# Patient Record
Sex: Female | Born: 1945
Health system: Southern US, Community
[De-identification: ages and names within clinical notes are randomized; demographics above are authoritative.]

## PROBLEM LIST (undated history)

## (undated) DIAGNOSIS — E119 Type 2 diabetes mellitus without complications: Secondary | ICD-10-CM

## (undated) DIAGNOSIS — I951 Orthostatic hypotension: Secondary | ICD-10-CM

## (undated) DIAGNOSIS — E785 Hyperlipidemia, unspecified: Secondary | ICD-10-CM

## (undated) DIAGNOSIS — E079 Disorder of thyroid, unspecified: Secondary | ICD-10-CM

## (undated) HISTORY — PX: CARPAL TUNNEL RELEASE: SHX101

## (undated) HISTORY — DX: Orthostatic hypotension: I95.1

## (undated) HISTORY — PX: OTHER SURGICAL HISTORY: SHX169

## (undated) HISTORY — DX: Disorder of thyroid, unspecified: E07.9

## (undated) HISTORY — PX: GALLBLADDER SURGERY: SHX652

## (undated) HISTORY — PX: CERVICAL FUSION: SHX112

## (undated) HISTORY — DX: Hyperlipidemia, unspecified: E78.5

## (undated) HISTORY — PX: ABDOMINAL HYSTERECTOMY: SHX81

## (undated) HISTORY — PX: THYROIDECTOMY, PARTIAL: SHX18

## (undated) HISTORY — PX: CATARACT EXTRACTION, BILATERAL: SHX1313

---

## 2011-05-11 DIAGNOSIS — Z1212 Encounter for screening for malignant neoplasm of rectum: Secondary | ICD-10-CM | POA: Diagnosis not present

## 2011-05-11 DIAGNOSIS — A499 Bacterial infection, unspecified: Secondary | ICD-10-CM | POA: Diagnosis not present

## 2011-05-11 DIAGNOSIS — J209 Acute bronchitis, unspecified: Secondary | ICD-10-CM | POA: Diagnosis not present

## 2011-05-11 DIAGNOSIS — B9689 Other specified bacterial agents as the cause of diseases classified elsewhere: Secondary | ICD-10-CM | POA: Diagnosis not present

## 2011-05-11 DIAGNOSIS — J449 Chronic obstructive pulmonary disease, unspecified: Secondary | ICD-10-CM | POA: Diagnosis not present

## 2011-06-19 DIAGNOSIS — F339 Major depressive disorder, recurrent, unspecified: Secondary | ICD-10-CM | POA: Diagnosis not present

## 2011-07-17 DIAGNOSIS — F339 Major depressive disorder, recurrent, unspecified: Secondary | ICD-10-CM | POA: Diagnosis not present

## 2011-08-21 DIAGNOSIS — F339 Major depressive disorder, recurrent, unspecified: Secondary | ICD-10-CM | POA: Diagnosis not present

## 2011-08-21 DIAGNOSIS — B351 Tinea unguium: Secondary | ICD-10-CM | POA: Diagnosis not present

## 2011-08-21 DIAGNOSIS — M79609 Pain in unspecified limb: Secondary | ICD-10-CM | POA: Diagnosis not present

## 2011-10-02 DIAGNOSIS — F339 Major depressive disorder, recurrent, unspecified: Secondary | ICD-10-CM | POA: Diagnosis not present

## 2011-10-30 DIAGNOSIS — F339 Major depressive disorder, recurrent, unspecified: Secondary | ICD-10-CM | POA: Diagnosis not present

## 2011-11-27 DIAGNOSIS — F339 Major depressive disorder, recurrent, unspecified: Secondary | ICD-10-CM | POA: Diagnosis not present

## 2012-01-02 DIAGNOSIS — M25569 Pain in unspecified knee: Secondary | ICD-10-CM | POA: Diagnosis not present

## 2012-01-02 DIAGNOSIS — IMO0002 Reserved for concepts with insufficient information to code with codable children: Secondary | ICD-10-CM | POA: Diagnosis not present

## 2012-01-02 DIAGNOSIS — M1710 Unilateral primary osteoarthritis, unspecified knee: Secondary | ICD-10-CM | POA: Diagnosis not present

## 2012-01-02 DIAGNOSIS — M7120 Synovial cyst of popliteal space [Baker], unspecified knee: Secondary | ICD-10-CM | POA: Diagnosis not present

## 2012-01-05 DIAGNOSIS — M7120 Synovial cyst of popliteal space [Baker], unspecified knee: Secondary | ICD-10-CM | POA: Diagnosis not present

## 2012-01-11 DIAGNOSIS — M7120 Synovial cyst of popliteal space [Baker], unspecified knee: Secondary | ICD-10-CM | POA: Diagnosis not present

## 2012-02-20 DIAGNOSIS — T84498A Other mechanical complication of other internal orthopedic devices, implants and grafts, initial encounter: Secondary | ICD-10-CM | POA: Diagnosis not present

## 2012-02-20 DIAGNOSIS — Y939 Activity, unspecified: Secondary | ICD-10-CM | POA: Diagnosis not present

## 2012-02-20 DIAGNOSIS — M79609 Pain in unspecified limb: Secondary | ICD-10-CM | POA: Diagnosis not present

## 2012-02-20 DIAGNOSIS — M1710 Unilateral primary osteoarthritis, unspecified knee: Secondary | ICD-10-CM | POA: Diagnosis not present

## 2012-02-23 DIAGNOSIS — M1710 Unilateral primary osteoarthritis, unspecified knee: Secondary | ICD-10-CM | POA: Diagnosis not present

## 2012-02-26 DIAGNOSIS — Q742 Other congenital malformations of lower limb(s), including pelvic girdle: Secondary | ICD-10-CM | POA: Diagnosis not present

## 2012-02-26 DIAGNOSIS — M25579 Pain in unspecified ankle and joints of unspecified foot: Secondary | ICD-10-CM | POA: Diagnosis not present

## 2012-03-05 DIAGNOSIS — M25579 Pain in unspecified ankle and joints of unspecified foot: Secondary | ICD-10-CM | POA: Diagnosis not present

## 2012-03-07 DIAGNOSIS — E785 Hyperlipidemia, unspecified: Secondary | ICD-10-CM | POA: Diagnosis not present

## 2012-03-07 DIAGNOSIS — M79609 Pain in unspecified limb: Secondary | ICD-10-CM | POA: Diagnosis not present

## 2012-03-07 DIAGNOSIS — Z01810 Encounter for preprocedural cardiovascular examination: Secondary | ICD-10-CM | POA: Diagnosis not present

## 2012-03-07 DIAGNOSIS — I10 Essential (primary) hypertension: Secondary | ICD-10-CM | POA: Diagnosis not present

## 2012-03-07 DIAGNOSIS — R9431 Abnormal electrocardiogram [ECG] [EKG]: Secondary | ICD-10-CM | POA: Diagnosis not present

## 2012-03-07 DIAGNOSIS — Z01818 Encounter for other preprocedural examination: Secondary | ICD-10-CM | POA: Diagnosis not present

## 2012-03-11 DIAGNOSIS — M25579 Pain in unspecified ankle and joints of unspecified foot: Secondary | ICD-10-CM | POA: Diagnosis not present

## 2012-03-11 DIAGNOSIS — Z01818 Encounter for other preprocedural examination: Secondary | ICD-10-CM | POA: Diagnosis not present

## 2012-03-11 DIAGNOSIS — Z6841 Body Mass Index (BMI) 40.0 and over, adult: Secondary | ICD-10-CM | POA: Diagnosis not present

## 2012-03-14 DIAGNOSIS — Z472 Encounter for removal of internal fixation device: Secondary | ICD-10-CM | POA: Diagnosis not present

## 2012-03-14 DIAGNOSIS — M25579 Pain in unspecified ankle and joints of unspecified foot: Secondary | ICD-10-CM | POA: Diagnosis not present

## 2012-03-14 DIAGNOSIS — Z6839 Body mass index (BMI) 39.0-39.9, adult: Secondary | ICD-10-CM | POA: Diagnosis not present

## 2012-03-14 DIAGNOSIS — F17200 Nicotine dependence, unspecified, uncomplicated: Secondary | ICD-10-CM | POA: Diagnosis not present

## 2012-03-14 DIAGNOSIS — F3289 Other specified depressive episodes: Secondary | ICD-10-CM | POA: Diagnosis not present

## 2012-03-14 DIAGNOSIS — J45909 Unspecified asthma, uncomplicated: Secondary | ICD-10-CM | POA: Diagnosis not present

## 2012-03-14 DIAGNOSIS — F329 Major depressive disorder, single episode, unspecified: Secondary | ICD-10-CM | POA: Diagnosis not present

## 2012-03-14 DIAGNOSIS — I10 Essential (primary) hypertension: Secondary | ICD-10-CM | POA: Diagnosis not present

## 2012-03-14 DIAGNOSIS — E669 Obesity, unspecified: Secondary | ICD-10-CM | POA: Diagnosis not present

## 2012-03-14 DIAGNOSIS — T84498A Other mechanical complication of other internal orthopedic devices, implants and grafts, initial encounter: Secondary | ICD-10-CM | POA: Diagnosis not present

## 2012-03-14 DIAGNOSIS — J449 Chronic obstructive pulmonary disease, unspecified: Secondary | ICD-10-CM | POA: Diagnosis not present

## 2012-03-14 DIAGNOSIS — F411 Generalized anxiety disorder: Secondary | ICD-10-CM | POA: Diagnosis not present

## 2012-03-14 DIAGNOSIS — M79609 Pain in unspecified limb: Secondary | ICD-10-CM | POA: Diagnosis not present

## 2012-03-27 DIAGNOSIS — T84498A Other mechanical complication of other internal orthopedic devices, implants and grafts, initial encounter: Secondary | ICD-10-CM | POA: Diagnosis not present

## 2012-05-01 DIAGNOSIS — R42 Dizziness and giddiness: Secondary | ICD-10-CM | POA: Diagnosis not present

## 2012-05-01 DIAGNOSIS — K5732 Diverticulitis of large intestine without perforation or abscess without bleeding: Secondary | ICD-10-CM | POA: Diagnosis not present

## 2012-05-01 DIAGNOSIS — R262 Difficulty in walking, not elsewhere classified: Secondary | ICD-10-CM | POA: Diagnosis not present

## 2012-05-02 DIAGNOSIS — R1032 Left lower quadrant pain: Secondary | ICD-10-CM | POA: Diagnosis not present

## 2012-05-02 DIAGNOSIS — E785 Hyperlipidemia, unspecified: Secondary | ICD-10-CM | POA: Diagnosis not present

## 2012-05-02 DIAGNOSIS — R262 Difficulty in walking, not elsewhere classified: Secondary | ICD-10-CM | POA: Diagnosis not present

## 2012-05-02 DIAGNOSIS — J449 Chronic obstructive pulmonary disease, unspecified: Secondary | ICD-10-CM | POA: Diagnosis not present

## 2012-05-02 DIAGNOSIS — I10 Essential (primary) hypertension: Secondary | ICD-10-CM | POA: Diagnosis not present

## 2012-05-02 DIAGNOSIS — R42 Dizziness and giddiness: Secondary | ICD-10-CM | POA: Diagnosis not present

## 2012-05-02 DIAGNOSIS — G4733 Obstructive sleep apnea (adult) (pediatric): Secondary | ICD-10-CM | POA: Diagnosis not present

## 2012-05-02 DIAGNOSIS — E669 Obesity, unspecified: Secondary | ICD-10-CM | POA: Diagnosis not present

## 2012-05-02 DIAGNOSIS — Z6837 Body mass index (BMI) 37.0-37.9, adult: Secondary | ICD-10-CM | POA: Diagnosis not present

## 2012-05-02 DIAGNOSIS — K5732 Diverticulitis of large intestine without perforation or abscess without bleeding: Secondary | ICD-10-CM | POA: Diagnosis not present

## 2012-05-10 DIAGNOSIS — K5732 Diverticulitis of large intestine without perforation or abscess without bleeding: Secondary | ICD-10-CM | POA: Diagnosis not present

## 2012-05-10 DIAGNOSIS — Z8371 Family history of colonic polyps: Secondary | ICD-10-CM | POA: Diagnosis not present

## 2012-05-10 DIAGNOSIS — K5730 Diverticulosis of large intestine without perforation or abscess without bleeding: Secondary | ICD-10-CM | POA: Diagnosis not present

## 2012-05-20 DIAGNOSIS — F339 Major depressive disorder, recurrent, unspecified: Secondary | ICD-10-CM | POA: Diagnosis not present

## 2012-06-17 DIAGNOSIS — F339 Major depressive disorder, recurrent, unspecified: Secondary | ICD-10-CM | POA: Diagnosis not present

## 2012-07-12 DIAGNOSIS — Z860100 Personal history of colon polyps, unspecified: Secondary | ICD-10-CM | POA: Diagnosis not present

## 2012-07-12 DIAGNOSIS — E669 Obesity, unspecified: Secondary | ICD-10-CM | POA: Diagnosis not present

## 2012-07-12 DIAGNOSIS — Z800 Family history of malignant neoplasm of digestive organs: Secondary | ICD-10-CM | POA: Diagnosis not present

## 2012-07-12 DIAGNOSIS — K648 Other hemorrhoids: Secondary | ICD-10-CM | POA: Diagnosis not present

## 2012-07-12 DIAGNOSIS — K5730 Diverticulosis of large intestine without perforation or abscess without bleeding: Secondary | ICD-10-CM | POA: Diagnosis not present

## 2012-07-12 DIAGNOSIS — I10 Essential (primary) hypertension: Secondary | ICD-10-CM | POA: Diagnosis not present

## 2012-07-12 DIAGNOSIS — F17200 Nicotine dependence, unspecified, uncomplicated: Secondary | ICD-10-CM | POA: Diagnosis not present

## 2012-07-12 DIAGNOSIS — D126 Benign neoplasm of colon, unspecified: Secondary | ICD-10-CM | POA: Diagnosis not present

## 2012-07-12 DIAGNOSIS — G4730 Sleep apnea, unspecified: Secondary | ICD-10-CM | POA: Diagnosis not present

## 2012-07-12 DIAGNOSIS — K644 Residual hemorrhoidal skin tags: Secondary | ICD-10-CM | POA: Diagnosis not present

## 2012-07-12 DIAGNOSIS — J449 Chronic obstructive pulmonary disease, unspecified: Secondary | ICD-10-CM | POA: Diagnosis not present

## 2012-07-12 DIAGNOSIS — Z8371 Family history of colonic polyps: Secondary | ICD-10-CM | POA: Diagnosis not present

## 2012-07-12 DIAGNOSIS — Z6838 Body mass index (BMI) 38.0-38.9, adult: Secondary | ICD-10-CM | POA: Diagnosis not present

## 2012-07-12 DIAGNOSIS — M1990 Unspecified osteoarthritis, unspecified site: Secondary | ICD-10-CM | POA: Diagnosis not present

## 2012-07-29 DIAGNOSIS — F339 Major depressive disorder, recurrent, unspecified: Secondary | ICD-10-CM | POA: Diagnosis not present

## 2012-08-16 DIAGNOSIS — K5730 Diverticulosis of large intestine without perforation or abscess without bleeding: Secondary | ICD-10-CM | POA: Diagnosis not present

## 2012-08-16 DIAGNOSIS — K649 Unspecified hemorrhoids: Secondary | ICD-10-CM | POA: Diagnosis not present

## 2012-08-16 DIAGNOSIS — D126 Benign neoplasm of colon, unspecified: Secondary | ICD-10-CM | POA: Diagnosis not present

## 2012-08-26 DIAGNOSIS — F339 Major depressive disorder, recurrent, unspecified: Secondary | ICD-10-CM | POA: Diagnosis not present

## 2012-09-30 DIAGNOSIS — F339 Major depressive disorder, recurrent, unspecified: Secondary | ICD-10-CM | POA: Diagnosis not present

## 2012-10-28 DIAGNOSIS — K5732 Diverticulitis of large intestine without perforation or abscess without bleeding: Secondary | ICD-10-CM | POA: Diagnosis not present

## 2012-10-28 DIAGNOSIS — R1032 Left lower quadrant pain: Secondary | ICD-10-CM | POA: Diagnosis not present

## 2012-10-28 DIAGNOSIS — R3129 Other microscopic hematuria: Secondary | ICD-10-CM | POA: Diagnosis not present

## 2012-10-28 DIAGNOSIS — I10 Essential (primary) hypertension: Secondary | ICD-10-CM | POA: Diagnosis not present

## 2012-11-25 DIAGNOSIS — F339 Major depressive disorder, recurrent, unspecified: Secondary | ICD-10-CM | POA: Diagnosis not present

## 2012-12-30 DIAGNOSIS — F339 Major depressive disorder, recurrent, unspecified: Secondary | ICD-10-CM | POA: Diagnosis not present

## 2013-01-19 DIAGNOSIS — M25529 Pain in unspecified elbow: Secondary | ICD-10-CM | POA: Diagnosis not present

## 2013-01-19 DIAGNOSIS — IMO0001 Reserved for inherently not codable concepts without codable children: Secondary | ICD-10-CM | POA: Diagnosis not present

## 2013-01-27 DIAGNOSIS — F339 Major depressive disorder, recurrent, unspecified: Secondary | ICD-10-CM | POA: Diagnosis not present

## 2013-02-12 DIAGNOSIS — Z23 Encounter for immunization: Secondary | ICD-10-CM | POA: Diagnosis not present

## 2013-02-24 DIAGNOSIS — F339 Major depressive disorder, recurrent, unspecified: Secondary | ICD-10-CM | POA: Diagnosis not present

## 2013-03-31 DIAGNOSIS — F339 Major depressive disorder, recurrent, unspecified: Secondary | ICD-10-CM | POA: Diagnosis not present

## 2013-05-13 DIAGNOSIS — Y999 Unspecified external cause status: Secondary | ICD-10-CM | POA: Diagnosis not present

## 2013-05-13 DIAGNOSIS — W010XXA Fall on same level from slipping, tripping and stumbling without subsequent striking against object, initial encounter: Secondary | ICD-10-CM | POA: Diagnosis not present

## 2013-05-13 DIAGNOSIS — F17200 Nicotine dependence, unspecified, uncomplicated: Secondary | ICD-10-CM | POA: Diagnosis not present

## 2013-05-13 DIAGNOSIS — S6390XA Sprain of unspecified part of unspecified wrist and hand, initial encounter: Secondary | ICD-10-CM | POA: Diagnosis not present

## 2013-05-14 DIAGNOSIS — S6390XA Sprain of unspecified part of unspecified wrist and hand, initial encounter: Secondary | ICD-10-CM | POA: Diagnosis not present

## 2013-07-21 DIAGNOSIS — F339 Major depressive disorder, recurrent, unspecified: Secondary | ICD-10-CM | POA: Diagnosis not present

## 2013-08-25 DIAGNOSIS — F339 Major depressive disorder, recurrent, unspecified: Secondary | ICD-10-CM | POA: Diagnosis not present

## 2013-09-02 DIAGNOSIS — H34219 Partial retinal artery occlusion, unspecified eye: Secondary | ICD-10-CM | POA: Diagnosis not present

## 2013-09-02 DIAGNOSIS — H2510 Age-related nuclear cataract, unspecified eye: Secondary | ICD-10-CM | POA: Diagnosis not present

## 2013-09-02 DIAGNOSIS — H353190 Nonexudative age-related macular degeneration, unspecified eye, stage unspecified: Secondary | ICD-10-CM | POA: Diagnosis not present

## 2013-09-04 DIAGNOSIS — F3289 Other specified depressive episodes: Secondary | ICD-10-CM | POA: Diagnosis not present

## 2013-09-04 DIAGNOSIS — I2510 Atherosclerotic heart disease of native coronary artery without angina pectoris: Secondary | ICD-10-CM | POA: Diagnosis not present

## 2013-09-04 DIAGNOSIS — IMO0002 Reserved for concepts with insufficient information to code with codable children: Secondary | ICD-10-CM | POA: Diagnosis not present

## 2013-09-04 DIAGNOSIS — F17200 Nicotine dependence, unspecified, uncomplicated: Secondary | ICD-10-CM | POA: Diagnosis not present

## 2013-09-04 DIAGNOSIS — M25579 Pain in unspecified ankle and joints of unspecified foot: Secondary | ICD-10-CM | POA: Diagnosis not present

## 2013-09-04 DIAGNOSIS — E041 Nontoxic single thyroid nodule: Secondary | ICD-10-CM | POA: Diagnosis not present

## 2013-09-04 DIAGNOSIS — F329 Major depressive disorder, single episode, unspecified: Secondary | ICD-10-CM | POA: Diagnosis not present

## 2013-09-04 DIAGNOSIS — F411 Generalized anxiety disorder: Secondary | ICD-10-CM | POA: Diagnosis not present

## 2013-09-04 DIAGNOSIS — J449 Chronic obstructive pulmonary disease, unspecified: Secondary | ICD-10-CM | POA: Diagnosis not present

## 2013-09-06 DIAGNOSIS — I2510 Atherosclerotic heart disease of native coronary artery without angina pectoris: Secondary | ICD-10-CM | POA: Diagnosis not present

## 2013-09-06 DIAGNOSIS — Z833 Family history of diabetes mellitus: Secondary | ICD-10-CM | POA: Diagnosis not present

## 2013-09-06 DIAGNOSIS — Z0000 Encounter for general adult medical examination without abnormal findings: Secondary | ICD-10-CM | POA: Diagnosis not present

## 2013-09-06 DIAGNOSIS — E041 Nontoxic single thyroid nodule: Secondary | ICD-10-CM | POA: Diagnosis not present

## 2013-09-06 DIAGNOSIS — R7989 Other specified abnormal findings of blood chemistry: Secondary | ICD-10-CM | POA: Diagnosis not present

## 2013-09-08 DIAGNOSIS — H34219 Partial retinal artery occlusion, unspecified eye: Secondary | ICD-10-CM | POA: Diagnosis not present

## 2013-09-09 DIAGNOSIS — E783 Hyperchylomicronemia: Secondary | ICD-10-CM | POA: Diagnosis not present

## 2013-09-09 DIAGNOSIS — J449 Chronic obstructive pulmonary disease, unspecified: Secondary | ICD-10-CM | POA: Diagnosis not present

## 2013-09-09 DIAGNOSIS — R0602 Shortness of breath: Secondary | ICD-10-CM | POA: Diagnosis not present

## 2013-09-09 DIAGNOSIS — R002 Palpitations: Secondary | ICD-10-CM | POA: Diagnosis not present

## 2013-09-17 DIAGNOSIS — I2510 Atherosclerotic heart disease of native coronary artery without angina pectoris: Secondary | ICD-10-CM | POA: Diagnosis not present

## 2013-09-19 DIAGNOSIS — R0602 Shortness of breath: Secondary | ICD-10-CM | POA: Diagnosis not present

## 2013-09-22 DIAGNOSIS — F339 Major depressive disorder, recurrent, unspecified: Secondary | ICD-10-CM | POA: Diagnosis not present

## 2013-09-25 DIAGNOSIS — R0602 Shortness of breath: Secondary | ICD-10-CM | POA: Diagnosis not present

## 2013-09-25 DIAGNOSIS — E669 Obesity, unspecified: Secondary | ICD-10-CM | POA: Diagnosis not present

## 2013-09-25 DIAGNOSIS — J449 Chronic obstructive pulmonary disease, unspecified: Secondary | ICD-10-CM | POA: Diagnosis not present

## 2013-09-30 DIAGNOSIS — F411 Generalized anxiety disorder: Secondary | ICD-10-CM | POA: Diagnosis not present

## 2013-09-30 DIAGNOSIS — F3289 Other specified depressive episodes: Secondary | ICD-10-CM | POA: Diagnosis not present

## 2013-09-30 DIAGNOSIS — E041 Nontoxic single thyroid nodule: Secondary | ICD-10-CM | POA: Diagnosis not present

## 2013-09-30 DIAGNOSIS — I2510 Atherosclerotic heart disease of native coronary artery without angina pectoris: Secondary | ICD-10-CM | POA: Diagnosis not present

## 2013-09-30 DIAGNOSIS — F329 Major depressive disorder, single episode, unspecified: Secondary | ICD-10-CM | POA: Diagnosis not present

## 2013-09-30 DIAGNOSIS — Z0000 Encounter for general adult medical examination without abnormal findings: Secondary | ICD-10-CM | POA: Diagnosis not present

## 2013-09-30 DIAGNOSIS — H34219 Partial retinal artery occlusion, unspecified eye: Secondary | ICD-10-CM | POA: Diagnosis not present

## 2013-09-30 DIAGNOSIS — F17200 Nicotine dependence, unspecified, uncomplicated: Secondary | ICD-10-CM | POA: Diagnosis not present

## 2013-09-30 DIAGNOSIS — J449 Chronic obstructive pulmonary disease, unspecified: Secondary | ICD-10-CM | POA: Diagnosis not present

## 2013-10-02 DIAGNOSIS — Z1231 Encounter for screening mammogram for malignant neoplasm of breast: Secondary | ICD-10-CM | POA: Diagnosis not present

## 2013-10-06 DIAGNOSIS — E1149 Type 2 diabetes mellitus with other diabetic neurological complication: Secondary | ICD-10-CM | POA: Diagnosis not present

## 2013-10-06 DIAGNOSIS — B351 Tinea unguium: Secondary | ICD-10-CM | POA: Diagnosis not present

## 2013-10-20 DIAGNOSIS — F339 Major depressive disorder, recurrent, unspecified: Secondary | ICD-10-CM | POA: Diagnosis not present

## 2013-11-17 DIAGNOSIS — F339 Major depressive disorder, recurrent, unspecified: Secondary | ICD-10-CM | POA: Diagnosis not present

## 2013-12-03 DIAGNOSIS — H2510 Age-related nuclear cataract, unspecified eye: Secondary | ICD-10-CM | POA: Diagnosis not present

## 2013-12-08 DIAGNOSIS — B351 Tinea unguium: Secondary | ICD-10-CM | POA: Diagnosis not present

## 2013-12-08 DIAGNOSIS — E1149 Type 2 diabetes mellitus with other diabetic neurological complication: Secondary | ICD-10-CM | POA: Diagnosis not present

## 2013-12-15 DIAGNOSIS — F339 Major depressive disorder, recurrent, unspecified: Secondary | ICD-10-CM | POA: Diagnosis not present

## 2013-12-20 DIAGNOSIS — S8010XA Contusion of unspecified lower leg, initial encounter: Secondary | ICD-10-CM | POA: Diagnosis not present

## 2013-12-20 DIAGNOSIS — F17200 Nicotine dependence, unspecified, uncomplicated: Secondary | ICD-10-CM | POA: Diagnosis not present

## 2013-12-20 DIAGNOSIS — IMO0002 Reserved for concepts with insufficient information to code with codable children: Secondary | ICD-10-CM | POA: Diagnosis not present

## 2013-12-31 DIAGNOSIS — Z01818 Encounter for other preprocedural examination: Secondary | ICD-10-CM | POA: Diagnosis not present

## 2013-12-31 DIAGNOSIS — Z6841 Body Mass Index (BMI) 40.0 and over, adult: Secondary | ICD-10-CM | POA: Diagnosis not present

## 2013-12-31 DIAGNOSIS — H2510 Age-related nuclear cataract, unspecified eye: Secondary | ICD-10-CM | POA: Diagnosis not present

## 2014-01-05 DIAGNOSIS — H2510 Age-related nuclear cataract, unspecified eye: Secondary | ICD-10-CM | POA: Diagnosis not present

## 2014-01-07 DIAGNOSIS — F329 Major depressive disorder, single episode, unspecified: Secondary | ICD-10-CM | POA: Diagnosis not present

## 2014-01-07 DIAGNOSIS — F411 Generalized anxiety disorder: Secondary | ICD-10-CM | POA: Diagnosis not present

## 2014-01-07 DIAGNOSIS — F17200 Nicotine dependence, unspecified, uncomplicated: Secondary | ICD-10-CM | POA: Diagnosis not present

## 2014-01-07 DIAGNOSIS — J441 Chronic obstructive pulmonary disease with (acute) exacerbation: Secondary | ICD-10-CM | POA: Diagnosis not present

## 2014-01-07 DIAGNOSIS — J984 Other disorders of lung: Secondary | ICD-10-CM | POA: Diagnosis not present

## 2014-01-07 DIAGNOSIS — R0602 Shortness of breath: Secondary | ICD-10-CM | POA: Diagnosis not present

## 2014-01-07 DIAGNOSIS — E7800 Pure hypercholesterolemia, unspecified: Secondary | ICD-10-CM | POA: Diagnosis not present

## 2014-01-07 DIAGNOSIS — F3289 Other specified depressive episodes: Secondary | ICD-10-CM | POA: Diagnosis not present

## 2014-01-07 DIAGNOSIS — I10 Essential (primary) hypertension: Secondary | ICD-10-CM | POA: Diagnosis not present

## 2014-01-07 DIAGNOSIS — J209 Acute bronchitis, unspecified: Secondary | ICD-10-CM | POA: Diagnosis not present

## 2014-01-07 DIAGNOSIS — R6883 Chills (without fever): Secondary | ICD-10-CM | POA: Diagnosis not present

## 2014-01-07 DIAGNOSIS — J440 Chronic obstructive pulmonary disease with acute lower respiratory infection: Secondary | ICD-10-CM | POA: Diagnosis not present

## 2014-01-12 DIAGNOSIS — Z6841 Body Mass Index (BMI) 40.0 and over, adult: Secondary | ICD-10-CM | POA: Diagnosis not present

## 2014-01-12 DIAGNOSIS — J449 Chronic obstructive pulmonary disease, unspecified: Secondary | ICD-10-CM | POA: Diagnosis not present

## 2014-01-12 DIAGNOSIS — F17200 Nicotine dependence, unspecified, uncomplicated: Secondary | ICD-10-CM | POA: Diagnosis not present

## 2014-01-19 DIAGNOSIS — F339 Major depressive disorder, recurrent, unspecified: Secondary | ICD-10-CM | POA: Diagnosis not present

## 2014-01-26 DIAGNOSIS — F339 Major depressive disorder, recurrent, unspecified: Secondary | ICD-10-CM | POA: Diagnosis not present

## 2014-01-28 DIAGNOSIS — R7301 Impaired fasting glucose: Secondary | ICD-10-CM | POA: Diagnosis not present

## 2014-01-28 DIAGNOSIS — E780 Pure hypercholesterolemia: Secondary | ICD-10-CM | POA: Diagnosis not present

## 2014-01-28 LAB — LIPID PANEL
CHOLESTEROL: 162 mg/dL (ref 0–200)
HDL: 61 mg/dL (ref 35–70)
LDL Cholesterol: 83 mg/dL
Triglycerides: 90 mg/dL (ref 40–160)

## 2014-01-28 LAB — HEMOGLOBIN A1C: Hgb A1c MFr Bld: 6.8 % — AB (ref 4.0–6.0)

## 2014-01-28 LAB — BASIC METABOLIC PANEL
Creatinine: 0.8 mg/dL (ref 0.5–1.1)
Glucose: 141 mg/dL
Potassium: 4.6 mmol/L (ref 3.4–5.3)

## 2014-01-28 LAB — HEPATIC FUNCTION PANEL
ALT: 29 U/L (ref 7–35)
AST: 13 U/L (ref 13–35)

## 2014-01-28 LAB — TSH: TSH: 0.82 u[IU]/mL (ref 0.41–5.90)

## 2014-01-30 DIAGNOSIS — F411 Generalized anxiety disorder: Secondary | ICD-10-CM | POA: Diagnosis not present

## 2014-01-30 DIAGNOSIS — I2510 Atherosclerotic heart disease of native coronary artery without angina pectoris: Secondary | ICD-10-CM | POA: Diagnosis not present

## 2014-01-30 DIAGNOSIS — F17210 Nicotine dependence, cigarettes, uncomplicated: Secondary | ICD-10-CM | POA: Diagnosis not present

## 2014-01-30 DIAGNOSIS — E278 Other specified disorders of adrenal gland: Secondary | ICD-10-CM | POA: Diagnosis not present

## 2014-01-30 DIAGNOSIS — E780 Pure hypercholesterolemia: Secondary | ICD-10-CM | POA: Diagnosis not present

## 2014-01-30 DIAGNOSIS — J449 Chronic obstructive pulmonary disease, unspecified: Secondary | ICD-10-CM | POA: Diagnosis not present

## 2014-01-30 DIAGNOSIS — F329 Major depressive disorder, single episode, unspecified: Secondary | ICD-10-CM | POA: Diagnosis not present

## 2014-01-30 DIAGNOSIS — H2513 Age-related nuclear cataract, bilateral: Secondary | ICD-10-CM | POA: Diagnosis not present

## 2014-02-09 DIAGNOSIS — B351 Tinea unguium: Secondary | ICD-10-CM | POA: Diagnosis not present

## 2014-02-09 DIAGNOSIS — E1049 Type 1 diabetes mellitus with other diabetic neurological complication: Secondary | ICD-10-CM | POA: Diagnosis not present

## 2014-02-19 DIAGNOSIS — F339 Major depressive disorder, recurrent, unspecified: Secondary | ICD-10-CM | POA: Diagnosis not present

## 2014-02-20 DIAGNOSIS — Z23 Encounter for immunization: Secondary | ICD-10-CM | POA: Diagnosis not present

## 2014-03-16 ENCOUNTER — Ambulatory Visit (INDEPENDENT_AMBULATORY_CARE_PROVIDER_SITE_OTHER): Payer: Medicare Other | Admitting: Family Medicine

## 2014-03-16 ENCOUNTER — Encounter: Payer: Self-pay | Admitting: Family Medicine

## 2014-03-16 ENCOUNTER — Ambulatory Visit (INDEPENDENT_AMBULATORY_CARE_PROVIDER_SITE_OTHER): Payer: Medicare Other

## 2014-03-16 VITALS — BP 110/64 | HR 70 | Ht 66.0 in | Wt 251.0 lb

## 2014-03-16 DIAGNOSIS — E785 Hyperlipidemia, unspecified: Secondary | ICD-10-CM | POA: Insufficient documentation

## 2014-03-16 DIAGNOSIS — M79642 Pain in left hand: Secondary | ICD-10-CM | POA: Diagnosis not present

## 2014-03-16 DIAGNOSIS — R609 Edema, unspecified: Secondary | ICD-10-CM

## 2014-03-16 DIAGNOSIS — K219 Gastro-esophageal reflux disease without esophagitis: Secondary | ICD-10-CM | POA: Insufficient documentation

## 2014-03-16 DIAGNOSIS — G4700 Insomnia, unspecified: Secondary | ICD-10-CM | POA: Insufficient documentation

## 2014-03-16 DIAGNOSIS — F329 Major depressive disorder, single episode, unspecified: Secondary | ICD-10-CM

## 2014-03-16 DIAGNOSIS — F32A Depression, unspecified: Secondary | ICD-10-CM | POA: Insufficient documentation

## 2014-03-16 DIAGNOSIS — F418 Other specified anxiety disorders: Secondary | ICD-10-CM

## 2014-03-16 DIAGNOSIS — M5136 Other intervertebral disc degeneration, lumbar region: Secondary | ICD-10-CM | POA: Diagnosis not present

## 2014-03-16 DIAGNOSIS — M4316 Spondylolisthesis, lumbar region: Secondary | ICD-10-CM

## 2014-03-16 DIAGNOSIS — J449 Chronic obstructive pulmonary disease, unspecified: Secondary | ICD-10-CM | POA: Insufficient documentation

## 2014-03-16 DIAGNOSIS — F419 Anxiety disorder, unspecified: Principal | ICD-10-CM

## 2014-03-16 DIAGNOSIS — M79641 Pain in right hand: Secondary | ICD-10-CM | POA: Insufficient documentation

## 2014-03-16 MED ORDER — TIOTROPIUM BROMIDE MONOHYDRATE 18 MCG IN CAPS: 18.0000 ug | ORAL_CAPSULE | Freq: Every day | RESPIRATORY_TRACT | Status: DC

## 2014-03-16 NOTE — Progress Notes (Signed)
CC: Sarah Wright is a 68 y.o. female is here for Establish Care   Subjective: HPI:  Pleasant 68 year old here to establish care, daughter of Sarah Wright  Reports a history of anxiety and depression currently she is taking Wellbutrin and citalopram and Zyprexa. It's uncertain how long she's been on these medications but she states that she gets moderate relief from them and also has been helpful but she recently moved in with her daughter. In the past she has taken Xanax for anxiety, currently taking clonazepam. No thoughts of going to harm herself or others  She reports a history of hyperlipidemia currently taking atorvastatin without any right upper quadrant pain. She is uncertain when her last lipid panel was done but she believes it was done within the last year. She has no history of heart disease or vascular disease  COPD: She quit smoking last month. She was hospitalized last month for COPD exacerbation requiring oxygen. She is currently taking Symbicort on a daily basis and Spiriva. She believes that her breathing is better now that she stop smoking and denies any wheezing or cough.  Her major complaint today is edema in both lower extremities and has been present for the last 6 months. It's present on a daily basis and has been slowly worsening. It is improved first thing in the morning but within a few hours it starts coming more noticeable and is at its worst in the evening. It has always been symmetric and never involves any other appendage. She denies ever being told she has renal insufficiency. She had an echocardiogram in the last year that was reportedly normal which was done because of an abnormal EKG.  She denies any shortness of breath or orthopnea  Complains of chronic bilateral hand pain described as a stiffness localized in the index and thumb bilaterally. Mild to moderate in severity becoming only moderate in severity 1-2 times a week. Nothing particularly makes it better  or worse. She denies any swelling redness or warmth of either hand  She complains of chronic lower back pain localized in the midline with occasional radiation down the left and right leg. Nothing particularly makes it better, it is worse with walking more than the asile of a supermarket. No interventions currently. Other than x-rays that day for a diagnosis of degenerative disc disease she denies any other intervention. It is moderately to severely limiting her quality of life and IADLs. Denies saddle paresthesias or bowel or bladder incontinence   Review of Systems - General ROS: negative for - chills, fever, night sweats, weight gain or weight loss Ophthalmic ROS: negative for - decreased vision Psychological ROS: negative for - uncontrolled anxiety or depression ENT ROS: negative for - hearing change, nasal congestion, tinnitus or allergies Hematological and Lymphatic ROS: negative for - bleeding problems, bruising or swollen lymph nodes Breast ROS: negative Respiratory ROS: no cough, shortness of breath, or wheezing Cardiovascular ROS: no chest pain or dyspnea on exertion Gastrointestinal ROS: no abdominal pain, change in bowel habits, or black or bloody stools Genito-Urinary ROS: negative for - genital discharge, genital ulcers, incontinence or abnormal bleeding from genitals Musculoskeletal ROS: negative for - joint pain or muscle pain other than that described above Neurological ROS: negative for - headaches or memory loss Dermatological ROS: negative for lumps, mole changes, rash and skin lesion changes  Past Medical History  Diagnosis Date  . Hyperlipidemia   . Thyroid disease     Past Surgical History  Procedure Laterality Date  .  Thyroidectomy, partial      right side removed  . Abdominal hysterectomy    . Cervical fusion    . Carpal tunnel release      both wrists  . Gallbladder surgery    . Right foot surgery    . Cataract extraction, bilateral     Family History   Problem Relation Age of Onset  . Uterine cancer    . Pancreatic cancer    . Heart attack Father   . Depression Mother   . Diabetes Father   . Hypertension      parents    History   Social History  . Marital Status: Divorced    Spouse Name: N/A    Number of Children: N/A  . Years of Education: N/A   Occupational History  . Not on file.   Social History Main Topics  . Smoking status: Former Smoker    Quit date: 02/12/2014  . Smokeless tobacco: Not on file  . Alcohol Use: No  . Drug Use: No  . Sexual Activity: Not Currently   Other Topics Concern  . Not on file   Social History Narrative  . No narrative on file     Objective: BP 110/64 mmHg  Pulse 70  Ht 5\' 6"  (1.676 m)  Wt 251 lb (113.853 kg)  BMI 40.53 kg/m2  General: Alert and Oriented, No Acute Distress HEENT: Pupils equal, round, reactive to light. Conjunctivae clear.  Moist mucous membranes unremarkable Lungs: Clear to auscultation bilaterally, no wheezing/ronchi/rales.  Comfortable work of breathing. Good air movement. Cardiac: Regular rate and rhythm. Normal S1/S2.  No murmurs, rubs, nor gallops.   Abdomen: Obese and soft Extremities: 1+ nonpitting edema from just above the ankles distally, symmetric.  Strong peripheral pulses.  No swelling redness or warmth of any of the hand joints including the wrists distally. Mental Status: No depression, anxiety, nor agitation. Skin: Warm and dry.  Assessment & Plan: [name] was seen today for establish care.  Diagnoses and associated orders for this visit:  Anxiety and depression  Hyperlipidemia  Chronic obstructive pulmonary disease, unspecified COPD, unspecified chronic bronchitis type - tiotropium (SPIRIVA) 18 MCG inhalation capsule; Place 1 capsule (18 mcg total) into inhaler and inhale daily.  Edema - COMPLETE METABOLIC PANEL WITH GFR  Bilateral hand pain  DDD (degenerative disc disease), lumbar - DG Lumbar Spine Complete; Future    Anxiety  and depression: Controlled continue Zyprexa, Wellbutrin, citalopram and as needed clonazepam Hyperlipidemia: Obtaining outside records indicating more than a year we will obtain a lipid panel in the near future COPD: Controlled continue Spiriva and Symbicort congratulated her ability to stop smoking Edema: Discussed most likely venous insufficiency however I would like to rule out renal insufficiency or hypoalbuminemia, probability of heart failure is low however I am also requesting outside records of her recent echocardiogram Degenerative disc disease: It's been almost 40 years and she had an x-ray of her back, obtaining today to rule out vertebral fracture and to see if she is a candidate for injections in the future. Bilateral hand pain: Suspect osteoarthritis, fortunately whenever what her anti-inflammatory ultimately used for her back should help with the pain in the hands as well   Return if symptoms worsen or fail to improve.

## 2014-03-17 ENCOUNTER — Telehealth: Payer: Self-pay | Admitting: Family Medicine

## 2014-03-17 DIAGNOSIS — M47816 Spondylosis without myelopathy or radiculopathy, lumbar region: Secondary | ICD-10-CM | POA: Insufficient documentation

## 2014-03-17 DIAGNOSIS — M4726 Other spondylosis with radiculopathy, lumbar region: Secondary | ICD-10-CM

## 2014-03-17 DIAGNOSIS — I700 Atherosclerosis of aorta: Secondary | ICD-10-CM | POA: Insufficient documentation

## 2014-03-17 LAB — COMPLETE METABOLIC PANEL WITH GFR
ALT: 14 U/L (ref 0–35)
AST: 18 U/L (ref 0–37)
Albumin: 4 g/dL (ref 3.5–5.2)
Alkaline Phosphatase: 113 U/L (ref 39–117)
BILIRUBIN TOTAL: 0.4 mg/dL (ref 0.2–1.2)
BUN: 15 mg/dL (ref 6–23)
CO2: 20 mEq/L (ref 19–32)
Calcium: 9.8 mg/dL (ref 8.4–10.5)
Chloride: 101 mEq/L (ref 96–112)
Creat: 0.9 mg/dL (ref 0.50–1.10)
GFR, EST NON AFRICAN AMERICAN: 65 mL/min
GFR, Est African American: 75 mL/min
GLUCOSE: 117 mg/dL — AB (ref 70–99)
Potassium: 4.5 mEq/L (ref 3.5–5.3)
Sodium: 138 mEq/L (ref 135–145)
Total Protein: 6.8 g/dL (ref 6.0–8.3)

## 2014-03-17 MED ORDER — CELECOXIB 200 MG PO CAPS: 200.0000 mg | ORAL_CAPSULE | Freq: Two times a day (BID) | ORAL | Status: DC | PRN

## 2014-03-17 MED ORDER — TIOTROPIUM BROMIDE MONOHYDRATE 18 MCG IN CAPS: 18.0000 ug | ORAL_CAPSULE | Freq: Every day | RESPIRATORY_TRACT | Status: DC

## 2014-03-17 NOTE — Telephone Encounter (Signed)
Seth Bake, Will you please let patient know that her xrays confirmed that some if not all of her pain in her back is coming from arthritis of her spine where each vertebrae rotates and flexes on its neighbors.  I'd recommend she try celebrex which I've sent to [facility].  If this provides no relief for her low back or hand pain my next recommendation would be to see Dr. Darene Lamer in our sports medicine clinic.

## 2014-03-17 NOTE — Addendum Note (Signed)
Addended by: Narda Rutherford on: 03/17/2014 01:19 PM   Modules accepted: Orders

## 2014-03-17 NOTE — Telephone Encounter (Signed)
Pt's daughter Pamala Hurry was notified

## 2014-03-18 ENCOUNTER — Telehealth: Payer: Self-pay | Admitting: Family Medicine

## 2014-03-18 MED ORDER — FUROSEMIDE 20 MG PO TABS: 20.0000 mg | ORAL_TABLET | Freq: Every day | ORAL | Status: DC | PRN

## 2014-03-18 NOTE — Telephone Encounter (Signed)
Pt's daughter notified.

## 2014-03-18 NOTE — Telephone Encounter (Signed)
Seth Bake, Will you please let patient know that her blood work showed a mild kidney insufficiency but not to a degree that would be the cause of her ankle swelling.  I believe her swelling is from venous insufficiency that we discussed during her visit.  Management of this includes taking a daily as needed diuretic called furosemide that I sent to her [facility] pharmacy.  If this doesn't help I can also provide her with an order for compression stockings.  F/U with me in one month.

## 2014-03-25 DIAGNOSIS — F339 Major depressive disorder, recurrent, unspecified: Secondary | ICD-10-CM | POA: Diagnosis not present

## 2014-03-27 ENCOUNTER — Telehealth: Payer: Self-pay

## 2014-03-27 DIAGNOSIS — I872 Venous insufficiency (chronic) (peripheral): Secondary | ICD-10-CM | POA: Insufficient documentation

## 2014-03-27 MED ORDER — AMBULATORY NON FORMULARY MEDICATION: Status: DC

## 2014-03-27 NOTE — Telephone Encounter (Signed)
Pamala Hurry, Mrs Ninh's daughter, called and reports no improvement on the bilateral leg swelling. I did advise her the next step, per Dr Hommel's note, would be compression stockings and a 1 month follow up. [facility] has compression stocking OTC for around 20 dollars. [facility] sales stockings with a compression range of 20-30 mgHg. Please advise on compression range.

## 2014-03-27 NOTE — Telephone Encounter (Signed)
Ideal range is 20-42mmHg, I'll print off an Rx that they might be able to use at [facility] or [facility] in case [facility] does not have a comfortable pair.

## 2014-03-30 NOTE — Telephone Encounter (Signed)
Patient's daughter advised.  

## 2014-03-31 ENCOUNTER — Encounter: Payer: Self-pay | Admitting: Family Medicine

## 2014-03-31 ENCOUNTER — Ambulatory Visit (INDEPENDENT_AMBULATORY_CARE_PROVIDER_SITE_OTHER): Payer: Medicare Other | Admitting: Family Medicine

## 2014-03-31 VITALS — BP 107/58 | HR 79 | Wt 262.0 lb

## 2014-03-31 DIAGNOSIS — H1089 Other conjunctivitis: Secondary | ICD-10-CM | POA: Diagnosis not present

## 2014-03-31 DIAGNOSIS — I872 Venous insufficiency (chronic) (peripheral): Secondary | ICD-10-CM | POA: Diagnosis not present

## 2014-03-31 DIAGNOSIS — H109 Unspecified conjunctivitis: Secondary | ICD-10-CM

## 2014-03-31 DIAGNOSIS — H269 Unspecified cataract: Secondary | ICD-10-CM | POA: Diagnosis not present

## 2014-03-31 DIAGNOSIS — A499 Bacterial infection, unspecified: Secondary | ICD-10-CM | POA: Diagnosis not present

## 2014-03-31 MED ORDER — POLYMYXIN B-TRIMETHOPRIM 10000-0.1 UNIT/ML-% OP SOLN: 2.0000 [drp] | OPHTHALMIC | Status: DC

## 2014-03-31 NOTE — Progress Notes (Signed)
CC: Sarah Wright is a 68 y.o. female is here for right eye irritation   Subjective: HPI:   right eye irritation and redness that began 3 days ago immediately after she injured herself with the tip of a dry powdered makeup applicator. Irritation has been worsening now mild to moderate severity redness began for the most part this morning. Every morning it seems to have more mucus and is becoming harder to open it up without using her fingers. Interventions have included applying over-the-counter eyedrops which do not seem to make symptoms better or worse. She reports pain is on the service of her eye,non- radiating. It does not hurt to blink and she denies photophobia. She denies any other facial pain or discomfort. She has chronic vision loss due to a cataract in the right eye and she does not believe that her vision has gotten any better or worse since the injury.  Follow-up venous insufficiency: Taking 20 mg of Lasix does not seem to be helping her with edema in the lower extremities. She is not started with compression stockings yet. She denies swelling elsewhere. She denies asymmetry in the swelling. She denies anyside effects from Lasix other that is making her urinate more than average.      Review Of Systems Outlined In HPI  Past Medical History  Diagnosis Date  . Hyperlipidemia   . Thyroid disease     Past Surgical History  Procedure Laterality Date  . Thyroidectomy, partial      right side removed  . Abdominal hysterectomy    . Cervical fusion    . Carpal tunnel release      both wrists  . Gallbladder surgery    . Right foot surgery    . Cataract extraction, bilateral     Family History  Problem Relation Age of Onset  . Uterine cancer    . Pancreatic cancer    . Heart attack Father   . Depression Mother   . Diabetes Father   . Hypertension      parents    History   Social History  . Marital Status: Divorced    Spouse Name: N/A    Number of Children: N/A  .  Years of Education: N/A   Occupational History  . Not on file.   Social History Main Topics  . Smoking status: Former Smoker    Quit date: 02/12/2014  . Smokeless tobacco: Not on file  . Alcohol Use: No  . Drug Use: No  . Sexual Activity: Not Currently   Other Topics Concern  . Not on file   Social History Narrative     Objective: BP 107/58 mmHg  Pulse 79  Wt 262 lb (118.842 kg)  General: Alert and Oriented, No Acute Distress HEENT: Pupils equal, round, reactive to light. LeftConjunctivae clear.  Right conjunctival has moderate erythema in the periphery which improves closer to the limbus. Anterior chamber of both eyes appears open without debris. Flurocen staining of the right eye does not show any surface abnormalities under ultraviolet light.moist mucous membranes Lungs: clear andcomfortable work of breathing Cardiac: Regular rate and rhythm.  Extremities: 1+ nonpitting edema in both lower extremities confined to the ankles.  Strong peripheral pulses.  Mental Status: No depression, anxiety, nor agitation. Skin: Warm and dry.  Assessment & Plan: Dilyn was seen today for right eye irritation.  Diagnoses and associated orders for this visit:  Bacterial conjunctivitis - trimethoprim-polymyxin b (POLYTRIM) ophthalmic solution; Place 2 drops into both eyes every  4 (four) hours. For ten days.  Chronic venous insufficiency  Cataract - Ambulatory referral to Ophthalmology    Bacterial conjunctivitis start Polytrim.Signs and symptoms requring emergent/urgent reevaluation were discussed with the patient. Chronic venous insufficiency: Uncontrolled increasing Lasix now 40 mg every morning. Cataract: referral to ophthalmology  Return if symptoms worsen or fail to improve.

## 2014-04-02 ENCOUNTER — Encounter: Payer: Self-pay | Admitting: Family Medicine

## 2014-04-02 DIAGNOSIS — H25811 Combined forms of age-related cataract, right eye: Secondary | ICD-10-CM | POA: Diagnosis not present

## 2014-04-02 DIAGNOSIS — E119 Type 2 diabetes mellitus without complications: Secondary | ICD-10-CM | POA: Insufficient documentation

## 2014-04-02 DIAGNOSIS — H26492 Other secondary cataract, left eye: Secondary | ICD-10-CM | POA: Diagnosis not present

## 2014-04-02 DIAGNOSIS — H269 Unspecified cataract: Secondary | ICD-10-CM | POA: Insufficient documentation

## 2014-04-02 DIAGNOSIS — E079 Disorder of thyroid, unspecified: Secondary | ICD-10-CM | POA: Insufficient documentation

## 2014-04-02 DIAGNOSIS — Z961 Presence of intraocular lens: Secondary | ICD-10-CM | POA: Diagnosis not present

## 2014-04-02 DIAGNOSIS — Z9889 Other specified postprocedural states: Secondary | ICD-10-CM | POA: Insufficient documentation

## 2014-04-02 DIAGNOSIS — H5213 Myopia, bilateral: Secondary | ICD-10-CM | POA: Diagnosis not present

## 2014-04-02 DIAGNOSIS — H1045 Other chronic allergic conjunctivitis: Secondary | ICD-10-CM | POA: Diagnosis not present

## 2014-04-22 ENCOUNTER — Ambulatory Visit (INDEPENDENT_AMBULATORY_CARE_PROVIDER_SITE_OTHER): Payer: Medicare Other | Admitting: Family Medicine

## 2014-04-22 ENCOUNTER — Encounter: Payer: Self-pay | Admitting: Family Medicine

## 2014-04-22 VITALS — BP 115/58 | HR 86 | Wt 265.0 lb

## 2014-04-22 DIAGNOSIS — M545 Low back pain, unspecified: Secondary | ICD-10-CM

## 2014-04-22 DIAGNOSIS — F418 Other specified anxiety disorders: Secondary | ICD-10-CM | POA: Diagnosis not present

## 2014-04-22 DIAGNOSIS — F419 Anxiety disorder, unspecified: Secondary | ICD-10-CM

## 2014-04-22 DIAGNOSIS — F329 Major depressive disorder, single episode, unspecified: Secondary | ICD-10-CM

## 2014-04-22 DIAGNOSIS — E119 Type 2 diabetes mellitus without complications: Secondary | ICD-10-CM

## 2014-04-22 DIAGNOSIS — I872 Venous insufficiency (chronic) (peripheral): Secondary | ICD-10-CM

## 2014-04-22 DIAGNOSIS — H1013 Acute atopic conjunctivitis, bilateral: Secondary | ICD-10-CM | POA: Diagnosis not present

## 2014-04-22 DIAGNOSIS — F32A Depression, unspecified: Secondary | ICD-10-CM

## 2014-04-22 MED ORDER — PREDNISONE 20 MG PO TABS: ORAL_TABLET | ORAL | Status: AC

## 2014-04-22 MED ORDER — OLANZAPINE 5 MG PO TABS: 5.0000 mg | ORAL_TABLET | Freq: Every day | ORAL | Status: DC

## 2014-04-22 MED ORDER — ALPRAZOLAM 1 MG PO TABS
1.0000 mg | ORAL_TABLET | Freq: Two times a day (BID) | ORAL | Status: DC | PRN
Start: 2014-04-22 — End: 2014-06-29

## 2014-04-22 MED ORDER — OLOPATADINE HCL 0.1 % OP SOLN: 1.0000 [drp] | Freq: Two times a day (BID) | OPHTHALMIC | Status: DC

## 2014-04-22 MED ORDER — AMBULATORY NON FORMULARY MEDICATION: Status: DC

## 2014-04-22 MED ORDER — CITALOPRAM HYDROBROMIDE 40 MG PO TABS: 40.0000 mg | ORAL_TABLET | Freq: Every day | ORAL | Status: DC

## 2014-04-22 MED ORDER — TEMAZEPAM 15 MG PO CAPS: 15.0000 mg | ORAL_CAPSULE | Freq: Every evening | ORAL | Status: DC | PRN

## 2014-04-22 NOTE — Patient Instructions (Signed)
Return "Pain Diary" in two weeks.

## 2014-04-22 NOTE — Progress Notes (Signed)
CC: Sarah Wright is a 68 y.o. female is here for f/u leg edema and back pain   Subjective: HPI:  Low back pain that has been present for matter of years. When I saw her last we had an x-ray showing degenerative disc disease, spondylosis and spondylolisthesis. She localizes it in the midline and is nonradiating. It is worse with standing and walking. It is improved with sitting down. While standing she also expresses weakness in the legs that improves with sitting down. Pain is moderate to severe in severity and did not respond to Celebrex that I prescribed last. She denies any other motor or sensory disturbances in the lower extremities other than that described above and denies bowel or bladder incontinence.   Complains of continued reddening of both eyes right greater than left. Since I saw her last use Polytrim and did not have much of a response. She also saw an ophthalmologist who told her that it appeared she was having an allergic reaction with her eyes however did not provide her with any prescription. She reports occasional mucous discharge but mainly constant tearing. She denies any itching or pain. It is present all hours of the day. Nothing particularly makes it better or worse. She denies vision loss  Follow up chronic venous insufficiency: Continues to take [place] on a daily basis, 40 mg total. She notes diuresis but does not get much improvement with lower extremity swelling. The swelling seems to be worse at the end of the day better first thing in the morning. Since I saw her last its become much more painful described as tightness and red.  Since I saw her last I received records that in October she had an A1c drawn with the result of 6.8. Results were never communicated to her when she was back in [place]. This is the highest A1c that she believes she is ever had. She denies  polyphagia polydipsia but occasional polyuria that she attributes to Lasix use. Denies any poorly healing  wounds.  She's wanting to know if I can take over her anxiety and depression medication. She believes this is subjectively well controlled and denies any thoughts of wanting to harm herself or others   Review Of Systems Outlined In HPI  Past Medical History  Diagnosis Date  . Hyperlipidemia   . Thyroid disease     Past Surgical History  Procedure Laterality Date  . Thyroidectomy, partial      right side removed  . Abdominal hysterectomy    . Cervical fusion    . Carpal tunnel release      both wrists  . Gallbladder surgery    . Right foot surgery    . Cataract extraction, bilateral     Family History  Problem Relation Age of Onset  . Uterine cancer    . Pancreatic cancer    . Heart attack Father   . Depression Mother   . Diabetes Father   . Hypertension      parents    History   Social History  . Marital Status: Divorced    Spouse Name: N/A    Number of Children: N/A  . Years of Education: N/A   Occupational History  . Not on file.   Social History Main Topics  . Smoking status: Former Smoker    Quit date: 02/12/2014  . Smokeless tobacco: Not on file  . Alcohol Use: No  . Drug Use: No  . Sexual Activity: Not Currently   Other  Topics Concern  . Not on file   Social History Narrative     Objective: BP 115/58 mmHg  Pulse 86  Wt 265 lb (120.203 kg)  General: Alert and Oriented, No Acute Distress HEENT: Pupils equal, round, reactive to light. Right conjunctival erythema mild to moderate in severity, left conjunctival erythema mild in severity. Both presentation seem to be worse in the periphery. Anterior chamber open without debris bilaterally..  External ears unremarkable, canals clear with intact TMs with appropriate landmarks.  Middle ear appears open without effusion. Pink inferior turbinates.  Moist mucous membranes, pharynx without inflammation nor lesions.  Neck supple without palpable lymphadenopathy nor abnormal masses. Lungs: Clear comfortable  work of breathing Cardiac: Regular rate and rhythm.  Abdomen: Obese and soft Extremities: 1+ nonpitting edema in the ankles bilaterally with mild patchy erythema overlying these regions..  Strong peripheral pulses.  Mental Status: No depression, anxiety, nor agitation. Skin: Warm and dry.  Assessment & Plan: Sarah Wright was seen today for f/u leg edema and back pain.  Diagnoses and associated orders for this visit:  Midline low back pain without sciatica - predniSONE (DELTASONE) 20 MG tablet; Three tabs daily days 1-3, two tabs daily days 4-6, one tab daily days 7-9, half tab daily days 10-13.  Allergic conjunctivitis, bilateral - olopatadine (PATANOL) 0.1 % ophthalmic solution; Place 1 drop into both eyes 2 (two) times daily.  Type 2 diabetes mellitus without complication  Chronic venous insufficiency  Anxiety and depression  Other Orders - AMBULATORY NON FORMULARY MEDICATION; Thigh High Compression Stockings: Pressure 20-60mmHg.  Wear during all waking hours.  Please size to fit. Dx: Venous Insufficiency I87.2 - ALPRAZolam (XANAX) 1 MG tablet; Take 1 tablet (1 mg total) by mouth 2 (two) times daily as needed for anxiety. - citalopram (CELEXA) 40 MG tablet; Take 1 tablet (40 mg total) by mouth daily. - OLANZapine (ZYPREXA) 5 MG tablet; Take 1 tablet (5 mg total) by mouth at bedtime. - temazepam (RESTORIL) 15 MG capsule; Take 1 capsule (15 mg total) by mouth at bedtime as needed for sleep.    Midline low back pain: Differential is now including spinal stenosis and or spondylosis as the pain generator. Beginning to think that we should get an MRI, I would like to see how she responds to a prednisone taper before we commit to MRI. I've asked her to keep a diary of her pain while taking prednisone and the following days once she stops. Allergic conjunctivitis: Trial of Patanol, if ineffective will refer to another ophthalmologist for second opinion Technique 2 diabetes: Discussed the  diagnosis with patient and that since her A1c is below 7.0 she does not need to start on any medications but should focus on reducing carbohydrate intake Chronic venous insufficiency: Uncontrolled Another prescription was provided today for compression stockings to be worn during all waking hours Anxiety and depression: Controlled, refilled prescriptions above no changes to her current regimen that she's been taking for months in [place].  40 minutes spent face-to-face during visit today of which at least 50% was counseling or coordinating care regarding: 1. Midline low back pain without sciatica   2. Allergic conjunctivitis, bilateral   3. Type 2 diabetes mellitus without complication   4. Chronic venous insufficiency   5. Anxiety and depression      Return if symptoms worsen or fail to improve.

## 2014-04-30 DIAGNOSIS — B351 Tinea unguium: Secondary | ICD-10-CM | POA: Diagnosis not present

## 2014-04-30 DIAGNOSIS — M79675 Pain in left toe(s): Secondary | ICD-10-CM | POA: Diagnosis not present

## 2014-04-30 DIAGNOSIS — M79674 Pain in right toe(s): Secondary | ICD-10-CM | POA: Diagnosis not present

## 2014-05-05 ENCOUNTER — Encounter: Payer: Self-pay | Admitting: Family Medicine

## 2014-05-06 MED ORDER — ATORVASTATIN CALCIUM 20 MG PO TABS: 20.0000 mg | ORAL_TABLET | Freq: Every day | ORAL | Status: DC

## 2014-05-17 ENCOUNTER — Other Ambulatory Visit: Payer: Self-pay | Admitting: Family Medicine

## 2014-06-04 DIAGNOSIS — B351 Tinea unguium: Secondary | ICD-10-CM | POA: Diagnosis not present

## 2014-06-04 DIAGNOSIS — M79609 Pain in unspecified limb: Secondary | ICD-10-CM | POA: Diagnosis not present

## 2014-06-08 ENCOUNTER — Ambulatory Visit: Payer: Medicare Other | Admitting: Family Medicine

## 2014-06-15 ENCOUNTER — Ambulatory Visit (INDEPENDENT_AMBULATORY_CARE_PROVIDER_SITE_OTHER): Payer: Medicare Other | Admitting: Family Medicine

## 2014-06-15 ENCOUNTER — Encounter: Payer: Self-pay | Admitting: Family Medicine

## 2014-06-15 VITALS — BP 120/69 | HR 76 | Wt 255.0 lb

## 2014-06-15 DIAGNOSIS — H1089 Other conjunctivitis: Secondary | ICD-10-CM

## 2014-06-15 DIAGNOSIS — H109 Unspecified conjunctivitis: Secondary | ICD-10-CM

## 2014-06-15 DIAGNOSIS — M4726 Other spondylosis with radiculopathy, lumbar region: Secondary | ICD-10-CM | POA: Diagnosis not present

## 2014-06-15 DIAGNOSIS — M545 Low back pain, unspecified: Secondary | ICD-10-CM

## 2014-06-15 DIAGNOSIS — A499 Bacterial infection, unspecified: Secondary | ICD-10-CM

## 2014-06-15 MED ORDER — AMBULATORY NON FORMULARY MEDICATION: Status: DC

## 2014-06-15 MED ORDER — AZITHROMYCIN 250 MG PO TABS: ORAL_TABLET | ORAL | Status: AC

## 2014-06-15 MED ORDER — ERYTHROMYCIN 5 MG/GM OP OINT: TOPICAL_OINTMENT | OPHTHALMIC | Status: DC

## 2014-06-15 MED ORDER — TRAMADOL HCL 50 MG PO TABS: 50.0000 mg | ORAL_TABLET | Freq: Three times a day (TID) | ORAL | Status: DC | PRN

## 2014-06-15 NOTE — Progress Notes (Signed)
CC: Sarah Wright is a 69 y.o. female is here for Back Pain and right eye infection   Subjective: HPI:  Right eye irritation has been present for a little over 3 months now. Symptoms are present on a daily basis fluctuated between mild and moderate in severity. No benefit from Polytrim nor Patanol. Nothing seems to make the symptoms better or worse. It's had a green discharge let's present on a daily basis. She denies photophobia but describes a discomfort in the surface of the eye. Denies any new vision loss since symptoms above occurred. Currently symptoms are moderate in severity.Denies fevers, chills, nasal congestion.  Continued back pain that has been present for matter of years. She has had a x-ray showing degenerative disc disease, spondylosis and spondylolisthesis. She localizes it in the midline and is radiating into both legs now. It is worse with standing and walking. It is improved with sitting down. Weakness in both legs while standing and improves with sitting down. Pain is moderate to severe in severity and did not respond to Celebrex nor prednisone . She denies any other motor or sensory disturbances in the lower extremities other than that described above and denies bowel or bladder incontinence.   Review Of Systems Outlined In HPI  Past Medical History  Diagnosis Date  . Hyperlipidemia   . Thyroid disease     Past Surgical History  Procedure Laterality Date  . Thyroidectomy, partial      right side removed  . Abdominal hysterectomy    . Cervical fusion    . Carpal tunnel release      both wrists  . Gallbladder surgery    . Right foot surgery    . Cataract extraction, bilateral     Family History  Problem Relation Age of Onset  . Uterine cancer    . Pancreatic cancer    . Heart attack Father   . Depression Mother   . Diabetes Father   . Hypertension      parents    History   Social History  . Marital Status: Divorced    Spouse Name: N/A  . Number of  Children: N/A  . Years of Education: N/A   Occupational History  . Not on file.   Social History Main Topics  . Smoking status: Former Smoker    Quit date: 02/12/2014  . Smokeless tobacco: Not on file  . Alcohol Use: No  . Drug Use: No  . Sexual Activity: Not Currently   Other Topics Concern  . Not on file   Social History Narrative     Objective: BP 120/69 mmHg  Pulse 76  Wt 255 lb (115.667 kg)  General: Alert and Oriented, No Acute Distress HEENT: Pupils equal, round, reactive to light. Left Conjunctivae clear.  Right conjunctiva with mild peripheral erythema, moderate erythema at the medial cleavage of the eyelids with green discharge. External ears unremarkable, canals clear with intact TMs with appropriate landmarks.  Middle ear appears open without effusion. Pink inferior turbinates.  Moist mucous membranes, pharynx without inflammation nor lesions.  Neck supple without palpable lymphadenopathy nor abnormal masses. Lungs: Clear comfortable work of breathing Cardiac: Regular rate and rhythm.  Back: No midline spinous process tenderness to palpation in the lumbar region. Full range of motion and strength of both lower extremities Extremities: No peripheral edema.  Strong peripheral pulses.  Mental Status: No depression, anxiety, nor agitation. Skin: Warm and dry.  Assessment & Plan: Kieryn was seen today for back pain and  right eye infection.  Diagnoses and all orders for this visit:  Bacterial conjunctivitis Orders: -     erythromycin ophthalmic ointment; Apply 1cm ribbon in lower lid five times a day for ten days. -     azithromycin (ZITHROMAX) 250 MG tablet; Take two tabs at once on day 1, then one tab daily on days 2-5.  Osteoarthritis of spine with radiculopathy, lumbar region  Other orders -     AMBULATORY NON FORMULARY MEDICATION; Knee High Compression Stockings: Pressure 20-12mmHg.  Wear during all waking hours.  Please size to fit. Dx: Venous Insufficiency  I87.2 -     traMADol (ULTRAM) 50 MG tablet; Take 1 tablet (50 mg total) by mouth every 8 (eight) hours as needed.   Bacterial conjunctivitis: Start erythromycin ointment and Z-Pak. If no better after week will refer to ophthalmology Osteoarthritis of the lumbar region: Continued back pain, given lack of response to prednisone and Celebrex suspect there is more than just osteoarthritis is causing her pain. MRI has been ordered. I've prepared her that she will likely need to see Dr. Darene Lamer in our sports medicine clinic after getting this MRI to see if she would benefit from injections.  Tramadol for pain.   Return if symptoms worsen or fail to improve.

## 2014-06-22 ENCOUNTER — Ambulatory Visit (INDEPENDENT_AMBULATORY_CARE_PROVIDER_SITE_OTHER): Payer: Medicare Other

## 2014-06-22 DIAGNOSIS — M47896 Other spondylosis, lumbar region: Secondary | ICD-10-CM

## 2014-06-22 DIAGNOSIS — M5126 Other intervertebral disc displacement, lumbar region: Secondary | ICD-10-CM | POA: Diagnosis not present

## 2014-06-22 DIAGNOSIS — M5136 Other intervertebral disc degeneration, lumbar region: Secondary | ICD-10-CM | POA: Diagnosis not present

## 2014-06-22 DIAGNOSIS — M47816 Spondylosis without myelopathy or radiculopathy, lumbar region: Secondary | ICD-10-CM | POA: Diagnosis not present

## 2014-06-22 DIAGNOSIS — M545 Low back pain, unspecified: Secondary | ICD-10-CM

## 2014-06-22 DIAGNOSIS — M4726 Other spondylosis with radiculopathy, lumbar region: Secondary | ICD-10-CM

## 2014-06-24 DIAGNOSIS — H0262 Xanthelasma of right lower eyelid: Secondary | ICD-10-CM | POA: Diagnosis not present

## 2014-06-24 DIAGNOSIS — Z961 Presence of intraocular lens: Secondary | ICD-10-CM | POA: Diagnosis not present

## 2014-06-24 DIAGNOSIS — H0264 Xanthelasma of left upper eyelid: Secondary | ICD-10-CM | POA: Diagnosis not present

## 2014-06-24 DIAGNOSIS — H26492 Other secondary cataract, left eye: Secondary | ICD-10-CM | POA: Diagnosis not present

## 2014-06-24 DIAGNOSIS — H25811 Combined forms of age-related cataract, right eye: Secondary | ICD-10-CM | POA: Diagnosis not present

## 2014-06-24 DIAGNOSIS — H0265 Xanthelasma of left lower eyelid: Secondary | ICD-10-CM | POA: Diagnosis not present

## 2014-06-24 DIAGNOSIS — H3531 Nonexudative age-related macular degeneration: Secondary | ICD-10-CM | POA: Diagnosis not present

## 2014-06-24 DIAGNOSIS — H1013 Acute atopic conjunctivitis, bilateral: Secondary | ICD-10-CM | POA: Diagnosis not present

## 2014-06-24 DIAGNOSIS — H0261 Xanthelasma of right upper eyelid: Secondary | ICD-10-CM | POA: Diagnosis not present

## 2014-06-24 DIAGNOSIS — H527 Unspecified disorder of refraction: Secondary | ICD-10-CM | POA: Diagnosis not present

## 2014-06-27 ENCOUNTER — Other Ambulatory Visit: Payer: Self-pay | Admitting: Family Medicine

## 2014-06-29 ENCOUNTER — Encounter: Payer: Self-pay | Admitting: Sports Medicine

## 2014-06-29 ENCOUNTER — Ambulatory Visit (INDEPENDENT_AMBULATORY_CARE_PROVIDER_SITE_OTHER): Payer: Medicare Other | Admitting: Sports Medicine

## 2014-06-29 ENCOUNTER — Other Ambulatory Visit: Payer: Self-pay | Admitting: Family Medicine

## 2014-06-29 VITALS — BP 134/67 | HR 97 | Wt 268.0 lb

## 2014-06-29 DIAGNOSIS — M5136 Other intervertebral disc degeneration, lumbar region: Secondary | ICD-10-CM | POA: Diagnosis not present

## 2014-06-29 DIAGNOSIS — M51369 Other intervertebral disc degeneration, lumbar region without mention of lumbar back pain or lower extremity pain: Secondary | ICD-10-CM

## 2014-06-29 NOTE — Assessment & Plan Note (Addendum)
There is significant widespread lumbar spondylosis including degenerative disc disease and facet arthritis. She does have some left-sided S1 distribution radiculopathy, however her axial pain is significantly worse, and worse at the left sacroiliac joint. Sacroiliac joint injection today under ultrasound guidance for diagnostic and therapeutic purposes. If insufficient response we will proceed with a lumbar epidural.  Return in a month.  90% immediate pain relief with sacroiliac joint injection confirming this is the likely principal pain generator. Home rehabilitation exercises given.

## 2014-06-29 NOTE — Progress Notes (Signed)
   Subjective:    I'm seeing this patient as a consultation for:  Dr. Marcial Pacas  CC:  Back pain  HPI: Pt presents with complaint of back pain and h/o DDD diagnosed in 1970's. Recently her symptoms have worsened and today she rates her pain as 10/10. Her pain is worsened with activity and with forward flexion. She has pain radiating down her leg and numbness in the lateral aspect of her foot. She has tried physical therapy, Celebrex, and Tramadol which have failed to alleviate her pain. She has not had any steroid injections that she is aware of, but would consider them if warranted.  She denied saddle anesthesia, incontinence of urine or stool, LE weakness.  Past medical history, Surgical history, Family history not pertinant except as noted below, Social history, Allergies, and medications have been entered into the medical record, reviewed, and no changes needed.   Review of Systems: No headache, visual changes, nausea, vomiting, diarrhea, constipation, dizziness, abdominal pain, skin rash, fevers, chills, night sweats, weight loss, swollen lymph nodes, body aches, joint swelling, muscle aches, chest pain, shortness of breath, mood changes, visual or auditory hallucinations.   Objective:   General: Well Developed, well nourished, and in no acute distress.  Neuro/Psych: Alert and oriented x3, extra-ocular muscles intact, able to move all 4 extremities, sensation grossly intact. Skin: Warm and dry, no rashes noted.  Respiratory: Not using accessory muscles, speaking in full sentences, trachea midline.  Cardiovascular: Pulses palpable, no extremity edema. Abdomen: Does not appear distended. Back Exam:  Inspection: Unremarkable  SLR laying: Negative  XSLR laying: Negative  Palpable tenderness: Over SI joints bilaterally.  FABER: positive bilaterally. Sensory change: Decreased sensation to fine touch decreased over dorsal aspect of left foot. Reflexes: 2+ at both patellar tendons, 1+ at  achilles tendons, Babinski's downgoing.  Strength at foot  Plantar-flexion: 5/5 Dorsi-flexion: 5/5 Eversion: 5/5 Inversion: 5/5  Leg strength  Quad: 5/5 Hamstring: 5/5 Hip flexor: 5/5 Hip abductors: 5/5  Gait unremarkable.  Procedure: Real-time Ultrasound Guided Injection of left sacroiliac joint Device: GE Logiq E  Verbal informed consent obtained.  Time-out conducted.  Noted no overlying erythema, induration, or other signs of local infection.  Skin prepped in a sterile fashion.  Local anesthesia: Topical Ethyl chloride.  With sterile technique and under real time ultrasound guidance:  Using a spinal needle advanced over the sacrum premonitory taking care to avoid the S1 foramen, I entered the sacroiliac joint in 1 mL kenalog 40, 4 mL lidocaine injected easily. Completed without difficulty  Pain immediately resolved suggesting accurate placement of the medication.  Advised to call if fevers/chills, erythema, induration, drainage, or persistent bleeding.  Images permanently stored and available for review in the ultrasound unit.  Impression: Technically successful ultrasound guided injection.  Impression and Recommendations:   This case required medical decision making of moderate complexity.  # Low Back Pain - patient with known degenerative disk disease as demonstrated on MRI 06/22/14 - Patient's pain symptoms appear to localize to S1 nerve root with likely discogenic radiculopathy, however patient's most bothersome pain was reproduced at the left SI joint - Left sacroilliac joint injected with kenalog/lidocaine under ultrasound guidance today (See procedure note below) - Patient may continue Tramadol 50 mg Q8H prn pain - Will consider epidural injection if patient's symptoms fail to improve  Follow up in 4 weeks or sooner as needed

## 2014-06-29 NOTE — Telephone Encounter (Signed)
Andrea, Rx placed in in-box ready for pickup/faxing.  

## 2014-06-30 DIAGNOSIS — H25811 Combined forms of age-related cataract, right eye: Secondary | ICD-10-CM | POA: Diagnosis not present

## 2014-06-30 DIAGNOSIS — H527 Unspecified disorder of refraction: Secondary | ICD-10-CM | POA: Diagnosis not present

## 2014-06-30 DIAGNOSIS — H0262 Xanthelasma of right lower eyelid: Secondary | ICD-10-CM | POA: Diagnosis not present

## 2014-06-30 DIAGNOSIS — H26492 Other secondary cataract, left eye: Secondary | ICD-10-CM | POA: Diagnosis not present

## 2014-06-30 DIAGNOSIS — H0265 Xanthelasma of left lower eyelid: Secondary | ICD-10-CM | POA: Diagnosis not present

## 2014-06-30 DIAGNOSIS — H0261 Xanthelasma of right upper eyelid: Secondary | ICD-10-CM | POA: Diagnosis not present

## 2014-06-30 DIAGNOSIS — Z961 Presence of intraocular lens: Secondary | ICD-10-CM | POA: Diagnosis not present

## 2014-06-30 DIAGNOSIS — H0264 Xanthelasma of left upper eyelid: Secondary | ICD-10-CM | POA: Diagnosis not present

## 2014-06-30 DIAGNOSIS — H3531 Nonexudative age-related macular degeneration: Secondary | ICD-10-CM | POA: Diagnosis not present

## 2014-06-30 DIAGNOSIS — H1013 Acute atopic conjunctivitis, bilateral: Secondary | ICD-10-CM | POA: Diagnosis not present

## 2014-07-02 DIAGNOSIS — M79675 Pain in left toe(s): Secondary | ICD-10-CM | POA: Diagnosis not present

## 2014-07-02 DIAGNOSIS — M79674 Pain in right toe(s): Secondary | ICD-10-CM | POA: Diagnosis not present

## 2014-07-02 DIAGNOSIS — B351 Tinea unguium: Secondary | ICD-10-CM | POA: Diagnosis not present

## 2014-07-06 ENCOUNTER — Encounter: Payer: Self-pay | Admitting: Family Medicine

## 2014-07-07 ENCOUNTER — Telehealth: Payer: Self-pay | Admitting: *Deleted

## 2014-07-07 NOTE — Telephone Encounter (Signed)
She had such a fantastic initial response I simply think we need to give it some more time, the injection was no where near any of the nerves that control her walking. I am completely happy to see her again, as soon as possible if she continues to have symptoms, I'm sure we can work her in tomorrow.

## 2014-07-07 NOTE — Telephone Encounter (Signed)
Pt's daughter left vm stating that after you injected mom's SI joint, she's increasingly having trouble walking and leg pain.  Is this normal? Please advise.

## 2014-07-08 ENCOUNTER — Other Ambulatory Visit: Payer: Self-pay | Admitting: Family Medicine

## 2014-07-08 NOTE — Telephone Encounter (Signed)
Pt's daughter notified of recommendations.

## 2014-07-13 ENCOUNTER — Encounter: Payer: Self-pay | Admitting: Family Medicine

## 2014-07-13 ENCOUNTER — Ambulatory Visit (INDEPENDENT_AMBULATORY_CARE_PROVIDER_SITE_OTHER): Payer: Medicare Other | Admitting: Family Medicine

## 2014-07-13 VITALS — BP 124/67 | HR 78 | Wt 268.0 lb

## 2014-07-13 DIAGNOSIS — M5136 Other intervertebral disc degeneration, lumbar region: Secondary | ICD-10-CM | POA: Diagnosis not present

## 2014-07-13 DIAGNOSIS — Z9109 Other allergy status, other than to drugs and biological substances: Secondary | ICD-10-CM

## 2014-07-13 DIAGNOSIS — Z91048 Other nonmedicinal substance allergy status: Secondary | ICD-10-CM

## 2014-07-13 MED ORDER — TRAMADOL HCL 50 MG PO TABS: 50.0000 mg | ORAL_TABLET | Freq: Three times a day (TID) | ORAL | Status: DC | PRN

## 2014-07-13 MED ORDER — MONTELUKAST SODIUM 10 MG PO TABS: 10.0000 mg | ORAL_TABLET | Freq: Every day | ORAL | Status: DC

## 2014-07-13 NOTE — Progress Notes (Signed)
CC: Sarah Wright is a 69 y.o. female is here for ears itching   Subjective: HPI:  Complains of bilateral ear itching there was severe in severity over the weekend. It slowly improved on Sunday without any particular intervention. It is still present today but only to a mild degree. Originally started on Friday of last week. It has been accompanied by nasal congestion and sinus pressure in the cheeks. Post nasal drip has also been an issue for her. Interventions have included taking Zyrtec but there's been no benefit. She denies any fevers, chills, shortness of breath, wheezing, new cough. Denies any hearing loss or discharge from the ears  She is requesting refills on tramadol. She's been using this for her degenerative disc disease and posterior pelvic pain. Pain has returned in the SI joint region that receive injection at her last visit with our sports medicine doctor.   Review Of Systems Outlined In HPI  Past Medical History  Diagnosis Date  . Hyperlipidemia   . Thyroid disease     Past Surgical History  Procedure Laterality Date  . Thyroidectomy, partial      right side removed  . Abdominal hysterectomy    . Cervical fusion    . Carpal tunnel release      both wrists  . Gallbladder surgery    . Right foot surgery    . Cataract extraction, bilateral     Family History  Problem Relation Age of Onset  . Uterine cancer    . Pancreatic cancer    . Heart attack Father   . Depression Mother   . Diabetes Father   . Hypertension      parents    History   Social History  . Marital Status: Divorced    Spouse Name: N/A  . Number of Children: N/A  . Years of Education: N/A   Occupational History  . Not on file.   Social History Main Topics  . Smoking status: Former Smoker    Quit date: 02/12/2014  . Smokeless tobacco: Not on file  . Alcohol Use: No  . Drug Use: No  . Sexual Activity: Not Currently   Other Topics Concern  . Not on file   Social History Narrative      Objective: BP 124/67 mmHg  Pulse 78  Wt 268 lb (121.564 kg)  General: Alert and Oriented, No Acute Distress HEENT: Pupils equal, round, reactive to light. Conjunctivae clear.  External ears unremarkable, canals clear with intact TMs with appropriate landmarks.  Middle ear appears open without effusion. Pink inferior turbinates.  Moist mucous membranes, pharynx without inflammation nor lesions.  Neck supple without palpable lymphadenopathy nor abnormal masses. Lungs: Clear to auscultation bilaterally, no wheezing/ronchi/rales.  Comfortable work of breathing. Good air movement. Extremities: No peripheral edema.  Strong peripheral pulses.  Mental Status: No depression, anxiety, nor agitation. Skin: Warm and dry.  Assessment & Plan: Sarah Wright was seen today for ears itching.  Diagnoses and all orders for this visit:  Environmental allergies Orders: -     montelukast (SINGULAIR) 10 MG tablet; Take 1 tablet (10 mg total) by mouth at bedtime.  DDD (degenerative disc disease), lumbar  Other orders -     traMADol (ULTRAM) 50 MG tablet; Take 1-2 tablets (50-100 mg total) by mouth every 8 (eight) hours as needed.   Suspect environmental allergens, pollen, is causing her ear complaints nasal complaints and upper respiratory complaints. Start Singulair but continue on Zyrtec. If ears continue to itch had asked  her to call me and I'll send in medicated eardrops. Refills provided of tramadol, encouraged her to move up her visit with Dr. Darene Lamer to a closer date.  Return if symptoms worsen or fail to improve.

## 2014-07-21 ENCOUNTER — Encounter: Payer: Self-pay | Admitting: Sports Medicine

## 2014-07-21 ENCOUNTER — Ambulatory Visit (INDEPENDENT_AMBULATORY_CARE_PROVIDER_SITE_OTHER): Payer: Medicare Other | Admitting: Sports Medicine

## 2014-07-21 VITALS — BP 132/73 | HR 95 | Ht 66.0 in | Wt 267.0 lb

## 2014-07-21 DIAGNOSIS — M47896 Other spondylosis, lumbar region: Secondary | ICD-10-CM

## 2014-07-21 DIAGNOSIS — M170 Bilateral primary osteoarthritis of knee: Secondary | ICD-10-CM | POA: Insufficient documentation

## 2014-07-21 MED ORDER — DIAZEPAM 5 MG PO TABS: ORAL_TABLET | ORAL | Status: DC

## 2014-07-21 MED ORDER — HYDROCODONE-ACETAMINOPHEN 5-325 MG PO TABS: 1.0000 | ORAL_TABLET | Freq: Three times a day (TID) | ORAL | Status: DC | PRN

## 2014-07-21 NOTE — Progress Notes (Signed)
  Subjective:    CC: follow-up  HPI: This is a pleasant 69 year old female, she has multilevel lumbar spondylosis, we did a left-sided sacroiliac joint injection at the last visit, she had only one day of relief but unfortunately complete recurrence of her pain. She tells me the pain is severe, persistent, localized bilaterally, it is worse with standing and better with sitting, flexion, and driving. Denies any lower extremity radicular pain.  Bilateral knee pain: Moderate, persistent, with swelling and without mechanical symptoms, there is gelling, pain is predominantly at the joint lines. No mechanical symptoms, no trauma.  Past medical history, Surgical history, Family history not pertinant except as noted below, Social history, Allergies, and medications have been entered into the medical record, reviewed, and no changes needed.   Review of Systems: No fevers, chills, night sweats, weight loss, chest pain, or shortness of breath.   Objective:    General: Well Developed, well nourished, and in no acute distress.  Neuro: Alert and oriented x3, extra-ocular muscles intact, sensation grossly intact.  HEENT: Normocephalic, atraumatic, pupils equal round reactive to light, neck supple, no masses, no lymphadenopathy, thyroid nonpalpable.  Skin: Warm and dry, no rashes. Cardiac: Regular rate and rhythm, no murmurs rubs or gallops, no lower extremity edema.  Respiratory: Clear to auscultation bilaterally. Not using accessory muscles, speaking in full sentences. Bilateral Knee: Visible and palpable effusion with a fluid wave and tenderness at the medial joint line. ROM normal in flexion and extension and lower leg rotation. Ligaments with solid consistent endpoints including ACL, PCL, LCL, MCL. Negative Mcmurray's and provocative meniscal tests. Non painful patellar compression. Patellar and quadriceps tendons unremarkable. Hamstring and quadriceps strength is normal.  Procedure: Real-time  Ultrasound Guided aspiration/Injection of left knee Device: GE Logiq E  Verbal informed consent obtained.  Time-out conducted.  Noted no overlying erythema, induration, or other signs of local infection.  Skin prepped in a sterile fashion.  Local anesthesia: Topical Ethyl chloride.  With sterile technique and under real time ultrasound guidance:  10 mL of straw-colored fluid aspirated, syringe switched and 1 mL kenalog 40, 4 mL lidocaine injected easily. Completed without difficulty  Pain immediately resolved suggesting accurate placement of the medication.  Advised to call if fevers/chills, erythema, induration, drainage, or persistent bleeding.  Images permanently stored and available for review in the ultrasound unit.  Impression: Technically successful ultrasound guided injection.  Procedure: Real-time Ultrasound Guided Injection of right knee Device: GE Logiq E  Verbal informed consent obtained.  Time-out conducted.  Noted no overlying erythema, induration, or other signs of local infection.  Skin prepped in a sterile fashion.  Local anesthesia: Topical Ethyl chloride.  With sterile technique and under real time ultrasound guidance:  1 mL kenalog 40, 4 mL lidocaine injected easily into the suprapatellar recess. Completed without difficulty  Pain immediately resolved suggesting accurate placement of the medication.  Advised to call if fevers/chills, erythema, induration, drainage, or persistent bleeding.  Images permanently stored and available for review in the ultrasound unit.  Impression: Technically successful ultrasound guided injection.  Impression and Recommendations:    I spent 40 minutes with this patient, greater than 50% was face-to-face time counseling regarding the above multiple diagnoses

## 2014-07-21 NOTE — Assessment & Plan Note (Signed)
Aspiration and injection as above.  return 3 weeks.

## 2014-07-21 NOTE — Assessment & Plan Note (Addendum)
Pain is predominantly facetogenic.  she did not respond to a sacroiliac joint injection, with the exception of for that day.  she does have multilevel facet arthritis so we will proceed with a bilateral L3-L4, L4-L5, and L5-S1 facet injections, with close attention paid to concordant pain.  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  the L4-L5 facets look the worst so I do expect the most concordant pain from this level, I also think that this level will be the symptomatic level.  Return to see me 3 weeks after facet injections.  she does desire some Valium for preprocedural anxiolysis

## 2014-07-23 ENCOUNTER — Other Ambulatory Visit: Payer: Self-pay | Admitting: Sports Medicine

## 2014-07-23 ENCOUNTER — Ambulatory Visit
Admission: RE | Admit: 2014-07-23 | Discharge: 2014-07-23 | Disposition: A | Discharge status: Home / Self Care | Payer: Medicare Other | Source: Ambulatory Visit | Attending: Sports Medicine | Admitting: Sports Medicine

## 2014-07-23 DIAGNOSIS — M47816 Spondylosis without myelopathy or radiculopathy, lumbar region: Secondary | ICD-10-CM | POA: Diagnosis not present

## 2014-07-23 DIAGNOSIS — M545 Low back pain: Secondary | ICD-10-CM | POA: Diagnosis not present

## 2014-07-23 DIAGNOSIS — M47896 Other spondylosis, lumbar region: Secondary | ICD-10-CM

## 2014-07-23 MED ORDER — IOHEXOL 180 MG/ML  SOLN
2.0000 mL | Freq: Once | INTRAMUSCULAR | Status: AC | PRN
  Administered 2014-07-23: 2 mL via INTRA_ARTICULAR

## 2014-07-23 MED ORDER — METHYLPREDNISOLONE ACETATE 40 MG/ML INJ SUSP (RADIOLOG
120.0000 mg | Freq: Once | INTRAMUSCULAR | Status: AC
  Administered 2014-07-23: 120 mg via INTRA_ARTICULAR

## 2014-07-23 NOTE — Discharge Instructions (Signed)

## 2014-07-27 ENCOUNTER — Ambulatory Visit: Payer: Medicare Other | Admitting: Sports Medicine

## 2014-07-29 DIAGNOSIS — H527 Unspecified disorder of refraction: Secondary | ICD-10-CM | POA: Diagnosis not present

## 2014-07-29 DIAGNOSIS — H0262 Xanthelasma of right lower eyelid: Secondary | ICD-10-CM | POA: Diagnosis not present

## 2014-07-29 DIAGNOSIS — H26492 Other secondary cataract, left eye: Secondary | ICD-10-CM | POA: Diagnosis not present

## 2014-07-29 DIAGNOSIS — Z961 Presence of intraocular lens: Secondary | ICD-10-CM | POA: Diagnosis not present

## 2014-07-29 DIAGNOSIS — H1013 Acute atopic conjunctivitis, bilateral: Secondary | ICD-10-CM | POA: Diagnosis not present

## 2014-07-29 DIAGNOSIS — H25811 Combined forms of age-related cataract, right eye: Secondary | ICD-10-CM | POA: Diagnosis not present

## 2014-07-29 DIAGNOSIS — H0264 Xanthelasma of left upper eyelid: Secondary | ICD-10-CM | POA: Diagnosis not present

## 2014-07-29 DIAGNOSIS — H0261 Xanthelasma of right upper eyelid: Secondary | ICD-10-CM | POA: Diagnosis not present

## 2014-07-29 DIAGNOSIS — H3531 Nonexudative age-related macular degeneration: Secondary | ICD-10-CM | POA: Diagnosis not present

## 2014-07-29 DIAGNOSIS — H0265 Xanthelasma of left lower eyelid: Secondary | ICD-10-CM | POA: Diagnosis not present

## 2014-08-03 DIAGNOSIS — L603 Nail dystrophy: Secondary | ICD-10-CM | POA: Diagnosis not present

## 2014-08-03 DIAGNOSIS — M79674 Pain in right toe(s): Secondary | ICD-10-CM | POA: Diagnosis not present

## 2014-08-03 DIAGNOSIS — I739 Peripheral vascular disease, unspecified: Secondary | ICD-10-CM | POA: Diagnosis not present

## 2014-08-03 DIAGNOSIS — B351 Tinea unguium: Secondary | ICD-10-CM | POA: Diagnosis not present

## 2014-08-03 DIAGNOSIS — M79675 Pain in left toe(s): Secondary | ICD-10-CM | POA: Diagnosis not present

## 2014-08-06 DIAGNOSIS — H0261 Xanthelasma of right upper eyelid: Secondary | ICD-10-CM | POA: Diagnosis not present

## 2014-08-06 DIAGNOSIS — J449 Chronic obstructive pulmonary disease, unspecified: Secondary | ICD-10-CM | POA: Diagnosis not present

## 2014-08-06 DIAGNOSIS — H3531 Nonexudative age-related macular degeneration: Secondary | ICD-10-CM | POA: Diagnosis not present

## 2014-08-06 DIAGNOSIS — Z961 Presence of intraocular lens: Secondary | ICD-10-CM | POA: Diagnosis not present

## 2014-08-06 DIAGNOSIS — K219 Gastro-esophageal reflux disease without esophagitis: Secondary | ICD-10-CM | POA: Diagnosis not present

## 2014-08-06 DIAGNOSIS — E785 Hyperlipidemia, unspecified: Secondary | ICD-10-CM | POA: Diagnosis not present

## 2014-08-06 DIAGNOSIS — F329 Major depressive disorder, single episode, unspecified: Secondary | ICD-10-CM | POA: Diagnosis not present

## 2014-08-06 DIAGNOSIS — F419 Anxiety disorder, unspecified: Secondary | ICD-10-CM | POA: Diagnosis not present

## 2014-08-06 DIAGNOSIS — M1990 Unspecified osteoarthritis, unspecified site: Secondary | ICD-10-CM | POA: Diagnosis not present

## 2014-08-06 DIAGNOSIS — G4730 Sleep apnea, unspecified: Secondary | ICD-10-CM | POA: Diagnosis not present

## 2014-08-06 DIAGNOSIS — Z9049 Acquired absence of other specified parts of digestive tract: Secondary | ICD-10-CM | POA: Diagnosis not present

## 2014-08-06 DIAGNOSIS — Z9842 Cataract extraction status, left eye: Secondary | ICD-10-CM | POA: Diagnosis not present

## 2014-08-06 DIAGNOSIS — Z87891 Personal history of nicotine dependence: Secondary | ICD-10-CM | POA: Diagnosis not present

## 2014-08-06 DIAGNOSIS — Z7982 Long term (current) use of aspirin: Secondary | ICD-10-CM | POA: Diagnosis not present

## 2014-08-06 DIAGNOSIS — H1013 Acute atopic conjunctivitis, bilateral: Secondary | ICD-10-CM | POA: Diagnosis not present

## 2014-08-06 DIAGNOSIS — H25811 Combined forms of age-related cataract, right eye: Secondary | ICD-10-CM | POA: Diagnosis not present

## 2014-08-06 DIAGNOSIS — Z882 Allergy status to sulfonamides status: Secondary | ICD-10-CM | POA: Diagnosis not present

## 2014-08-06 DIAGNOSIS — Z79899 Other long term (current) drug therapy: Secondary | ICD-10-CM | POA: Diagnosis not present

## 2014-08-06 DIAGNOSIS — I10 Essential (primary) hypertension: Secondary | ICD-10-CM | POA: Diagnosis not present

## 2014-08-07 ENCOUNTER — Other Ambulatory Visit: Payer: Self-pay | Admitting: Family Medicine

## 2014-08-21 ENCOUNTER — Encounter: Payer: Self-pay | Admitting: Sports Medicine

## 2014-08-21 ENCOUNTER — Ambulatory Visit (INDEPENDENT_AMBULATORY_CARE_PROVIDER_SITE_OTHER): Payer: Medicare Other | Admitting: Sports Medicine

## 2014-08-21 VITALS — BP 130/71 | HR 82 | Ht 66.0 in | Wt 269.0 lb

## 2014-08-21 DIAGNOSIS — M170 Bilateral primary osteoarthritis of knee: Secondary | ICD-10-CM | POA: Diagnosis not present

## 2014-08-21 DIAGNOSIS — M47896 Other spondylosis, lumbar region: Secondary | ICD-10-CM | POA: Diagnosis not present

## 2014-08-21 MED ORDER — HYDROCODONE-ACETAMINOPHEN 5-325 MG PO TABS: 1.0000 | ORAL_TABLET | Freq: Three times a day (TID) | ORAL | Status: DC | PRN

## 2014-08-21 NOTE — Progress Notes (Signed)
  Subjective:    CC: Follow-up  HPI: Osteoarthritis of both knees: Doing well, had a good the temporary response after bilateral knee steroid injection, pain is mild, persistent, localized to both joint lines.  Back pain: Suspected facet arthritis is the main pain generator, she recently had a bilateral L3-L4, and L4-L5 facet injections that provided 100% temporary pain relief for about a week. She is amenable to proceeding to the next step.  Past medical history, Surgical history, Family history not pertinant except as noted below, Social history, Allergies, and medications have been entered into the medical record, reviewed, and no changes needed.   Review of Systems: No fevers, chills, night sweats, weight loss, chest pain, or shortness of breath.   Objective:    General: Well Developed, well nourished, and in no acute distress.  Neuro: Alert and oriented x3, extra-ocular muscles intact, sensation grossly intact.  HEENT: Normocephalic, atraumatic, pupils equal round reactive to light, neck supple, no masses, no lymphadenopathy, thyroid nonpalpable.  Skin: Warm and dry, no rashes. Cardiac: Regular rate and rhythm, no murmurs rubs or gallops, no lower extremity edema.  Respiratory: Clear to auscultation bilaterally. Not using accessory muscles, speaking in full sentences.  Procedure: Real-time Ultrasound Guided Injection of left knee Device: GE Logiq E  Verbal informed consent obtained.  Time-out conducted.  Noted no overlying erythema, induration, or other signs of local infection.  Skin prepped in a sterile fashion.  Local anesthesia: Topical Ethyl chloride.  With sterile technique and under real time ultrasound guidance:   30 mg/2 mL of OrthoVisc (sodium hyaluronate) in a prefilled syringe was injected easily into the knee through a 22-gauge needle. Completed without difficulty  Pain immediately resolved suggesting accurate placement of the medication.  Advised to call if  fevers/chills, erythema, induration, drainage, or persistent bleeding.  Images permanently stored and available for review in the ultrasound unit.  Impression: Technically successful ultrasound guided injection.  Procedure: Real-time Ultrasound Guided Injection of right knee Device: GE Logiq E  Verbal informed consent obtained.  Time-out conducted.  Noted no overlying erythema, induration, or other signs of local infection.  Skin prepped in a sterile fashion.  Local anesthesia: Topical Ethyl chloride.  With sterile technique and under real time ultrasound guidance:   30 mg/2 mL of OrthoVisc (sodium hyaluronate) in a prefilled syringe was injected easily into the knee through a 22-gauge needle. Completed without difficulty  Pain immediately resolved suggesting accurate placement of the medication.  Advised to call if fevers/chills, erythema, induration, drainage, or persistent bleeding.  Images permanently stored and available for review in the ultrasound unit.  Impression: Technically successful ultrasound guided injection.  Impression and Recommendations:

## 2014-08-21 NOTE — Assessment & Plan Note (Signed)
Moderate response to bilateral steroid injection. Started Orthovisc injections today. Return in one week for Orthovisc injection #2 of 4 into both knees.

## 2014-08-21 NOTE — Assessment & Plan Note (Signed)
Excellent but temporary response to L3-L5 bilateral facet injections. I'm going to set her up for a facet radio frequency ablation, L3-L5 bilateral. Next line return to see me after medial branch blocks and RFA to evaluate response.

## 2014-08-25 ENCOUNTER — Other Ambulatory Visit: Payer: Self-pay | Admitting: Sports Medicine

## 2014-08-25 DIAGNOSIS — M47896 Other spondylosis, lumbar region: Secondary | ICD-10-CM

## 2014-08-26 ENCOUNTER — Ambulatory Visit (INDEPENDENT_AMBULATORY_CARE_PROVIDER_SITE_OTHER): Payer: Medicare Other | Admitting: Sports Medicine

## 2014-08-26 ENCOUNTER — Encounter: Payer: Self-pay | Admitting: Sports Medicine

## 2014-08-26 DIAGNOSIS — M170 Bilateral primary osteoarthritis of knee: Secondary | ICD-10-CM | POA: Diagnosis not present

## 2014-08-26 MED ORDER — AMBULATORY NON FORMULARY MEDICATION: Status: DC

## 2014-08-26 NOTE — Assessment & Plan Note (Signed)
Orthovisc injection #2 into both knees. Return in one week for injection #3.

## 2014-08-26 NOTE — Progress Notes (Signed)

## 2014-09-02 ENCOUNTER — Ambulatory Visit
Admission: RE | Admit: 2014-09-02 | Discharge: 2014-09-02 | Disposition: A | Discharge status: Home / Self Care | Payer: Medicare Other | Source: Ambulatory Visit | Attending: Sports Medicine | Admitting: Sports Medicine

## 2014-09-02 ENCOUNTER — Ambulatory Visit (INDEPENDENT_AMBULATORY_CARE_PROVIDER_SITE_OTHER): Payer: Medicare Other | Admitting: Sports Medicine

## 2014-09-02 ENCOUNTER — Encounter: Payer: Self-pay | Admitting: Sports Medicine

## 2014-09-02 ENCOUNTER — Other Ambulatory Visit: Payer: Self-pay | Admitting: Sports Medicine

## 2014-09-02 VITALS — BP 123/71 | HR 84 | Wt 274.0 lb

## 2014-09-02 DIAGNOSIS — M47896 Other spondylosis, lumbar region: Secondary | ICD-10-CM

## 2014-09-02 DIAGNOSIS — M170 Bilateral primary osteoarthritis of knee: Secondary | ICD-10-CM | POA: Diagnosis not present

## 2014-09-02 DIAGNOSIS — M545 Low back pain: Secondary | ICD-10-CM | POA: Diagnosis not present

## 2014-09-02 DIAGNOSIS — M5406 Panniculitis affecting regions of neck and back, lumbar region: Secondary | ICD-10-CM | POA: Diagnosis not present

## 2014-09-02 MED ORDER — DIAZEPAM 5 MG PO TABS: ORAL_TABLET | ORAL | Status: DC

## 2014-09-02 NOTE — Progress Notes (Signed)

## 2014-09-02 NOTE — Assessment & Plan Note (Signed)
OrthoVisc injection #3 into both knees. Return in 1 week for number 4.

## 2014-09-02 NOTE — Discharge Instructions (Signed)

## 2014-09-07 ENCOUNTER — Other Ambulatory Visit: Payer: Self-pay | Admitting: Family Medicine

## 2014-09-08 LAB — HM DIABETES EYE EXAM

## 2014-09-08 NOTE — Telephone Encounter (Signed)
Andrea, Rx placed in in-box ready for pickup/faxing.  

## 2014-09-09 ENCOUNTER — Ambulatory Visit (INDEPENDENT_AMBULATORY_CARE_PROVIDER_SITE_OTHER): Payer: Medicare Other | Admitting: Sports Medicine

## 2014-09-09 VITALS — BP 124/68 | HR 85 | Wt 274.0 lb

## 2014-09-09 DIAGNOSIS — M47816 Spondylosis without myelopathy or radiculopathy, lumbar region: Secondary | ICD-10-CM

## 2014-09-09 DIAGNOSIS — M170 Bilateral primary osteoarthritis of knee: Secondary | ICD-10-CM

## 2014-09-09 NOTE — Progress Notes (Signed)

## 2014-09-09 NOTE — Assessment & Plan Note (Signed)
Excellent response to L3 L5 bilateral facet injections, with a recent excellent response to medial branch blocks with 100% temporary resolution of pain making her a good candidate for RFA, she has been scheduled.

## 2014-09-09 NOTE — Assessment & Plan Note (Signed)
Orthovisc 4 of 4 into both knees, doing extremely well.  Return in one month.

## 2014-09-17 ENCOUNTER — Ambulatory Visit
Admission: RE | Admit: 2014-09-17 | Discharge: 2014-09-17 | Disposition: A | Discharge status: Home / Self Care | Payer: Medicare Other | Source: Ambulatory Visit | Attending: Sports Medicine | Admitting: Sports Medicine

## 2014-09-17 ENCOUNTER — Other Ambulatory Visit: Payer: Self-pay | Admitting: Sports Medicine

## 2014-09-17 VITALS — BP 116/63 | HR 76 | Temp 97.7°F | Resp 12

## 2014-09-17 DIAGNOSIS — M47896 Other spondylosis, lumbar region: Secondary | ICD-10-CM

## 2014-09-17 DIAGNOSIS — M5406 Panniculitis affecting regions of neck and back, lumbar region: Secondary | ICD-10-CM | POA: Diagnosis not present

## 2014-09-17 DIAGNOSIS — M5136 Other intervertebral disc degeneration, lumbar region: Secondary | ICD-10-CM

## 2014-09-17 DIAGNOSIS — M545 Low back pain: Secondary | ICD-10-CM | POA: Diagnosis not present

## 2014-09-17 DIAGNOSIS — M51369 Other intervertebral disc degeneration, lumbar region without mention of lumbar back pain or lower extremity pain: Secondary | ICD-10-CM

## 2014-09-17 DIAGNOSIS — M47816 Spondylosis without myelopathy or radiculopathy, lumbar region: Secondary | ICD-10-CM

## 2014-09-17 MED ORDER — FENTANYL CITRATE (PF) 100 MCG/2ML IJ SOLN
25.0000 ug | INTRAMUSCULAR | Status: DC | PRN
  Administered 2014-09-17: 25 ug via INTRAVENOUS

## 2014-09-17 MED ORDER — SODIUM CHLORIDE 0.9 % IV SOLN
Freq: Once | INTRAVENOUS | Status: AC
  Administered 2014-09-17: 10:00:00 via INTRAVENOUS

## 2014-09-17 MED ORDER — MIDAZOLAM HCL 2 MG/2ML IJ SOLN
1.0000 mg | INTRAMUSCULAR | Status: DC | PRN
  Administered 2014-09-17: 0.5 mg via INTRAVENOUS

## 2014-09-17 MED ORDER — KETOROLAC TROMETHAMINE 30 MG/ML IJ SOLN
30.0000 mg | Freq: Once | INTRAMUSCULAR | Status: AC
  Administered 2014-09-17: 30 mg via INTRAVENOUS

## 2014-09-17 MED ORDER — METHYLPREDNISOLONE ACETATE 40 MG/ML INJ SUSP (RADIOLOG
120.0000 mg | Freq: Once | INTRAMUSCULAR | Status: AC
  Administered 2014-09-17: 120 mg via INTRA_ARTICULAR

## 2014-09-17 NOTE — Progress Notes (Signed)
Sedation time 54 minutes for RFA.  jkl

## 2014-09-17 NOTE — Discharge Instructions (Signed)
Radio Frequency Ablation Post Procedure Discharge Instructions ° °1. May resume a regular diet and any medications that you routinely take (including pain medications). °2. No driving day of procedure. °3. Upon discharge go home and rest for at least 4 hours.  May use an ice pack as needed to injection sites on back. °4. Remove bandades later, today. ° ° ° °Please contact our office at [phone] for the following symptoms: ° °· Fever greater than 100 degrees °· Increased swelling, pain, or redness at injection site. ° ° °Thank you for visiting [facility]. °

## 2014-09-17 NOTE — Progress Notes (Signed)
O2 added at 1L/min via [place] for O2 sats 86% on room air with HOB elevated 30 degrees after RFA.  Patient resting quietly; denies pain.  Easily aroused.  jkl

## 2014-09-23 ENCOUNTER — Encounter: Payer: Self-pay | Admitting: Family Medicine

## 2014-09-23 ENCOUNTER — Ambulatory Visit (INDEPENDENT_AMBULATORY_CARE_PROVIDER_SITE_OTHER): Payer: Medicare Other | Admitting: Family Medicine

## 2014-09-23 VITALS — BP 122/73 | HR 82 | Ht 66.0 in | Wt 271.0 lb

## 2014-09-23 DIAGNOSIS — Z111 Encounter for screening for respiratory tuberculosis: Secondary | ICD-10-CM

## 2014-09-23 DIAGNOSIS — Z9889 Other specified postprocedural states: Secondary | ICD-10-CM

## 2014-09-23 DIAGNOSIS — Z1231 Encounter for screening mammogram for malignant neoplasm of breast: Secondary | ICD-10-CM | POA: Diagnosis not present

## 2014-09-23 DIAGNOSIS — Z0000 Encounter for general adult medical examination without abnormal findings: Secondary | ICD-10-CM

## 2014-09-23 DIAGNOSIS — H9193 Unspecified hearing loss, bilateral: Secondary | ICD-10-CM

## 2014-09-23 LAB — CBC
HCT: 40.8 % (ref 36.0–46.0)
Hemoglobin: 13.2 g/dL (ref 12.0–15.0)
MCH: 26.7 pg (ref 26.0–34.0)
MCHC: 32.4 g/dL (ref 30.0–36.0)
MCV: 82.6 fL (ref 78.0–100.0)
MPV: 10.2 fL (ref 8.6–12.4)
PLATELETS: 220 10*3/uL (ref 150–400)
RBC: 4.94 MIL/uL (ref 3.87–5.11)
RDW: 15.5 % (ref 11.5–15.5)
WBC: 10.8 10*3/uL — ABNORMAL HIGH (ref 4.0–10.5)

## 2014-09-23 LAB — HEPATITIS B SURFACE ANTIGEN: Hepatitis B Surface Ag: NEGATIVE

## 2014-09-23 NOTE — Progress Notes (Signed)
Subjective:    Sarah Wright is a 69 y.o. female who presents for Medicare Annual/Subsequent preventive examination.  Preventive Screening-Counseling & Management  Tobacco History  Smoking status  . Former Smoker  . Quit date: 02/12/2014  Smokeless tobacco  . Not on file   Colonoscopy:Normal 2014 Papsmear:No indication Mammogram: Ordering today  DEXA: overdue, ordering today  Influenza Vaccine: UTD Pneumovax: Needs final P-23, left before admin Td/Tdap: Needs Tdap, left before admin Zoster: UTD    Problems Prior to Visit 1. Chronic joint pain   Current Problems (verified) Patient Active Problem List   Diagnosis Date Noted  . Primary osteoarthritis of both knees 07/21/2014  . H/O colonoscopy 04/02/2014  . Thyroid disease 04/02/2014  . Cataract 04/02/2014  . Type 2 diabetes mellitus 04/02/2014  . Chronic venous insufficiency 03/27/2014  . Lumbar spondylosis 03/17/2014  . Aortic atherosclerosis 03/17/2014  . Anxiety and depression 03/16/2014  . Hyperlipidemia 03/16/2014  . COLD (chronic obstructive lung disease) 03/16/2014  . Insomnia 03/16/2014  . Esophageal reflux 03/16/2014  . Bilateral hand pain 03/16/2014  . DDD (degenerative disc disease), lumbar 03/16/2014    Medications Prior to Visit Current Outpatient Prescriptions on File Prior to Visit  Medication Sig Dispense Refill  . ALPRAZolam (XANAX) 1 MG tablet TAKE ONE TABLET BY MOUTH TWICE A DAY AS NEEDED FOR ANXIETY 60 tablet 0  . AMBULATORY NON FORMULARY MEDICATION Knee-high, medium compression, graduated compression stockings. Apply to lower extremities. [email or web address], Zippered Compression Stockings, medium circ, long length 1 each 0  . aspirin 81 MG tablet Take 81 mg by mouth daily.    [name] atorvastatin (LIPITOR) 20 MG tablet Take 1 tablet (20 mg total) by mouth daily. 90 tablet 3  . budesonide-formoterol (SYMBICORT) 160-4.5 MCG/ACT inhaler Inhale 2 puffs into the lungs 2 (two) times daily.    [name]  buPROPion (WELLBUTRIN SR) 150 MG 12 hr tablet Take 150 mg by mouth 2 (two) times daily.    . citalopram (CELEXA) 40 MG tablet Take 1 tablet (40 mg total) by mouth daily. 90 tablet 1  . furosemide (LASIX) 20 MG tablet TAKE ONE TABLET BY MOUTH DAILY AS NEEDED FOR ANKLE SWELLING 30 tablet 2  . HYDROcodone-acetaminophen (NORCO/VICODIN) 5-325 MG per tablet Take 1 tablet by mouth every 8 (eight) hours as needed for moderate pain. 90 tablet 0  . montelukast (SINGULAIR) 10 MG tablet Take 1 tablet (10 mg total) by mouth at bedtime. 30 tablet 3  . OLANZapine (ZYPREXA) 5 MG tablet Take 1 tablet (5 mg total) by mouth at bedtime. 90 tablet 1  . temazepam (RESTORIL) 15 MG capsule TAKE ONE CAPSULE BY MOUTH NIGHTLY AT BEDTIME AS NEEDED FOR SLEEP  30 capsule 3   No current facility-administered medications on file prior to visit.    Current Medications (verified) Current Outpatient Prescriptions  Medication Sig Dispense Refill  . ALPRAZolam (XANAX) 1 MG tablet TAKE ONE TABLET BY MOUTH TWICE A DAY AS NEEDED FOR ANXIETY 60 tablet 0  . AMBULATORY NON FORMULARY MEDICATION Knee-high, medium compression, graduated compression stockings. Apply to lower extremities. [email or web address], Zippered Compression Stockings, medium circ, long length 1 each 0  . aspirin 81 MG tablet Take 81 mg by mouth daily.    [name] atorvastatin (LIPITOR) 20 MG tablet Take 1 tablet (20 mg total) by mouth daily. 90 tablet 3  . budesonide-formoterol (SYMBICORT) 160-4.5 MCG/ACT inhaler Inhale 2 puffs into the lungs 2 (two) times daily.    [name] buPROPion (WELLBUTRIN SR) 150 MG 12 hr tablet Take 150 mg  by mouth 2 (two) times daily.    . citalopram (CELEXA) 40 MG tablet Take 1 tablet (40 mg total) by mouth daily. 90 tablet 1  . furosemide (LASIX) 20 MG tablet TAKE ONE TABLET BY MOUTH DAILY AS NEEDED FOR ANKLE SWELLING 30 tablet 2  . HYDROcodone-acetaminophen (NORCO/VICODIN) 5-325 MG per tablet Take 1 tablet by mouth every 8 (eight) hours as needed for  moderate pain. 90 tablet 0  . montelukast (SINGULAIR) 10 MG tablet Take 1 tablet (10 mg total) by mouth at bedtime. 30 tablet 3  . OLANZapine (ZYPREXA) 5 MG tablet Take 1 tablet (5 mg total) by mouth at bedtime. 90 tablet 1  . temazepam (RESTORIL) 15 MG capsule TAKE ONE CAPSULE BY MOUTH NIGHTLY AT BEDTIME AS NEEDED FOR SLEEP  30 capsule 3   No current facility-administered medications for this visit.     Allergies (verified) Sulfa antibiotics   PAST HISTORY  Family History Family History  Problem Relation Age of Onset  . Uterine cancer    . Pancreatic cancer    . Heart attack Father   . Depression Mother   . Diabetes Father   . Hypertension      parents    Social History History  Substance Use Topics  . Smoking status: Former Smoker    Quit date: 02/12/2014  . Smokeless tobacco: Not on file  . Alcohol Use: No     Are there smokers in your home (other than you)? No  Risk Factors Current exercise habits: The patient does not participate in regular exercise at present.  Dietary issues discussed: DASH   Cardiac risk factors: advanced age (older than 25 for men, 55 for women), dyslipidemia, obesity (BMI >= 30 kg/m2) and sedentary lifestyle.  Depression Screen (Note: if answer to either of the following is "Yes", a more complete depression screening is indicated)   Over the past two weeks, have you felt down, depressed or hopeless? No  Over the past two weeks, have you felt little interest or pleasure in doing things? No  Have you lost interest or pleasure in daily life? No  Do you often feel hopeless? No  Do you cry easily over simple problems? Yes  Activities of Daily Living In your present state of health, do you have any difficulty performing the following activities?:  Driving? No Managing money?  No Feeding yourself? No Getting from bed to chair? No Climbing a flight of stairs? No Preparing food and eating?: No Bathing or showering? No Getting dressed:  No Getting to the toilet? No Using the toilet:No Moving around from place to place: No In the past year have you fallen or had a near fall?:No   Are you sexually active?  No  Do you have more than one partner?  No  Hearing Difficulties: Yes Do you often ask people to speak up or repeat themselves? Yes Do you experience ringing or noises in your ears? No Do you have difficulty understanding soft or whispered voices? Yes   Do you feel that you have a problem with memory? No  Do you often misplace items? No  Do you feel safe at home?  Yes  Cognitive Testing  Alert? Yes  Normal Appearance?Yes  Oriented to person? Yes  Place? Yes   Time? Yes  Recall of three objects?  Yes  Can perform simple calculations? Yes  Displays appropriate judgment?Yes  Can read the correct time from a watch face?Yes   Advanced Directives have been discussed with the  patient? Yes  List the Names of Other Physician/Practitioners you currently use: 1.  Dr. Loyal Jacobson any recent Medical Services you may have received from other than [facility] providers in the past year (date may be approximate).  Immunization History  Administered Date(s) Administered  . Pneumococcal Conjugate-13 01/12/2014  . Pneumococcal Polysaccharide-23 04/29/2003  . Zoster 05/22/2012    Screening Tests Health Maintenance  Topic Date Due  . FOOT EXAM  02/02/1956  . OPHTHALMOLOGY EXAM  [birth year]02[birth year]1954  . URINE MICROALBUMIN  02[birth year]02/1954  . TETANUS/TDAP  02/03/1963  . COLONOSCOPY  02/02/1994  . DEXA SCAN  02/02/2009  . HEMOGLOBIN A1C  07/30/2014  . INFLUENZA VACCINE  11/23/2014  . PNA vac Low Risk Adult (2 of 2 - PPSV23) 01/13/2015  . MAMMOGRAM  10/03/2015  . ZOSTAVAX  Completed    All answers were reviewed with the patient and necessary referrals were made:  Marcial Pacas, DO   09/23/2014   History reviewed: allergies, current medications, past family history, past medical history, past social history, past surgical history and  problem list  Review of Systems Review of Systems - General ROS: negative for - chills, fever, night sweats, weight gain or weight loss Ophthalmic ROS: negative for - decreased vision Psychological ROS: negative for - anxiety or depression ENT ROS: negative for - nasal congestion, tinnitus or allergies Hematological and Lymphatic ROS: negative for - bleeding problems, bruising or swollen lymph nodes Breast ROS: negative Respiratory ROS: no cough, shortness of breath, or wheezing Cardiovascular ROS: no chest pain or dyspnea on exertion Gastrointestinal ROS: no abdominal pain, change in bowel habits, or black or bloody stools Genito-Urinary ROS: negative for - genital discharge, genital ulcers, incontinence or abnormal bleeding from genitals Musculoskeletal ROS: negative for - new muscular pain Neurological ROS: negative for - headaches or memory loss Dermatological ROS: negative for lumps, mole changes, rash and skin lesion changes   Objective:     Vision by Snellen chart: right eye:20/25, left eye:20/40  Body mass index is 43.76 kg/(m^2). BP 122/73 mmHg  Pulse 82  Ht 5\' 6"  (1.676 m)  Wt 271 lb (122.925 kg)  BMI 43.76 kg/m2  General: No Acute Distress HEENT: Atraumatic, normocephalic, conjunctivae normal without scleral icterus.  No nasal discharge, hearing grossly intact, TMs with good landmarks bilaterally with no middle ear abnormalities, posterior pharynx clear without oral lesions. Neck: Supple, trachea midline, no cervical nor supraclavicular adenopathy. Pulmonary: Clear to auscultation bilaterally without wheezing, rhonchi, nor rales. Cardiac: Regular rate and rhythm.  No murmurs, rubs, nor gallops. No peripheral edema.  2+ peripheral pulses bilaterally. Abdomen: Bowel sounds normal.  No masses.  Non-tender without rebound.  Negative Murphy's sign. MSK: Grossly intact, no signs of weakness.  Full strength throughout upper and lower extremities.  Full ROM in upper and lower  extremities.  No midline spinal tenderness. Neuro: Gait unremarkable, CN II-XII grossly intact.  C5-C6 Reflex 2/4 Bilaterally, L4 Reflex 2/4 Bilaterally.  Cerebellar function intact. Skin: No rashes. Psych: Alert and oriented to person/place/time.  Thought process normal. No anxiety/depression.      Assessment:     Hearing loss with desire for hearing aids     Plan:     During the course of the visit the patient was educated and counseled about appropriate screening and preventive services including:    Pneumococcal vaccine   Td vaccine  Screening mammography  Bone densitometry screening  Diet review for nutrition referral? Not indicated  Her daughter, whom the patient resides with, is  in the process of applying to adopt a new child. The adoption agency has asked for urinalysis, HIV testing, hepatitis B testing, and PPD placement. I've asked her to return on Friday to check her PPD.  Patient Instructions (the written plan) was given to the patient.  Medicare Attestation I have personally reviewed: The patient's medical and social history Their use of alcohol, tobacco or illicit drugs Their current medications and supplements The patient's functional ability including ADLs,fall risks, home safety risks, cognitive, and hearing and visual impairment Diet and physical activities Evidence for depression or mood disorders  The patient's weight, height, BMI, and visual acuity have been recorded in the chart.  I have made referrals, counseling, and provided education to the patient based on review of the above and I have provided the patient with a written personalized care plan for preventive services.     Marcial Pacas, DO   09/23/2014

## 2014-09-24 LAB — URINALYSIS, MICROSCOPIC ONLY
CRYSTALS: NONE SEEN
Casts: NONE SEEN

## 2014-09-24 LAB — URINALYSIS, ROUTINE W REFLEX MICROSCOPIC
BILIRUBIN URINE: NEGATIVE
GLUCOSE, UA: NEGATIVE mg/dL
Hgb urine dipstick: NEGATIVE
Ketones, ur: NEGATIVE mg/dL
Nitrite: NEGATIVE
PH: 5 (ref 5.0–8.0)
PROTEIN: NEGATIVE mg/dL
SPECIFIC GRAVITY, URINE: 1.007 (ref 1.005–1.030)
UROBILINOGEN UA: 0.2 mg/dL (ref 0.0–1.0)

## 2014-09-24 LAB — COMPREHENSIVE METABOLIC PANEL
ALT: 17 U/L (ref 0–35)
AST: 18 U/L (ref 0–37)
Albumin: 3.8 g/dL (ref 3.5–5.2)
Alkaline Phosphatase: 144 U/L — ABNORMAL HIGH (ref 39–117)
BUN: 15 mg/dL (ref 6–23)
CHLORIDE: 94 meq/L — AB (ref 96–112)
CO2: 26 meq/L (ref 19–32)
Calcium: 9 mg/dL (ref 8.4–10.5)
Creat: 0.73 mg/dL (ref 0.50–1.10)
GLUCOSE: 329 mg/dL — AB (ref 70–99)
POTASSIUM: 4.1 meq/L (ref 3.5–5.3)
Sodium: 133 mEq/L — ABNORMAL LOW (ref 135–145)
Total Bilirubin: 0.5 mg/dL (ref 0.2–1.2)
Total Protein: 6.4 g/dL (ref 6.0–8.3)

## 2014-09-24 LAB — HIV ANTIBODY (ROUTINE TESTING W REFLEX): HIV 1&2 Ab, 4th Generation: NONREACTIVE

## 2014-09-24 LAB — LIPID PANEL
CHOLESTEROL: 113 mg/dL (ref 0–200)
HDL: 44 mg/dL — AB (ref >=46)
LDL CALC: 41 mg/dL (ref 0–99)
TRIGLYCERIDES: 141 mg/dL (ref <150)
Total CHOL/HDL Ratio: 2.6 Ratio
VLDL: 28 mg/dL (ref 0–40)

## 2014-09-25 LAB — TB SKIN TEST
Induration: 0 mm
TB Skin Test: NEGATIVE

## 2014-09-28 ENCOUNTER — Telehealth: Payer: Self-pay | Admitting: Family Medicine

## 2014-09-28 DIAGNOSIS — D72829 Elevated white blood cell count, unspecified: Secondary | ICD-10-CM

## 2014-09-28 NOTE — Telephone Encounter (Signed)
Seth Bake, Will you please let patient know that her cholesterol, kidney function, liver function were all normal.  Her blood sugar was mildly elevated but not surprising given her known type 2 diabetes. The only significant abnormality was a mildly elevated white blood cell count.  I'd recommend she have this rechecked in one week, lab slip in your inbox.  Family Services forms in your inbox ready for pickup.

## 2014-09-28 NOTE — Telephone Encounter (Signed)
Left message on vm

## 2014-10-05 ENCOUNTER — Other Ambulatory Visit: Payer: Self-pay | Admitting: Family Medicine

## 2014-10-06 ENCOUNTER — Other Ambulatory Visit: Payer: Self-pay | Admitting: Family Medicine

## 2014-10-07 ENCOUNTER — Encounter: Payer: Self-pay | Admitting: Sports Medicine

## 2014-10-07 ENCOUNTER — Ambulatory Visit (INDEPENDENT_AMBULATORY_CARE_PROVIDER_SITE_OTHER): Payer: Medicare Other | Admitting: Sports Medicine

## 2014-10-07 DIAGNOSIS — Z6841 Body Mass Index (BMI) 40.0 and over, adult: Secondary | ICD-10-CM | POA: Insufficient documentation

## 2014-10-07 DIAGNOSIS — M4716 Other spondylosis with myelopathy, lumbar region: Secondary | ICD-10-CM | POA: Diagnosis not present

## 2014-10-07 DIAGNOSIS — M170 Bilateral primary osteoarthritis of knee: Secondary | ICD-10-CM

## 2014-10-07 MED ORDER — TRAMADOL HCL 50 MG PO TABS: ORAL_TABLET | ORAL | Status: DC

## 2014-10-07 MED ORDER — PHENTERMINE HCL 37.5 MG PO TABS: ORAL_TABLET | ORAL | Status: DC

## 2014-10-07 MED ORDER — GABAPENTIN 300 MG PO CAPS: ORAL_CAPSULE | ORAL | Status: DC

## 2014-10-07 NOTE — Assessment & Plan Note (Signed)
At this point she is failed steroidal injections, as well as a full course of Visco supplementation. She now becomes a candidate for operative intervention.  Referral to [place]

## 2014-10-07 NOTE — Assessment & Plan Note (Signed)
At this point we have been through epidurals, physical therapy, oral medications. She did well initially with branch blocks for her facet joints, unfortunately she continues to have pain after a rate of 50 ablation. At this point we will proceed now to medical pain management. She is not a surgical candidate considering predominantly axial pain.  we will start with max dose tramadol, as well as gabapentin in and up taper.  she will also work aggressively on weight loss.

## 2014-10-07 NOTE — Progress Notes (Signed)
  Subjective:    CC: Follow-up  HPI: Posey has been through a lot, we have finished injections of steroid-induced as well as Orthovisc into both knees, she did well initially but unfortunately is now having recurrence of arthritic pain. She understands she is now a candidate for knee arthroplasty.  Lumbar spondylosis: Has been through physical therapy, multiple epidurals, more recently she responded well to facet injections, medial branch blocks, but for some reason did not respond to radio frequency ablation, not even temporarily.  Obesity: Understands that part of her back and knee pain is due to her weight, she is amenable to start some weight loss treatments.  Past medical history, Surgical history, Family history not pertinant except as noted below, Social history, Allergies, and medications have been entered into the medical record, reviewed, and no changes needed.   Review of Systems: No fevers, chills, night sweats, weight loss, chest pain, or shortness of breath.   Objective:    General: Well Developed, well nourished, and in no acute distress.  Neuro: Alert and oriented x3, extra-ocular muscles intact, sensation grossly intact.  HEENT: Normocephalic, atraumatic, pupils equal round reactive to light, neck supple, no masses, no lymphadenopathy, thyroid nonpalpable.  Skin: Warm and dry, no rashes. Cardiac: Regular rate and rhythm, no murmurs rubs or gallops, no lower extremity edema.  Respiratory: Clear to auscultation bilaterally. Not using accessory muscles, speaking in full sentences.  Impression and Recommendations:

## 2014-10-07 NOTE — Assessment & Plan Note (Signed)
Starting phentermine.  she does have a clinical diagnosis of diabetes mellitus type 2, Saxenda will be difficult to improve however she would be a candidate for Victoza which has the same mechanism, and can help her lose significant weight.  she will follow-up with her PCP regarding weight loss.

## 2014-10-11 ENCOUNTER — Other Ambulatory Visit: Payer: Self-pay | Admitting: Family Medicine

## 2014-10-12 ENCOUNTER — Ambulatory Visit (HOSPITAL_BASED_OUTPATIENT_CLINIC_OR_DEPARTMENT_OTHER)
Admission: RE | Admit: 2014-10-12 | Discharge: 2014-10-12 | Disposition: A | Discharge status: Home / Self Care | Payer: Medicare Other | Source: Ambulatory Visit | Attending: Family Medicine | Admitting: Family Medicine

## 2014-10-12 ENCOUNTER — Ambulatory Visit (HOSPITAL_BASED_OUTPATIENT_CLINIC_OR_DEPARTMENT_OTHER): Payer: Medicare Other

## 2014-10-12 DIAGNOSIS — Z780 Asymptomatic menopausal state: Secondary | ICD-10-CM | POA: Insufficient documentation

## 2014-10-12 DIAGNOSIS — E1151 Type 2 diabetes mellitus with diabetic peripheral angiopathy without gangrene: Secondary | ICD-10-CM | POA: Diagnosis not present

## 2014-10-12 DIAGNOSIS — Z0000 Encounter for general adult medical examination without abnormal findings: Secondary | ICD-10-CM | POA: Diagnosis not present

## 2014-10-12 DIAGNOSIS — Z13820 Encounter for screening for osteoporosis: Secondary | ICD-10-CM | POA: Diagnosis not present

## 2014-10-12 DIAGNOSIS — I739 Peripheral vascular disease, unspecified: Secondary | ICD-10-CM | POA: Diagnosis not present

## 2014-10-12 DIAGNOSIS — M79674 Pain in right toe(s): Secondary | ICD-10-CM | POA: Diagnosis not present

## 2014-10-12 DIAGNOSIS — Z1231 Encounter for screening mammogram for malignant neoplasm of breast: Secondary | ICD-10-CM | POA: Diagnosis not present

## 2014-10-12 DIAGNOSIS — B351 Tinea unguium: Secondary | ICD-10-CM | POA: Diagnosis not present

## 2014-10-12 DIAGNOSIS — L84 Corns and callosities: Secondary | ICD-10-CM | POA: Diagnosis not present

## 2014-10-16 ENCOUNTER — Other Ambulatory Visit: Payer: Self-pay | Admitting: Family Medicine

## 2014-10-20 DIAGNOSIS — D72829 Elevated white blood cell count, unspecified: Secondary | ICD-10-CM | POA: Diagnosis not present

## 2014-10-21 LAB — CBC WITH DIFFERENTIAL/PLATELET
BASOS ABS: 0.1 10*3/uL (ref 0.0–0.1)
BASOS PCT: 1 % (ref 0–1)
Eosinophils Absolute: 0.3 10*3/uL (ref 0.0–0.7)
Eosinophils Relative: 4 % (ref 0–5)
HCT: 41.9 % (ref 36.0–46.0)
HEMOGLOBIN: 13.5 g/dL (ref 12.0–15.0)
Lymphocytes Relative: 15 % (ref 12–46)
Lymphs Abs: 1.3 10*3/uL (ref 0.7–4.0)
MCH: 26.8 pg (ref 26.0–34.0)
MCHC: 32.2 g/dL (ref 30.0–36.0)
MCV: 83.3 fL (ref 78.0–100.0)
MPV: 11 fL (ref 8.6–12.4)
Monocytes Absolute: 0.7 10*3/uL (ref 0.1–1.0)
Monocytes Relative: 8 % (ref 3–12)
NEUTROS ABS: 6 10*3/uL (ref 1.7–7.7)
Neutrophils Relative %: 72 % (ref 43–77)
PLATELETS: 249 10*3/uL (ref 150–400)
RBC: 5.03 MIL/uL (ref 3.87–5.11)
RDW: 14.9 % (ref 11.5–15.5)
WBC: 8.4 10*3/uL (ref 4.0–10.5)

## 2014-10-23 ENCOUNTER — Telehealth: Payer: Self-pay | Admitting: *Deleted

## 2014-10-23 MED ORDER — BUDESONIDE-FORMOTEROL FUMARATE 160-4.5 MCG/ACT IN AERO: 2.0000 | INHALATION_SPRAY | Freq: Two times a day (BID) | RESPIRATORY_TRACT | Status: DC

## 2014-10-23 NOTE — Telephone Encounter (Signed)
Pt needs refill on symbicort. Have to rout since we have not ever rx'ed it

## 2014-10-23 NOTE — Telephone Encounter (Signed)
Rx sent to [facility] in [place]

## 2014-10-23 NOTE — Telephone Encounter (Signed)
Notified daughter via vm

## 2014-11-06 ENCOUNTER — Ambulatory Visit: Payer: Medicare Other | Admitting: Sports Medicine

## 2014-11-08 ENCOUNTER — Other Ambulatory Visit: Payer: Self-pay | Admitting: Family Medicine

## 2014-11-10 ENCOUNTER — Other Ambulatory Visit: Payer: Self-pay | Admitting: Family Medicine

## 2014-11-12 ENCOUNTER — Other Ambulatory Visit: Payer: Self-pay | Admitting: Sports Medicine

## 2014-11-17 ENCOUNTER — Encounter: Payer: Self-pay | Admitting: Sports Medicine

## 2014-11-17 ENCOUNTER — Other Ambulatory Visit: Payer: Self-pay | Admitting: Family Medicine

## 2014-11-17 ENCOUNTER — Ambulatory Visit (INDEPENDENT_AMBULATORY_CARE_PROVIDER_SITE_OTHER): Payer: Medicare Other | Admitting: Sports Medicine

## 2014-11-17 DIAGNOSIS — M47896 Other spondylosis, lumbar region: Secondary | ICD-10-CM | POA: Diagnosis not present

## 2014-11-17 MED ORDER — PHENTERMINE HCL 37.5 MG PO TABS: ORAL_TABLET | ORAL | Status: DC

## 2014-11-17 MED ORDER — LIRAGLUTIDE 18 MG/3ML ~~LOC~~ SOPN: PEN_INJECTOR | SUBCUTANEOUS | Status: DC

## 2014-11-17 NOTE — Assessment & Plan Note (Signed)
At this point we have been through epidurals, physical therapy, oral medications. She did well initially with medial branch blocks for her facet joints but continued to have pain after her RFA. We have now been doing medical pain management with gabapentin and tramadol and she seems to be doing very well. No changes. She has improved significantly with weight loss as below, so we will continue this aggressively.

## 2014-11-17 NOTE — Progress Notes (Signed)
  Subjective:    CC: Follow-up  HPI: Obesity: Good weight loss, has not yet started Victoza  Lumbar spondylosis: Has been through epidurals, PT, oral medications, facet RFA, still has pain, overall this is improved significantly with gabapentin, tramadol, and aggressive weight loss.  Past medical history, Surgical history, Family history not pertinant except as noted below, Social history, Allergies, and medications have been entered into the medical record, reviewed, and no changes needed.   Review of Systems: No fevers, chills, night sweats, weight loss, chest pain, or shortness of breath.   Objective:    General: Well Developed, well nourished, and in no acute distress.  Neuro: Alert and oriented x3, extra-ocular muscles intact, sensation grossly intact.  HEENT: Normocephalic, atraumatic, pupils equal round reactive to light, neck supple, no masses, no lymphadenopathy, thyroid nonpalpable.  Skin: Warm and dry, no rashes. Cardiac: Regular rate and rhythm, no murmurs rubs or gallops, no lower extremity edema.  Respiratory: Clear to auscultation bilaterally. Not using accessory muscles, speaking in full sentences.  Impression and Recommendations:    I spent 25 minutes with this patient, greater than 50% was face-to-face time counseling regarding the above diagnoses

## 2014-11-17 NOTE — Assessment & Plan Note (Signed)
Almost a 20 pound weight loss on phentermine. She has not yet gotten her Victoza. She'll return in one month for a weight check, she can also come back for nurse visit to learn how to do the Victoza injections, she doesn't extend that we may have to try a different GLP-1 type medication if this is not covered.

## 2014-11-20 ENCOUNTER — Other Ambulatory Visit: Payer: Self-pay | Admitting: Sports Medicine

## 2014-11-23 ENCOUNTER — Other Ambulatory Visit: Payer: Self-pay | Admitting: Family Medicine

## 2014-11-23 NOTE — Telephone Encounter (Signed)
Per insurance, Pt needs a 90day Rx. Will route to PCP for review.

## 2014-11-24 MED ORDER — TEMAZEPAM 15 MG PO CAPS: ORAL_CAPSULE | ORAL | Status: DC

## 2014-11-24 NOTE — Telephone Encounter (Signed)
Andrea, Rx placed in in-box ready for pickup/faxing.  

## 2014-11-25 ENCOUNTER — Telehealth: Payer: Self-pay | Admitting: Family Medicine

## 2014-11-25 NOTE — Telephone Encounter (Signed)
Received fax from [place] and they approved coverage on Temazepam from 08/27/2014 - 04/24/2015. - CF

## 2014-11-25 NOTE — Telephone Encounter (Signed)
Received fax for prior authorization on Temazepam sent through cover my meds waiting on authorization. - CF

## 2014-12-05 ENCOUNTER — Other Ambulatory Visit: Payer: Self-pay | Admitting: Family Medicine

## 2014-12-10 DIAGNOSIS — Z23 Encounter for immunization: Secondary | ICD-10-CM | POA: Diagnosis not present

## 2014-12-14 ENCOUNTER — Other Ambulatory Visit: Payer: Self-pay | Admitting: *Deleted

## 2014-12-14 MED ORDER — ALPRAZOLAM 1 MG PO TABS: 1.0000 mg | ORAL_TABLET | Freq: Two times a day (BID) | ORAL | Status: DC | PRN

## 2014-12-14 MED ORDER — OLANZAPINE 5 MG PO TABS: 5.0000 mg | ORAL_TABLET | Freq: Every day | ORAL | Status: DC

## 2014-12-18 ENCOUNTER — Ambulatory Visit (INDEPENDENT_AMBULATORY_CARE_PROVIDER_SITE_OTHER): Payer: Medicare Other | Admitting: Sports Medicine

## 2014-12-18 ENCOUNTER — Encounter: Payer: Self-pay | Admitting: Sports Medicine

## 2014-12-18 ENCOUNTER — Other Ambulatory Visit: Payer: Self-pay | Admitting: Sports Medicine

## 2014-12-18 DIAGNOSIS — M47896 Other spondylosis, lumbar region: Secondary | ICD-10-CM | POA: Diagnosis not present

## 2014-12-18 MED ORDER — PHENTERMINE HCL 37.5 MG PO TABS: 37.5000 mg | ORAL_TABLET | Freq: Every day | ORAL | Status: DC

## 2014-12-18 MED ORDER — HYDROCODONE-ACETAMINOPHEN 5-325 MG PO TABS: 1.0000 | ORAL_TABLET | Freq: Three times a day (TID) | ORAL | Status: DC | PRN

## 2014-12-18 NOTE — Assessment & Plan Note (Signed)
Initially did very well with L3-4 and L4-5 radiofrequency ablation. Unfortunately having a recurrence of pain, this occurred as a strain. She was doing well with gabapentin and tramadol, I am going to increase the hydrocodone for a few days, and give her rehabilitation exercises.  She understands that after she runs out we will be dropping back down to tramadol. Ultimately we will not be proceeding with any more interventions, and simply with non-narcotic medical pain management as well as aggressive weight loss.

## 2014-12-18 NOTE — Assessment & Plan Note (Signed)
Approximately 30 pound weight loss so far as we into the third month of phentermine. This has been improving her back pain significantly. Unfortunate she was not aware that she needs to increase from 0.6 mg of Victoza, refilling phentermine, she will go on a taper of Victoza. Return in one month for weight check.

## 2014-12-18 NOTE — Progress Notes (Signed)
  Subjective:    CC: follow-up  HPI:  this is a pleasant 69 year old female with spine osteoarthritis,she did extremely well initially with an L3-L4 right and L4-L5 facet ablation, unfortunately had a recurrence of pain and so we decided to proceed simply with aggressive weight loss and non-narcotic pharmacologic pain management.  she was initially doing well on gabapentin and tramadol, recently strained her back and need something a bit stronger for a few days.  Obesity: Good continued weight loss.she is doing the Victoza injection but unfortunately has only remained at 0.6 mg.  Past medical history, Surgical history, Family history not pertinant except as noted below, Social history, Allergies, and medications have been entered into the medical record, reviewed, and no changes needed.   Review of Systems: No fevers, chills, night sweats, weight loss, chest pain, or shortness of breath.   Objective:    General: Well Developed, well nourished, and in no acute distress.  Neuro: Alert and oriented x3, extra-ocular muscles intact, sensation grossly intact.  HEENT: Normocephalic, atraumatic, pupils equal round reactive to light, neck supple, no masses, no lymphadenopathy, thyroid nonpalpable.  Skin: Warm and dry, no rashes. Cardiac: Regular rate and rhythm, no murmurs rubs or gallops, no lower extremity edema.  Respiratory: Clear to auscultation bilaterally. Not using accessory muscles, speaking in full sentences. Back Exam:  Inspection: Unremarkable  Motion: Flexion 45 deg, Extension 45 deg, Side Bending to 45 deg bilaterally,  Rotation to 45 deg bilaterally  SLR laying: Negative  XSLR laying: Negative  Palpable tenderness: None. FABER: negative. Sensory change: Gross sensation intact to all lumbar and sacral dermatomes.  Reflexes: 2+ at both patellar tendons, 2+ at achilles tendons, Babinski's downgoing.  Strength at foot  Plantar-flexion: 5/5 Dorsi-flexion: 5/5 Eversion: 5/5 Inversion:  5/5  Leg strength  Quad: 5/5 Hamstring: 5/5 Hip flexor: 5/5 Hip abductors: 5/5  Gait unremarkable.  Impression and Recommendations:    I spent 25 minutes with this patient, greater than 50% was face-to-face time counseling regarding the above diagnoses

## 2014-12-22 ENCOUNTER — Other Ambulatory Visit: Payer: Self-pay | Admitting: Sports Medicine

## 2014-12-31 ENCOUNTER — Ambulatory Visit: Payer: Medicare Other | Attending: Audiology | Admitting: Audiology

## 2014-12-31 DIAGNOSIS — H9193 Unspecified hearing loss, bilateral: Secondary | ICD-10-CM | POA: Diagnosis not present

## 2014-12-31 DIAGNOSIS — R42 Dizziness and giddiness: Secondary | ICD-10-CM | POA: Diagnosis not present

## 2014-12-31 DIAGNOSIS — H903 Sensorineural hearing loss, bilateral: Secondary | ICD-10-CM

## 2014-12-31 NOTE — Procedures (Signed)
[facility] [address] [place], [place]  [address] [place] EVALUATION  Patient Name: Sarah Wright  Medical Record Number:  [id number] Date of Birth:  1945-09-25     Date of Test:  12/31/2014  HISTORY:  Sarah Wright, a 69 y.o. old female was seen for audiological evaluation upon referral of Hommel, Sean, DO.   The patient reported a gradual decrease of hearing over the past year accompanied by occasional bilateral tinnitus.  Further, she reports frequent headaches, (daily for the past three weeks following a flu shot) dizziness that requires her to walk with assistance (not accompanied with nausea/vomiting tho she does experience sweating sometimes).  She was unsure of how long the dizzy episodes have been going on, but felt she has been bothered for at least one year. She does not report facial numbness, familial history of hearing loss or excessive noise exposure.  Lastly, she has a history of migraines, "weak knees and a bad lower back".  However,  She does not feel her balance problems are related to her knees or her back and says there seem to be no known provocation of the dizzy spells that last from 2-10 min.  She can not relate them to any specific postural position.  Sarah Wright required assistance from the lobby to the audiology office and experienced two bouts of dizziness during the evaluation.  Neither were accompanied with nausea, vomiting, sweating or nystagmus.  She stated that she felt as if she were moving/swaying in her chair and not the room.  She also wondered if she was in the initial stages of a migraine.  REPORT OF PAIN:  None  EVALUATION:   Air and bone conduction audiometry from 500Hz  - 8000Hz  utilizing insert earphones revealed a bilateral mild to severe high frequency sensory neural hearing loss.   Speech reception thresholds were consistent with the pure tone results indicative of good test  reliability.  Speech recognition testing was conducted in each ear independently, at a comfortable listening level (60-6dBHL) and indicated 100% and 100% in the right and left ears respectively.  Speech recognition abilities while in the presence of a soft background noise (s/n+5) were also evaluated.  Under this condition, scores were 100% in the right and 76% in the left ear.   Immittance Audiometry was utilized and Type A Tympanograms were obtained on the the right and left sides indicating normal ear canal volume, tympanic membrane compliance and pressure.  Acoustic reflexes were tested from 500Hz  - 2000Hz  and were present on the right side and present on the left side.  Distortion Product Otoacoustic Emissions were tested from 2000Hz  - 10000Hz  and were weak or absent on the right side above 6000Hz  and weak or absent on the left side above 3000Hz , indicative of poor outer hair cell function of the in the inner ear within those higher frequency ranges .  CONCLUSION:   The patient has normal hearing through 2000Hz , with a mild to severe high frequency sensory neural loss bilaterally.  DPOAEs are consistent with the high frequency sensory loss.  Normal speech recognition is observed in quiet and drops to moderately impaired on the left side only, when a background noise is present.  Normal middle ear functioning is present bilaterally.  RECOMMENDATIONS:    1. Of primary concern is Sarah Wright's dizziness/balance issues.  An ENT evaluation for medical management and PT assessment for rehabiliation is strongly recommended, as soon as possible.  Salomon Mast, PT  at [facility] has been recommended for working with vestibular/balance/PT concerns.  Contact information is: Phone: [phone]  Address: [address], [place], [place] [address] 2. Sarah Wright hearing should be monitored at least on an annual basis and sooner should there be an increase in symptoms.  3. As normal pure tones  are observed through 2000Hz ; a hearing aid evaluation is not recommended at this.  Although not a hearing aid candidate at present, there are ways to improve one's listening environment. Such as:  (A) When in a restaurant, sit with your back against a perimeter wall, thus reducing the extraneous noise.   (B) When in meetings, do not sit near an open doorway and position yourself within 8-10ft of the speaker. Front and center is typically best.  (C) Eliminate background noises at home such as the television, dishwasher, etc when having important conversations.  (D) Ask family members to face you when they speak and not to drop their heads or turn and walkway while still speaking.     Ivonne Andrew Manar Smalling, Au.D. Doctor of Audiology CCC-A 12/31/2014

## 2015-01-01 NOTE — Patient Instructions (Signed)
RECOMMENDATIONS:    1. Of primary concern is Sarah Wright's dizziness/balance issues.  An ENT evaluation for medical management and PT assessment for rehabiliation is strongly recommended, as soon as possible.  Salomon Mast, PT at [facility] has been recommended for working with vestibular/balance/PT concerns.  Contact information is: Phone: [phone]  Address: [address], [place], [place] [address] 2. Sarah Wright hearing should be monitored at least on an annual basis and sooner should there be an increase in symptoms.  3. As normal pure tones are observed through 2000Hz ; a hearing aid evaluation is not recommended at this.  Although not a hearing aid candidate at present, there are ways to improve one's listening environment. Such as:  (A) When in a restaurant, sit with your back against a perimeter wall, thus reducing the extraneous noise.   (B) When in meetings, do not sit near an open doorway and position yourself within 8-10ft of the speaker. Front and center is typically best.  (C) Eliminate background noises at home such as the television, dishwasher, etc when having important conversations.  (D) Ask family members to face you when they speak and not to drop their heads or turn and walkway while still speaking.     Ivonne Andrew Gavrielle Streck, Au.D. Doctor of Audiology CCC-A 12/31/2014

## 2015-01-04 ENCOUNTER — Telehealth: Payer: Self-pay | Admitting: Family Medicine

## 2015-01-04 DIAGNOSIS — M79609 Pain in unspecified limb: Secondary | ICD-10-CM | POA: Diagnosis not present

## 2015-01-04 DIAGNOSIS — B351 Tinea unguium: Secondary | ICD-10-CM | POA: Diagnosis not present

## 2015-01-04 DIAGNOSIS — L84 Corns and callosities: Secondary | ICD-10-CM | POA: Diagnosis not present

## 2015-01-04 DIAGNOSIS — R42 Dizziness and giddiness: Secondary | ICD-10-CM | POA: Insufficient documentation

## 2015-01-04 DIAGNOSIS — I739 Peripheral vascular disease, unspecified: Secondary | ICD-10-CM | POA: Diagnosis not present

## 2015-01-04 NOTE — Telephone Encounter (Signed)
Pt.notified

## 2015-01-04 NOTE — Telephone Encounter (Signed)
Seth Bake, Will you please let patient know that the audiology doctor has recommended a ENT and Physical therapy referral for her dizziness and I have placed orders for this.

## 2015-01-14 ENCOUNTER — Encounter: Payer: Self-pay | Admitting: Family Medicine

## 2015-01-14 ENCOUNTER — Ambulatory Visit (INDEPENDENT_AMBULATORY_CARE_PROVIDER_SITE_OTHER): Payer: Medicare Other | Admitting: Family Medicine

## 2015-01-14 VITALS — BP 130/63 | HR 82 | Wt 257.0 lb

## 2015-01-14 DIAGNOSIS — R42 Dizziness and giddiness: Secondary | ICD-10-CM | POA: Diagnosis not present

## 2015-01-14 DIAGNOSIS — R55 Syncope and collapse: Secondary | ICD-10-CM | POA: Diagnosis not present

## 2015-01-14 MED ORDER — ALPRAZOLAM 1 MG PO TABS: 1.0000 mg | ORAL_TABLET | Freq: Two times a day (BID) | ORAL | Status: DC | PRN

## 2015-01-14 NOTE — Patient Instructions (Signed)
Can you please ask your daughter if your two have been contacted about scheduling a physical therapy appointment with Sarah Wright, PT at [facility]?

## 2015-01-14 NOTE — Progress Notes (Signed)
CC: Sarah Wright is a 69 y.o. female is here for losing balance   Subjective: HPI:  Patient complains of dizziness that has been becoming more frequent and severe over the past month. It happens a few times a week. She tells me every episode is identical other than the severity worsening. She describes it as a sudden onset of imbalance where she begins leaning to the left, always to the left. She had an episode earlier this week where she was unable to steady herself and fell to the ground. Within seconds her dizziness was gone after she hit the ground. She did not hit her head. She did not sustain any permanent injury. She denies any irregular heartbeat or chest pain surrounding her dizziness. Usually lasts only a matter of seconds. She is unable to reproduce the dizziness. She denies any other motor or sensory disturbances recently or remotely. Denies weakness, numbness, speech disturbance, nor confusion. Denies nasal congestion, sinus pressure, ear pain, nor sore throat.   Review Of Systems Outlined In HPI  Past Medical History  Diagnosis Date  . Hyperlipidemia   . Thyroid disease     Past Surgical History  Procedure Laterality Date  . Thyroidectomy, partial      right side removed  . Abdominal hysterectomy    . Cervical fusion    . Carpal tunnel release      both wrists  . Gallbladder surgery    . Right foot surgery    . Cataract extraction, bilateral     Family History  Problem Relation Age of Onset  . Uterine cancer    . Pancreatic cancer    . Heart attack Father   . Depression Mother   . Diabetes Father   . Hypertension      parents    Social History   Social History  . Marital Status: Divorced    Spouse Name: N/A  . Number of Children: N/A  . Years of Education: N/A   Occupational History  . Not on file.   Social History Main Topics  . Smoking status: Former Smoker    Quit date: 02/12/2014  . Smokeless tobacco: Not on file  . Alcohol Use: No  . Drug Use:  No  . Sexual Activity: Not Currently   Other Topics Concern  . Not on file   Social History Narrative     Objective: BP 130/63 mmHg  Pulse 82  Wt 257 lb (116.574 kg)  General: Alert and Oriented, No Acute Distress HEENT: Pupils equal, round, reactive to light. Conjunctivae clear.  External ears unremarkable, canals clear with intact TMs with appropriate landmarks.  Middle ear appears open without effusion. Pharynx unremarkable.  Lungs: Clear to auscultation bilaterally, no wheezing/ronchi/rales.  Comfortable work of breathing. Good air movement. Cardiac: Regular rate and rhythm. Normal S1/S2.  No murmurs, rubs, nor gallops.   Neuro: Cranial nerves II through XII grossly intact, normal gait.  Extremities: No peripheral edema.  Strong peripheral pulses.  Mental Status: No depression, anxiety, nor agitation. Skin: Warm and dry.  Assessment & Plan: Sarah Wright was seen today for losing balance.  Diagnoses and all orders for this visit:  Dizziness -     MR Brain Wo Contrast -     US Carotid Duplex Bilateral  Syncope, unspecified syncope type  Other orders -     ALPRAZolam (XANAX) 1 MG tablet; Take 1 tablet (1 mg total) by mouth 2 (two) times daily as needed. for anxiety   Dizziness: Physical therapy referral  has already been placed. Ear nose and throat visit has been scheduled. Discussed I think it's wise to go through with these referrals however I would like to get a MRI of the brain and carotid ultrasound to look into possible ischemic etiology to her sudden dizziness and falls. Low suspicion for cardiac etiology given that she always experiences episodes falling to the left however we'll keep in mind that she might need a echocardiogram and Holter monitor if the above results are unremarkable. Continue with aspirin daily.   Requested refill of Xanax   Return if symptoms worsen or fail to improve.

## 2015-01-15 ENCOUNTER — Telehealth: Payer: Self-pay | Admitting: *Deleted

## 2015-01-15 ENCOUNTER — Other Ambulatory Visit: Payer: Self-pay | Admitting: Sports Medicine

## 2015-01-15 MED ORDER — DIAZEPAM 10 MG PO TABS: ORAL_TABLET | ORAL | Status: DC

## 2015-01-15 NOTE — Telephone Encounter (Signed)
rx faxed and pt notified.

## 2015-01-15 NOTE — Telephone Encounter (Signed)
Andrea, Rx placed in in-box ready for pickup/faxing.  

## 2015-01-15 NOTE — Telephone Encounter (Signed)
Pt has been scheduled for an MRI on Monday at 3 pm and needs premeds for claustrophobia

## 2015-01-18 ENCOUNTER — Encounter: Payer: Self-pay | Admitting: Sports Medicine

## 2015-01-18 ENCOUNTER — Ambulatory Visit (INDEPENDENT_AMBULATORY_CARE_PROVIDER_SITE_OTHER): Payer: Medicare Other

## 2015-01-18 ENCOUNTER — Ambulatory Visit (INDEPENDENT_AMBULATORY_CARE_PROVIDER_SITE_OTHER): Payer: Medicare Other | Admitting: Sports Medicine

## 2015-01-18 DIAGNOSIS — M47896 Other spondylosis, lumbar region: Secondary | ICD-10-CM | POA: Diagnosis not present

## 2015-01-18 DIAGNOSIS — R42 Dizziness and giddiness: Secondary | ICD-10-CM | POA: Diagnosis not present

## 2015-01-18 DIAGNOSIS — E785 Hyperlipidemia, unspecified: Secondary | ICD-10-CM | POA: Diagnosis not present

## 2015-01-18 DIAGNOSIS — E079 Disorder of thyroid, unspecified: Secondary | ICD-10-CM

## 2015-01-18 MED ORDER — PHENTERMINE HCL 37.5 MG PO TABS: 18.7500 mg | ORAL_TABLET | Freq: Two times a day (BID) | ORAL | Status: DC

## 2015-01-18 MED ORDER — TOPIRAMATE 50 MG PO TABS: ORAL_TABLET | ORAL | Status: DC

## 2015-01-18 MED ORDER — TRAMADOL HCL 50 MG PO TABS: 50.0000 mg | ORAL_TABLET | Freq: Three times a day (TID) | ORAL | Status: DC

## 2015-01-18 NOTE — Assessment & Plan Note (Signed)
Initially did very well with L3-4 and L4-5 radiofrequency ablation. Unfortunately having a recurrence of pain, this occurred as a strain. She was doing well with gabapentin and tramadol, needs a refill on tramadol. Ultimately we will not be proceeding with any more interventions, and simply with non-narcotic medical pain management as well as aggressive weight loss.

## 2015-01-18 NOTE — Assessment & Plan Note (Signed)
There has been a bit of a plateau increase in weight. We are going to refill phentermine and switch to one half tab twice a day, I'm going to add Topamax and she will continue her Victoza, she is currently at 1.8 mg daily

## 2015-01-18 NOTE — Progress Notes (Signed)
  Subjective:    CC: Follow-up  HPI: Obesity: Weight has plateaued, she's actually gained a bit, is having a bit of increasing swelling in her legs without any orthopnea or paroxysmal nocturnal dyspnea. No chest pain. No shortness of breath. She is currently at 1.8 mg of Victoza, and continues to use her phentermine.  Low back pain: Fairly steady, but again agrees to work predominantly on weight loss before considering anything else interventional or invasive for her lumbar spine.  Past medical history, Surgical history, Family history not pertinant except as noted below, Social history, Allergies, and medications have been entered into the medical record, reviewed, and no changes needed.   Review of Systems: No fevers, chills, night sweats, weight loss, chest pain, or shortness of breath.   Objective:    General: Well Developed, well nourished, and in no acute distress.  Neuro: Alert and oriented x3, extra-ocular muscles intact, sensation grossly intact.  HEENT: Normocephalic, atraumatic, pupils equal round reactive to light, neck supple, no masses, no lymphadenopathy, thyroid nonpalpable.  Skin: Warm and dry, no rashes. Cardiac: Regular rate and rhythm, no murmurs rubs or gallops, no lower extremity edema.  Respiratory: Clear to auscultation bilaterally. Not using accessory muscles, speaking in full sentences.  Impression and Recommendations:

## 2015-01-19 ENCOUNTER — Telehealth: Payer: Self-pay | Admitting: Family Medicine

## 2015-01-19 DIAGNOSIS — I6501 Occlusion and stenosis of right vertebral artery: Secondary | ICD-10-CM

## 2015-01-19 NOTE — Telephone Encounter (Signed)
Pt.notified

## 2015-01-19 NOTE — Telephone Encounter (Signed)
Seth Bake, Will you please let patient know that her MRI did not show any signs of recent or past stroke.  The only significant abnormality that could be causing her dizziness was what looks to be a narrowed vertebral artery. I've placed an order for an ultrasound to get a better look at this.  I've placed it at [facility] long. I also sent this order to the referral folder.

## 2015-01-21 ENCOUNTER — Other Ambulatory Visit (HOSPITAL_COMMUNITY): Payer: Medicare Other

## 2015-01-21 DIAGNOSIS — R42 Dizziness and giddiness: Secondary | ICD-10-CM | POA: Diagnosis not present

## 2015-01-22 ENCOUNTER — Other Ambulatory Visit (HOSPITAL_COMMUNITY): Payer: Medicare Other

## 2015-01-22 ENCOUNTER — Telehealth: Payer: Self-pay | Admitting: Family Medicine

## 2015-01-22 ENCOUNTER — Ambulatory Visit (HOSPITAL_COMMUNITY)
Admission: RE | Admit: 2015-01-22 | Discharge: 2015-01-22 | Disposition: A | Discharge status: Home / Self Care | Payer: Medicare Other | Source: Ambulatory Visit | Attending: Family Medicine | Admitting: Family Medicine

## 2015-01-22 DIAGNOSIS — R42 Dizziness and giddiness: Secondary | ICD-10-CM | POA: Diagnosis not present

## 2015-01-22 DIAGNOSIS — I6501 Occlusion and stenosis of right vertebral artery: Secondary | ICD-10-CM | POA: Diagnosis not present

## 2015-01-22 DIAGNOSIS — R269 Unspecified abnormalities of gait and mobility: Secondary | ICD-10-CM | POA: Diagnosis not present

## 2015-01-22 NOTE — Progress Notes (Signed)
VASCULAR LAB PRELIMINARY  PRELIMINARY  PRELIMINARY  PRELIMINARY  Bilateral transcranial Doppler  completed.    Preliminary report:  TCD completed  SLAUGHTER, [place], RVS 01/22/2015, 11:12 AM

## 2015-01-22 NOTE — Telephone Encounter (Signed)
Sarah Wright, Will you please let patient know that her neck ultrasound was very reassuring and saw no signs of any narrowing in any of her arteries.  The findings on the MRI were misleading given how small the vertebral arteries are even in normal individuals.

## 2015-01-22 NOTE — Telephone Encounter (Signed)
Pt's daughter notified.

## 2015-01-25 DIAGNOSIS — R2689 Other abnormalities of gait and mobility: Secondary | ICD-10-CM | POA: Diagnosis not present

## 2015-01-25 DIAGNOSIS — H9313 Tinnitus, bilateral: Secondary | ICD-10-CM | POA: Diagnosis not present

## 2015-01-25 DIAGNOSIS — R42 Dizziness and giddiness: Secondary | ICD-10-CM | POA: Diagnosis not present

## 2015-01-25 DIAGNOSIS — Z87891 Personal history of nicotine dependence: Secondary | ICD-10-CM | POA: Diagnosis not present

## 2015-01-25 DIAGNOSIS — H903 Sensorineural hearing loss, bilateral: Secondary | ICD-10-CM | POA: Diagnosis not present

## 2015-01-26 ENCOUNTER — Encounter: Payer: Self-pay | Admitting: Family Medicine

## 2015-01-26 DIAGNOSIS — H903 Sensorineural hearing loss, bilateral: Secondary | ICD-10-CM | POA: Insufficient documentation

## 2015-02-01 DIAGNOSIS — H903 Sensorineural hearing loss, bilateral: Secondary | ICD-10-CM | POA: Diagnosis not present

## 2015-02-01 DIAGNOSIS — R269 Unspecified abnormalities of gait and mobility: Secondary | ICD-10-CM | POA: Diagnosis not present

## 2015-02-01 DIAGNOSIS — R296 Repeated falls: Secondary | ICD-10-CM | POA: Diagnosis not present

## 2015-02-04 ENCOUNTER — Other Ambulatory Visit: Payer: Self-pay | Admitting: Family Medicine

## 2015-02-09 ENCOUNTER — Other Ambulatory Visit: Payer: Self-pay | Admitting: Family Medicine

## 2015-02-11 DIAGNOSIS — H903 Sensorineural hearing loss, bilateral: Secondary | ICD-10-CM | POA: Diagnosis not present

## 2015-02-11 DIAGNOSIS — R296 Repeated falls: Secondary | ICD-10-CM | POA: Diagnosis not present

## 2015-02-11 DIAGNOSIS — R269 Unspecified abnormalities of gait and mobility: Secondary | ICD-10-CM | POA: Diagnosis not present

## 2015-02-16 DIAGNOSIS — H903 Sensorineural hearing loss, bilateral: Secondary | ICD-10-CM | POA: Diagnosis not present

## 2015-02-16 DIAGNOSIS — R269 Unspecified abnormalities of gait and mobility: Secondary | ICD-10-CM | POA: Diagnosis not present

## 2015-02-16 DIAGNOSIS — R296 Repeated falls: Secondary | ICD-10-CM | POA: Diagnosis not present

## 2015-02-17 ENCOUNTER — Ambulatory Visit (INDEPENDENT_AMBULATORY_CARE_PROVIDER_SITE_OTHER): Payer: Medicare Other | Admitting: Sports Medicine

## 2015-02-17 ENCOUNTER — Encounter: Payer: Self-pay | Admitting: Sports Medicine

## 2015-02-17 VITALS — BP 124/62 | HR 88 | Ht 65.5 in | Wt 254.0 lb

## 2015-02-17 DIAGNOSIS — M7122 Synovial cyst of popliteal space [Baker], left knee: Secondary | ICD-10-CM | POA: Diagnosis not present

## 2015-02-17 DIAGNOSIS — I6501 Occlusion and stenosis of right vertebral artery: Secondary | ICD-10-CM

## 2015-02-17 DIAGNOSIS — M170 Bilateral primary osteoarthritis of knee: Secondary | ICD-10-CM

## 2015-02-17 MED ORDER — PHENTERMINE HCL 37.5 MG PO TABS: 37.5000 mg | ORAL_TABLET | Freq: Every day | ORAL | Status: DC

## 2015-02-17 MED ORDER — TOPIRAMATE 100 MG PO TABS: 100.0000 mg | ORAL_TABLET | Freq: Every day | ORAL | Status: DC

## 2015-02-17 NOTE — Assessment & Plan Note (Signed)
Aspiration of Baker's cyst as above.  Return in one month.

## 2015-02-17 NOTE — Progress Notes (Signed)
  Subjective:    CC: Weight check  HPI: Sarah Wright returns, unfortunately she is still not lost any weight. She is doing phentermine, 50 mg of Topamax, and Victoza.  Left knee pain: History of Baker's cysts, feels as though she has a recurrence, pain is moderate, persistent and localized in the posterior knee joint with swelling. No mechanical symptoms.  Past medical history, Surgical history, Family history not pertinant except as noted below, Social history, Allergies, and medications have been entered into the medical record, reviewed, and no changes needed.   Review of Systems: No fevers, chills, night sweats, weight loss, chest pain, or shortness of breath.   Objective:    General: Well Developed, well nourished, and in no acute distress.  Neuro: Alert and oriented x3, extra-ocular muscles intact, sensation grossly intact.  HEENT: Normocephalic, atraumatic, pupils equal round reactive to light, neck supple, no masses, no lymphadenopathy, thyroid nonpalpable.  Skin: Warm and dry, no rashes. Cardiac: Regular rate and rhythm, no murmurs rubs or gallops, no lower extremity edema.  Respiratory: Clear to auscultation bilaterally. Not using accessory muscles, speaking in full sentences. Left Knee: Normal to inspection with no erythema or effusion or obvious bony abnormalities. There is a palpable mass at the posterior medial joint line at the crux between the semitendinosus and gastrocnemius medial head. ROM normal in flexion and extension and lower leg rotation. Ligaments with solid consistent endpoints including ACL, PCL, LCL, MCL. Negative Mcmurray's and provocative meniscal tests. Non painful patellar compression. Patellar and quadriceps tendons unremarkable. Hamstring and quadriceps strength is normal.  Procedure: Real-time Ultrasound Guided aspiration/injection of left knee Baker's cyst Device: GE Logiq E  Verbal informed consent obtained.  Time-out conducted.  Noted no overlying  erythema, induration, or other signs of local infection.  Skin prepped in a sterile fashion.  Local anesthesia: Topical Ethyl chloride.  With sterile technique and under real time ultrasound guidance:  Noted 4 centimeter Baker's cyst between similar member notices and gastrocnemius, 18-gauge needle advanced into the cyst, 5 mL clear fluid aspirated, syringe switched and 1 mL kenalog 40, 1 mL lidocaine injected easily. Completed without difficulty  Pain immediately resolved suggesting accurate placement of the medication.  Advised to call if fevers/chills, erythema, induration, drainage, or persistent bleeding.  Images permanently stored and available for review in the ultrasound unit.  Impression: Technically successful ultrasound guided injection.  Impression and Recommendations:

## 2015-02-17 NOTE — Assessment & Plan Note (Signed)
Now has gained a pound, continue Topamax, Victoza, phentermine, she will take a full tablet a little bit later in the day and we will set her up with nutrition.

## 2015-02-18 ENCOUNTER — Encounter: Payer: Self-pay | Admitting: Sports Medicine

## 2015-02-19 ENCOUNTER — Encounter: Payer: Self-pay | Admitting: Sports Medicine

## 2015-02-24 ENCOUNTER — Other Ambulatory Visit: Payer: Self-pay | Admitting: Sports Medicine

## 2015-02-24 DIAGNOSIS — H903 Sensorineural hearing loss, bilateral: Secondary | ICD-10-CM | POA: Diagnosis not present

## 2015-02-24 DIAGNOSIS — R296 Repeated falls: Secondary | ICD-10-CM | POA: Diagnosis not present

## 2015-02-24 DIAGNOSIS — R269 Unspecified abnormalities of gait and mobility: Secondary | ICD-10-CM | POA: Diagnosis not present

## 2015-03-02 DIAGNOSIS — R350 Frequency of micturition: Secondary | ICD-10-CM | POA: Diagnosis not present

## 2015-03-02 DIAGNOSIS — N909 Noninflammatory disorder of vulva and perineum, unspecified: Secondary | ICD-10-CM | POA: Diagnosis not present

## 2015-03-02 DIAGNOSIS — Z01411 Encounter for gynecological examination (general) (routine) with abnormal findings: Secondary | ICD-10-CM | POA: Diagnosis not present

## 2015-03-02 DIAGNOSIS — R3915 Urgency of urination: Secondary | ICD-10-CM | POA: Diagnosis not present

## 2015-03-03 DIAGNOSIS — D071 Carcinoma in situ of vulva: Secondary | ICD-10-CM | POA: Diagnosis not present

## 2015-03-03 DIAGNOSIS — N909 Noninflammatory disorder of vulva and perineum, unspecified: Secondary | ICD-10-CM | POA: Diagnosis not present

## 2015-03-06 ENCOUNTER — Other Ambulatory Visit: Payer: Self-pay | Admitting: Sports Medicine

## 2015-03-06 ENCOUNTER — Other Ambulatory Visit: Payer: Self-pay | Admitting: Family Medicine

## 2015-03-15 ENCOUNTER — Other Ambulatory Visit: Payer: Self-pay | Admitting: Sports Medicine

## 2015-03-15 DIAGNOSIS — M5136 Other intervertebral disc degeneration, lumbar region: Secondary | ICD-10-CM | POA: Diagnosis not present

## 2015-03-15 DIAGNOSIS — Z90710 Acquired absence of both cervix and uterus: Secondary | ICD-10-CM | POA: Diagnosis not present

## 2015-03-15 DIAGNOSIS — E118 Type 2 diabetes mellitus with unspecified complications: Secondary | ICD-10-CM | POA: Diagnosis not present

## 2015-03-15 DIAGNOSIS — G4733 Obstructive sleep apnea (adult) (pediatric): Secondary | ICD-10-CM | POA: Diagnosis not present

## 2015-03-15 DIAGNOSIS — F418 Other specified anxiety disorders: Secondary | ICD-10-CM | POA: Diagnosis not present

## 2015-03-15 DIAGNOSIS — Z8042 Family history of malignant neoplasm of prostate: Secondary | ICD-10-CM | POA: Diagnosis not present

## 2015-03-15 DIAGNOSIS — L292 Pruritus vulvae: Secondary | ICD-10-CM | POA: Diagnosis not present

## 2015-03-15 DIAGNOSIS — G4700 Insomnia, unspecified: Secondary | ICD-10-CM | POA: Diagnosis not present

## 2015-03-15 DIAGNOSIS — Z8049 Family history of malignant neoplasm of other genital organs: Secondary | ICD-10-CM | POA: Diagnosis not present

## 2015-03-15 DIAGNOSIS — Z87891 Personal history of nicotine dependence: Secondary | ICD-10-CM | POA: Diagnosis not present

## 2015-03-15 DIAGNOSIS — I10 Essential (primary) hypertension: Secondary | ICD-10-CM | POA: Diagnosis not present

## 2015-03-15 DIAGNOSIS — N903 Dysplasia of vulva, unspecified: Secondary | ICD-10-CM | POA: Diagnosis not present

## 2015-03-15 DIAGNOSIS — J449 Chronic obstructive pulmonary disease, unspecified: Secondary | ICD-10-CM | POA: Diagnosis not present

## 2015-03-15 DIAGNOSIS — Z7982 Long term (current) use of aspirin: Secondary | ICD-10-CM | POA: Diagnosis not present

## 2015-03-15 DIAGNOSIS — E079 Disorder of thyroid, unspecified: Secondary | ICD-10-CM | POA: Diagnosis not present

## 2015-03-15 DIAGNOSIS — M170 Bilateral primary osteoarthritis of knee: Secondary | ICD-10-CM | POA: Diagnosis not present

## 2015-03-15 DIAGNOSIS — D071 Carcinoma in situ of vulva: Secondary | ICD-10-CM | POA: Diagnosis not present

## 2015-03-15 DIAGNOSIS — M47816 Spondylosis without myelopathy or radiculopathy, lumbar region: Secondary | ICD-10-CM | POA: Diagnosis not present

## 2015-03-15 DIAGNOSIS — R632 Polyphagia: Secondary | ICD-10-CM | POA: Diagnosis not present

## 2015-03-16 ENCOUNTER — Other Ambulatory Visit: Payer: Self-pay | Admitting: Sports Medicine

## 2015-03-17 ENCOUNTER — Ambulatory Visit (INDEPENDENT_AMBULATORY_CARE_PROVIDER_SITE_OTHER): Payer: Medicare Other | Admitting: Sports Medicine

## 2015-03-17 ENCOUNTER — Encounter: Payer: Self-pay | Admitting: Sports Medicine

## 2015-03-17 DIAGNOSIS — I6501 Occlusion and stenosis of right vertebral artery: Secondary | ICD-10-CM

## 2015-03-17 DIAGNOSIS — M170 Bilateral primary osteoarthritis of knee: Secondary | ICD-10-CM | POA: Diagnosis not present

## 2015-03-17 NOTE — Assessment & Plan Note (Signed)
Continue Victoza, Topamax, she is now seeing a bariatric specialist, as well as a nutritionist. The are recommending that phentermine be discontinued, further management through bariatrics.

## 2015-03-17 NOTE — Assessment & Plan Note (Signed)
Good response to aspiration and injection of the Baker's cyst the last visi symptoms resolved. Return as needed for this.t,

## 2015-03-17 NOTE — Progress Notes (Signed)
  Subjective:    CC: Follow-up  HPI: Obesity: No weight loss after the last month, continues with her Victoza and Topamax, discontinued phentermine. She is currently seeing a bariatric specialist now.  Fall: Fell/tripped accidentally into a bush, just has a little scrape on the outside of her ankle. No other injuries.  Past medical history, Surgical history, Family history not pertinant except as noted below, Social history, Allergies, and medications have been entered into the medical record, reviewed, and no changes needed.   Review of Systems: No fevers, chills, night sweats, weight loss, chest pain, or shortness of breath.   Objective:    General: Well Developed, well nourished, and in no acute distress.  Neuro: Alert and oriented x3, extra-ocular muscles intact, sensation grossly intact.  HEENT: Normocephalic, atraumatic, pupils equal round reactive to light, neck supple, no masses, no lymphadenopathy, thyroid nonpalpable.  Skin: Warm and dry, no rashes. Cardiac: Regular rate and rhythm, no murmurs rubs or gallops, no lower extremity edema.  Respiratory: Clear to auscultation bilaterally. Not using accessory muscles, speaking in full sentences. Right ankle: Small abrasion that is not infected and not bleeding, small Band-Aid placed. No bony tenderness.  Impression and Recommendations:

## 2015-03-24 ENCOUNTER — Other Ambulatory Visit: Payer: Self-pay | Admitting: Sports Medicine

## 2015-03-24 DIAGNOSIS — D071 Carcinoma in situ of vulva: Secondary | ICD-10-CM | POA: Diagnosis not present

## 2015-03-25 DIAGNOSIS — E669 Obesity, unspecified: Secondary | ICD-10-CM | POA: Diagnosis not present

## 2015-03-25 DIAGNOSIS — E119 Type 2 diabetes mellitus without complications: Secondary | ICD-10-CM | POA: Diagnosis not present

## 2015-03-25 DIAGNOSIS — Z882 Allergy status to sulfonamides status: Secondary | ICD-10-CM | POA: Diagnosis not present

## 2015-03-25 DIAGNOSIS — R0602 Shortness of breath: Secondary | ICD-10-CM | POA: Diagnosis not present

## 2015-03-25 DIAGNOSIS — G43909 Migraine, unspecified, not intractable, without status migrainosus: Secondary | ICD-10-CM | POA: Diagnosis not present

## 2015-03-25 DIAGNOSIS — Z6841 Body Mass Index (BMI) 40.0 and over, adult: Secondary | ICD-10-CM | POA: Diagnosis not present

## 2015-03-25 DIAGNOSIS — G4733 Obstructive sleep apnea (adult) (pediatric): Secondary | ICD-10-CM | POA: Diagnosis not present

## 2015-03-25 DIAGNOSIS — Z7984 Long term (current) use of oral hypoglycemic drugs: Secondary | ICD-10-CM | POA: Diagnosis not present

## 2015-03-25 DIAGNOSIS — Z87891 Personal history of nicotine dependence: Secondary | ICD-10-CM | POA: Diagnosis not present

## 2015-03-25 DIAGNOSIS — Z794 Long term (current) use of insulin: Secondary | ICD-10-CM | POA: Diagnosis not present

## 2015-03-25 DIAGNOSIS — D071 Carcinoma in situ of vulva: Secondary | ICD-10-CM | POA: Diagnosis not present

## 2015-03-25 DIAGNOSIS — J45909 Unspecified asthma, uncomplicated: Secondary | ICD-10-CM | POA: Diagnosis not present

## 2015-03-25 DIAGNOSIS — N909 Noninflammatory disorder of vulva and perineum, unspecified: Secondary | ICD-10-CM | POA: Diagnosis not present

## 2015-03-25 DIAGNOSIS — J449 Chronic obstructive pulmonary disease, unspecified: Secondary | ICD-10-CM | POA: Diagnosis not present

## 2015-03-25 DIAGNOSIS — Z79899 Other long term (current) drug therapy: Secondary | ICD-10-CM | POA: Diagnosis not present

## 2015-03-25 DIAGNOSIS — K219 Gastro-esophageal reflux disease without esophagitis: Secondary | ICD-10-CM | POA: Diagnosis not present

## 2015-03-25 DIAGNOSIS — F329 Major depressive disorder, single episode, unspecified: Secondary | ICD-10-CM | POA: Diagnosis not present

## 2015-03-25 DIAGNOSIS — E785 Hyperlipidemia, unspecified: Secondary | ICD-10-CM | POA: Diagnosis not present

## 2015-03-30 DIAGNOSIS — M2042 Other hammer toe(s) (acquired), left foot: Secondary | ICD-10-CM | POA: Diagnosis not present

## 2015-03-30 DIAGNOSIS — I739 Peripheral vascular disease, unspecified: Secondary | ICD-10-CM | POA: Diagnosis not present

## 2015-03-30 DIAGNOSIS — L603 Nail dystrophy: Secondary | ICD-10-CM | POA: Diagnosis not present

## 2015-03-30 DIAGNOSIS — E1151 Type 2 diabetes mellitus with diabetic peripheral angiopathy without gangrene: Secondary | ICD-10-CM | POA: Diagnosis not present

## 2015-03-30 DIAGNOSIS — M2041 Other hammer toe(s) (acquired), right foot: Secondary | ICD-10-CM | POA: Diagnosis not present

## 2015-04-11 ENCOUNTER — Other Ambulatory Visit: Payer: Self-pay | Admitting: Sports Medicine

## 2015-04-13 ENCOUNTER — Encounter: Payer: Self-pay | Admitting: Sports Medicine

## 2015-04-13 DIAGNOSIS — D071 Carcinoma in situ of vulva: Secondary | ICD-10-CM | POA: Diagnosis not present

## 2015-04-13 DIAGNOSIS — M47816 Spondylosis without myelopathy or radiculopathy, lumbar region: Secondary | ICD-10-CM

## 2015-04-13 DIAGNOSIS — Z9889 Other specified postprocedural states: Secondary | ICD-10-CM | POA: Diagnosis not present

## 2015-05-02 ENCOUNTER — Other Ambulatory Visit: Payer: Self-pay | Admitting: Family Medicine

## 2015-05-03 ENCOUNTER — Ambulatory Visit: Payer: Medicare Other | Admitting: Family Medicine

## 2015-05-06 ENCOUNTER — Ambulatory Visit (INDEPENDENT_AMBULATORY_CARE_PROVIDER_SITE_OTHER): Payer: Medicare Other | Admitting: Family Medicine

## 2015-05-06 ENCOUNTER — Encounter: Payer: Self-pay | Admitting: Family Medicine

## 2015-05-06 VITALS — BP 110/68 | HR 81 | Wt 256.0 lb

## 2015-05-06 DIAGNOSIS — R42 Dizziness and giddiness: Secondary | ICD-10-CM | POA: Diagnosis not present

## 2015-05-06 DIAGNOSIS — M1712 Unilateral primary osteoarthritis, left knee: Secondary | ICD-10-CM

## 2015-05-06 DIAGNOSIS — E119 Type 2 diabetes mellitus without complications: Secondary | ICD-10-CM

## 2015-05-06 LAB — POCT GLYCOSYLATED HEMOGLOBIN (HGB A1C): HEMOGLOBIN A1C: 7.8

## 2015-05-06 MED ORDER — LIRAGLUTIDE 18 MG/3ML ~~LOC~~ SOPN: PEN_INJECTOR | SUBCUTANEOUS | Status: DC

## 2015-05-06 NOTE — Progress Notes (Signed)
CC: Sarah Wright is a 70 y.o. female is here for Hyperglycemia   Subjective: HPI:   follow-up type 2 diabetes:  She ran out of Victoza  Month a half ago. 3 months ago her A1c was 6.7 while taking this medication on a daily basis. No outside blood sugars to report this. She denies polyuria plication or polydipsia. No poorly healing wounds.   she continues to have dizziness most days of the week despite going to physical therapy. The worse her knee hurts the worse her dizziness is. She says despite getting steroid injections, viscosupplementation,  And physical therapy her knee is still bothering her on a daily basis. She was seen by ear nose and throat who believes that her dizziness is due to weakness in the lower extremities rather than a vestibular problem. She denies any chest pain shortness of breath or peripheral edema.denies hearing loss or any other motor or sensory disturbance.   Review Of Systems Outlined In HPI  Past Medical History  Diagnosis Date  . Hyperlipidemia   . Thyroid disease     Past Surgical History  Procedure Laterality Date  . Thyroidectomy, partial      right side removed  . Abdominal hysterectomy    . Cervical fusion    . Carpal tunnel release      both wrists  . Gallbladder surgery    . Right foot surgery    . Cataract extraction, bilateral     Family History  Problem Relation Age of Onset  . Uterine cancer    . Pancreatic cancer    . Heart attack Father   . Depression Mother   . Diabetes Father   . Hypertension      parents    Social History   Social History  . Marital Status: Divorced    Spouse Name: N/A  . Number of Children: N/A  . Years of Education: N/A   Occupational History  . Not on file.   Social History Main Topics  . Smoking status: Former Smoker    Quit date: 02/12/2014  . Smokeless tobacco: Not on file  . Alcohol Use: No  . Drug Use: No  . Sexual Activity: Not Currently   Other Topics Concern  . Not on file    Social History Narrative     Objective: BP 110/68 mmHg  Pulse 81  Wt 256 lb (116.121 kg)  General: Alert and Oriented, No Acute Distress HEENT: Pupils equal, round, reactive to light. Conjunctivae clear.   Moist mucous membranes Lungs: Clear to auscultation bilaterally, no wheezing/ronchi/rales.  Comfortable work of breathing. Good air movement. Cardiac: Regular rate and rhythm. Normal S1/S2.  No murmurs, rubs, nor gallops.   Extremities: No peripheral edema.  Strong peripheral pulses.  Mental Status: No depression, anxiety, nor agitation. Skin: Warm and dry.  Assessment & Plan: Sarah Wright was seen today for hyperglycemia.  Diagnoses and all orders for this visit:  Dizziness  Type 2 diabetes mellitus without complication, without long-term current use of insulin (HCC) -     Liraglutide (VICTOZA) 18 MG/3ML SOPN; INJECT 0.6 MG SUBCUTANEOUSLY ONCE DAILY INCREASING BY 0.6 MG WEEKLY UNTIL 1.8MG  DAILY  Primary osteoarthritis of left knee -     Ambulatory referral to Orthopedic Surgery    type 2 diabetes: A1c of 7.8 today, uncontrolled, restarting daily Victoza.  Dizziness is still felt to be due to  Weakness and pain in the lower extremities. Referral to orthopedics  To see if she is now a candidate  for knee replacement.  25 minutes spent face-to-face during visit today of which at least 50% was counseling or coordinating care regarding: 1. Dizziness   2. Type 2 diabetes mellitus without complication, without long-term current use of insulin ([facility])   3. Primary osteoarthritis of left knee      Return in about 3 months (around 08/04/2015).

## 2015-05-06 NOTE — Addendum Note (Signed)
Addended by: Delrae Alfred on: 05/06/2015 10:55 AM   Modules accepted: Orders

## 2015-05-07 ENCOUNTER — Encounter: Payer: Self-pay | Admitting: Sports Medicine

## 2015-05-12 DIAGNOSIS — D071 Carcinoma in situ of vulva: Secondary | ICD-10-CM | POA: Diagnosis not present

## 2015-05-12 DIAGNOSIS — Z87412 Personal history of vulvar dysplasia: Secondary | ICD-10-CM | POA: Diagnosis not present

## 2015-05-12 DIAGNOSIS — Z9889 Other specified postprocedural states: Secondary | ICD-10-CM | POA: Diagnosis not present

## 2015-05-13 DIAGNOSIS — Z6841 Body Mass Index (BMI) 40.0 and over, adult: Secondary | ICD-10-CM | POA: Diagnosis not present

## 2015-05-14 ENCOUNTER — Other Ambulatory Visit: Payer: Self-pay | Admitting: Family Medicine

## 2015-05-18 DIAGNOSIS — G8929 Other chronic pain: Secondary | ICD-10-CM | POA: Diagnosis not present

## 2015-05-18 DIAGNOSIS — Z79899 Other long term (current) drug therapy: Secondary | ICD-10-CM | POA: Diagnosis not present

## 2015-05-18 DIAGNOSIS — M47816 Spondylosis without myelopathy or radiculopathy, lumbar region: Secondary | ICD-10-CM | POA: Diagnosis not present

## 2015-05-18 DIAGNOSIS — M25569 Pain in unspecified knee: Secondary | ICD-10-CM | POA: Diagnosis not present

## 2015-05-18 DIAGNOSIS — M5136 Other intervertebral disc degeneration, lumbar region: Secondary | ICD-10-CM | POA: Diagnosis not present

## 2015-05-18 DIAGNOSIS — Z79891 Long term (current) use of opiate analgesic: Secondary | ICD-10-CM | POA: Diagnosis not present

## 2015-05-26 ENCOUNTER — Other Ambulatory Visit: Payer: Self-pay | Admitting: Sports Medicine

## 2015-05-28 ENCOUNTER — Other Ambulatory Visit: Payer: Self-pay | Admitting: Sports Medicine

## 2015-05-28 ENCOUNTER — Other Ambulatory Visit: Payer: Self-pay | Admitting: Family Medicine

## 2015-06-07 ENCOUNTER — Encounter: Payer: Self-pay | Admitting: Osteopathic Medicine

## 2015-06-07 ENCOUNTER — Ambulatory Visit (INDEPENDENT_AMBULATORY_CARE_PROVIDER_SITE_OTHER): Payer: Medicare Other | Admitting: Osteopathic Medicine

## 2015-06-07 VITALS — BP 131/63 | HR 80 | Temp 97.8°F | Ht 65.0 in | Wt 252.0 lb

## 2015-06-07 DIAGNOSIS — J069 Acute upper respiratory infection, unspecified: Secondary | ICD-10-CM

## 2015-06-07 DIAGNOSIS — R05 Cough: Secondary | ICD-10-CM

## 2015-06-07 DIAGNOSIS — R059 Cough, unspecified: Secondary | ICD-10-CM

## 2015-06-07 DIAGNOSIS — J0120 Acute ethmoidal sinusitis, unspecified: Secondary | ICD-10-CM | POA: Diagnosis not present

## 2015-06-07 MED ORDER — AMOXICILLIN-POT CLAVULANATE 875-125 MG PO TABS: 1.0000 | ORAL_TABLET | Freq: Two times a day (BID) | ORAL | Status: DC

## 2015-06-07 MED ORDER — IPRATROPIUM BROMIDE 0.03 % NA SOLN
2.0000 | Freq: Two times a day (BID) | NASAL | Status: DC
Start: 2015-06-07 — End: 2015-06-17

## 2015-06-07 MED ORDER — BENZONATATE 200 MG PO CAPS
200.0000 mg | ORAL_CAPSULE | Freq: Three times a day (TID) | ORAL | Status: DC | PRN
Start: 2015-06-07 — End: 2015-06-17

## 2015-06-07 NOTE — Progress Notes (Signed)
HPI: Sarah Wright is a 70 y.o. female who presents to [facility]  today for chief complaint of:  Chief Complaint  Patient presents with  . Sinus Problem   . Location: sinuses  Quality: pressure and congestion  . Duration: 3 days, worse yesterday with headache.  . Modifying factors: has tried the following medications: none  without relief   Past medical, social and family history reviewed: Past Medical History  Diagnosis Date  . Hyperlipidemia   . Thyroid disease    Past Surgical History  Procedure Laterality Date  . Thyroidectomy, partial      right side removed  . Abdominal hysterectomy    . Cervical fusion    . Carpal tunnel release      both wrists  . Gallbladder surgery    . Right foot surgery    . Cataract extraction, bilateral     Social History  Substance Use Topics  . Smoking status: Former Smoker    Quit date: 02/12/2014  . Smokeless tobacco: Not on file  . Alcohol Use: No   Family History  Problem Relation Age of Onset  . Uterine cancer    . Pancreatic cancer    . Heart attack Father   . Depression Mother   . Diabetes Father   . Hypertension      parents    Current Outpatient Prescriptions  Medication Sig Dispense Refill  . ALPRAZolam (XANAX) 1 MG tablet Take 1 tablet (1 mg total) by mouth 2 (two) times daily as needed for anxiety. FOLLOW UP APPOINTMENT NEEDED FOR FURTHER REFILLS 60 tablet 0  . AMBULATORY NON FORMULARY MEDICATION Knee-high, medium compression, graduated compression stockings. Apply to lower extremities. [email or web address], Zippered Compression Stockings, medium circ, long length 1 each 0  . aspirin 81 MG tablet Take 81 mg by mouth daily.    [name] atorvastatin (LIPITOR) 20 MG tablet Take 1 tablet (20 mg total) by mouth daily. FOLLOW UP APPOINTMENT NEEDED FOR FURTHER REFILLS 30 tablet 0  . BD PEN NEEDLE NANO U/F 32G X 4 MM MISC USE WITH VICTOZA ONCE DAILY 100 each 11  . budesonide-formoterol  (SYMBICORT) 160-4.5 MCG/ACT inhaler Inhale 2 puffs into the lungs 2 (two) times daily. 1 Inhaler 11  . buPROPion (WELLBUTRIN XL) 150 MG 24 hr tablet TAKE ONE TABLET BY MOUTH EVERY MORNING 90 tablet 2  . citalopram (CELEXA) 40 MG tablet TAKE ONE TABLET BY MOUTH ONE TIME DAILY 90 tablet 3  . furosemide (LASIX) 20 MG tablet TAKE ONE TABLET BY MOUTH DAILY AS NEEDED FOR ANKLE SWELLING 30 tablet 2  . gabapentin (NEURONTIN) 300 MG capsule TAKE 1 CAP AT BEDTIME X1 WK,TWICE DAILY X1 WK, THEN 3 TIMES DAILY. MAY DOUBLE WEEKLY TO 3600MG /DAY 180 capsule 3  . Liraglutide (VICTOZA) 18 MG/3ML SOPN INJECT 0.6 MG SUBCUTANEOUSLY ONCE DAILY INCREASING BY 0.6 MG WEEKLY UNTIL 1.8MG  DAILY 9 pen 11  . montelukast (SINGULAIR) 10 MG tablet TAKE ONE TABLET BY MOUTH NIGHTLY AT BEDTIME 30 tablet 3  . OLANZapine (ZYPREXA) 5 MG tablet Take 1 tablet (5 mg total) by mouth at bedtime. 90 tablet 1  . SPIRIVA HANDIHALER 18 MCG inhalation capsule INHALE ONE CAPSULE VIA HANDIHALER ONE TIME DAILY 30 capsule 0  . temazepam (RESTORIL) 15 MG capsule TAKE ONE CAPSULE BY MOUTH NIGHTLY AT BEDTIME AS NEEDED FOR SLEEP 90 capsule 0  . topiramate (TOPAMAX) 100 MG tablet TAKE 1 TABLET BY MOUTH EVERY DAY 30 tablet 0  . traMADol (ULTRAM) 50 MG  tablet TAKE ONE TABLET BY MOUTH THREE TIMES DAILY. 180 tablet 0   No current facility-administered medications for this visit.   Allergies  Allergen Reactions  . Sulfa Antibiotics     Patient had reaction as a child; does not know what type of reaction      Review of Systems: CONSTITUTIONAL: no fever/chills HEAD/EYES/EARS/NOSE/THROAT: no headache, no vision change or hearing change, no sore throat CARDIAC: No chest pain/pressure/palpitations, no orthopnea RESPIRATORY: yes cough, no shortness of breath GASTROINTESTINAL: no nausea, no vomiting, no abdominal pain/blood in stool/diarrhea/constipation MUSCULOSKELETAL: no myalgia/arthralgia   Exam:  BP 131/63 mmHg  Pulse 80  Temp(Src) 97.8 F (36.6  C) (Oral)  Ht 5\' 5"  (1.651 m)  Wt 252 lb (114.306 kg)  BMI 41.93 kg/m2 Constitutional: VSS, see above. General Appearance: alert, well-developed, well-nourished, NAD Eyes: Normal lids and conjunctive, non-icteric sclera, PERRLA Ears, Nose, Mouth, Throat: Normal external inspection ears/nares/mouth/lips/gums, normal TM, MMM; posterior pharynx without erythema, without exudate Neck: No masses, trachea midline. No thyroid enlargement/tenderness/mass appreciated, normal lymph nodes Respiratory: Normal respiratory effort. No  wheeze/rhonchi/rales Cardiovascular: S1/S2 normal, no murmur/rub/gallop auscultated. RRR. No carotid bruit or JVD. No lower extremity edema.   No results found for this or any previous visit (from the past 72 hour(s)).    ASSESSMENT/PLAN: See pt instructions, fill abx if no better   Viral URI - Plan: ipratropium (ATROVENT) 0.03 % nasal spray  Cough - Plan: benzonatate (TESSALON) 200 MG capsule  Acute ethmoidal sinusitis, recurrence not specified - Plan: amoxicillin-clavulanate (AUGMENTIN) 875-125 MG tablet, ipratropium (ATROVENT) 0.03 % nasal spray    Return if symptoms worsen or fail to improve.

## 2015-06-07 NOTE — Patient Instructions (Signed)
Start antibiotics if you are feeling absolutely no improvement 5 - 7 days after you first felt sick, sooner if you are getting significantly worse.  Otherwise, nasal spray to help with congestion/headache, cough medicine as needed, continue your inhalers as usual, return to clinic if worse or any other concerns particularly severe productive cough you may need chest xray.

## 2015-06-14 ENCOUNTER — Other Ambulatory Visit: Payer: Self-pay | Admitting: Family Medicine

## 2015-06-15 DIAGNOSIS — E118 Type 2 diabetes mellitus with unspecified complications: Secondary | ICD-10-CM | POA: Diagnosis not present

## 2015-06-15 DIAGNOSIS — F509 Eating disorder, unspecified: Secondary | ICD-10-CM | POA: Diagnosis not present

## 2015-06-17 ENCOUNTER — Ambulatory Visit: Payer: Medicare Other | Admitting: Family Medicine

## 2015-06-17 ENCOUNTER — Ambulatory Visit (INDEPENDENT_AMBULATORY_CARE_PROVIDER_SITE_OTHER): Payer: Medicare Other | Admitting: Family Medicine

## 2015-06-17 ENCOUNTER — Encounter: Payer: Self-pay | Admitting: Family Medicine

## 2015-06-17 VITALS — BP 110/49 | HR 88 | Wt 252.0 lb

## 2015-06-17 DIAGNOSIS — M47816 Spondylosis without myelopathy or radiculopathy, lumbar region: Secondary | ICD-10-CM | POA: Diagnosis not present

## 2015-06-17 DIAGNOSIS — R222 Localized swelling, mass and lump, trunk: Secondary | ICD-10-CM

## 2015-06-17 DIAGNOSIS — G8929 Other chronic pain: Secondary | ICD-10-CM | POA: Diagnosis not present

## 2015-06-17 NOTE — Progress Notes (Signed)
Subjective:    Patient ID: Sarah Wright, female    DOB: 11/27/1945, 70 y.o.   MRN: [id number]  HPI pt reports that she noticed a lump on the L side of her neck a few days ago. she wonders if it has anything to do with the Victoza that she takes. she heard on a commercial that this could be a possible side effect. No significant pain or discomfort but it is a little bit tender if you press directly on it. She's been noticing that her bra strap has been slipping down on that left shoulder for the last month so she thinks that the lot may have been there for a while. She denies any fevers chills or night sweats. She denies any swollen lymph nodes or glands elsewhere. No nausea vomiting or diarrhea. No recent heartburn or GERD. She has been a little constipated but says she has some MiraLAX that time that she plans to start taking. She denies any injury or trauma to the area. She does have a prior history of partial thyroidectomy for benign lesion.    Review of Systems  BP 110/49 mmHg  Pulse 88  Wt 252 lb (114.306 kg)  SpO2 94%    Allergies  Allergen Reactions  . Sulfa Antibiotics     Patient had reaction as a child; does not know what type of reaction    Past Medical History  Diagnosis Date  . Hyperlipidemia   . Thyroid disease     Past Surgical History  Procedure Laterality Date  . Thyroidectomy, partial      right side removed  . Abdominal hysterectomy    . Cervical fusion    . Carpal tunnel release      both wrists  . Gallbladder surgery    . Right foot surgery    . Cataract extraction, bilateral      Social History   Social History  . Marital Status: Divorced    Spouse Name: N/A  . Number of Children: N/A  . Years of Education: N/A   Occupational History  . Not on file.   Social History Main Topics  . Smoking status: Former Smoker    Quit date: 02/12/2014  . Smokeless tobacco: Not on file  . Alcohol Use: No  . Drug Use: No  . Sexual Activity: Not  Currently   Other Topics Concern  . Not on file   Social History Narrative    Family History  Problem Relation Age of Onset  . Uterine cancer    . Pancreatic cancer    . Heart attack Father   . Depression Mother   . Diabetes Father   . Hypertension      parents    Outpatient Encounter Prescriptions as of 06/17/2015  Medication Sig  . ALPRAZolam (XANAX) 1 MG tablet Take 1 tablet (1 mg total) by mouth 2 (two) times daily as needed for anxiety. FOLLOW UP APPOINTMENT NEEDED FOR FURTHER REFILLS  . AMBULATORY NON FORMULARY MEDICATION Knee-high, medium compression, graduated compression stockings. Apply to lower extremities. [email or web address], Zippered Compression Stockings, medium circ, long length  . aspirin 81 MG tablet Take 81 mg by mouth daily.  [name] atorvastatin (LIPITOR) 20 MG tablet TAKE 1 TABLET (20 MG TOTAL) BY MOUTH DAILY. FOLLOW UP APPOINTMENT NEEDED FOR FURTHER REFILLS  . BD PEN NEEDLE NANO U/F 32G X 4 MM MISC USE WITH VICTOZA ONCE DAILY  . budesonide-formoterol (SYMBICORT) 160-4.5 MCG/ACT inhaler Inhale 2 puffs into the lungs 2 (two) times  daily.  . buPROPion (WELLBUTRIN XL) 150 MG 24 hr tablet TAKE ONE TABLET BY MOUTH EVERY MORNING  . citalopram (CELEXA) 40 MG tablet TAKE ONE TABLET BY MOUTH ONE TIME DAILY  . furosemide (LASIX) 20 MG tablet TAKE ONE TABLET BY MOUTH DAILY AS NEEDED FOR ANKLE SWELLING  . gabapentin (NEURONTIN) 300 MG capsule TAKE 1 CAP AT BEDTIME X1 WK,TWICE DAILY X1 WK, THEN 3 TIMES DAILY. MAY DOUBLE WEEKLY TO 3600MG /DAY  . Liraglutide (VICTOZA) 18 MG/3ML SOPN INJECT 0.6 MG SUBCUTANEOUSLY ONCE DAILY INCREASING BY 0.6 MG WEEKLY UNTIL 1.8MG  DAILY  . montelukast (SINGULAIR) 10 MG tablet TAKE ONE TABLET BY MOUTH NIGHTLY AT BEDTIME  . OLANZapine (ZYPREXA) 5 MG tablet Take 1 tablet (5 mg total) by mouth at bedtime.  [name] SPIRIVA HANDIHALER 18 MCG inhalation capsule INHALE ONE CAPSULE VIA HANDIHALER ONE TIME DAILY  . temazepam (RESTORIL) 15 MG capsule TAKE ONE  CAPSULE BY MOUTH NIGHTLY AT BEDTIME AS NEEDED FOR SLEEP  . topiramate (TOPAMAX) 100 MG tablet TAKE 1 TABLET BY MOUTH EVERY DAY  . traMADol (ULTRAM) 50 MG tablet TAKE ONE TABLET BY MOUTH THREE TIMES DAILY.  . [DISCONTINUED] amoxicillin-clavulanate (AUGMENTIN) 875-125 MG tablet Take 1 tablet by mouth 2 (two) times daily.  . [DISCONTINUED] benzonatate (TESSALON) 200 MG capsule Take 1 capsule (200 mg total) by mouth 3 (three) times daily as needed for cough.  . [DISCONTINUED] ipratropium (ATROVENT) 0.03 % nasal spray Place 2 sprays into both nostrils every 12 (twelve) hours.   No facility-administered encounter medications on file as of 06/17/2015.          Objective:   Physical Exam  Constitutional: She is oriented to person, place, and time. She appears well-developed and well-nourished.  HENT:  Head: Normocephalic and atraumatic.  Right Ear: External ear normal.  Left Ear: External ear normal.  Nose: Nose normal.  Mouth/Throat: Oropharynx is clear and moist.  TMs and canals are clear.   Eyes: Conjunctivae and EOM are normal. Pupils are equal, round, and reactive to light.  Neck: Neck supple. No thyromegaly present.  Cardiovascular: Normal rate, regular rhythm and normal heart sounds.   Pulmonary/Chest: Effort normal and breath sounds normal. She has no wheezes.  Abdominal: Soft. Bowel sounds are normal. She exhibits no distension and no mass. There is tenderness. There is no rebound and no guarding.  Generalized tenderness on exam.  Musculoskeletal: She exhibits no edema.  Lymphadenopathy:       Head (right side): No submental, no submandibular, no tonsillar, no preauricular, no posterior auricular and no occipital adenopathy present.       Head (left side): No submental, no submandibular, no tonsillar, no preauricular, no posterior auricular and no occipital adenopathy present.    She has no cervical adenopathy.       Right cervical: No superficial cervical adenopathy present.       Left cervical: No superficial cervical adenopathy present.       Right axillary: No pectoral and no lateral adenopathy present.       Left axillary: No pectoral and no lateral adenopathy present. Neurological: She is alert and oriented to person, place, and time.  Skin: Skin is warm and dry.  Psychiatric: She has a normal mood and affect. Her behavior is normal.       Assessment & Plan:  Left supraclavicular mass - unclear etiology area the tissue is soft very similar to a lipoma which is definitely on the differential. Cousin to supraclavicular also want to work this up  further with imaging. Will need to find out if we can get CT of neck or if we will also need CT of chest to best identify the area. We'll also check a CBC to rule out lymphoma as well as infection and check a thyroid level since it is close to the thyroid gland.

## 2015-06-18 LAB — COMPLETE METABOLIC PANEL WITH GFR
ALBUMIN: 3.7 g/dL (ref 3.6–5.1)
ALK PHOS: 116 U/L (ref 33–130)
ALT: 17 U/L (ref 6–29)
AST: 18 U/L (ref 10–35)
BILIRUBIN TOTAL: 0.3 mg/dL (ref 0.2–1.2)
BUN: 13 mg/dL (ref 7–25)
CALCIUM: 8.9 mg/dL (ref 8.6–10.4)
CO2: 25 mmol/L (ref 20–31)
Chloride: 101 mmol/L (ref 98–110)
Creat: 0.82 mg/dL (ref 0.60–0.93)
GFR, EST NON AFRICAN AMERICAN: 72 mL/min (ref >=60)
GFR, Est African American: 83 mL/min (ref >=60)
GLUCOSE: 170 mg/dL — AB (ref 65–99)
Potassium: 4.2 mmol/L (ref 3.5–5.3)
SODIUM: 138 mmol/L (ref 135–146)
TOTAL PROTEIN: 6.7 g/dL (ref 6.1–8.1)

## 2015-06-18 LAB — CBC WITH DIFFERENTIAL/PLATELET
BASOS PCT: 1 % (ref 0–1)
Basophils Absolute: 0.1 10*3/uL (ref 0.0–0.1)
Eosinophils Absolute: 0.3 10*3/uL (ref 0.0–0.7)
Eosinophils Relative: 4 % (ref 0–5)
HEMATOCRIT: 39.9 % (ref 36.0–46.0)
HEMOGLOBIN: 12.7 g/dL (ref 12.0–15.0)
LYMPHS ABS: 1.5 10*3/uL (ref 0.7–4.0)
LYMPHS PCT: 18 % (ref 12–46)
MCH: 25.8 pg — AB (ref 26.0–34.0)
MCHC: 31.8 g/dL (ref 30.0–36.0)
MCV: 80.9 fL (ref 78.0–100.0)
MONO ABS: 0.7 10*3/uL (ref 0.1–1.0)
MONOS PCT: 8 % (ref 3–12)
MPV: 10.6 fL (ref 8.6–12.4)
NEUTROS ABS: 5.9 10*3/uL (ref 1.7–7.7)
NEUTROS PCT: 69 % (ref 43–77)
Platelets: 270 10*3/uL (ref 150–400)
RBC: 4.93 MIL/uL (ref 3.87–5.11)
RDW: 15 % (ref 11.5–15.5)
WBC: 8.6 10*3/uL (ref 4.0–10.5)

## 2015-06-18 LAB — TSH: TSH: 0.62 m[IU]/L

## 2015-06-18 LAB — C-REACTIVE PROTEIN: CRP: 3.1 mg/dL — ABNORMAL HIGH (ref <0.60)

## 2015-06-18 LAB — SEDIMENTATION RATE: Sed Rate: 38 mm/hr — ABNORMAL HIGH (ref 0–30)

## 2015-06-21 ENCOUNTER — Encounter: Payer: Self-pay | Admitting: Family Medicine

## 2015-06-21 ENCOUNTER — Ambulatory Visit (HOSPITAL_BASED_OUTPATIENT_CLINIC_OR_DEPARTMENT_OTHER)
Admission: RE | Admit: 2015-06-21 | Discharge: 2015-06-21 | Disposition: A | Discharge status: Home / Self Care | Payer: Medicare Other | Source: Ambulatory Visit | Attending: Family Medicine | Admitting: Family Medicine

## 2015-06-21 ENCOUNTER — Ambulatory Visit (INDEPENDENT_AMBULATORY_CARE_PROVIDER_SITE_OTHER): Payer: Medicare Other | Admitting: Family Medicine

## 2015-06-21 VITALS — BP 127/76 | HR 83 | Wt 255.0 lb

## 2015-06-21 DIAGNOSIS — R221 Localized swelling, mass and lump, neck: Secondary | ICD-10-CM | POA: Diagnosis not present

## 2015-06-21 DIAGNOSIS — F418 Other specified anxiety disorders: Secondary | ICD-10-CM

## 2015-06-21 DIAGNOSIS — E785 Hyperlipidemia, unspecified: Secondary | ICD-10-CM | POA: Diagnosis not present

## 2015-06-21 DIAGNOSIS — R42 Dizziness and giddiness: Secondary | ICD-10-CM | POA: Diagnosis not present

## 2015-06-21 DIAGNOSIS — E042 Nontoxic multinodular goiter: Secondary | ICD-10-CM | POA: Diagnosis not present

## 2015-06-21 DIAGNOSIS — R222 Localized swelling, mass and lump, trunk: Secondary | ICD-10-CM

## 2015-06-21 DIAGNOSIS — R350 Frequency of micturition: Secondary | ICD-10-CM | POA: Diagnosis not present

## 2015-06-21 DIAGNOSIS — F419 Anxiety disorder, unspecified: Principal | ICD-10-CM

## 2015-06-21 DIAGNOSIS — F32A Depression, unspecified: Secondary | ICD-10-CM

## 2015-06-21 DIAGNOSIS — F329 Major depressive disorder, single episode, unspecified: Secondary | ICD-10-CM

## 2015-06-21 MED ORDER — NITROFURANTOIN MONOHYD MACRO 100 MG PO CAPS: 100.0000 mg | ORAL_CAPSULE | Freq: Two times a day (BID) | ORAL | Status: AC

## 2015-06-21 MED ORDER — ATORVASTATIN CALCIUM 20 MG PO TABS: ORAL_TABLET | ORAL | Status: DC

## 2015-06-21 MED ORDER — IOHEXOL 300 MG/ML  SOLN
75.0000 mL | Freq: Once | INTRAMUSCULAR | Status: AC | PRN
  Administered 2015-06-21: 75 mL via INTRAVENOUS

## 2015-06-21 MED ORDER — ALPRAZOLAM 1 MG PO TABS: 1.0000 mg | ORAL_TABLET | Freq: Two times a day (BID) | ORAL | Status: DC | PRN

## 2015-06-21 NOTE — Progress Notes (Signed)
CC: Sarah Wright is a 70 y.o. female is here for Medication Refill and Urinary Frequency   Subjective: HPI:   urinary urgency  And bladder spasms for the past 2-3 days. Symptoms are worsening on a daily basis. Currently moderate in severity. Nothing seems to make  thembetter or worse. She is waking 3 times a night to urinate which is out of character for her. Denies fevers, chills, abdominal pain or pelvic pain other than described above.   requesting refills on alprazolam. She is taking is 1-2 times a day for anxiety. She denies any  Known side effects or sedation.  With her current regimen she denies anxiety that interferes with quality of life.   she is requesting a refill on  Atorvastatin. No right upper quadrant pain or myalgias. No chest pain or limb claudication.  Review Of Systems Outlined In HPI  Past Medical History  Diagnosis Date  . Hyperlipidemia   . Thyroid disease     Past Surgical History  Procedure Laterality Date  . Thyroidectomy, partial      right side removed  . Abdominal hysterectomy    . Cervical fusion    . Carpal tunnel release      both wrists  . Gallbladder surgery    . Right foot surgery    . Cataract extraction, bilateral     Family History  Problem Relation Age of Onset  . Uterine cancer    . Pancreatic cancer    . Heart attack Father   . Depression Mother   . Diabetes Father   . Hypertension      parents    Social History   Social History  . Marital Status: Divorced    Spouse Name: N/A  . Number of Children: N/A  . Years of Education: N/A   Occupational History  . Not on file.   Social History Main Topics  . Smoking status: Former Smoker    Quit date: 02/12/2014  . Smokeless tobacco: Not on file  . Alcohol Use: No  . Drug Use: No  . Sexual Activity: Not Currently   Other Topics Concern  . Not on file   Social History Narrative     Objective: BP 127/76 mmHg  Pulse 83  Wt 255 lb (115.667 kg) Vital signs  reviewed. General: Alert and Oriented, No Acute Distress HEENT: Pupils equal, round, reactive to light. Conjunctivae clear.  External ears unremarkable.  Moist mucous membranes. Lungs: Clear and comfortable work of breathing, speaking in full sentences without accessory muscle use. Cardiac: Regular rate and rhythm.  Neuro: CN II-XII grossly intact, gait normal. Extremities: No peripheral edema.  Strong peripheral pulses.  Mental Status: No depression, anxiety, nor agitation. Logical though process. Skin: Warm and dry. Assessment & Plan: Sarah Wright was seen today for medication refill and urinary frequency.  Diagnoses and all orders for this visit:  Anxiety and depression -     ALPRAZolam (XANAX) 1 MG tablet; Take 1 tablet (1 mg total) by mouth 2 (two) times daily as needed for anxiety.  Hyperlipidemia -     atorvastatin (LIPITOR) 20 MG tablet; TAKE 1 TABLET (20 MG TOTAL) BY MOUTH DAILY.  Urinary frequency -     nitrofurantoin, macrocrystal-monohydrate, (MACROBID) 100 MG capsule; Take 1 capsule (100 mg total) by mouth 2 (two) times daily. -     Urine culture   Anxiety: Controlled with alprazolam, refills provided Hyperlipidemia: Controlled with atorvastatin due for repeat lipid panel in June Urinary frequency: She was unable  to provide a urine sample today, start Macrobid if no better after 2 days return for urine culture and urinalysis.  25 minutes spent face-to-face during visit today of which at least 50% was counseling or coordinating care regarding: 1. Anxiety and depression   2. Hyperlipidemia   3. Urinary frequency      Return in about 2 months (around 08/19/2015) for sugar.

## 2015-06-22 ENCOUNTER — Telehealth: Payer: Self-pay | Admitting: Family Medicine

## 2015-06-22 DIAGNOSIS — E041 Nontoxic single thyroid nodule: Secondary | ICD-10-CM | POA: Insufficient documentation

## 2015-06-22 DIAGNOSIS — L603 Nail dystrophy: Secondary | ICD-10-CM | POA: Diagnosis not present

## 2015-06-22 DIAGNOSIS — L84 Corns and callosities: Secondary | ICD-10-CM | POA: Diagnosis not present

## 2015-06-22 DIAGNOSIS — I739 Peripheral vascular disease, unspecified: Secondary | ICD-10-CM | POA: Diagnosis not present

## 2015-06-22 DIAGNOSIS — E1151 Type 2 diabetes mellitus with diabetic peripheral angiopathy without gangrene: Secondary | ICD-10-CM | POA: Diagnosis not present

## 2015-06-22 NOTE — Telephone Encounter (Signed)
Will you please let patient know that her carotid arteries do not have any significant cholesterol or narrowing.  The remaining part of her thyroid showed some nodules and the radiologist has recommend another u/s but this time dedicated only to the thyroid. i'll place an order for this.

## 2015-06-22 NOTE — Telephone Encounter (Signed)
Awaiting call back.

## 2015-06-23 ENCOUNTER — Other Ambulatory Visit: Payer: Self-pay | Admitting: Sports Medicine

## 2015-06-23 NOTE — Telephone Encounter (Signed)
Results left on vm pt advised to call back with any questions

## 2015-06-29 ENCOUNTER — Other Ambulatory Visit: Payer: Self-pay | Admitting: Family Medicine

## 2015-07-05 ENCOUNTER — Other Ambulatory Visit: Payer: Self-pay

## 2015-07-05 DIAGNOSIS — F329 Major depressive disorder, single episode, unspecified: Secondary | ICD-10-CM

## 2015-07-05 DIAGNOSIS — E785 Hyperlipidemia, unspecified: Secondary | ICD-10-CM

## 2015-07-05 DIAGNOSIS — F419 Anxiety disorder, unspecified: Secondary | ICD-10-CM

## 2015-07-05 DIAGNOSIS — F32A Depression, unspecified: Secondary | ICD-10-CM

## 2015-07-05 MED ORDER — BUPROPION HCL ER (XL) 150 MG PO TB24: 150.0000 mg | ORAL_TABLET | Freq: Every morning | ORAL | Status: DC

## 2015-07-05 MED ORDER — MONTELUKAST SODIUM 10 MG PO TABS: 10.0000 mg | ORAL_TABLET | Freq: Every day | ORAL | Status: DC

## 2015-07-15 DIAGNOSIS — Z79891 Long term (current) use of opiate analgesic: Secondary | ICD-10-CM | POA: Diagnosis not present

## 2015-07-15 DIAGNOSIS — Z79899 Other long term (current) drug therapy: Secondary | ICD-10-CM | POA: Diagnosis not present

## 2015-07-15 DIAGNOSIS — G8929 Other chronic pain: Secondary | ICD-10-CM | POA: Diagnosis not present

## 2015-07-15 DIAGNOSIS — M47816 Spondylosis without myelopathy or radiculopathy, lumbar region: Secondary | ICD-10-CM | POA: Diagnosis not present

## 2015-07-16 ENCOUNTER — Telehealth: Payer: Self-pay | Admitting: Family Medicine

## 2015-07-16 ENCOUNTER — Ambulatory Visit (INDEPENDENT_AMBULATORY_CARE_PROVIDER_SITE_OTHER): Payer: Medicare Other

## 2015-07-16 DIAGNOSIS — E042 Nontoxic multinodular goiter: Secondary | ICD-10-CM

## 2015-07-16 DIAGNOSIS — E041 Nontoxic single thyroid nodule: Secondary | ICD-10-CM

## 2015-07-16 NOTE — Telephone Encounter (Signed)
Will you please let patient know that her thyroid ultrasound confirmed multiple nodules on her remaining thyroid tissue.  The largest one was 2cm round and needs to be biopsied to determine if it needs to be surgically removed.  I've placed an order with interventional radiology to have this done as an outpatient, please let me know if not contacted about this next week.

## 2015-07-16 NOTE — Telephone Encounter (Signed)
Pt.notified

## 2015-07-20 ENCOUNTER — Other Ambulatory Visit: Payer: Self-pay | Admitting: Sports Medicine

## 2015-07-21 DIAGNOSIS — M25561 Pain in right knee: Secondary | ICD-10-CM | POA: Diagnosis not present

## 2015-07-21 DIAGNOSIS — M25562 Pain in left knee: Secondary | ICD-10-CM | POA: Diagnosis not present

## 2015-07-26 ENCOUNTER — Other Ambulatory Visit: Payer: Self-pay | Admitting: Osteopathic Medicine

## 2015-07-26 DIAGNOSIS — R2689 Other abnormalities of gait and mobility: Secondary | ICD-10-CM | POA: Diagnosis not present

## 2015-07-26 DIAGNOSIS — H903 Sensorineural hearing loss, bilateral: Secondary | ICD-10-CM | POA: Diagnosis not present

## 2015-07-28 ENCOUNTER — Encounter: Payer: Self-pay | Admitting: Family Medicine

## 2015-07-28 ENCOUNTER — Ambulatory Visit (INDEPENDENT_AMBULATORY_CARE_PROVIDER_SITE_OTHER): Payer: Medicare Other | Admitting: Family Medicine

## 2015-07-28 VITALS — BP 97/63 | HR 75 | Wt 242.0 lb

## 2015-07-28 DIAGNOSIS — R32 Unspecified urinary incontinence: Secondary | ICD-10-CM | POA: Diagnosis not present

## 2015-07-28 DIAGNOSIS — R413 Other amnesia: Secondary | ICD-10-CM

## 2015-07-28 DIAGNOSIS — E119 Type 2 diabetes mellitus without complications: Secondary | ICD-10-CM

## 2015-07-28 LAB — COMPLETE METABOLIC PANEL WITH GFR
ALBUMIN: 4 g/dL (ref 3.6–5.1)
ALT: 27 U/L (ref 6–29)
AST: 17 U/L (ref 10–35)
Alkaline Phosphatase: 117 U/L (ref 33–130)
BILIRUBIN TOTAL: 0.4 mg/dL (ref 0.2–1.2)
BUN: 29 mg/dL — AB (ref 7–25)
CHLORIDE: 105 mmol/L (ref 98–110)
CO2: 23 mmol/L (ref 20–31)
CREATININE: 0.96 mg/dL — AB (ref 0.60–0.93)
Calcium: 9.2 mg/dL (ref 8.6–10.4)
GFR, EST AFRICAN AMERICAN: 69 mL/min (ref >=60)
GFR, EST NON AFRICAN AMERICAN: 60 mL/min (ref >=60)
GLUCOSE: 97 mg/dL (ref 65–99)
POTASSIUM: 4.1 mmol/L (ref 3.5–5.3)
Sodium: 140 mmol/L (ref 135–146)
TOTAL PROTEIN: 6.8 g/dL (ref 6.1–8.1)

## 2015-07-28 LAB — HEMOGLOBIN A1C
Hgb A1c MFr Bld: 7.4 % — ABNORMAL HIGH (ref <5.7)
Mean Plasma Glucose: 166 mg/dL

## 2015-07-28 NOTE — Progress Notes (Signed)
CC: Sarah Wright is a 70 y.o. female is here for Memory Loss; Joint Swelling; and shaking/wobbly   Subjective: HPI:  Difficulty with short-term memory over the past 2-3 months that has been noticed by the patient and family members. She reports that it's only short-term memory. For example she'll forget conversations that they had the day before. She'll forget that she watches TV show. She'll forget about directions on how to get local stores. She denies getting lost or any recent head trauma. Occasional misplaced items at home however the family cannot think of any particular examples. No family history of dementia. No changes to over-the-counter medications. Symptoms are present on a daily basis to a mild degree. Nothing seems to make it better or worse. She denies any falls since I saw her last or close calls.  Twice now since I saw her last she had urinary incontinence where she emptied her bladder completely while sleeping. She denies any issues with incontinence while awake. She denies any dysuria or urinary urgency.  Follow-up type 2 diabetes: She is having trouble operating her glucometer. Her daughter is also having difficulty with getting the unit to work. She has not outside blood sugars reports I saw her last.  She has a mass on her right wrist that's been there for almost a decade. Slowly getting larger but never has hurt.     Review Of Systems Outlined In HPI  Past Medical History  Diagnosis Date  . Hyperlipidemia   . Thyroid disease     Past Surgical History  Procedure Laterality Date  . Thyroidectomy, partial      right side removed  . Abdominal hysterectomy    . Cervical fusion    . Carpal tunnel release      both wrists  . Gallbladder surgery    . Right foot surgery    . Cataract extraction, bilateral     Family History  Problem Relation Age of Onset  . Uterine cancer    . Pancreatic cancer    . Heart attack Father   . Depression Mother   . Diabetes Father    . Hypertension      parents    Social History   Social History  . Marital Status: Divorced    Spouse Name: N/A  . Number of Children: N/A  . Years of Education: N/A   Occupational History  . Not on file.   Social History Main Topics  . Smoking status: Former Smoker    Quit date: 02/12/2014  . Smokeless tobacco: Not on file  . Alcohol Use: No  . Drug Use: No  . Sexual Activity: Not Currently   Other Topics Concern  . Not on file   Social History Narrative     Objective: BP 97/63 mmHg  Pulse 75  Wt 242 lb (109.77 kg)  Vital signs reviewed. General: Alert and Oriented, No Acute Distress HEENT: Pupils equal, round, reactive to light. Conjunctivae clear.  External ears unremarkable.  Moist mucous membranes. Lungs: Clear and comfortable work of breathing, speaking in full sentences without accessory muscle use. Cardiac: Regular rate and rhythm.  Neuro: CN II-XII grossly intact, gait normal. Extremities: No peripheral edema.  Strong peripheral pulses. No upper extremity cogwheeling or rigidity. High amplitude index finger to thumb rapid and fluid bilaterally. Mental Status: No depression, anxiety, nor agitation. Logical though process. Skin: Warm and dry. Painless ganglion cyst on the radial surface of the right wrist  Assessment & Plan: Sarah Wright was seen today  for memory loss, joint swelling and shaking/wobbly.  Diagnoses and all orders for this visit:  Type 2 diabetes mellitus without complication, without long-term current use of insulin (HCC) -     Hemoglobin A1c  Memory loss -     Vitamin B12 -     RPR -     COMPLETE METABOLIC PANEL WITH GFR  Urinary incontinence, unspecified incontinence type -     Urinalysis, Routine w reflex microscopic -     Urine culture   Type 2 diabetes: Time was taken to show her how to use her glucometer. I was able to continue working after running a control sample on one of the unused test strips. She was then shown how to do this  both by myself and with my assistant present. Memory loss: Rule out B12 deficiency metabolic abnormality or neurosyphilis. If unremarkable labs will try Aricept. Urinary incontinence: Rule out UTI, will keep normal pressure hydrocephalus in the differential of her memory loss.  40 minutes spent face-to-face during visit today of which at least 50% was counseling or coordinating care regarding: 1. Type 2 diabetes mellitus without complication, without long-term current use of insulin ([facility])   2. Memory loss   3. Urinary incontinence, unspecified incontinence type      Return in about 4 weeks (around 08/25/2015).

## 2015-07-29 ENCOUNTER — Telehealth: Payer: Self-pay | Admitting: Family Medicine

## 2015-07-29 DIAGNOSIS — R413 Other amnesia: Secondary | ICD-10-CM

## 2015-07-29 LAB — URINALYSIS, MICROSCOPIC ONLY
CASTS: NONE SEEN [LPF]
CRYSTALS: NONE SEEN [HPF]
RBC / HPF: NONE SEEN RBC/HPF (ref <=2)
Yeast: NONE SEEN [HPF]

## 2015-07-29 LAB — URINALYSIS, ROUTINE W REFLEX MICROSCOPIC
Bilirubin Urine: NEGATIVE
GLUCOSE, UA: NEGATIVE
HGB URINE DIPSTICK: NEGATIVE
KETONES UR: NEGATIVE
Nitrite: NEGATIVE
PH: 5 (ref 5.0–8.0)
Protein, ur: NEGATIVE
SPECIFIC GRAVITY, URINE: 1.015 (ref 1.001–1.035)

## 2015-07-29 LAB — VITAMIN B12: Vitamin B-12: 299 pg/mL (ref 200–1100)

## 2015-07-29 LAB — RPR

## 2015-07-29 MED ORDER — DONEPEZIL HCL 10 MG PO TABS: 10.0000 mg | ORAL_TABLET | Freq: Every day | ORAL | Status: DC

## 2015-07-29 MED ORDER — DONEPEZIL HCL 10 MG PO TABS: 5.0000 mg | ORAL_TABLET | Freq: Every day | ORAL | Status: DC

## 2015-07-29 NOTE — Telephone Encounter (Signed)
Pt.notified

## 2015-07-29 NOTE — Telephone Encounter (Signed)
Will you please let Sarah Wright know that her labs and urine studies were reassuring and showed no abnormality, her A1c was 7.4 which is acceptable.  I'm curious to see if her topamax is causing some of her memory loss, memory problems are somewhat common with this medication so I'd recommend she stop it for a week and see if anything improves. If there is no improvement she should restart it and pick up a new Rx called aricept that I've [facility] [place]. Please schedule f/u appt in one month.

## 2015-07-30 LAB — URINE CULTURE: Colony Count: >=100000

## 2015-08-01 ENCOUNTER — Other Ambulatory Visit: Payer: Self-pay | Admitting: Family Medicine

## 2015-08-02 ENCOUNTER — Telehealth: Payer: Self-pay | Admitting: Family Medicine

## 2015-08-02 ENCOUNTER — Other Ambulatory Visit: Payer: Self-pay | Admitting: Sports Medicine

## 2015-08-02 MED ORDER — CIPROFLOXACIN HCL 250 MG PO TABS: ORAL_TABLET | ORAL | Status: DC

## 2015-08-02 NOTE — Telephone Encounter (Signed)
Will you please let patient know that her urine culture actually grew out an e.coli infection which could explain why she wet the bed the other day, I've sent in a Rx of cipro to help treat this.

## 2015-08-02 NOTE — Telephone Encounter (Signed)
Awaiting call back.

## 2015-08-03 ENCOUNTER — Other Ambulatory Visit: Payer: Self-pay

## 2015-08-03 MED ORDER — CIPROFLOXACIN HCL 250 MG PO TABS: ORAL_TABLET | ORAL | Status: AC

## 2015-08-03 NOTE — Telephone Encounter (Signed)
Pt notified.  Pt cipro sent to the correct pharmacy.

## 2015-08-10 ENCOUNTER — Other Ambulatory Visit: Payer: Self-pay | Admitting: Family Medicine

## 2015-08-16 DIAGNOSIS — F329 Major depressive disorder, single episode, unspecified: Secondary | ICD-10-CM | POA: Diagnosis not present

## 2015-08-16 DIAGNOSIS — Z79891 Long term (current) use of opiate analgesic: Secondary | ICD-10-CM | POA: Diagnosis not present

## 2015-08-16 DIAGNOSIS — M5136 Other intervertebral disc degeneration, lumbar region: Secondary | ICD-10-CM | POA: Diagnosis not present

## 2015-08-16 DIAGNOSIS — M47816 Spondylosis without myelopathy or radiculopathy, lumbar region: Secondary | ICD-10-CM | POA: Diagnosis not present

## 2015-08-16 DIAGNOSIS — G8929 Other chronic pain: Secondary | ICD-10-CM | POA: Diagnosis not present

## 2015-08-22 ENCOUNTER — Other Ambulatory Visit: Payer: Self-pay | Admitting: Family Medicine

## 2015-08-22 ENCOUNTER — Encounter: Payer: Self-pay | Admitting: Emergency Medicine

## 2015-08-22 ENCOUNTER — Emergency Department (INDEPENDENT_AMBULATORY_CARE_PROVIDER_SITE_OTHER)
Admission: EM | Admit: 2015-08-22 | Discharge: 2015-08-22 | Disposition: A | Discharge status: Home / Self Care | Payer: Medicare Other | Source: Home / Self Care | Attending: Family Medicine | Admitting: Family Medicine

## 2015-08-22 DIAGNOSIS — B9789 Other viral agents as the cause of diseases classified elsewhere: Principal | ICD-10-CM

## 2015-08-22 DIAGNOSIS — J441 Chronic obstructive pulmonary disease with (acute) exacerbation: Secondary | ICD-10-CM | POA: Diagnosis not present

## 2015-08-22 DIAGNOSIS — J069 Acute upper respiratory infection, unspecified: Secondary | ICD-10-CM

## 2015-08-22 HISTORY — DX: Type 2 diabetes mellitus without complications: E11.9

## 2015-08-22 MED ORDER — PREDNISONE 20 MG PO TABS: ORAL_TABLET | ORAL | Status: DC

## 2015-08-22 MED ORDER — DOXYCYCLINE HYCLATE 100 MG PO CAPS: 100.0000 mg | ORAL_CAPSULE | Freq: Two times a day (BID) | ORAL | Status: DC

## 2015-08-22 MED ORDER — BENZONATATE 200 MG PO CAPS: 200.0000 mg | ORAL_CAPSULE | Freq: Every day | ORAL | Status: DC

## 2015-08-22 NOTE — ED Provider Notes (Signed)
CSN: [id number]     Arrival date & time 08/22/15  1109 History   First MD Initiated Contact with Patient 08/22/15 1203     Chief Complaint  Patient presents with  . Cough      HPI Comments: Yesterday patient developed typical cold-like symptoms, including mild sore throat, sinus congestion, headache, fatigue, and cough.  She has a history of COPD and asthma, with a past history of frequent bronchitis.  She had pneumonia about two years ago.    The history is provided by the patient.    Past Medical History  Diagnosis Date  . Hyperlipidemia   . Thyroid disease   . Diabetes mellitus without complication [facility])    Past Surgical History  Procedure Laterality Date  . Thyroidectomy, partial      right side removed  . Abdominal hysterectomy    . Cervical fusion    . Carpal tunnel release      both wrists  . Gallbladder surgery    . Right foot surgery    . Cataract extraction, bilateral     Family History  Problem Relation Age of Onset  . Uterine cancer    . Pancreatic cancer    . Heart attack Father   . Depression Mother   . Diabetes Father   . Hypertension      parents   Social History  Substance Use Topics  . Smoking status: Former Smoker    Quit date: 02/12/2014  . Smokeless tobacco: None  . Alcohol Use: No   OB History    No data available     Review of Systems + sore throat + cough No pleuritic pain + wheezing + nasal congestion + post-nasal drainage No sinus pain/pressure No itchy/red eyes No earache No hemoptysis No SOB No fever, + chills No nausea No vomiting No abdominal pain No diarrhea No urinary symptoms No skin rash + fatigue No myalgias + headache Used OTC meds without relief  Allergies  Sulfa antibiotics  Home Medications   Prior to Admission medications   Medication Sig Start Date End Date Taking? Authorizing Provider  ALPRAZolam Duanne Moron) 1 MG tablet Take 1 tablet (1 mg total) by mouth 2 (two) times daily as needed for anxiety.  06/21/15   Sean Hommel, DO  AMBULATORY NON FORMULARY MEDICATION Knee-high, medium compression, graduated compression stockings. Apply to lower extremities. [email or web address], Zippered Compression Stockings, medium circ, long length 08/26/14   Silverio Decamp, MD  aspirin 81 MG tablet Take 81 mg by mouth daily.    Historical Provider, MD  atorvastatin (LIPITOR) 20 MG tablet TAKE 1 TABLET (20 MG TOTAL) BY MOUTH DAILY. 06/21/15   Marcial Pacas, DO  BD PEN NEEDLE NANO U/F 32G X 4 MM MISC USE WITH VICTOZA ONCE DAILY 12/22/14   Silverio Decamp, MD  benzonatate (TESSALON) 200 MG capsule Take 1 capsule (200 mg total) by mouth at bedtime. Take as needed for cough 08/22/15   Kandra Nicolas, MD  budesonide-formoterol [facility]) 160-4.5 MCG/ACT inhaler Inhale 2 puffs into the lungs 2 (two) times daily. 10/23/14   Sean Hommel, DO  buPROPion (WELLBUTRIN XL) 150 MG 24 hr tablet Take 1 tablet (150 mg total) by mouth every morning. 07/05/15   Sean Hommel, DO  citalopram (CELEXA) 40 MG tablet TAKE ONE TABLET BY MOUTH ONE TIME DAILY 03/08/15   Sean Hommel, DO  donepezil (ARICEPT) 10 MG tablet Take 0.5 tablets (5 mg total) by mouth at bedtime. To help preserve memory. 07/29/15  Marcial Pacas, DO  doxycycline (VIBRAMYCIN) 100 MG capsule Take 1 capsule (100 mg total) by mouth 2 (two) times daily. Take with food. 08/22/15   Kandra Nicolas, MD  furosemide (LASIX) 20 MG tablet TAKE ONE TABLET BY MOUTH DAILY AS NEEDED FOR ANKLE SWELLING 08/10/15   Sean Hommel, DO  gabapentin (NEURONTIN) 300 MG capsule TAKE 1 CAP AT BEDTIME X1 WK,TWICE DAILY X1 WK, THEN 3 TIMES DAILY. MAY DOUBLE WEEKLY TO 3600MG /DAY 08/02/15   Silverio Decamp, MD  HYDROcodone-acetaminophen (NORCO) 7.5-325 MG tablet Take 7.5-325 tablets by mouth every 8 (eight) hours as needed. 06/17/15   Historical Provider, MD  montelukast (SINGULAIR) 10 MG tablet Take 1 tablet (10 mg total) by mouth at bedtime. 07/05/15   Sean Hommel, DO  OLANZapine (ZYPREXA) 5 MG  tablet TAKE 1 TABLET BY MOUTH AT BEDTIME 06/30/15   Sean Hommel, DO  predniSONE (DELTASONE) 20 MG tablet Take one tab by mouth twice daily for 5 days, then one daily for 3 days. Take with food. 08/22/15   Kandra Nicolas, MD  SPIRIVA HANDIHALER 18 MCG inhalation capsule INHALE ONE CAPSULE VIA HANDIHALER ONE TIME DAILY 02/05/15   Gregor Hams, MD  temazepam (RESTORIL) 15 MG capsule TAKE ONE CAPSULE BY MOUTH NIGHTLY AT BEDTIME AS NEEDED FOR SLEEP 05/28/15   Silverio Decamp, MD  topiramate (TOPAMAX) 100 MG tablet TAKE 1 TABLET BY MOUTH EVERY DAY 07/21/15   Silverio Decamp, MD  VICTOZA 18 MG/3ML SOPN INJECT 0.6 MG SUBCUTANEOUSLY ONCE DAILY, INCREASING BY 0.6 MG WEEKLY UNTIL 1.8 MG DAILY 08/02/15   Marcial Pacas, DO   Meds Ordered and Administered this Visit  Medications - No data to display  BP 116/70 mmHg  Pulse 83  Temp(Src) 98.5 F (36.9 C) (Oral)  Ht 5\' 5"  (1.651 m)  Wt 245 lb (111.131 kg)  BMI 40.77 kg/m2  SpO2 92% No data found.   Physical Exam Nursing notes and Vital Signs reviewed. Appearance:  Patient appears stated age, and in no acute distress.  Patient is obese (BMI 40.8) Eyes:  Pupils are equal, round, and reactive to light and accomodation.  Extraocular movement is intact.  Conjunctivae are not inflamed  Ears:  Canals normal.  Tympanic membranes normal.  Nose:  Congested turbinates.  No sinus tenderness.   Pharynx:  Normal Neck:  Supple.  Tender enlarged posterior/lateral nodes are palpated bilaterally  Lungs:   Few faint expiratory wheezes heard anteriorly.  Breath sounds are equal.  Moving air well. Chest:  Distinct tenderness to palpation over the mid-sternum.  Heart:  Regular rate and rhythm without murmurs, rubs, or gallops.  Abdomen:  Nontender without masses or hepatosplenomegaly.  Bowel sounds are present.  No CVA or flank tenderness.  Extremities:  No edema.  Skin:  No rash present.   ED Course  Procedures None   MDM   1. Viral URI with cough   2. COPD  exacerbation (HCC)    Begin prednisone burst/taper.  Prescription written for Benzonatate [facility]) to take at bedtime for night-time cough.  Begin doxycycline for atypical coverage. Take plain guaifenesin (1200mg  extended release tabs such as Mucinex) twice daily, with plenty of water, for cough and congestion.  May add Pseudoephedrine (30mg , one or two every 4 to 6 hours) for sinus congestion.  Get adequate rest.   May use Afrin nasal spray (or generic oxymetazoline) twice daily for about 5 days and then discontinue.  Also recommend using saline nasal spray several times daily and saline  nasal irrigation (AYR is a common brand).  Use Flonase nasal spray each morning after using Afrin nasal spray and saline nasal irrigation. Try warm salt water gargles for sore throat.  Stop all antihistamines for now, and other non-prescription cough/cold preparations. May continue Singulair.  Continue all inhalers as prescribed. Follow-up with family doctor if not improving about10 days.     Kandra Nicolas, MD 08/31/15 [phone]

## 2015-08-22 NOTE — Discharge Instructions (Signed)
Take plain guaifenesin (1200mg  extended release tabs such as Mucinex) twice daily, with plenty of water, for cough and congestion.  May add Pseudoephedrine (30mg , one or two every 4 to 6 hours) for sinus congestion.  Get adequate rest.   May use Afrin nasal spray (or generic oxymetazoline) twice daily for about 5 days and then discontinue.  Also recommend using saline nasal spray several times daily and saline nasal irrigation (AYR is a common brand).  Use Flonase nasal spray each morning after using Afrin nasal spray and saline nasal irrigation. Try warm salt water gargles for sore throat.  Stop all antihistamines for now, and other non-prescription cough/cold preparations. May continue Singulair.  Continue all inhalers as prescribed. Follow-up with family doctor if not improving about10 days.

## 2015-08-22 NOTE — ED Notes (Signed)
Pt c/o head and chest congestion x 1 day, productive cough, hx of bronchitis and pneumonia

## 2015-08-24 ENCOUNTER — Telehealth: Payer: Self-pay | Admitting: *Deleted

## 2015-08-26 DIAGNOSIS — Z6841 Body Mass Index (BMI) 40.0 and over, adult: Secondary | ICD-10-CM | POA: Diagnosis not present

## 2015-08-27 ENCOUNTER — Ambulatory Visit (INDEPENDENT_AMBULATORY_CARE_PROVIDER_SITE_OTHER): Payer: Medicare Other

## 2015-08-27 ENCOUNTER — Encounter: Payer: Self-pay | Admitting: Family Medicine

## 2015-08-27 ENCOUNTER — Ambulatory Visit (INDEPENDENT_AMBULATORY_CARE_PROVIDER_SITE_OTHER): Payer: Medicare Other | Admitting: Family Medicine

## 2015-08-27 VITALS — BP 127/71 | HR 84 | Wt 246.0 lb

## 2015-08-27 DIAGNOSIS — F418 Other specified anxiety disorders: Secondary | ICD-10-CM | POA: Diagnosis not present

## 2015-08-27 DIAGNOSIS — R413 Other amnesia: Secondary | ICD-10-CM | POA: Diagnosis not present

## 2015-08-27 DIAGNOSIS — R05 Cough: Secondary | ICD-10-CM

## 2015-08-27 DIAGNOSIS — I517 Cardiomegaly: Secondary | ICD-10-CM | POA: Diagnosis not present

## 2015-08-27 DIAGNOSIS — F32A Depression, unspecified: Secondary | ICD-10-CM

## 2015-08-27 DIAGNOSIS — N3944 Nocturnal enuresis: Secondary | ICD-10-CM | POA: Diagnosis not present

## 2015-08-27 DIAGNOSIS — R059 Cough, unspecified: Secondary | ICD-10-CM

## 2015-08-27 DIAGNOSIS — F329 Major depressive disorder, single episode, unspecified: Secondary | ICD-10-CM

## 2015-08-27 DIAGNOSIS — F419 Anxiety disorder, unspecified: Secondary | ICD-10-CM

## 2015-08-27 MED ORDER — BUPROPION HCL ER (XL) 150 MG PO TB24: 300.0000 mg | ORAL_TABLET | Freq: Every morning | ORAL | Status: DC

## 2015-08-27 MED ORDER — PREDNISONE 20 MG PO TABS: ORAL_TABLET | ORAL | Status: DC

## 2015-08-27 MED ORDER — PREDNISONE 20 MG PO TABS: ORAL_TABLET | ORAL | Status: AC

## 2015-08-27 NOTE — Progress Notes (Signed)
CC: Sarah Wright is a 70 y.o. female is here for Medication Management   Subjective: HPI:  Since I saw her last she has been treated for urinary tract infection and tells me that her memory loss has improved slightly. She denies any agreeable decline with her memory. It still only involving her short-term memory. She denies any other motor or sensory disturbances other than worsening depression. She tells me she dwells on things in the past that really shouldn't affect her now or in the future. She wants to know if there is some adjustments in her medications that might help reduce his tendency of her first. She denies buckling or others.  Since I saw her last and her urinary tract was treated she's no longer having any nocturnal enuresis. She denies any genitourinary complaints today.  Her major complaint is a continued cough that has not responded to doxycycline or prednisone. It's productive and accompanied by nasal congestion. She can feel herself rattling in her chest and also wheezing. Nothing seems to make it better or worse. No benefit from Symbicort. She is not sure about shortness of breath. She denies fevers but has had some chills lately   Review Of Systems Outlined In HPI  Past Medical History  Diagnosis Date  . Hyperlipidemia   . Thyroid disease   . Diabetes mellitus without complication [facility])     Past Surgical History  Procedure Laterality Date  . Thyroidectomy, partial      right side removed  . Abdominal hysterectomy    . Cervical fusion    . Carpal tunnel release      both wrists  . Gallbladder surgery    . Right foot surgery    . Cataract extraction, bilateral     Family History  Problem Relation Age of Onset  . Uterine cancer    . Pancreatic cancer    . Heart attack Father   . Depression Mother   . Diabetes Father   . Hypertension      parents    Social History   Social History  . Marital Status: Divorced    Spouse Name: N/A  . Number of Children:  N/A  . Years of Education: N/A   Occupational History  . Not on file.   Social History Main Topics  . Smoking status: Former Smoker    Quit date: 02/12/2014  . Smokeless tobacco: Not on file  . Alcohol Use: No  . Drug Use: No  . Sexual Activity: Not Currently   Other Topics Concern  . Not on file   Social History Narrative     Objective: BP 127/71 mmHg  Pulse 84  Wt 246 lb (111.585 kg)  General: Alert and Oriented, No Acute Distress HEENT: Pupils equal, round, reactive to light. Conjunctivae clear.  External ears unremarkable, canals clear with intact TMs with appropriate landmarks.  Middle ear appears open without effusion. Pink inferior turbinates.  Moist mucous membranes, pharynx without inflammation nor lesions.  Neck supple without palpable lymphadenopathy nor abnormal masses. Lungs: comfortable work of breathing, occasional coughing, no rales or wheezing however left posterior inferior lung field makes a gurgling sound on expiration. Cardiac: Regular rate and rhythm. Normal S1/S2.  No murmurs, rubs, nor gallops.   Extremities: No peripheral edema.  Strong peripheral pulses.  Mental Status: No depression, anxiety, nor agitation. Skin: Warm and dry.  Assessment & Plan: Sarah Wright was seen today for medication management.  Diagnoses and all orders for this visit:  Memory loss  Nocturnal enuresis  Cough -     DG Chest 2 View -     Discontinue: predniSONE (DELTASONE) 20 MG tablet; Take one tab by mouth twice daily for 5 days, then one daily for 3 days. Take with food. -     predniSONE (DELTASONE) 20 MG tablet; Three tabs daily days 1-3, two tabs daily days 4-6, one tab daily days 7-9, half tab daily days 10-13.  Anxiety and depression  Other orders -     buPROPion (WELLBUTRIN XL) 150 MG 24 hr tablet; Take 2 tablets (300 mg total) by mouth every morning.   Memory loss: Some improved,I've asked her to consider stopping Topamax to see if this helps improve even more.  She cannot remember what this was originally prescribed for Nocturnal enuresis resolved with  Eradication of UTI Cough: Chest x-ray was reassuring I think that gurgling sound I heard was coming from her stomach. Start prednisone at a much more aggressive taper. Depression: Uncontrolled chronic condition increasing Wellbutrin  Return if symptoms worsen or fail to improve.

## 2015-08-31 ENCOUNTER — Ambulatory Visit (INDEPENDENT_AMBULATORY_CARE_PROVIDER_SITE_OTHER): Payer: Medicare Other | Admitting: Family Medicine

## 2015-08-31 ENCOUNTER — Telehealth: Payer: Self-pay | Admitting: Family Medicine

## 2015-08-31 ENCOUNTER — Encounter: Payer: Self-pay | Admitting: Family Medicine

## 2015-08-31 VITALS — BP 126/69 | HR 81 | Wt 241.0 lb

## 2015-08-31 DIAGNOSIS — M47896 Other spondylosis, lumbar region: Secondary | ICD-10-CM | POA: Diagnosis not present

## 2015-08-31 DIAGNOSIS — R42 Dizziness and giddiness: Secondary | ICD-10-CM | POA: Diagnosis not present

## 2015-08-31 DIAGNOSIS — J449 Chronic obstructive pulmonary disease, unspecified: Secondary | ICD-10-CM | POA: Diagnosis not present

## 2015-08-31 DIAGNOSIS — M5136 Other intervertebral disc degeneration, lumbar region: Secondary | ICD-10-CM | POA: Diagnosis not present

## 2015-08-31 NOTE — Telephone Encounter (Signed)
We please let patient know that after doing some more research it's very unlikely that she'll be eligible for a power scooter that I showed her pictures of what we were together today. Instead it looks promising that she can get approved for a motorized wheelchair. I'm going to use a company called [employer] that had success with in the past. I'll let her know when I know any new information, we'll be faxing a order to that company in the next day or 2.

## 2015-08-31 NOTE — Progress Notes (Signed)
CC: Sarah Wright is a 70 y.o. female is here for Face to face mobility evaluation   Subjective: HPI:  Patient is wanting to know if she is eligible for coverage for a power wheelchair. She suffers from chronic back pain due to lumbar degenerative disc disease and osteoarthritis of the lumbar spine. Since I met her for the first time last year she's having worsening COPD symptoms which has impacted her stamina and comfort with breathing. Additionally over the last 2 years she's been experiencing unpredictable episodes of dizziness.  All of this together has complicated the safety of her ambulating and she is now dependent on others for certain aspects of self-care. She's currently using a cane to ambulate and in the past has even used a rolling walker, even with these assistive devices she has difficulty getting to the bathroom in time and has had 3 episodes of incontinence where she did not reach the bathroom fast enough in the past 3 months. She's having to rely on others for cooking and feeding due to not being able to stand at oven range worsening for more than 1-2 minutes without having pain and dizziness. She's tried using a manual nonpowered rolling wheelchair out in the public however she tires within a few seconds due to shortness of breath and her COPD status. Because of her obesity a scooter with his steering tiller has been difficult for her to use in the past due to shifting body weight and lack of postural stability. I genuinely feel that she would benefit from a power wheelchair in that she read more freedom with preparing meals, eating healthier foods, and being able to reach the bathroom in a timely manner to avoid incontinence accidents. She has difficulty walking further than 25 yards or standing for more than 1-2 minutes due to shortness of breath and dizziness, respectively. She is oriented and I believe she can safely use a power wheelchair at home. I expect that her overall state  of health will improve with the use of a power wheelchair  Review Of Systems Outlined In HPI  Past Medical History  Diagnosis Date  . Hyperlipidemia   . Thyroid disease   . Diabetes mellitus without complication [facility])     Past Surgical History  Procedure Laterality Date  . Thyroidectomy, partial      right side removed  . Abdominal hysterectomy    . Cervical fusion    . Carpal tunnel release      both wrists  . Gallbladder surgery    . Right foot surgery    . Cataract extraction, bilateral     Family History  Problem Relation Age of Onset  . Uterine cancer    . Pancreatic cancer    . Heart attack Father   . Depression Mother   . Diabetes Father   . Hypertension      parents    Social History   Social History  . Marital Status: Divorced    Spouse Name: N/A  . Number of Children: N/A  . Years of Education: N/A   Occupational History  . Not on file.   Social History Main Topics  . Smoking status: Former Smoker    Quit date: 02/12/2014  . Smokeless tobacco: Not on file  . Alcohol Use: No  . Drug Use: No  . Sexual Activity: Not Currently   Other Topics Concern  . Not on file   Social History Narrative     Objective: BP 126/69 mmHg  Pulse 81  Wt 241 lb (109.317 kg)  Height: 65 inches  General: Alert and Oriented, No Acute Distress HEENT: Pupils equal, round, reactive to light. Conjunctivae clear.  External ears unremarkable, canals clear with intact TMs with appropriate landmarks.  Middle ear appears open without effusion. Pink inferior turbinates.  Moist mucous membranes, pharynx without inflammation nor lesions.  Neck supple without palpable lymphadenopathy nor abnormal masses. Lungs: Clear to auscultation bilaterally, no wheezing/ronchi/rales.  Comfortable work of breathing. Good air movement. Cardiac: Regular rate and rhythm. Normal S1/S2.  No murmurs, rubs, nor gallops.   Abdomen: Normal bowel sounds, soft and non tender without palpable  masses. Extremities: No peripheral edema.  Strong peripheral pulses.  Left lower extremity: Full range of motion of the entire left leg. Knee flexion and extension are 3/5 in strength, hip extension and flexion 4/5 in strength, ankle flexion and extension 5 over 5 in strength. Right lower extremity:Full range of motion of the entire right leg. Knee flexion and extension are 3/5 in strength, hip extension and flexion 4/5 in strength, ankle flexion and extension 5 over 5 in strength. Right upper extremity: Full range of motion of the entire right arm. 5 out of 5 shoulder abduction extension and flexion. 4 out of 5 elbow flexion and extension. 5 out of 5 wrist flexion and extension. 5 out of 5 grip. Left upper extremity: Full range of motion of the entire left arm. 5 out of 5 shoulder abduction extension and flexion. 5 out of 5 elbow flexion and extension. 5 out of 5 wrist flexion and extension. 5 out of 5 grip. Mental Status: No depression, anxiety, nor agitation. Skin: Warm and dry.  Assessment & Plan: Sarah Wright was seen today for face to face mobility evaluation.  Diagnoses and all orders for this visit:  Chronic obstructive pulmonary disease, unspecified COPD type ([place])  DDD (degenerative disc disease), lumbar  Dizziness  Other osteoarthritis of spine, lumbar region   Pursuing eligibility for coverage on a power wheelchair I do believe this would help her quality of life and overall state of health.  Return if symptoms worsen or fail to improve.  40 minutes spent face-to-face during visit today of which at least 50% was counseling or coordinating care regarding: 1. Chronic obstructive pulmonary disease, unspecified COPD type ([place])   2. DDD (degenerative disc disease), lumbar   3. Dizziness   4. Other osteoarthritis of spine, lumbar region

## 2015-09-01 ENCOUNTER — Other Ambulatory Visit: Payer: Self-pay | Admitting: Family Medicine

## 2015-09-02 ENCOUNTER — Telehealth: Payer: Self-pay

## 2015-09-02 NOTE — Telephone Encounter (Signed)
Pt.notified

## 2015-09-02 NOTE — Telephone Encounter (Signed)
A referral was put in on 07/16/15 for Sarah Wright to go to [facility].  She wants to go to a place in [place] instead.

## 2015-09-03 ENCOUNTER — Encounter: Payer: Self-pay | Admitting: Family Medicine

## 2015-09-07 ENCOUNTER — Telehealth: Payer: Self-pay

## 2015-09-07 ENCOUNTER — Encounter: Payer: Self-pay | Admitting: Family Medicine

## 2015-09-07 NOTE — Telephone Encounter (Signed)
Pt was given number to the new place where referral was placed.

## 2015-09-07 NOTE — Telephone Encounter (Signed)
Patient information sent to [facility] on 09/03/15 - CF

## 2015-09-07 NOTE — Telephone Encounter (Signed)
Please read phone note from 09/02/15.

## 2015-09-08 ENCOUNTER — Encounter: Payer: Self-pay | Admitting: Family Medicine

## 2015-09-13 DIAGNOSIS — G8929 Other chronic pain: Secondary | ICD-10-CM | POA: Diagnosis not present

## 2015-09-13 DIAGNOSIS — M5136 Other intervertebral disc degeneration, lumbar region: Secondary | ICD-10-CM | POA: Diagnosis not present

## 2015-09-13 DIAGNOSIS — Z79891 Long term (current) use of opiate analgesic: Secondary | ICD-10-CM | POA: Diagnosis not present

## 2015-09-13 DIAGNOSIS — M47816 Spondylosis without myelopathy or radiculopathy, lumbar region: Secondary | ICD-10-CM | POA: Diagnosis not present

## 2015-09-14 ENCOUNTER — Telehealth: Payer: Self-pay

## 2015-09-14 NOTE — Telephone Encounter (Signed)
Patient called stated that the wrong referral was placed

## 2015-09-15 NOTE — Telephone Encounter (Signed)
Patient called and states [facility] does not do thyroid biopsies. Called their office to confirm this and they do not do thyroid biopsies. The patient will have to go to [facility] as this is the only other place that does thyroid biopsies. Pt was notified that she will have to go to [place] where we initially sent the referral.

## 2015-09-16 ENCOUNTER — Other Ambulatory Visit: Payer: Self-pay | Admitting: Family Medicine

## 2015-09-17 ENCOUNTER — Other Ambulatory Visit: Payer: Self-pay | Admitting: Family Medicine

## 2015-09-17 ENCOUNTER — Telehealth: Payer: Self-pay | Admitting: *Deleted

## 2015-09-17 DIAGNOSIS — E041 Nontoxic single thyroid nodule: Secondary | ICD-10-CM

## 2015-09-17 NOTE — Telephone Encounter (Signed)
Order for thyroid bx

## 2015-09-17 NOTE — Telephone Encounter (Signed)
Andrea, Rx placed in in-box ready for pickup/faxing.  

## 2015-09-19 ENCOUNTER — Other Ambulatory Visit: Payer: Self-pay | Admitting: Family Medicine

## 2015-09-21 DIAGNOSIS — Z6841 Body Mass Index (BMI) 40.0 and over, adult: Secondary | ICD-10-CM | POA: Diagnosis not present

## 2015-09-23 ENCOUNTER — Telehealth: Payer: Self-pay | Admitting: Family Medicine

## 2015-09-23 DIAGNOSIS — L409 Psoriasis, unspecified: Secondary | ICD-10-CM | POA: Insufficient documentation

## 2015-09-23 MED ORDER — CALCIPOTRIENE 0.005 % EX CREA: TOPICAL_CREAM | CUTANEOUS | Status: DC

## 2015-09-23 NOTE — Telephone Encounter (Signed)
Refill req 

## 2015-09-28 DIAGNOSIS — E1151 Type 2 diabetes mellitus with diabetic peripheral angiopathy without gangrene: Secondary | ICD-10-CM | POA: Diagnosis not present

## 2015-09-28 DIAGNOSIS — M792 Neuralgia and neuritis, unspecified: Secondary | ICD-10-CM | POA: Diagnosis not present

## 2015-09-28 DIAGNOSIS — L84 Corns and callosities: Secondary | ICD-10-CM | POA: Diagnosis not present

## 2015-09-28 DIAGNOSIS — I739 Peripheral vascular disease, unspecified: Secondary | ICD-10-CM | POA: Diagnosis not present

## 2015-09-28 DIAGNOSIS — L603 Nail dystrophy: Secondary | ICD-10-CM | POA: Diagnosis not present

## 2015-09-29 ENCOUNTER — Telehealth: Payer: Self-pay

## 2015-09-29 DIAGNOSIS — Z1159 Encounter for screening for other viral diseases: Secondary | ICD-10-CM

## 2015-09-29 DIAGNOSIS — E119 Type 2 diabetes mellitus without complications: Secondary | ICD-10-CM

## 2015-09-29 NOTE — Telephone Encounter (Signed)
Sarah Wright called and states she received an e-mail from [place]. The e-mail advised her to get hepatitis C screening, a T-dap and urine microalbumin. Can we order the tests and have her come in for a nurse visit for T-dap. It doesn't look like she is due for a follow up.

## 2015-09-29 NOTE — Telephone Encounter (Signed)
Good idea Levada Dy, she can come in for a nurse visit Tdap and I'll put labs for hep c and urine up front.

## 2015-09-30 NOTE — Telephone Encounter (Signed)
Patient advised and scheduled.  

## 2015-10-01 ENCOUNTER — Other Ambulatory Visit (HOSPITAL_COMMUNITY)
Admission: RE | Admit: 2015-10-01 | Discharge: 2015-10-01 | Disposition: A | Discharge status: Home / Self Care | Payer: Medicare Other | Source: Ambulatory Visit | Attending: Radiology | Admitting: Radiology

## 2015-10-01 ENCOUNTER — Ambulatory Visit
Admission: RE | Admit: 2015-10-01 | Discharge: 2015-10-01 | Disposition: A | Discharge status: Home / Self Care | Payer: Medicare Other | Source: Ambulatory Visit | Attending: Family Medicine | Admitting: Family Medicine

## 2015-10-01 DIAGNOSIS — E041 Nontoxic single thyroid nodule: Secondary | ICD-10-CM | POA: Diagnosis not present

## 2015-10-04 ENCOUNTER — Ambulatory Visit (INDEPENDENT_AMBULATORY_CARE_PROVIDER_SITE_OTHER): Payer: Medicare Other | Admitting: Sports Medicine

## 2015-10-04 VITALS — BP 110/66 | HR 77 | Temp 98.1°F

## 2015-10-04 DIAGNOSIS — R413 Other amnesia: Secondary | ICD-10-CM

## 2015-10-04 DIAGNOSIS — E119 Type 2 diabetes mellitus without complications: Secondary | ICD-10-CM | POA: Diagnosis not present

## 2015-10-04 DIAGNOSIS — Z23 Encounter for immunization: Secondary | ICD-10-CM

## 2015-10-04 DIAGNOSIS — Z1159 Encounter for screening for other viral diseases: Secondary | ICD-10-CM | POA: Diagnosis not present

## 2015-10-04 NOTE — Progress Notes (Signed)
Patient came into clinic today for her tdap immunization. Pt reports she saw on MyChart where she was overdue for this and some blood work. Labs were entered by PCP, lab slip given to Pt for completion after leaving clinic. Pt tolerated tdap injection in left deltoid well, no immediate complications. Advised we would contact her with her lab results once they are available. Verbalized understanding. No further questions.

## 2015-10-05 ENCOUNTER — Ambulatory Visit (INDEPENDENT_AMBULATORY_CARE_PROVIDER_SITE_OTHER): Payer: Medicare Other

## 2015-10-05 ENCOUNTER — Encounter: Payer: Self-pay | Admitting: Family Medicine

## 2015-10-05 ENCOUNTER — Ambulatory Visit (INDEPENDENT_AMBULATORY_CARE_PROVIDER_SITE_OTHER): Payer: Medicare Other | Admitting: Family Medicine

## 2015-10-05 VITALS — BP 106/71 | HR 92 | Temp 98.3°F | Wt 250.0 lb

## 2015-10-05 DIAGNOSIS — I517 Cardiomegaly: Secondary | ICD-10-CM | POA: Diagnosis not present

## 2015-10-05 DIAGNOSIS — R05 Cough: Secondary | ICD-10-CM | POA: Diagnosis not present

## 2015-10-05 DIAGNOSIS — R059 Cough, unspecified: Secondary | ICD-10-CM

## 2015-10-05 LAB — MICROALBUMIN / CREATININE URINE RATIO
Creatinine, Urine: 45 mg/dL (ref 20–320)
Microalb, Ur: <0.2 mg/dL

## 2015-10-05 LAB — HEPATITIS C ANTIBODY: HCV AB: NEGATIVE

## 2015-10-05 MED ORDER — AZITHROMYCIN 250 MG PO TABS: 250.0000 mg | ORAL_TABLET | Freq: Every day | ORAL | Status: DC

## 2015-10-05 MED ORDER — IPRATROPIUM-ALBUTEROL 0.5-2.5 (3) MG/3ML IN SOLN
3.0000 mL | Freq: Once | RESPIRATORY_TRACT | Status: AC
  Administered 2015-10-05: 3 mL via RESPIRATORY_TRACT

## 2015-10-05 MED ORDER — PREDNISONE 10 MG PO TABS: 30.0000 mg | ORAL_TABLET | Freq: Every day | ORAL | Status: DC

## 2015-10-05 MED ORDER — GUAIFENESIN-CODEINE 100-10 MG/5ML PO SOLN: 5.0000 mL | Freq: Every evening | ORAL | Status: DC | PRN

## 2015-10-05 NOTE — Patient Instructions (Signed)
Thank you for coming in today. Get xray today.  Call or go to the emergency room if you get worse, have trouble breathing, have chest pains, or palpitations.   Acute Bronchitis Bronchitis is inflammation of the airways that extend from the windpipe into the lungs (bronchi). The inflammation often causes mucus to develop. This leads to a cough, which is the most common symptom of bronchitis.  In acute bronchitis, the condition usually develops suddenly and goes away over time, usually in a couple weeks. Smoking, allergies, and asthma can make bronchitis worse. Repeated episodes of bronchitis may cause further lung problems.  CAUSES Acute bronchitis is most often caused by the same virus that causes a cold. The virus can spread from person to person (contagious) through coughing, sneezing, and touching contaminated objects. SIGNS AND SYMPTOMS   Cough.   Fever.   Coughing up mucus.   Body aches.   Chest congestion.   Chills.   Shortness of breath.   Sore throat.  DIAGNOSIS  Acute bronchitis is usually diagnosed through a physical exam. Your health care provider will also ask you questions about your medical history. Tests, such as chest X-rays, are sometimes done to rule out other conditions.  TREATMENT  Acute bronchitis usually goes away in a couple weeks. Oftentimes, no medical treatment is necessary. Medicines are sometimes given for relief of fever or cough. Antibiotic medicines are usually not needed but may be prescribed in certain situations. In some cases, an inhaler may be recommended to help reduce shortness of breath and control the cough. A cool mist vaporizer may also be used to help thin bronchial secretions and make it easier to clear the chest.  HOME CARE INSTRUCTIONS  Get plenty of rest.   Drink enough fluids to keep your urine clear or pale yellow (unless you have a medical condition that requires fluid restriction). Increasing fluids may help thin your  respiratory secretions (sputum) and reduce chest congestion, and it will prevent dehydration.   Take medicines only as directed by your health care provider.  If you were prescribed an antibiotic medicine, finish it all even if you start to feel better.  Avoid smoking and secondhand smoke. Exposure to cigarette smoke or irritating chemicals will make bronchitis worse. If you are a smoker, consider using nicotine gum or skin patches to help control withdrawal symptoms. Quitting smoking will help your lungs heal faster.   Reduce the chances of another bout of acute bronchitis by washing your hands frequently, avoiding people with cold symptoms, and trying not to touch your hands to your mouth, nose, or eyes.   Keep all follow-up visits as directed by your health care provider.  SEEK MEDICAL CARE IF: Your symptoms do not improve after 1 week of treatment.  SEEK IMMEDIATE MEDICAL CARE IF:  You develop an increased fever or chills.   You have chest pain.   You have severe shortness of breath.  You have bloody sputum.   You develop dehydration.  You faint or repeatedly feel like you are going to pass out.  You develop repeated vomiting.  You develop a severe headache. MAKE SURE YOU:   Understand these instructions.  Will watch your condition.  Will get help right away if you are not doing well or get worse.   This information is not intended to replace advice given to you by your health care provider. Make sure you discuss any questions you have with your health care provider.   Document Released: 05/18/2004 Document  Revised: 05/01/2014 Document Reviewed: 10/01/2012 Elsevier Interactive Patient Education [employer].

## 2015-10-05 NOTE — Progress Notes (Signed)
Sarah Wright is a 70 y.o. female who presents to [facility]: [place] today for cough congestion runny nose and wheezing. Symptoms present for a few days. No fevers or chills vomiting or diarrhea. No chest pain or palpitations. She has not tried any medications yet.   Past Medical History  Diagnosis Date  . Hyperlipidemia   . Thyroid disease   . Diabetes mellitus without complication [facility])    Past Surgical History  Procedure Laterality Date  . Thyroidectomy, partial      right side removed  . Abdominal hysterectomy    . Cervical fusion    . Carpal tunnel release      both wrists  . Gallbladder surgery    . Right foot surgery    . Cataract extraction, bilateral     Social History  Substance Use Topics  . Smoking status: Former Smoker    Quit date: 02/12/2014  . Smokeless tobacco: Not on file  . Alcohol Use: No   family history includes Depression in her mother; Diabetes in her father; Heart attack in her father.  ROS as above:  Medications: Current Outpatient Prescriptions  Medication Sig Dispense Refill  . ALPRAZolam (XANAX) 1 MG tablet TAKE 1 TABLET BY MOUTH TWICE DAILY AS NEEDED FOR ANXIETY 60 tablet 2  . AMBULATORY NON FORMULARY MEDICATION Knee-high, medium compression, graduated compression stockings. Apply to lower extremities. [email or web address], Zippered Compression Stockings, medium circ, long length 1 each 0  . aspirin 81 MG tablet Take 81 mg by mouth daily.    [name] atorvastatin (LIPITOR) 20 MG tablet TAKE 1 TABLET (20 MG TOTAL) BY MOUTH DAILY. 90 tablet 2  . BD PEN NEEDLE NANO U/F 32G X 4 MM MISC USE WITH VICTOZA ONCE DAILY 100 each 11  . benzonatate (TESSALON) 200 MG capsule Take 1 capsule (200 mg total) by mouth at bedtime. Take as needed for cough 12 capsule 0  . budesonide-formoterol (SYMBICORT) 160-4.5 MCG/ACT inhaler Inhale 2 puffs into the  lungs 2 (two) times daily. 1 Inhaler 11  . buPROPion (WELLBUTRIN XL) 150 MG 24 hr tablet Take 2 tablets (300 mg total) by mouth every morning. 60 tablet 3  . calcipotriene (DOVONOX) 0.005 % cream Apply 1-2 grams to affected area 1-2 times daily. 360 g 1  . citalopram (CELEXA) 40 MG tablet TAKE ONE TABLET BY MOUTH ONE TIME DAILY 90 tablet 3  . donepezil (ARICEPT) 10 MG tablet Take 0.5 tablets (5 mg total) by mouth at bedtime. To help preserve memory. 30 tablet 2  . furosemide (LASIX) 20 MG tablet TAKE ONE TABLET BY MOUTH DAILY AS NEEDED FOR ANKLE SWELLING 30 tablet 2  . gabapentin (NEURONTIN) 300 MG capsule TAKE 1 CAP AT BEDTIME X1 WK,TWICE DAILY X1 WK, THEN 3 TIMES DAILY. MAY DOUBLE WEEKLY TO 3600MG /DAY 180 capsule 0  . montelukast (SINGULAIR) 10 MG tablet TAKE 1 TABLET BY MOUTH EVERY DAY AT BEDTIME 30 tablet 0  . OLANZapine (ZYPREXA) 5 MG tablet TAKE 1 TABLET BY MOUTH AT BEDTIME 90 tablet 1  . Oxycodone HCl 10 MG TABS TAKE 1 TABELT BY MOUTH EVERY 6 HOURS FOR 30 DAYS  0  . SPIRIVA HANDIHALER 18 MCG inhalation capsule INHALE ONE CAPSULE VIA HANDIHALER ONE TIME DAILY 30 capsule 0  . temazepam (RESTORIL) 15 MG capsule TAKE ONE CAPSULE BY MOUTH NIGHTLY AT BEDTIME AS NEEDED FOR SLEEP 90 capsule 0  . VICTOZA 18 MG/3ML SOPN INJECT 0.6 MG SUBCUTANEOUSLY ONCE DAILY,  INCREASING BY 0.6 MG WEEKLY UNTIL 1.8 MG DAILY 9 pen 11  . azithromycin (ZITHROMAX) 250 MG tablet Take 1 tablet (250 mg total) by mouth daily. Take first 2 tablets together, then 1 every day until finished. 6 tablet 0  . guaiFENesin-codeine 100-10 MG/5ML syrup Take 5 mLs by mouth at bedtime as needed for cough. 120 mL 0  . predniSONE (DELTASONE) 10 MG tablet Take 3 tablets (30 mg total) by mouth daily with breakfast. 15 tablet 0   No current facility-administered medications for this visit.   Allergies  Allergen Reactions  . Sulfa Antibiotics     Patient had reaction as a child; does not know what type of reaction     Exam:  BP 106/71  mmHg  Pulse 92  Temp(Src) 98.3 F (36.8 C) (Oral)  Wt 250 lb (113.399 kg)  SpO2 94% Gen: Well NAD HEENT: EOMI,  MMM Clear nasal discharge Lungs: Normal work of breathing. Slight wheezing bilaterally. Heart: RRR no MRG Abd: NABS, Soft. Nondistended, Nontender Exts: Brisk capillary refill, warm and well perfused.   Patient was given a 2.5/0.5 mg DuoNeb nebulizer treatment and felt a bit better  No results found for this or any previous visit (from the past 24 hour(s)). No results found.    Assessment and Plan: 70 y.o. female with bronchitis. Discussed options. She was prednisone codeine cough syrup azithromycin and will obtain chest x-ray. Concern for COPD.  Discussed warning signs or symptoms. Please see discharge instructions. Patient expresses understanding.

## 2015-10-06 ENCOUNTER — Other Ambulatory Visit: Payer: Self-pay | Admitting: Sports Medicine

## 2015-10-06 NOTE — Progress Notes (Signed)
Quick Note:  No pneumonia. Return if not better. ______

## 2015-10-12 DIAGNOSIS — M47816 Spondylosis without myelopathy or radiculopathy, lumbar region: Secondary | ICD-10-CM | POA: Diagnosis not present

## 2015-10-12 DIAGNOSIS — M25569 Pain in unspecified knee: Secondary | ICD-10-CM | POA: Diagnosis not present

## 2015-10-12 DIAGNOSIS — M4696 Unspecified inflammatory spondylopathy, lumbar region: Secondary | ICD-10-CM | POA: Diagnosis not present

## 2015-10-12 DIAGNOSIS — G8929 Other chronic pain: Secondary | ICD-10-CM | POA: Diagnosis not present

## 2015-10-19 ENCOUNTER — Other Ambulatory Visit: Payer: Self-pay | Admitting: Family Medicine

## 2015-10-25 ENCOUNTER — Other Ambulatory Visit: Payer: Self-pay | Admitting: Family Medicine

## 2015-11-09 DIAGNOSIS — G8929 Other chronic pain: Secondary | ICD-10-CM | POA: Diagnosis not present

## 2015-11-09 DIAGNOSIS — Z79891 Long term (current) use of opiate analgesic: Secondary | ICD-10-CM | POA: Diagnosis not present

## 2015-11-09 DIAGNOSIS — M47816 Spondylosis without myelopathy or radiculopathy, lumbar region: Secondary | ICD-10-CM | POA: Diagnosis not present

## 2015-11-09 DIAGNOSIS — M5136 Other intervertebral disc degeneration, lumbar region: Secondary | ICD-10-CM | POA: Diagnosis not present

## 2015-11-10 DIAGNOSIS — Z87412 Personal history of vulvar dysplasia: Secondary | ICD-10-CM | POA: Diagnosis not present

## 2015-11-10 DIAGNOSIS — L298 Other pruritus: Secondary | ICD-10-CM | POA: Diagnosis not present

## 2015-11-10 DIAGNOSIS — Z87891 Personal history of nicotine dependence: Secondary | ICD-10-CM | POA: Diagnosis not present

## 2015-11-10 DIAGNOSIS — N898 Other specified noninflammatory disorders of vagina: Secondary | ICD-10-CM | POA: Diagnosis not present

## 2015-11-10 DIAGNOSIS — Z09 Encounter for follow-up examination after completed treatment for conditions other than malignant neoplasm: Secondary | ICD-10-CM | POA: Diagnosis not present

## 2015-11-10 DIAGNOSIS — Z8049 Family history of malignant neoplasm of other genital organs: Secondary | ICD-10-CM | POA: Diagnosis not present

## 2015-11-10 DIAGNOSIS — Z9079 Acquired absence of other genital organ(s): Secondary | ICD-10-CM | POA: Diagnosis not present

## 2015-11-10 DIAGNOSIS — N760 Acute vaginitis: Secondary | ICD-10-CM | POA: Diagnosis not present

## 2015-11-10 DIAGNOSIS — B9689 Other specified bacterial agents as the cause of diseases classified elsewhere: Secondary | ICD-10-CM | POA: Diagnosis not present

## 2015-11-18 ENCOUNTER — Other Ambulatory Visit: Payer: Self-pay | Admitting: Family Medicine

## 2015-11-27 DIAGNOSIS — E11311 Type 2 diabetes mellitus with unspecified diabetic retinopathy with macular edema: Secondary | ICD-10-CM | POA: Diagnosis not present

## 2015-11-27 DIAGNOSIS — H524 Presbyopia: Secondary | ICD-10-CM | POA: Diagnosis not present

## 2015-11-27 DIAGNOSIS — E113213 Type 2 diabetes mellitus with mild nonproliferative diabetic retinopathy with macular edema, bilateral: Secondary | ICD-10-CM | POA: Diagnosis not present

## 2015-11-27 DIAGNOSIS — H353111 Nonexudative age-related macular degeneration, right eye, early dry stage: Secondary | ICD-10-CM | POA: Diagnosis not present

## 2015-11-27 DIAGNOSIS — H353121 Nonexudative age-related macular degeneration, left eye, early dry stage: Secondary | ICD-10-CM | POA: Diagnosis not present

## 2015-11-27 DIAGNOSIS — H52223 Regular astigmatism, bilateral: Secondary | ICD-10-CM | POA: Diagnosis not present

## 2015-11-27 DIAGNOSIS — Z961 Presence of intraocular lens: Secondary | ICD-10-CM | POA: Diagnosis not present

## 2015-11-27 DIAGNOSIS — H43811 Vitreous degeneration, right eye: Secondary | ICD-10-CM | POA: Diagnosis not present

## 2015-11-27 DIAGNOSIS — H5203 Hypermetropia, bilateral: Secondary | ICD-10-CM | POA: Diagnosis not present

## 2015-11-27 DIAGNOSIS — Z794 Long term (current) use of insulin: Secondary | ICD-10-CM | POA: Diagnosis not present

## 2015-11-27 DIAGNOSIS — H3530 Unspecified macular degeneration: Secondary | ICD-10-CM | POA: Diagnosis not present

## 2015-11-30 ENCOUNTER — Other Ambulatory Visit: Payer: Self-pay | Admitting: Sports Medicine

## 2015-12-02 ENCOUNTER — Other Ambulatory Visit: Payer: Self-pay | Admitting: Family Medicine

## 2015-12-08 DIAGNOSIS — M25562 Pain in left knee: Secondary | ICD-10-CM | POA: Diagnosis not present

## 2015-12-08 DIAGNOSIS — M25561 Pain in right knee: Secondary | ICD-10-CM | POA: Diagnosis not present

## 2015-12-08 DIAGNOSIS — M170 Bilateral primary osteoarthritis of knee: Secondary | ICD-10-CM | POA: Diagnosis not present

## 2015-12-14 ENCOUNTER — Other Ambulatory Visit: Payer: Self-pay | Admitting: Family Medicine

## 2015-12-14 DIAGNOSIS — M5136 Other intervertebral disc degeneration, lumbar region: Secondary | ICD-10-CM | POA: Diagnosis not present

## 2015-12-14 DIAGNOSIS — Z79891 Long term (current) use of opiate analgesic: Secondary | ICD-10-CM | POA: Diagnosis not present

## 2015-12-14 DIAGNOSIS — M47816 Spondylosis without myelopathy or radiculopathy, lumbar region: Secondary | ICD-10-CM | POA: Diagnosis not present

## 2015-12-14 DIAGNOSIS — G8929 Other chronic pain: Secondary | ICD-10-CM | POA: Diagnosis not present

## 2015-12-18 ENCOUNTER — Other Ambulatory Visit: Payer: Self-pay | Admitting: Family Medicine

## 2015-12-20 ENCOUNTER — Other Ambulatory Visit: Payer: Self-pay

## 2015-12-20 MED ORDER — FUROSEMIDE 20 MG PO TABS: ORAL_TABLET | ORAL | 0 refills | Status: DC

## 2015-12-23 ENCOUNTER — Other Ambulatory Visit: Payer: Self-pay

## 2015-12-23 ENCOUNTER — Other Ambulatory Visit: Payer: Self-pay | Admitting: Sports Medicine

## 2016-01-06 ENCOUNTER — Other Ambulatory Visit: Payer: Self-pay | Admitting: Family Medicine

## 2016-01-09 DIAGNOSIS — Z23 Encounter for immunization: Secondary | ICD-10-CM | POA: Diagnosis not present

## 2016-01-11 DIAGNOSIS — E1151 Type 2 diabetes mellitus with diabetic peripheral angiopathy without gangrene: Secondary | ICD-10-CM | POA: Diagnosis not present

## 2016-01-11 DIAGNOSIS — M76821 Posterior tibial tendinitis, right leg: Secondary | ICD-10-CM | POA: Diagnosis not present

## 2016-01-11 DIAGNOSIS — I739 Peripheral vascular disease, unspecified: Secondary | ICD-10-CM | POA: Diagnosis not present

## 2016-01-11 DIAGNOSIS — L603 Nail dystrophy: Secondary | ICD-10-CM | POA: Diagnosis not present

## 2016-01-11 DIAGNOSIS — L84 Corns and callosities: Secondary | ICD-10-CM | POA: Diagnosis not present

## 2016-01-12 DIAGNOSIS — M47816 Spondylosis without myelopathy or radiculopathy, lumbar region: Secondary | ICD-10-CM | POA: Diagnosis not present

## 2016-01-12 DIAGNOSIS — G8929 Other chronic pain: Secondary | ICD-10-CM | POA: Diagnosis not present

## 2016-01-12 DIAGNOSIS — Z79891 Long term (current) use of opiate analgesic: Secondary | ICD-10-CM | POA: Diagnosis not present

## 2016-01-12 DIAGNOSIS — M5136 Other intervertebral disc degeneration, lumbar region: Secondary | ICD-10-CM | POA: Diagnosis not present

## 2016-01-12 DIAGNOSIS — K5903 Drug induced constipation: Secondary | ICD-10-CM | POA: Diagnosis not present

## 2016-01-19 ENCOUNTER — Other Ambulatory Visit: Payer: Self-pay | Admitting: Family Medicine

## 2016-01-25 DIAGNOSIS — M25571 Pain in right ankle and joints of right foot: Secondary | ICD-10-CM | POA: Diagnosis not present

## 2016-01-25 DIAGNOSIS — M76821 Posterior tibial tendinitis, right leg: Secondary | ICD-10-CM | POA: Diagnosis not present

## 2016-01-27 ENCOUNTER — Ambulatory Visit (INDEPENDENT_AMBULATORY_CARE_PROVIDER_SITE_OTHER): Payer: Medicare Other

## 2016-01-27 ENCOUNTER — Encounter: Payer: Self-pay | Admitting: Sports Medicine

## 2016-01-27 ENCOUNTER — Other Ambulatory Visit: Payer: Self-pay | Admitting: Nurse Practitioner

## 2016-01-27 ENCOUNTER — Ambulatory Visit (INDEPENDENT_AMBULATORY_CARE_PROVIDER_SITE_OTHER): Payer: Medicare Other | Admitting: Sports Medicine

## 2016-01-27 DIAGNOSIS — I700 Atherosclerosis of aorta: Secondary | ICD-10-CM

## 2016-01-27 DIAGNOSIS — M25511 Pain in right shoulder: Secondary | ICD-10-CM

## 2016-01-27 DIAGNOSIS — R52 Pain, unspecified: Secondary | ICD-10-CM

## 2016-01-27 DIAGNOSIS — S3992XA Unspecified injury of lower back, initial encounter: Secondary | ICD-10-CM | POA: Diagnosis not present

## 2016-01-27 DIAGNOSIS — S4991XA Unspecified injury of right shoulder and upper arm, initial encounter: Secondary | ICD-10-CM | POA: Insufficient documentation

## 2016-01-27 DIAGNOSIS — M4316 Spondylolisthesis, lumbar region: Secondary | ICD-10-CM | POA: Diagnosis not present

## 2016-01-27 DIAGNOSIS — M545 Low back pain: Secondary | ICD-10-CM | POA: Diagnosis not present

## 2016-01-27 NOTE — Progress Notes (Signed)
  Subjective:    CC: Fall with shoulder pain  HPI: This is a pleasant 70 year old female, 2 weeks ago she took a fall, fell onto her right shoulder and had immediate pain, pain is localized at the proximal humerus, she has definite loss of range of motion. Moderate, persistent without radiation.  Past medical history:  Negative.  See flowsheet/record as well for more information.  Surgical history: Negative.  See flowsheet/record as well for more information.  Family history: Negative.  See flowsheet/record as well for more information.  Social history: Negative.  See flowsheet/record as well for more information.  Allergies, and medications have been entered into the medical record, reviewed, and no changes needed.   Review of Systems: No fevers, chills, night sweats, weight loss, chest pain, or shortness of breath.   Objective:    General: Well Developed, well nourished, and in no acute distress.  Neuro: Alert and oriented x3, extra-ocular muscles intact, sensation grossly intact.  HEENT: Normocephalic, atraumatic, pupils equal round reactive to light, neck supple, no masses, no lymphadenopathy, thyroid nonpalpable.  Skin: Warm and dry, no rashes. Cardiac: Regular rate and rhythm, no murmurs rubs or gallops, no lower extremity edema.  Respiratory: Clear to auscultation bilaterally. Not using accessory muscles, speaking in full sentences. Right shoulder: Tender to palpation of the proximal humerus, external rotation to neutral, painful beyond this.  X-rays personally reviewed, no evidence of fracture.  Impression and Recommendations:    Right shoulder injury 2 weeks ago after a fall, suspect proximal humeral fracture. Sling, x-rays, return in 2 weeks. If no fracture we will get her into physical therapy in 2 weeks.

## 2016-01-27 NOTE — Assessment & Plan Note (Signed)
2 weeks ago after a fall, suspect proximal humeral fracture. Sling, x-rays, return in 2 weeks. If no fracture we will get her into physical therapy in 2 weeks.

## 2016-02-01 ENCOUNTER — Other Ambulatory Visit: Payer: Self-pay | Admitting: Family Medicine

## 2016-02-10 ENCOUNTER — Ambulatory Visit (INDEPENDENT_AMBULATORY_CARE_PROVIDER_SITE_OTHER): Payer: Medicare Other | Admitting: Sports Medicine

## 2016-02-10 ENCOUNTER — Encounter: Payer: Self-pay | Admitting: Sports Medicine

## 2016-02-10 DIAGNOSIS — S4991XD Unspecified injury of right shoulder and upper arm, subsequent encounter: Secondary | ICD-10-CM

## 2016-02-10 NOTE — Progress Notes (Signed)
  Subjective:    CC: Right shoulder injury  HPI:  2 weeks ago this pleasant 70 year old female had a fall, she had diffuse left shoulder pain to most movements without evidence of fracture on x-ray. She returns today much better, out of the sling but still with pain over the deltoid, worse with overhead activities. Moderate, persistent without radiation, no mechanical symptoms.  Past medical history:  Negative.  See flowsheet/record as well for more information.  Surgical history: Negative.  See flowsheet/record as well for more information.  Family history: Negative.  See flowsheet/record as well for more information.  Social history: Negative.  See flowsheet/record as well for more information.  Allergies, and medications have been entered into the medical record, reviewed, and no changes needed.   Review of Systems: No fevers, chills, night sweats, weight loss, chest pain, or shortness of breath.   Objective:    General: Well Developed, well nourished, and in no acute distress.  Neuro: Alert and oriented x3, extra-ocular muscles intact, sensation grossly intact.  HEENT: Normocephalic, atraumatic, pupils equal round reactive to light, neck supple, no masses, no lymphadenopathy, thyroid nonpalpable.  Skin: Warm and dry, no rashes. Cardiac: Regular rate and rhythm, no murmurs rubs or gallops, no lower extremity edema.  Respiratory: Clear to auscultation bilaterally. Not using accessory muscles, speaking in full sentences. Right Shoulder: Inspection reveals no abnormalities, atrophy or asymmetry. Palpation is normal with no tenderness over AC joint or bicipital groove. ROM is full in all planes. Rotator cuff strength normal throughout. Positive Neer and Hawkin's tests, empty can. Speeds and Yergason's tests normal. No labral pathology noted with negative Obrien's, negative crank, negative clunk, and good stability. Normal scapular function observed. No painful arc and no drop arm  sign. No apprehension sign  Impression and Recommendations:    Right shoulder injury Overall improving, has discontinued sling. We are going to set her up with home health physical therapy, she does have persistent rotator cuff impingement type symptoms. She is homebound. Return to see me in one month, injection if no better.  I spent 25 minutes with this patient, greater than 50% was face-to-face time counseling regarding the above diagnoses

## 2016-02-10 NOTE — Assessment & Plan Note (Signed)
Overall improving, has discontinued sling. We are going to set her up with home health physical therapy, she does have persistent rotator cuff impingement type symptoms. She is homebound. Return to see me in one month, injection if no better.

## 2016-02-11 DIAGNOSIS — S4991XD Unspecified injury of right shoulder and upper arm, subsequent encounter: Secondary | ICD-10-CM | POA: Diagnosis not present

## 2016-02-11 DIAGNOSIS — H9193 Unspecified hearing loss, bilateral: Secondary | ICD-10-CM | POA: Diagnosis not present

## 2016-02-11 DIAGNOSIS — E119 Type 2 diabetes mellitus without complications: Secondary | ICD-10-CM | POA: Diagnosis not present

## 2016-02-11 DIAGNOSIS — E039 Hypothyroidism, unspecified: Secondary | ICD-10-CM | POA: Diagnosis not present

## 2016-02-11 DIAGNOSIS — R413 Other amnesia: Secondary | ICD-10-CM | POA: Diagnosis not present

## 2016-02-11 DIAGNOSIS — M4306 Spondylolysis, lumbar region: Secondary | ICD-10-CM | POA: Diagnosis not present

## 2016-02-11 DIAGNOSIS — G4700 Insomnia, unspecified: Secondary | ICD-10-CM | POA: Diagnosis not present

## 2016-02-11 DIAGNOSIS — R2689 Other abnormalities of gait and mobility: Secondary | ICD-10-CM | POA: Diagnosis not present

## 2016-02-11 DIAGNOSIS — K219 Gastro-esophageal reflux disease without esophagitis: Secondary | ICD-10-CM | POA: Diagnosis not present

## 2016-02-11 DIAGNOSIS — M170 Bilateral primary osteoarthritis of knee: Secondary | ICD-10-CM | POA: Diagnosis not present

## 2016-02-11 DIAGNOSIS — Z794 Long term (current) use of insulin: Secondary | ICD-10-CM | POA: Diagnosis not present

## 2016-02-11 DIAGNOSIS — J449 Chronic obstructive pulmonary disease, unspecified: Secondary | ICD-10-CM | POA: Diagnosis not present

## 2016-02-11 DIAGNOSIS — I872 Venous insufficiency (chronic) (peripheral): Secondary | ICD-10-CM | POA: Diagnosis not present

## 2016-02-11 DIAGNOSIS — F418 Other specified anxiety disorders: Secondary | ICD-10-CM | POA: Diagnosis not present

## 2016-02-14 ENCOUNTER — Other Ambulatory Visit: Payer: Self-pay | Admitting: Family Medicine

## 2016-02-18 DIAGNOSIS — M4306 Spondylolysis, lumbar region: Secondary | ICD-10-CM | POA: Diagnosis not present

## 2016-02-18 DIAGNOSIS — F418 Other specified anxiety disorders: Secondary | ICD-10-CM | POA: Diagnosis not present

## 2016-02-18 DIAGNOSIS — R2689 Other abnormalities of gait and mobility: Secondary | ICD-10-CM | POA: Diagnosis not present

## 2016-02-18 DIAGNOSIS — J449 Chronic obstructive pulmonary disease, unspecified: Secondary | ICD-10-CM | POA: Diagnosis not present

## 2016-02-18 DIAGNOSIS — S4991XD Unspecified injury of right shoulder and upper arm, subsequent encounter: Secondary | ICD-10-CM | POA: Diagnosis not present

## 2016-02-18 DIAGNOSIS — E119 Type 2 diabetes mellitus without complications: Secondary | ICD-10-CM | POA: Diagnosis not present

## 2016-02-22 DIAGNOSIS — R2689 Other abnormalities of gait and mobility: Secondary | ICD-10-CM | POA: Diagnosis not present

## 2016-02-22 DIAGNOSIS — J449 Chronic obstructive pulmonary disease, unspecified: Secondary | ICD-10-CM | POA: Diagnosis not present

## 2016-02-22 DIAGNOSIS — M4306 Spondylolysis, lumbar region: Secondary | ICD-10-CM | POA: Diagnosis not present

## 2016-02-22 DIAGNOSIS — E119 Type 2 diabetes mellitus without complications: Secondary | ICD-10-CM | POA: Diagnosis not present

## 2016-02-22 DIAGNOSIS — S4991XD Unspecified injury of right shoulder and upper arm, subsequent encounter: Secondary | ICD-10-CM | POA: Diagnosis not present

## 2016-02-22 DIAGNOSIS — F418 Other specified anxiety disorders: Secondary | ICD-10-CM | POA: Diagnosis not present

## 2016-02-25 DIAGNOSIS — J449 Chronic obstructive pulmonary disease, unspecified: Secondary | ICD-10-CM | POA: Diagnosis not present

## 2016-02-25 DIAGNOSIS — E119 Type 2 diabetes mellitus without complications: Secondary | ICD-10-CM | POA: Diagnosis not present

## 2016-02-25 DIAGNOSIS — F418 Other specified anxiety disorders: Secondary | ICD-10-CM | POA: Diagnosis not present

## 2016-02-25 DIAGNOSIS — S4991XD Unspecified injury of right shoulder and upper arm, subsequent encounter: Secondary | ICD-10-CM | POA: Diagnosis not present

## 2016-02-25 DIAGNOSIS — R2689 Other abnormalities of gait and mobility: Secondary | ICD-10-CM | POA: Diagnosis not present

## 2016-02-25 DIAGNOSIS — M4306 Spondylolysis, lumbar region: Secondary | ICD-10-CM | POA: Diagnosis not present

## 2016-02-28 DIAGNOSIS — R2689 Other abnormalities of gait and mobility: Secondary | ICD-10-CM | POA: Diagnosis not present

## 2016-02-28 DIAGNOSIS — M4306 Spondylolysis, lumbar region: Secondary | ICD-10-CM | POA: Diagnosis not present

## 2016-02-28 DIAGNOSIS — E119 Type 2 diabetes mellitus without complications: Secondary | ICD-10-CM | POA: Diagnosis not present

## 2016-02-28 DIAGNOSIS — S4991XD Unspecified injury of right shoulder and upper arm, subsequent encounter: Secondary | ICD-10-CM | POA: Diagnosis not present

## 2016-02-28 DIAGNOSIS — F418 Other specified anxiety disorders: Secondary | ICD-10-CM | POA: Diagnosis not present

## 2016-02-28 DIAGNOSIS — J449 Chronic obstructive pulmonary disease, unspecified: Secondary | ICD-10-CM | POA: Diagnosis not present

## 2016-03-03 DIAGNOSIS — R2689 Other abnormalities of gait and mobility: Secondary | ICD-10-CM | POA: Diagnosis not present

## 2016-03-03 DIAGNOSIS — M4306 Spondylolysis, lumbar region: Secondary | ICD-10-CM | POA: Diagnosis not present

## 2016-03-03 DIAGNOSIS — F418 Other specified anxiety disorders: Secondary | ICD-10-CM | POA: Diagnosis not present

## 2016-03-03 DIAGNOSIS — S4991XD Unspecified injury of right shoulder and upper arm, subsequent encounter: Secondary | ICD-10-CM | POA: Diagnosis not present

## 2016-03-03 DIAGNOSIS — J449 Chronic obstructive pulmonary disease, unspecified: Secondary | ICD-10-CM | POA: Diagnosis not present

## 2016-03-03 DIAGNOSIS — E119 Type 2 diabetes mellitus without complications: Secondary | ICD-10-CM | POA: Diagnosis not present

## 2016-03-07 DIAGNOSIS — F418 Other specified anxiety disorders: Secondary | ICD-10-CM | POA: Diagnosis not present

## 2016-03-07 DIAGNOSIS — R2689 Other abnormalities of gait and mobility: Secondary | ICD-10-CM | POA: Diagnosis not present

## 2016-03-07 DIAGNOSIS — S4991XD Unspecified injury of right shoulder and upper arm, subsequent encounter: Secondary | ICD-10-CM | POA: Diagnosis not present

## 2016-03-07 DIAGNOSIS — J449 Chronic obstructive pulmonary disease, unspecified: Secondary | ICD-10-CM | POA: Diagnosis not present

## 2016-03-07 DIAGNOSIS — E119 Type 2 diabetes mellitus without complications: Secondary | ICD-10-CM | POA: Diagnosis not present

## 2016-03-07 DIAGNOSIS — M4306 Spondylolysis, lumbar region: Secondary | ICD-10-CM | POA: Diagnosis not present

## 2016-03-09 ENCOUNTER — Ambulatory Visit: Payer: Medicare Other | Admitting: Sports Medicine

## 2016-03-09 DIAGNOSIS — M5136 Other intervertebral disc degeneration, lumbar region: Secondary | ICD-10-CM | POA: Diagnosis not present

## 2016-03-09 DIAGNOSIS — Z79891 Long term (current) use of opiate analgesic: Secondary | ICD-10-CM | POA: Diagnosis not present

## 2016-03-09 DIAGNOSIS — M47816 Spondylosis without myelopathy or radiculopathy, lumbar region: Secondary | ICD-10-CM | POA: Diagnosis not present

## 2016-03-09 DIAGNOSIS — G8929 Other chronic pain: Secondary | ICD-10-CM | POA: Diagnosis not present

## 2016-03-10 ENCOUNTER — Ambulatory Visit (INDEPENDENT_AMBULATORY_CARE_PROVIDER_SITE_OTHER): Payer: Medicare Other | Admitting: Sports Medicine

## 2016-03-10 ENCOUNTER — Encounter: Payer: Self-pay | Admitting: Sports Medicine

## 2016-03-10 DIAGNOSIS — E119 Type 2 diabetes mellitus without complications: Secondary | ICD-10-CM | POA: Diagnosis not present

## 2016-03-10 DIAGNOSIS — S4991XD Unspecified injury of right shoulder and upper arm, subsequent encounter: Secondary | ICD-10-CM

## 2016-03-10 DIAGNOSIS — M4306 Spondylolysis, lumbar region: Secondary | ICD-10-CM | POA: Diagnosis not present

## 2016-03-10 DIAGNOSIS — F418 Other specified anxiety disorders: Secondary | ICD-10-CM | POA: Diagnosis not present

## 2016-03-10 DIAGNOSIS — R2689 Other abnormalities of gait and mobility: Secondary | ICD-10-CM | POA: Diagnosis not present

## 2016-03-10 DIAGNOSIS — J449 Chronic obstructive pulmonary disease, unspecified: Secondary | ICD-10-CM | POA: Diagnosis not present

## 2016-03-10 NOTE — Assessment & Plan Note (Signed)
Subacromial impingement type symptoms, persistent despite physical therapy, subacromial injection as above.

## 2016-03-10 NOTE — Progress Notes (Signed)
  Subjective:    CC: Follow-up  HPI: Right shoulder pain: Persistent despite on health physical therapy, localized over the deltoid, moderate, persistent, worse with overhead activities. Agreeable to proceed with injection today.  Past medical history:  Negative.  See flowsheet/record as well for more information.  Surgical history: Negative.  See flowsheet/record as well for more information.  Family history: Negative.  See flowsheet/record as well for more information.  Social history: Negative.  See flowsheet/record as well for more information.  Allergies, and medications have been entered into the medical record, reviewed, and no changes needed.   Review of Systems: No fevers, chills, night sweats, weight loss, chest pain, or shortness of breath.   Objective:    General: Well Developed, well nourished, and in no acute distress.  Neuro: Alert and oriented x3, extra-ocular muscles intact, sensation grossly intact.  HEENT: Normocephalic, atraumatic, pupils equal round reactive to light, neck supple, no masses, no lymphadenopathy, thyroid nonpalpable.  Skin: Warm and dry, no rashes. Cardiac: Regular rate and rhythm, no murmurs rubs or gallops, no lower extremity edema.  Respiratory: Clear to auscultation bilaterally. Not using accessory muscles, speaking in full sentences. Right Shoulder: Inspection reveals no abnormalities, atrophy or asymmetry. Palpation is normal with no tenderness over AC joint or bicipital groove. ROM is full in all planes. Rotator cuff strength weak throughout. Positive Neer and Hawkin's tests, empty can. Speeds and Yergason's tests normal. No labral pathology noted with negative Obrien's, negative crank, negative clunk, and good stability. Normal scapular function observed. No painful arc and no drop arm sign. No apprehension sign  Procedure: Real-time Ultrasound Guided Injection of right subacromial bursa Device: GE Logiq E  Verbal informed consent  obtained.  Time-out conducted.  Noted no overlying erythema, induration, or other signs of local infection.  Skin prepped in a sterile fashion.  Local anesthesia: Topical Ethyl chloride.  With sterile technique and under real time ultrasound guidance:  25-gauge needle advanced into the bursa and 1 mL kenalog 40, 1 mL lidocaine, 1 mL Marcaine injected easily. Completed without difficulty  Pain immediately resolved suggesting accurate placement of the medication.  Advised to call if fevers/chills, erythema, induration, drainage, or persistent bleeding.  Images permanently stored and available for review in the ultrasound unit.  Impression: Technically successful ultrasound guided injection.  Impression and Recommendations:    Right shoulder injury Subacromial impingement type symptoms, persistent despite physical therapy, subacromial injection as above.

## 2016-03-11 ENCOUNTER — Other Ambulatory Visit: Payer: Self-pay | Admitting: Family Medicine

## 2016-03-12 ENCOUNTER — Other Ambulatory Visit: Payer: Self-pay | Admitting: Family Medicine

## 2016-03-13 ENCOUNTER — Other Ambulatory Visit: Payer: Self-pay

## 2016-03-13 MED ORDER — OLANZAPINE 5 MG PO TABS: 5.0000 mg | ORAL_TABLET | Freq: Every day | ORAL | 0 refills | Status: DC

## 2016-03-13 MED ORDER — CITALOPRAM HYDROBROMIDE 40 MG PO TABS: 40.0000 mg | ORAL_TABLET | Freq: Every day | ORAL | 0 refills | Status: DC

## 2016-03-13 MED ORDER — MONTELUKAST SODIUM 10 MG PO TABS: 10.0000 mg | ORAL_TABLET | Freq: Every day | ORAL | 0 refills | Status: DC

## 2016-03-14 ENCOUNTER — Other Ambulatory Visit: Payer: Self-pay | Admitting: Family Medicine

## 2016-03-14 ENCOUNTER — Other Ambulatory Visit: Payer: Self-pay

## 2016-03-14 DIAGNOSIS — F418 Other specified anxiety disorders: Secondary | ICD-10-CM | POA: Diagnosis not present

## 2016-03-14 DIAGNOSIS — J449 Chronic obstructive pulmonary disease, unspecified: Secondary | ICD-10-CM | POA: Diagnosis not present

## 2016-03-14 DIAGNOSIS — M4306 Spondylolysis, lumbar region: Secondary | ICD-10-CM | POA: Diagnosis not present

## 2016-03-14 DIAGNOSIS — E119 Type 2 diabetes mellitus without complications: Secondary | ICD-10-CM | POA: Diagnosis not present

## 2016-03-14 DIAGNOSIS — R2689 Other abnormalities of gait and mobility: Secondary | ICD-10-CM | POA: Diagnosis not present

## 2016-03-14 DIAGNOSIS — S4991XD Unspecified injury of right shoulder and upper arm, subsequent encounter: Secondary | ICD-10-CM | POA: Diagnosis not present

## 2016-03-14 DIAGNOSIS — E7849 Other hyperlipidemia: Secondary | ICD-10-CM

## 2016-03-14 MED ORDER — ATORVASTATIN CALCIUM 20 MG PO TABS: ORAL_TABLET | ORAL | 0 refills | Status: DC

## 2016-03-17 DIAGNOSIS — M4306 Spondylolysis, lumbar region: Secondary | ICD-10-CM | POA: Diagnosis not present

## 2016-03-17 DIAGNOSIS — E119 Type 2 diabetes mellitus without complications: Secondary | ICD-10-CM | POA: Diagnosis not present

## 2016-03-17 DIAGNOSIS — J449 Chronic obstructive pulmonary disease, unspecified: Secondary | ICD-10-CM | POA: Diagnosis not present

## 2016-03-17 DIAGNOSIS — F418 Other specified anxiety disorders: Secondary | ICD-10-CM | POA: Diagnosis not present

## 2016-03-17 DIAGNOSIS — S4991XD Unspecified injury of right shoulder and upper arm, subsequent encounter: Secondary | ICD-10-CM | POA: Diagnosis not present

## 2016-03-17 DIAGNOSIS — R2689 Other abnormalities of gait and mobility: Secondary | ICD-10-CM | POA: Diagnosis not present

## 2016-03-20 ENCOUNTER — Other Ambulatory Visit: Payer: Self-pay

## 2016-03-20 ENCOUNTER — Other Ambulatory Visit: Payer: Self-pay | Admitting: *Deleted

## 2016-03-20 DIAGNOSIS — E119 Type 2 diabetes mellitus without complications: Secondary | ICD-10-CM | POA: Diagnosis not present

## 2016-03-20 DIAGNOSIS — S4991XD Unspecified injury of right shoulder and upper arm, subsequent encounter: Secondary | ICD-10-CM | POA: Diagnosis not present

## 2016-03-20 DIAGNOSIS — J449 Chronic obstructive pulmonary disease, unspecified: Secondary | ICD-10-CM | POA: Diagnosis not present

## 2016-03-20 DIAGNOSIS — M4306 Spondylolysis, lumbar region: Secondary | ICD-10-CM | POA: Diagnosis not present

## 2016-03-20 DIAGNOSIS — R2689 Other abnormalities of gait and mobility: Secondary | ICD-10-CM | POA: Diagnosis not present

## 2016-03-20 DIAGNOSIS — F418 Other specified anxiety disorders: Secondary | ICD-10-CM | POA: Diagnosis not present

## 2016-03-20 MED ORDER — ALPRAZOLAM 1 MG PO TABS: 1.0000 mg | ORAL_TABLET | Freq: Two times a day (BID) | ORAL | 0 refills | Status: DC | PRN

## 2016-03-20 MED ORDER — FUROSEMIDE 20 MG PO TABS: ORAL_TABLET | ORAL | 0 refills | Status: DC

## 2016-03-20 NOTE — Telephone Encounter (Signed)
Pt asked for rf on xanax. She has an appt on 03/28/2016 with Sarah Wright.Audelia Hives [place]

## 2016-03-22 DIAGNOSIS — F418 Other specified anxiety disorders: Secondary | ICD-10-CM | POA: Diagnosis not present

## 2016-03-22 DIAGNOSIS — J449 Chronic obstructive pulmonary disease, unspecified: Secondary | ICD-10-CM | POA: Diagnosis not present

## 2016-03-22 DIAGNOSIS — S4991XD Unspecified injury of right shoulder and upper arm, subsequent encounter: Secondary | ICD-10-CM | POA: Diagnosis not present

## 2016-03-22 DIAGNOSIS — E119 Type 2 diabetes mellitus without complications: Secondary | ICD-10-CM | POA: Diagnosis not present

## 2016-03-22 DIAGNOSIS — M4306 Spondylolysis, lumbar region: Secondary | ICD-10-CM | POA: Diagnosis not present

## 2016-03-22 DIAGNOSIS — R2689 Other abnormalities of gait and mobility: Secondary | ICD-10-CM | POA: Diagnosis not present

## 2016-03-23 DIAGNOSIS — R2689 Other abnormalities of gait and mobility: Secondary | ICD-10-CM | POA: Diagnosis not present

## 2016-03-23 DIAGNOSIS — M4306 Spondylolysis, lumbar region: Secondary | ICD-10-CM | POA: Diagnosis not present

## 2016-03-23 DIAGNOSIS — E119 Type 2 diabetes mellitus without complications: Secondary | ICD-10-CM | POA: Diagnosis not present

## 2016-03-23 DIAGNOSIS — J449 Chronic obstructive pulmonary disease, unspecified: Secondary | ICD-10-CM | POA: Diagnosis not present

## 2016-03-23 DIAGNOSIS — F418 Other specified anxiety disorders: Secondary | ICD-10-CM | POA: Diagnosis not present

## 2016-03-23 DIAGNOSIS — S4991XD Unspecified injury of right shoulder and upper arm, subsequent encounter: Secondary | ICD-10-CM | POA: Diagnosis not present

## 2016-03-28 ENCOUNTER — Ambulatory Visit (INDEPENDENT_AMBULATORY_CARE_PROVIDER_SITE_OTHER): Payer: Medicare Other | Admitting: Physician Assistant

## 2016-03-28 ENCOUNTER — Encounter: Payer: Self-pay | Admitting: Physician Assistant

## 2016-03-28 VITALS — BP 102/62 | HR 78 | Ht 65.0 in | Wt 253.0 lb

## 2016-03-28 DIAGNOSIS — M170 Bilateral primary osteoarthritis of knee: Secondary | ICD-10-CM

## 2016-03-28 DIAGNOSIS — J449 Chronic obstructive pulmonary disease, unspecified: Secondary | ICD-10-CM | POA: Diagnosis not present

## 2016-03-28 DIAGNOSIS — Z23 Encounter for immunization: Secondary | ICD-10-CM | POA: Diagnosis not present

## 2016-03-28 DIAGNOSIS — E119 Type 2 diabetes mellitus without complications: Secondary | ICD-10-CM

## 2016-03-28 DIAGNOSIS — I700 Atherosclerosis of aorta: Secondary | ICD-10-CM

## 2016-03-28 DIAGNOSIS — M47816 Spondylosis without myelopathy or radiculopathy, lumbar region: Secondary | ICD-10-CM

## 2016-03-28 DIAGNOSIS — I872 Venous insufficiency (chronic) (peripheral): Secondary | ICD-10-CM | POA: Diagnosis not present

## 2016-03-28 DIAGNOSIS — F329 Major depressive disorder, single episode, unspecified: Secondary | ICD-10-CM

## 2016-03-28 DIAGNOSIS — R2689 Other abnormalities of gait and mobility: Secondary | ICD-10-CM | POA: Diagnosis not present

## 2016-03-28 DIAGNOSIS — S4991XD Unspecified injury of right shoulder and upper arm, subsequent encounter: Secondary | ICD-10-CM | POA: Diagnosis not present

## 2016-03-28 DIAGNOSIS — M5136 Other intervertebral disc degeneration, lumbar region: Secondary | ICD-10-CM

## 2016-03-28 DIAGNOSIS — M4306 Spondylolysis, lumbar region: Secondary | ICD-10-CM | POA: Diagnosis not present

## 2016-03-28 DIAGNOSIS — E079 Disorder of thyroid, unspecified: Secondary | ICD-10-CM

## 2016-03-28 DIAGNOSIS — F418 Other specified anxiety disorders: Secondary | ICD-10-CM

## 2016-03-28 DIAGNOSIS — F32A Depression, unspecified: Secondary | ICD-10-CM

## 2016-03-28 DIAGNOSIS — F419 Anxiety disorder, unspecified: Secondary | ICD-10-CM

## 2016-03-28 DIAGNOSIS — R413 Other amnesia: Secondary | ICD-10-CM

## 2016-03-28 LAB — POCT GLYCOSYLATED HEMOGLOBIN (HGB A1C): HEMOGLOBIN A1C: 6.1

## 2016-03-28 MED ORDER — MONTELUKAST SODIUM 10 MG PO TABS: 10.0000 mg | ORAL_TABLET | Freq: Every day | ORAL | 4 refills | Status: DC

## 2016-03-28 MED ORDER — GABAPENTIN 300 MG PO CAPS: ORAL_CAPSULE | ORAL | 0 refills | Status: DC

## 2016-03-28 MED ORDER — FUROSEMIDE 20 MG PO TABS: ORAL_TABLET | ORAL | 1 refills | Status: DC

## 2016-03-28 MED ORDER — OLANZAPINE 5 MG PO TABS: 5.0000 mg | ORAL_TABLET | Freq: Every day | ORAL | 1 refills | Status: DC

## 2016-03-28 MED ORDER — ALPRAZOLAM 1 MG PO TABS: 1.0000 mg | ORAL_TABLET | Freq: Two times a day (BID) | ORAL | 5 refills | Status: DC | PRN

## 2016-03-28 MED ORDER — DONEPEZIL HCL 10 MG PO TABS: ORAL_TABLET | ORAL | 1 refills | Status: DC

## 2016-03-28 MED ORDER — CITALOPRAM HYDROBROMIDE 40 MG PO TABS: 40.0000 mg | ORAL_TABLET | Freq: Every day | ORAL | 1 refills | Status: DC

## 2016-03-28 NOTE — Patient Instructions (Signed)
Increase aricept to one full tablet.  Can consider below medications.   Memantine extended release capsules What is this medicine? MEMANTINE (MEM an teen) is used to treat dementia caused by Alzheimer's disease. COMMON BRAND NAME(S): Namenda XR What should I tell my health care provider before I take this medicine? They need to know if you have any of these conditions: -difficulty passing urine -kidney disease -liver disease -seizures -an unusual or allergic reaction to memantine, other medicines, foods, dyes, or preservatives -pregnant or trying to get pregnant -breast-feeding How should I use this medicine? Take this medicine by mouth with a glass of water. Follow the directions on the prescription label. You may take this medicine with or without food. You may swallow the capsules whole or open them and sprinkle the entire contents on applesauce before swallowing. Other than sprinkling the medicine on applesauce, the capsules should be swallowed whole and not divided, chewed, or crushed. Take your doses at regular intervals. Do not take your medicine more often than directed. Continue to take your medicine even if you feel better. Do not stop taking except on the advice of your doctor or health care professional. Talk to your pediatrician regarding the use of this medicine in children. Special care may be needed. What if I miss a dose? If you miss a dose, take it as soon as you can. If it is almost time for your next dose, take only that dose. Do not take double or extra doses. If you do not take your medicine for several days, contact your health care provider. Your dose may need to be changed. What may interact with this medicine? -acetazolamide -amantadine -cimetidine -dextromethorphan -dofetilide -hydrochlorothiazide -ketamine -metformin -methazolamide -quinidine -ranitidine -sodium bicarbonate -triamterene What should I watch for while using this medicine? Visit your  doctor or health care professional for regular checks on your progress. Check with your doctor or health care professional if there is no improvement in your symptoms or if they get worse. You may get drowsy or dizzy. Do not drive, use machinery, or do anything that needs mental alertness until you know how this drug affects you. Do not stand or sit up quickly, especially if you are an older patient. This reduces the risk of dizzy or fainting spells. Alcohol can make you more drowsy and dizzy. Avoid alcoholic drinks. What side effects may I notice from receiving this medicine? Side effects that you should report to your doctor or health care professional as soon as possible: -agitation or a feeling of restlessness -allergic reactions like skin rash, itching or hives, swelling of the face, lips, or tongue -depressed mood -dizziness -hallucinations -redness, blistering, peeling or loosening of the skin, including inside the mouth -seizures -vomiting Side effects that usually do not require medical attention (report to your doctor or health care professional if they continue or are bothersome): -constipation -diarrhea -headache -nausea -trouble sleeping Where should I keep my medicine? Keep out of the reach of children. Store at room temperature between 15 degrees and 30 degrees C (59 degrees and 86 degrees F). Throw away any unused medicine after the expiration date.  2017 Elsevier/Gold Standard (2015-05-13 10:09:47)

## 2016-03-28 NOTE — Progress Notes (Signed)
Subjective:    Patient ID: Sarah Wright, female    DOB: 1945/06/27, 70 y.o.   MRN: [id number]  HPI  Pt is a 70 yo female who presents to the clinic to establish care after pCP left clinic.   She needs refills on medications for maintanence.   DM- doing well on victoza. Walking a few times a week. [facility]. No hypoglycemic events. No open leg or feet sores. Not checking sugars.   .. Active Ambulatory Problems    Diagnosis Date Noted  . Anxiety and depression 03/16/2014  . Hyperlipidemia 03/16/2014  . COLD (chronic obstructive lung disease) ([place]) 03/16/2014  . Insomnia 03/16/2014  . Esophageal reflux 03/16/2014  . Bilateral hand pain 03/16/2014  . DDD (degenerative disc disease), lumbar 03/16/2014  . Lumbar spondylosis 03/17/2014  . Aortic atherosclerosis ([place]) 03/17/2014  . Chronic venous insufficiency 03/27/2014  . H/O colonoscopy 04/02/2014  . Thyroid disease 04/02/2014  . Cataract 04/02/2014  . Type 2 diabetes mellitus ([place]) 04/02/2014  . Primary osteoarthritis of both knees 07/21/2014  . Morbid obesity ([place]) 10/07/2014  . Dizziness 01/04/2015  . Sensorineural hearing loss of both ears 01/26/2015  . Thyroid nodule 06/22/2015  . Memory loss 07/29/2015  . Psoriasis 09/23/2015  . Right shoulder injury 01/27/2016   Resolved Ambulatory Problems    Diagnosis Date Noted  . No Resolved Ambulatory Problems   Past Medical History:  Diagnosis Date  . Diabetes mellitus without complication ([place])   . Hyperlipidemia   . Thyroid disease    .[name] Family History  Problem Relation Age of Onset  . Uterine cancer    . Pancreatic cancer    . Heart attack Father   . Depression Mother   . Diabetes Father   . Hypertension      parents   .[name] Social History   Social History  . Marital status: Divorced    Spouse name: N/A  . Number of children: N/A  . Years of education: N/A   Occupational History  . Not on file.   Social History Main  Topics  . Smoking status: Former Smoker    Quit date: 02/12/2014  . Smokeless tobacco: Not on file  . Alcohol use No  . Drug use: No  . Sexual activity: Not Currently   Other Topics Concern  . Not on file   Social History Narrative  . No narrative on file      Review of Systems  All other systems reviewed and are negative.      Objective:   Physical Exam  Constitutional: She is oriented to person, place, and time. She appears well-developed and well-nourished.  Morbidly obese.   HENT:  Head: Normocephalic and atraumatic.  Eyes: Conjunctivae are normal.  Cardiovascular: Normal rate, regular rhythm and normal heart sounds.   Pulmonary/Chest: Effort normal and breath sounds normal.  Neurological: She is alert and oriented to person, place, and time.  Psychiatric: She has a normal mood and affect. Her behavior is normal.          Assessment & Plan:  [name] KitchenMarland KitchenLuanne was seen today for diabetes.  Diagnoses and all orders for this visit:  Type 2 diabetes mellitus without complication, without long-term current use of insulin (HCC) -     POCT HgB A1C  Need for prophylactic vaccination against Streptococcus pneumoniae (pneumococcus) -     Pneumococcal polysaccharide vaccine 23-valent greater than or equal to 2yo subcutaneous/IM  Chronic venous insufficiency -  furosemide (LASIX) 20 MG tablet; TAKE ONE TABLET BY MOUTH DAILY AS NEEDED FOR ANKLE SWELLING  Aortic atherosclerosis (HCC)  Thyroid disease  Lumbar spondylosis -     gabapentin (NEURONTIN) 300 MG capsule; TAKE 1 CAP AT BEDTIME X1 WK,TWICE DAILY X1 WK, THEN 3 TIMES DAILY. MAY DOUBLE WEEKLY TO 3600MG /DAY  DDD (degenerative disc disease), lumbar -     gabapentin (NEURONTIN) 300 MG capsule; TAKE 1 CAP AT BEDTIME X1 WK,TWICE DAILY X1 WK, THEN 3 TIMES DAILY. MAY DOUBLE WEEKLY TO 3600MG /DAY  Memory loss -     donepezil (ARICEPT) 10 MG tablet; TAKE ONE TABLET (5 MG TOTAL) BY MOUTH AT BEDTIME. TO HELP PRESERVE  MEMORY.  Anxiety and depression -     OLANZapine (ZYPREXA) 5 MG tablet; Take 1 tablet (5 mg total) by mouth at bedtime. -     ALPRAZolam (XANAX) 1 MG tablet; Take 1 tablet (1 mg total) by mouth 2 (two) times daily as needed for anxiety. -     citalopram (CELEXA) 40 MG tablet; Take 1 tablet (40 mg total) by mouth daily.  Primary osteoarthritis of both knees -     gabapentin (NEURONTIN) 300 MG capsule; TAKE 1 CAP AT BEDTIME X1 WK,TWICE DAILY X1 WK, THEN 3 TIMES DAILY. MAY DOUBLE WEEKLY TO 3600MG /DAY  Other orders -     montelukast (SINGULAIR) 10 MG tablet; Take 1 tablet (10 mg total) by mouth at bedtime. -     Discontinue: donepezil (ARICEPT) 10 MG tablet; TAKE ONE-HALF TABLET (5 MG TOTAL) BY MOUTH AT BEDTIME. TO HELP PRESERVE MEMORY.      .. Lab Results  Component Value Date   HGBA1C 6.1 03/28/2016   Improved from 7.4 to 6.1 over 8 months.  Continue with victoza. Follow up in 3 months.  Pneumonia shot given today.  Will try to locate eye exam.  Per pt memory seems to be getting worse. Increase to one full tablet of aricept.

## 2016-03-29 DIAGNOSIS — S4991XD Unspecified injury of right shoulder and upper arm, subsequent encounter: Secondary | ICD-10-CM | POA: Diagnosis not present

## 2016-03-29 DIAGNOSIS — E119 Type 2 diabetes mellitus without complications: Secondary | ICD-10-CM | POA: Diagnosis not present

## 2016-03-29 DIAGNOSIS — M4306 Spondylolysis, lumbar region: Secondary | ICD-10-CM | POA: Diagnosis not present

## 2016-03-29 DIAGNOSIS — J449 Chronic obstructive pulmonary disease, unspecified: Secondary | ICD-10-CM | POA: Diagnosis not present

## 2016-03-29 DIAGNOSIS — R2689 Other abnormalities of gait and mobility: Secondary | ICD-10-CM | POA: Diagnosis not present

## 2016-03-29 DIAGNOSIS — F418 Other specified anxiety disorders: Secondary | ICD-10-CM | POA: Diagnosis not present

## 2016-03-30 ENCOUNTER — Telehealth: Payer: Self-pay

## 2016-03-30 DIAGNOSIS — J449 Chronic obstructive pulmonary disease, unspecified: Secondary | ICD-10-CM | POA: Diagnosis not present

## 2016-03-30 DIAGNOSIS — E119 Type 2 diabetes mellitus without complications: Secondary | ICD-10-CM | POA: Diagnosis not present

## 2016-03-30 DIAGNOSIS — M4306 Spondylolysis, lumbar region: Secondary | ICD-10-CM | POA: Diagnosis not present

## 2016-03-30 DIAGNOSIS — R2689 Other abnormalities of gait and mobility: Secondary | ICD-10-CM | POA: Diagnosis not present

## 2016-03-30 DIAGNOSIS — S4991XD Unspecified injury of right shoulder and upper arm, subsequent encounter: Secondary | ICD-10-CM | POA: Diagnosis not present

## 2016-03-30 DIAGNOSIS — F418 Other specified anxiety disorders: Secondary | ICD-10-CM | POA: Diagnosis not present

## 2016-03-30 NOTE — Telephone Encounter (Signed)
[facility] Santiago Glad [phone] reports that pt declined PT today due to her dog dying last night. This will be considered a missed visit.

## 2016-04-03 DIAGNOSIS — S4991XD Unspecified injury of right shoulder and upper arm, subsequent encounter: Secondary | ICD-10-CM | POA: Diagnosis not present

## 2016-04-03 DIAGNOSIS — F418 Other specified anxiety disorders: Secondary | ICD-10-CM | POA: Diagnosis not present

## 2016-04-03 DIAGNOSIS — R2689 Other abnormalities of gait and mobility: Secondary | ICD-10-CM | POA: Diagnosis not present

## 2016-04-03 DIAGNOSIS — J449 Chronic obstructive pulmonary disease, unspecified: Secondary | ICD-10-CM | POA: Diagnosis not present

## 2016-04-03 DIAGNOSIS — E119 Type 2 diabetes mellitus without complications: Secondary | ICD-10-CM | POA: Diagnosis not present

## 2016-04-03 DIAGNOSIS — M4306 Spondylolysis, lumbar region: Secondary | ICD-10-CM | POA: Diagnosis not present

## 2016-04-04 DIAGNOSIS — R2689 Other abnormalities of gait and mobility: Secondary | ICD-10-CM | POA: Diagnosis not present

## 2016-04-04 DIAGNOSIS — S4991XD Unspecified injury of right shoulder and upper arm, subsequent encounter: Secondary | ICD-10-CM | POA: Diagnosis not present

## 2016-04-04 DIAGNOSIS — J449 Chronic obstructive pulmonary disease, unspecified: Secondary | ICD-10-CM | POA: Diagnosis not present

## 2016-04-04 DIAGNOSIS — E119 Type 2 diabetes mellitus without complications: Secondary | ICD-10-CM | POA: Diagnosis not present

## 2016-04-04 DIAGNOSIS — M4306 Spondylolysis, lumbar region: Secondary | ICD-10-CM | POA: Diagnosis not present

## 2016-04-04 DIAGNOSIS — F418 Other specified anxiety disorders: Secondary | ICD-10-CM | POA: Diagnosis not present

## 2016-04-05 DIAGNOSIS — M4306 Spondylolysis, lumbar region: Secondary | ICD-10-CM | POA: Diagnosis not present

## 2016-04-05 DIAGNOSIS — E119 Type 2 diabetes mellitus without complications: Secondary | ICD-10-CM | POA: Diagnosis not present

## 2016-04-05 DIAGNOSIS — R2689 Other abnormalities of gait and mobility: Secondary | ICD-10-CM | POA: Diagnosis not present

## 2016-04-05 DIAGNOSIS — F418 Other specified anxiety disorders: Secondary | ICD-10-CM | POA: Diagnosis not present

## 2016-04-05 DIAGNOSIS — J449 Chronic obstructive pulmonary disease, unspecified: Secondary | ICD-10-CM | POA: Diagnosis not present

## 2016-04-05 DIAGNOSIS — S4991XD Unspecified injury of right shoulder and upper arm, subsequent encounter: Secondary | ICD-10-CM | POA: Diagnosis not present

## 2016-04-06 DIAGNOSIS — E119 Type 2 diabetes mellitus without complications: Secondary | ICD-10-CM | POA: Diagnosis not present

## 2016-04-06 DIAGNOSIS — J449 Chronic obstructive pulmonary disease, unspecified: Secondary | ICD-10-CM | POA: Diagnosis not present

## 2016-04-06 DIAGNOSIS — M4306 Spondylolysis, lumbar region: Secondary | ICD-10-CM | POA: Diagnosis not present

## 2016-04-06 DIAGNOSIS — S4991XD Unspecified injury of right shoulder and upper arm, subsequent encounter: Secondary | ICD-10-CM | POA: Diagnosis not present

## 2016-04-06 DIAGNOSIS — F418 Other specified anxiety disorders: Secondary | ICD-10-CM | POA: Diagnosis not present

## 2016-04-06 DIAGNOSIS — R2689 Other abnormalities of gait and mobility: Secondary | ICD-10-CM | POA: Diagnosis not present

## 2016-04-07 ENCOUNTER — Ambulatory Visit (INDEPENDENT_AMBULATORY_CARE_PROVIDER_SITE_OTHER): Payer: Medicare Other | Admitting: Sports Medicine

## 2016-04-07 ENCOUNTER — Encounter: Payer: Self-pay | Admitting: Sports Medicine

## 2016-04-07 DIAGNOSIS — S4991XD Unspecified injury of right shoulder and upper arm, subsequent encounter: Secondary | ICD-10-CM | POA: Diagnosis not present

## 2016-04-07 NOTE — Assessment & Plan Note (Signed)
Resolved after subacromial injection at the last visit, return as needed.

## 2016-04-07 NOTE — Progress Notes (Signed)
  Subjective:    CC: Follow-up  HPI: Right shoulder pain: Improved completely after subacromial injection  Past medical history:  Negative.  See flowsheet/record as well for more information.  Surgical history: Negative.  See flowsheet/record as well for more information.  Family history: Negative.  See flowsheet/record as well for more information.  Social history: Negative.  See flowsheet/record as well for more information.  Allergies, and medications have been entered into the medical record, reviewed, and no changes needed.   Review of Systems: No fevers, chills, night sweats, weight loss, chest pain, or shortness of breath.   Objective:    General: Well Developed, well nourished, and in no acute distress.  Neuro: Alert and oriented x3, extra-ocular muscles intact, sensation grossly intact.  HEENT: Normocephalic, atraumatic, pupils equal round reactive to light, neck supple, no masses, no lymphadenopathy, thyroid nonpalpable.  Skin: Warm and dry, no rashes. Cardiac: Regular rate and rhythm, no murmurs rubs or gallops, no lower extremity edema.  Respiratory: Clear to auscultation bilaterally. Not using accessory muscles, speaking in full sentences. Right Shoulder: Inspection reveals no abnormalities, atrophy or asymmetry. Palpation is normal with no tenderness over AC joint or bicipital groove. ROM is full in all planes. Rotator cuff strength normal throughout. No signs of impingement with negative Neer and Hawkin's tests, empty can. Speeds and Yergason's tests normal. No labral pathology noted with negative Obrien's, negative crank, negative clunk, and good stability. Normal scapular function observed. No painful arc and no drop arm sign. No apprehension sign  Impression and Recommendations:    Right shoulder injury Resolved after subacromial injection at the last visit, return as needed.

## 2016-04-11 ENCOUNTER — Other Ambulatory Visit: Payer: Self-pay | Admitting: *Deleted

## 2016-04-11 MED ORDER — BUPROPION HCL ER (XL) 150 MG PO TB24: ORAL_TABLET | ORAL | 3 refills | Status: DC

## 2016-05-01 DIAGNOSIS — E1151 Type 2 diabetes mellitus with diabetic peripheral angiopathy without gangrene: Secondary | ICD-10-CM | POA: Diagnosis not present

## 2016-05-01 DIAGNOSIS — I739 Peripheral vascular disease, unspecified: Secondary | ICD-10-CM | POA: Diagnosis not present

## 2016-05-01 DIAGNOSIS — L84 Corns and callosities: Secondary | ICD-10-CM | POA: Diagnosis not present

## 2016-05-01 DIAGNOSIS — L603 Nail dystrophy: Secondary | ICD-10-CM | POA: Diagnosis not present

## 2016-05-04 DIAGNOSIS — Z79891 Long term (current) use of opiate analgesic: Secondary | ICD-10-CM | POA: Diagnosis not present

## 2016-05-04 DIAGNOSIS — M5136 Other intervertebral disc degeneration, lumbar region: Secondary | ICD-10-CM | POA: Diagnosis not present

## 2016-05-04 DIAGNOSIS — G8929 Other chronic pain: Secondary | ICD-10-CM | POA: Diagnosis not present

## 2016-05-04 DIAGNOSIS — M47816 Spondylosis without myelopathy or radiculopathy, lumbar region: Secondary | ICD-10-CM | POA: Diagnosis not present

## 2016-05-17 DIAGNOSIS — J449 Chronic obstructive pulmonary disease, unspecified: Secondary | ICD-10-CM | POA: Diagnosis not present

## 2016-05-17 DIAGNOSIS — Z90710 Acquired absence of both cervix and uterus: Secondary | ICD-10-CM | POA: Diagnosis not present

## 2016-05-17 DIAGNOSIS — I10 Essential (primary) hypertension: Secondary | ICD-10-CM | POA: Diagnosis not present

## 2016-05-17 DIAGNOSIS — E079 Disorder of thyroid, unspecified: Secondary | ICD-10-CM | POA: Diagnosis not present

## 2016-05-17 DIAGNOSIS — K219 Gastro-esophageal reflux disease without esophagitis: Secondary | ICD-10-CM | POA: Diagnosis not present

## 2016-05-17 DIAGNOSIS — Z9889 Other specified postprocedural states: Secondary | ICD-10-CM | POA: Diagnosis not present

## 2016-05-17 DIAGNOSIS — R87619 Unspecified abnormal cytological findings in specimens from cervix uteri: Secondary | ICD-10-CM | POA: Diagnosis not present

## 2016-05-17 DIAGNOSIS — Z87891 Personal history of nicotine dependence: Secondary | ICD-10-CM | POA: Diagnosis not present

## 2016-05-17 DIAGNOSIS — L298 Other pruritus: Secondary | ICD-10-CM | POA: Diagnosis not present

## 2016-05-17 DIAGNOSIS — Z9079 Acquired absence of other genital organ(s): Secondary | ICD-10-CM | POA: Diagnosis not present

## 2016-05-17 DIAGNOSIS — Z87412 Personal history of vulvar dysplasia: Secondary | ICD-10-CM | POA: Diagnosis not present

## 2016-05-17 DIAGNOSIS — N903 Dysplasia of vulva, unspecified: Secondary | ICD-10-CM | POA: Diagnosis not present

## 2016-05-26 ENCOUNTER — Other Ambulatory Visit: Payer: Self-pay | Admitting: *Deleted

## 2016-05-26 DIAGNOSIS — R413 Other amnesia: Secondary | ICD-10-CM

## 2016-05-26 MED ORDER — DONEPEZIL HCL 10 MG PO TABS: ORAL_TABLET | ORAL | 1 refills | Status: DC

## 2016-06-01 IMAGING — MR MR LUMBAR SPINE W/O CM
4 of 5 series · 27 of 48 positions shown · non-contrast
Comparison: Plain films lumbar spine 03/16/2014.

CLINICAL DATA: Multiple falls since February 2014. Low back and
buttock pain and weakness. Left leg pain. Symptoms are chronic but
worsened since February 2014.

EXAM:
MRI LUMBAR SPINE WITHOUT CONTRAST
TECHNIQUE: Multiplanar, multisequence MR imaging of the lumbar spine was
performed. No intravenous contrast was administered.

[Series 3: T2 · sagittal · 4.0mm · 0.81mm/px · 6 of 15 slices shown (1 of 2)]
[im 1/15]
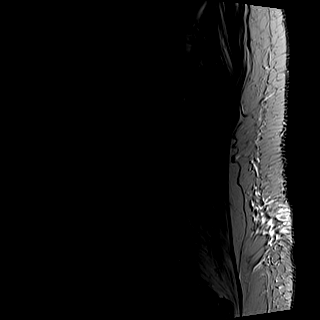
[im 3/15]
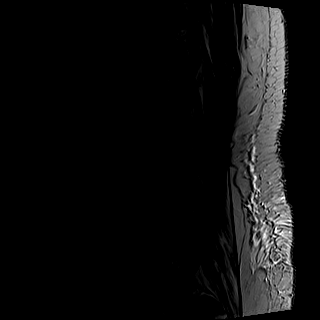
[im 6/15]
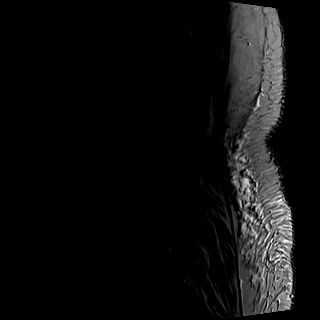
[im 9/15]
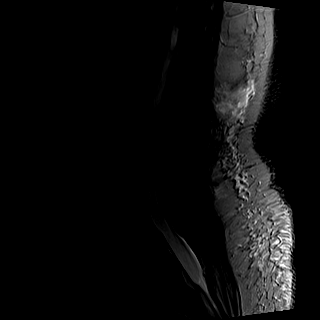
[im 12/15]
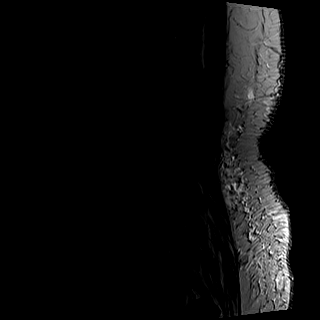
[im 15/15]
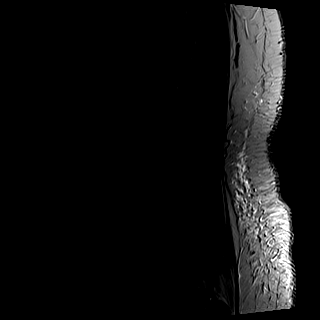

[Series 4: T1 · sagittal · 4.0mm · 0.41mm/px · 5 of 15 slices shown (1 of 2)]
[im 1/15]
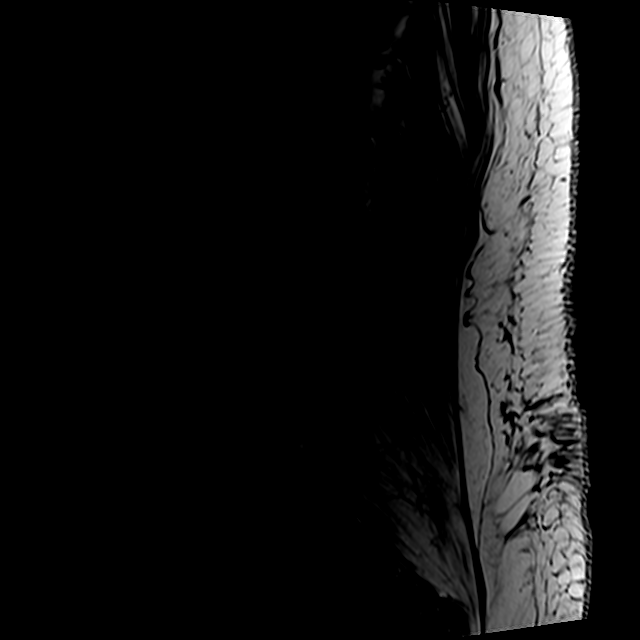
[im 4/15]
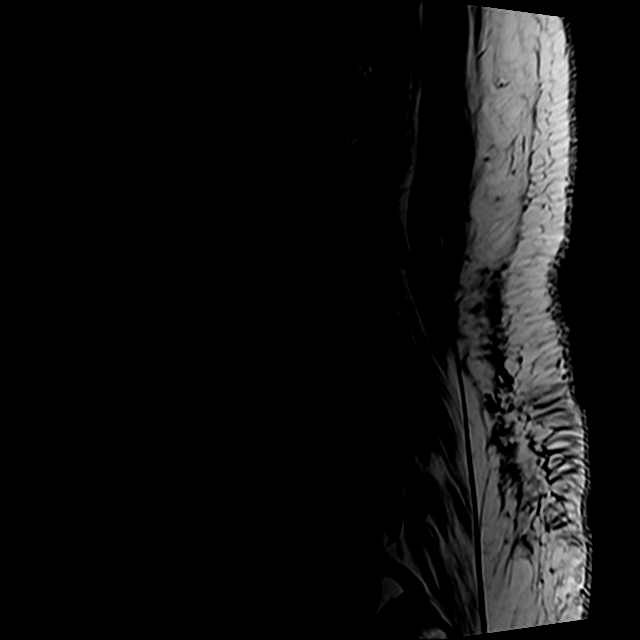
[im 8/15]
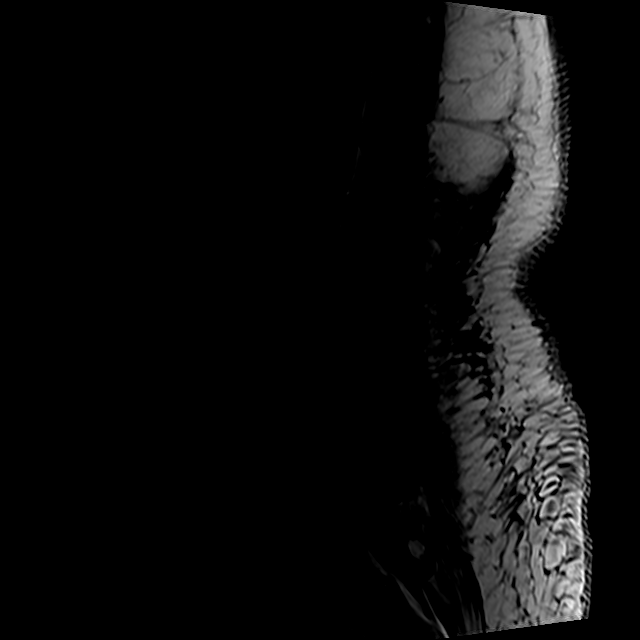
[im 11/15]
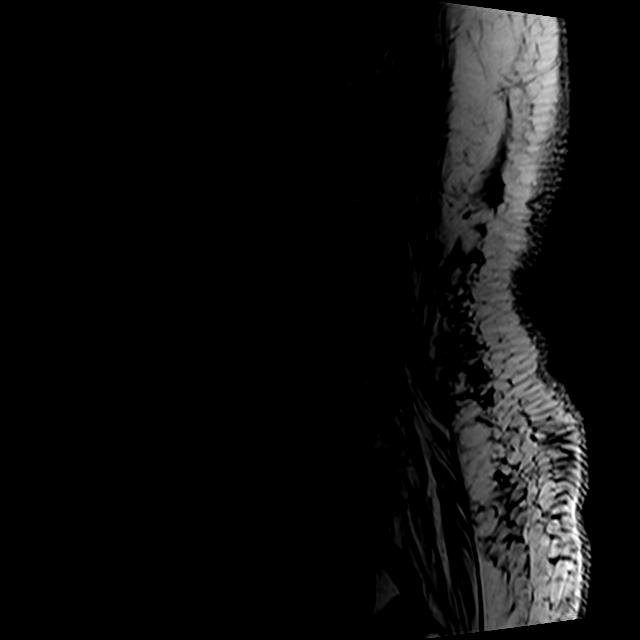
[im 15/15]
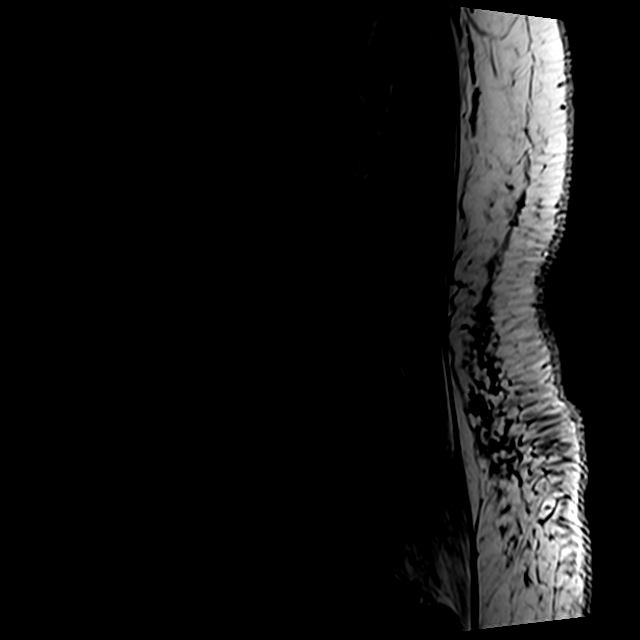

[Series 6: T2 · axial · 4.0mm · 0.78mm/px · z∈[+11,+261]mm · 10 of 43 slices shown (2 of 2)]
[im 3/43]
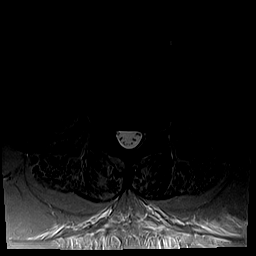
[im 6/43]
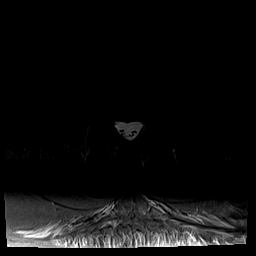
[im 9/43]
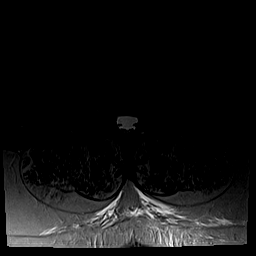
[im 15/43]
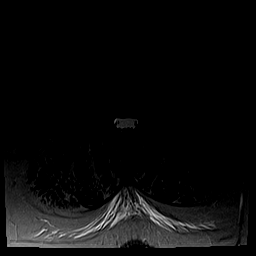
[im 20/43]
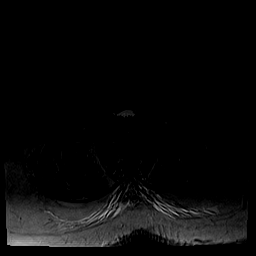
[im 23/43]
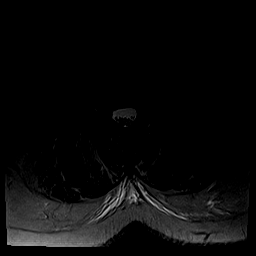
[im 26/43]
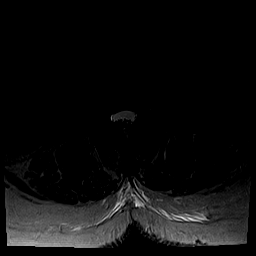
[im 31/43]
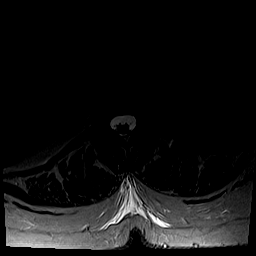
[im 37/43]
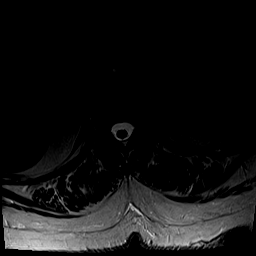
[im 43/43]
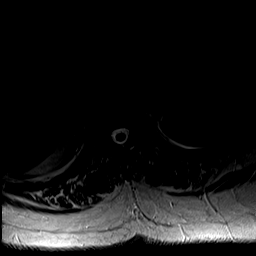

[Series 7: T1 · axial · 4.0mm · 0.39mm/px · z∈[+11,+232]mm · 6 of 43 slices shown (2 of 2)]
[im 3/43]
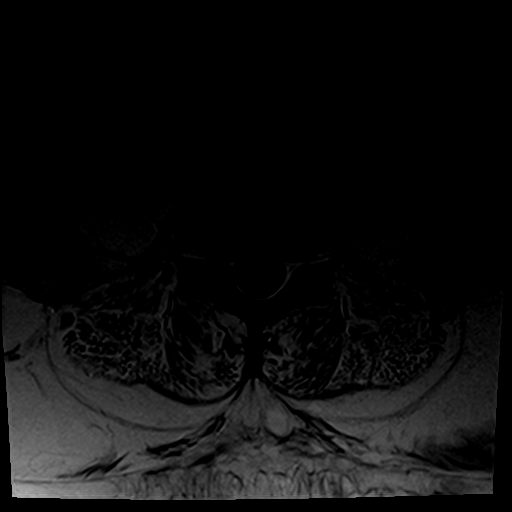
[im 6/43]
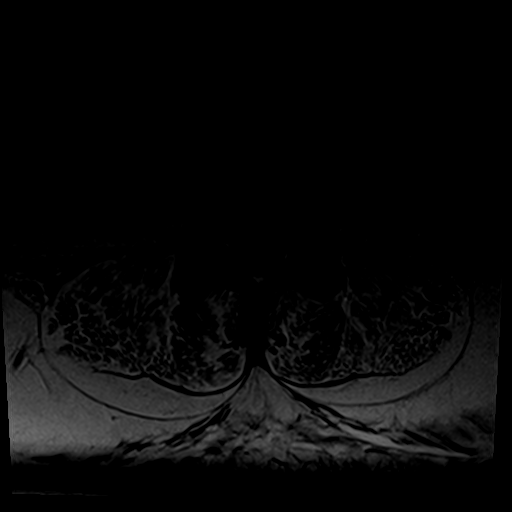
[im 9/43]
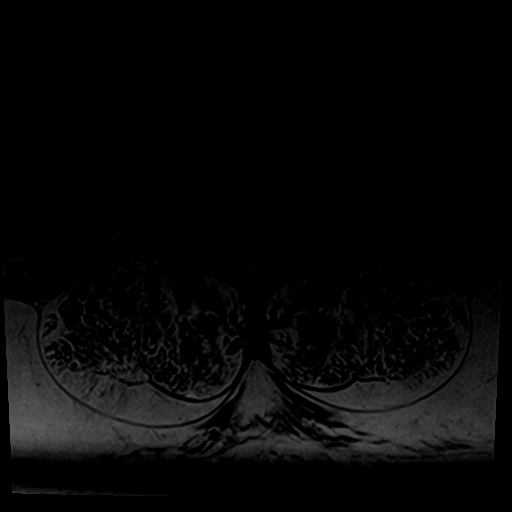
[im 15/43]
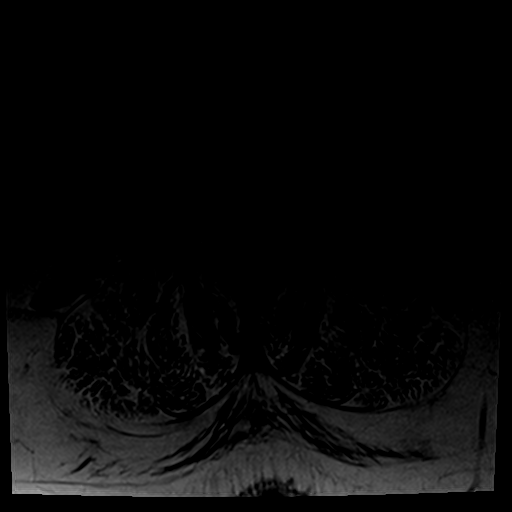
[im 23/43]
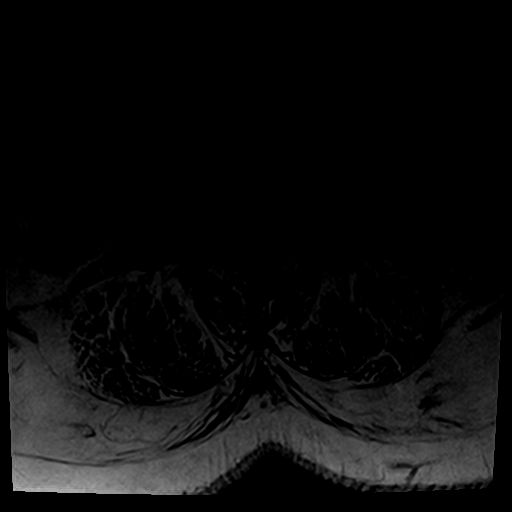
[im 37/43]
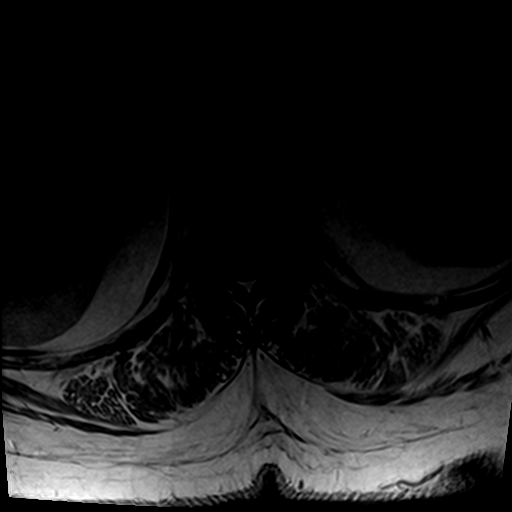

[27 of 48 positions shown; findings below may reference images not displayed]

FINDINGS: The L5-S1 disc interspaces autologously fused. Vertebral body height
and signal are maintained. Trace anterolisthesis L4 on L5 due to
facet arthropathy is noted. The conus medullaris is normal in signal
and position. Imaged intra-abdominal contents are unremarkable.

The T10-11 level is imaged in the sagittal plane only. There is a
shallow appearing central protrusion but the central canal and
foramina appear open.

T11-12: Minimal disc bulge and mild facet degenerative change
without central canal or foraminal narrowing.

T12-L1:  Negative.

L1-2:  Negative.

L2-3: The patient has a shallow disc bulge with a superimposed small
right lateral recess protrusion. There is mild encroachment on the
descending right L3 root without nerve root compression. The thecal
sac and foramina are open.

L3-4: Shallow disc bulge without central canal or foraminal
stenosis.

L4-5: Bilateral facet degenerative change is seen. The disc is
uncovered without bulging. The central canal and foramina are open.

L5-S1: Autologously fused. The central canal and foramina are open.
IMPRESSION: Shallow right lateral recess protrusion at L2-3 causes mild
narrowing in the lateral recesses without compression or deflection
of the descending right L3 root.

Shallow disc bulge L3-4 without central canal foraminal narrowing.

Facet arthropathy L4-5 results in trace anterolisthesis.

The L5-S1 level is autologous fused. The central canal and foramina
are open.

## 2016-06-07 DIAGNOSIS — Z1231 Encounter for screening mammogram for malignant neoplasm of breast: Secondary | ICD-10-CM | POA: Diagnosis not present

## 2016-06-09 ENCOUNTER — Other Ambulatory Visit: Payer: Self-pay | Admitting: Physician Assistant

## 2016-06-09 DIAGNOSIS — E7849 Other hyperlipidemia: Secondary | ICD-10-CM

## 2016-07-04 ENCOUNTER — Other Ambulatory Visit: Payer: Self-pay | Admitting: Physician Assistant

## 2016-07-04 ENCOUNTER — Other Ambulatory Visit: Payer: Self-pay | Admitting: Family Medicine

## 2016-07-04 DIAGNOSIS — E7849 Other hyperlipidemia: Secondary | ICD-10-CM

## 2016-07-17 ENCOUNTER — Other Ambulatory Visit: Payer: Self-pay | Admitting: Physician Assistant

## 2016-07-20 ENCOUNTER — Other Ambulatory Visit: Payer: Self-pay | Admitting: Physician Assistant

## 2016-07-25 ENCOUNTER — Ambulatory Visit: Payer: Medicare Other | Admitting: Physician Assistant

## 2016-07-25 ENCOUNTER — Telehealth: Payer: Self-pay | Admitting: Physician Assistant

## 2016-07-25 DIAGNOSIS — E7849 Other hyperlipidemia: Secondary | ICD-10-CM

## 2016-07-26 ENCOUNTER — Encounter: Payer: Self-pay | Admitting: Physician Assistant

## 2016-07-26 ENCOUNTER — Ambulatory Visit (INDEPENDENT_AMBULATORY_CARE_PROVIDER_SITE_OTHER): Payer: Medicare Other | Admitting: Physician Assistant

## 2016-07-26 VITALS — BP 116/66 | HR 79 | Ht 65.0 in | Wt 264.0 lb

## 2016-07-26 DIAGNOSIS — I700 Atherosclerosis of aorta: Secondary | ICD-10-CM

## 2016-07-26 DIAGNOSIS — M5136 Other intervertebral disc degeneration, lumbar region: Secondary | ICD-10-CM

## 2016-07-26 DIAGNOSIS — E784 Other hyperlipidemia: Secondary | ICD-10-CM | POA: Diagnosis not present

## 2016-07-26 DIAGNOSIS — R413 Other amnesia: Secondary | ICD-10-CM | POA: Diagnosis not present

## 2016-07-26 DIAGNOSIS — E7849 Other hyperlipidemia: Secondary | ICD-10-CM

## 2016-07-26 DIAGNOSIS — Z1211 Encounter for screening for malignant neoplasm of colon: Secondary | ICD-10-CM

## 2016-07-26 DIAGNOSIS — J42 Unspecified chronic bronchitis: Secondary | ICD-10-CM

## 2016-07-26 DIAGNOSIS — Z79899 Other long term (current) drug therapy: Secondary | ICD-10-CM | POA: Diagnosis not present

## 2016-07-26 DIAGNOSIS — F418 Other specified anxiety disorders: Secondary | ICD-10-CM

## 2016-07-26 DIAGNOSIS — M170 Bilateral primary osteoarthritis of knee: Secondary | ICD-10-CM

## 2016-07-26 DIAGNOSIS — E079 Disorder of thyroid, unspecified: Secondary | ICD-10-CM | POA: Diagnosis not present

## 2016-07-26 DIAGNOSIS — E119 Type 2 diabetes mellitus without complications: Secondary | ICD-10-CM | POA: Diagnosis not present

## 2016-07-26 DIAGNOSIS — F32A Depression, unspecified: Secondary | ICD-10-CM

## 2016-07-26 DIAGNOSIS — M47816 Spondylosis without myelopathy or radiculopathy, lumbar region: Secondary | ICD-10-CM

## 2016-07-26 DIAGNOSIS — F329 Major depressive disorder, single episode, unspecified: Secondary | ICD-10-CM

## 2016-07-26 DIAGNOSIS — F419 Anxiety disorder, unspecified: Secondary | ICD-10-CM

## 2016-07-26 LAB — POCT GLYCOSYLATED HEMOGLOBIN (HGB A1C): HEMOGLOBIN A1C: 6.2

## 2016-07-26 MED ORDER — DONEPEZIL HCL 10 MG PO TABS: ORAL_TABLET | ORAL | 1 refills | Status: DC

## 2016-07-26 MED ORDER — CITALOPRAM HYDROBROMIDE 40 MG PO TABS: 40.0000 mg | ORAL_TABLET | Freq: Every day | ORAL | 5 refills | Status: DC

## 2016-07-26 MED ORDER — BUDESONIDE-FORMOTEROL FUMARATE 160-4.5 MCG/ACT IN AERO: 2.0000 | INHALATION_SPRAY | Freq: Two times a day (BID) | RESPIRATORY_TRACT | 11 refills | Status: DC

## 2016-07-26 MED ORDER — BUPROPION HCL ER (XL) 150 MG PO TB24: ORAL_TABLET | ORAL | 5 refills | Status: DC

## 2016-07-26 MED ORDER — TIOTROPIUM BROMIDE MONOHYDRATE 18 MCG IN CAPS: ORAL_CAPSULE | RESPIRATORY_TRACT | 11 refills | Status: DC

## 2016-07-26 MED ORDER — LIRAGLUTIDE 18 MG/3ML ~~LOC~~ SOPN: PEN_INJECTOR | SUBCUTANEOUS | 5 refills | Status: DC

## 2016-07-26 MED ORDER — ATORVASTATIN CALCIUM 20 MG PO TABS
20.0000 mg | ORAL_TABLET | Freq: Every day | ORAL | 11 refills | Status: DC
Start: 2016-07-26 — End: 2017-06-01

## 2016-07-26 MED ORDER — GABAPENTIN 300 MG PO CAPS: ORAL_CAPSULE | ORAL | 5 refills | Status: DC

## 2016-07-26 MED ORDER — OLANZAPINE 5 MG PO TABS: 5.0000 mg | ORAL_TABLET | Freq: Every day | ORAL | 1 refills | Status: DC

## 2016-07-26 NOTE — Progress Notes (Signed)
Subjective:    Patient ID: Sarah Wright, female    DOB: [birth year], 71 y.o.   MRN: [id number]  HPI Pt is a 71 yo female who presents to the clinic for medication refill and follow up.   DM- doing great. Not checking sugars. Only taking victoza. Denies any hypoglycemia. No open sores or wounds.   Thyroid disease and left thyroid removed. Last u/s was 06/2015. She has had some issues with feeling like she is choking. No certain liquid or solid causes it. No problems breathing.   No problems with other medications.   Review of Systems    see HPI.  Objective:   Physical Exam  Constitutional: She is oriented to person, place, and time. She appears well-developed and well-nourished.  Morbid obesity.   HENT:  Head: Normocephalic and atraumatic.  Cardiovascular: Normal rate, regular rhythm and normal heart sounds.   Pulmonary/Chest: Effort normal and breath sounds normal.  Abdominal: Soft. Bowel sounds are normal. She exhibits no distension and no mass. There is no tenderness. There is no rebound and no guarding.  Neurological: She is alert and oriented to person, place, and time.  Psychiatric: She has a normal mood and affect. Her behavior is normal.          Assessment & Plan:  [name] KitchenMarland KitchenLuanne was seen today for diabetes.  Diagnoses and all orders for this visit:  Type 2 diabetes mellitus without complication, without long-term current use of insulin (HCC) -     POCT HgB A1C -     liraglutide (VICTOZA) 18 MG/3ML SOPN; 1.8 MG DAILY. Please include needles. -     COMPLETE METABOLIC PANEL WITH GFR  Anxiety and depression -     OLANZapine (ZYPREXA) 5 MG tablet; Take 1 tablet (5 mg total) by mouth at bedtime. -     citalopram (CELEXA) 40 MG tablet; Take 1 tablet (40 mg total) by mouth daily. -     buPROPion (WELLBUTRIN XL) 150 MG 24 hr tablet; TAKE 2 TABLETS BY MOUTH EVERY DAY IN THE MORNING  Lumbar spondylosis -     gabapentin (NEURONTIN) 300 MG capsule; TAKE 1 CAP AT BEDTIME X1  WK,TWICE DAILY X1 WK, THEN 3 TIMES DAILY. MAY DOUBLE WEEKLY TO 3600MG /DAY  DDD (degenerative disc disease), lumbar -     gabapentin (NEURONTIN) 300 MG capsule; TAKE 1 CAP AT BEDTIME X1 WK,TWICE DAILY X1 WK, THEN 3 TIMES DAILY. MAY DOUBLE WEEKLY TO 3600MG /DAY  Primary osteoarthritis of both knees -     gabapentin (NEURONTIN) 300 MG capsule; TAKE 1 CAP AT BEDTIME X1 WK,TWICE DAILY X1 WK, THEN 3 TIMES DAILY. MAY DOUBLE WEEKLY TO 3600MG /DAY  Other hyperlipidemia -     atorvastatin (LIPITOR) 20 MG tablet; Take 1 tablet (20 mg total) by mouth daily. -     Lipid panel  Memory loss -     donepezil (ARICEPT) 10 MG tablet; TAKE ONE TABLET (5 MG TOTAL) BY MOUTH AT BEDTIME. TO HELP PRESERVE MEMORY.  Aortic atherosclerosis (HCC) -     Lipid panel  Thyroid disease -     TSH -     T4, free -     US SOFT TISSUE HEAD AND NECK; Future  Medication management -     COMPLETE METABOLIC PANEL WITH GFR  Chronic bronchitis, unspecified chronic bronchitis type (HCC) -     budesonide-formoterol (SYMBICORT) 160-4.5 MCG/ACT inhaler; Inhale 2 puffs into the lungs 2 (two) times daily.  Colon cancer screening -     Ambulatory  referral to Gastroenterology  Other orders -     tiotropium (SPIRIVA HANDIHALER) 18 MCG inhalation capsule; INHALE ONE CAPSULE VIA HANDIHALER ONE TIME DAILY  .[name] Depression screen PHQ 2/9 07/26/2016  Decreased Interest 2  Down, Depressed, Hopeless 3  PHQ - 2 Score 5  Altered sleeping 2  Tired, decreased energy 3  Change in appetite 3  Feeling bad or failure about yourself  3  Trouble concentrating 2  Moving slowly or fidgety/restless 2  Suicidal thoughts 0  PHQ-9 Score 20    .[name] Lab Results  Component Value Date   HGBA1C 6.2 07/26/2016   A!C is great.  Will get a copy of DM eye exam.  Continue with victoza.  Follow up in 3 months.   Depression is not stable. She admits to a lot of health issues in her family keeping her down. She does not want any medication changes  today. Follow up as needed. Encouraged counseling. Pt declined today. No sucidal or homicidal thoughts.

## 2016-07-27 DIAGNOSIS — M47816 Spondylosis without myelopathy or radiculopathy, lumbar region: Secondary | ICD-10-CM | POA: Diagnosis not present

## 2016-07-27 DIAGNOSIS — E784 Other hyperlipidemia: Secondary | ICD-10-CM | POA: Diagnosis not present

## 2016-07-27 DIAGNOSIS — E119 Type 2 diabetes mellitus without complications: Secondary | ICD-10-CM | POA: Diagnosis not present

## 2016-07-27 DIAGNOSIS — I700 Atherosclerosis of aorta: Secondary | ICD-10-CM | POA: Diagnosis not present

## 2016-07-27 DIAGNOSIS — Z79899 Other long term (current) drug therapy: Secondary | ICD-10-CM | POA: Diagnosis not present

## 2016-07-27 DIAGNOSIS — G8929 Other chronic pain: Secondary | ICD-10-CM | POA: Diagnosis not present

## 2016-07-27 DIAGNOSIS — E079 Disorder of thyroid, unspecified: Secondary | ICD-10-CM | POA: Diagnosis not present

## 2016-07-27 LAB — COMPLETE METABOLIC PANEL WITH GFR
ALBUMIN: 3.8 g/dL (ref 3.6–5.1)
ALK PHOS: 123 U/L (ref 33–130)
ALT: 13 U/L (ref 6–29)
AST: 15 U/L (ref 10–35)
BUN: 17 mg/dL (ref 7–25)
CO2: 27 mmol/L (ref 20–31)
Calcium: 9 mg/dL (ref 8.6–10.4)
Chloride: 103 mmol/L (ref 98–110)
Creat: 0.87 mg/dL (ref 0.60–0.93)
GFR, Est African American: 77 mL/min (ref >=60)
GFR, Est Non African American: 67 mL/min (ref >=60)
GLUCOSE: 110 mg/dL — AB (ref 65–99)
POTASSIUM: 4.6 mmol/L (ref 3.5–5.3)
SODIUM: 139 mmol/L (ref 135–146)
Total Bilirubin: 0.3 mg/dL (ref 0.2–1.2)
Total Protein: 6.5 g/dL (ref 6.1–8.1)

## 2016-07-27 LAB — TSH: TSH: 1.78 m[IU]/L

## 2016-07-27 LAB — LIPID PANEL
Cholesterol: 122 mg/dL (ref <200)
HDL: 41 mg/dL — ABNORMAL LOW (ref >50)
LDL CALC: 54 mg/dL (ref <100)
TRIGLYCERIDES: 137 mg/dL (ref <150)
Total CHOL/HDL Ratio: 3 Ratio (ref <5.0)
VLDL: 27 mg/dL (ref <30)

## 2016-07-27 LAB — T4, FREE: Free T4: 1.1 ng/dL (ref 0.8–1.8)

## 2016-07-28 NOTE — Telephone Encounter (Signed)
[facility]eds [facility] [place]

## 2016-08-03 ENCOUNTER — Other Ambulatory Visit: Payer: Medicare Other

## 2016-08-03 NOTE — Telephone Encounter (Signed)
Done

## 2016-08-09 ENCOUNTER — Other Ambulatory Visit: Payer: Self-pay | Admitting: Sports Medicine

## 2016-08-11 ENCOUNTER — Encounter: Payer: Self-pay | Admitting: Physician Assistant

## 2016-08-11 ENCOUNTER — Ambulatory Visit (INDEPENDENT_AMBULATORY_CARE_PROVIDER_SITE_OTHER): Payer: Medicare Other

## 2016-08-11 ENCOUNTER — Ambulatory Visit (INDEPENDENT_AMBULATORY_CARE_PROVIDER_SITE_OTHER): Payer: Medicare Other | Admitting: Physician Assistant

## 2016-08-11 DIAGNOSIS — M25512 Pain in left shoulder: Secondary | ICD-10-CM

## 2016-08-11 DIAGNOSIS — S4992XA Unspecified injury of left shoulder and upper arm, initial encounter: Secondary | ICD-10-CM | POA: Diagnosis not present

## 2016-08-11 DIAGNOSIS — R2689 Other abnormalities of gait and mobility: Secondary | ICD-10-CM | POA: Diagnosis not present

## 2016-08-11 DIAGNOSIS — J42 Unspecified chronic bronchitis: Secondary | ICD-10-CM

## 2016-08-11 MED ORDER — LEVOCETIRIZINE DIHYDROCHLORIDE 5 MG PO TABS: 5.0000 mg | ORAL_TABLET | Freq: Every evening | ORAL | 5 refills | Status: DC

## 2016-08-11 MED ORDER — ALBUTEROL SULFATE HFA 108 (90 BASE) MCG/ACT IN AERS: 2.0000 | INHALATION_SPRAY | Freq: Four times a day (QID) | RESPIRATORY_TRACT | 2 refills | Status: DC | PRN

## 2016-08-11 MED ORDER — MELOXICAM 15 MG PO TABS: 15.0000 mg | ORAL_TABLET | Freq: Every day | ORAL | 0 refills | Status: DC

## 2016-08-11 MED ORDER — PHENTERMINE HCL 37.5 MG PO TABS: 37.5000 mg | ORAL_TABLET | Freq: Every day | ORAL | 0 refills | Status: DC

## 2016-08-11 MED ORDER — LORCASERIN HCL 10 MG PO TABS: 1.0000 | ORAL_TABLET | Freq: Two times a day (BID) | ORAL | 1 refills | Status: DC

## 2016-08-11 NOTE — Progress Notes (Signed)
   Subjective:    Patient ID: Sarah Wright, female    DOB: 1946/04/23, 71 y.o.   MRN: [id number]  HPI  Pt's main concern today is weight. She wants to lose weight. She has to walk with a cane due to joint pain and instability so exercising is hard. She admits her diet is terrible. She know what she needs to do but has no motivation to do it. She is on victoza for DM and wellbutrin for mood.    She has had increasing unstable feeling walking. She tripped last week and landed on left shoulder. Her shoulder still hurts. She has not been evaluated. She walks with a cane every where she goes. She has been feeling increasingly more unbalanced.   She has been wheezing a little more than usual. She is on singulair daily. She does not have a rescue inhaler. She does take spirvia and symbicort daily.      Review of Systems  All other systems reviewed and are negative.      Objective:   Physical Exam  Constitutional: She is oriented to person, place, and time. She appears well-developed and well-nourished.  Morbidly obese.  HENT:  Head: Normocephalic and atraumatic.  Cardiovascular: Normal rate, regular rhythm and normal heart sounds.   Pulmonary/Chest: Effort normal and breath sounds normal.  Musculoskeletal:  NROM of left shoulder.  Tenderness to palpation anterior and posterior shoulder.  Strength slightly decreased on the left.  Hand grip 5/5.   Neurological: She is alert and oriented to person, place, and time.  Psychiatric: She has a normal mood and affect. Her behavior is normal.          Assessment & Plan:  [name] KitchenMarland KitchenLuanne was seen today for obesity.  Diagnoses and all orders for this visit:  Morbid obesity ([place]) -     phentermine (ADIPEX-P) 37.5 MG tablet; Take 1 tablet (37.5 mg total) by mouth daily before breakfast. -     Lorcaserin HCl 10 MG TABS; Take 1 tablet by mouth 2 (two) times daily.  Left shoulder pain, unspecified chronicity -     meloxicam (MOBIC) 15 MG tablet;  Take 1 tablet (15 mg total) by mouth daily. -     DG Shoulder Left; Future  Chronic bronchitis, unspecified chronic bronchitis type (HCC) -     levocetirizine (XYZAL) 5 MG tablet; Take 1 tablet (5 mg total) by mouth every evening. -     albuterol (PROVENTIL HFA;VENTOLIN HFA) 108 (90 Base) MCG/ACT inhaler; Inhale 2 puffs into the lungs every 6 (six) hours as needed for wheezing or shortness of breath.  Balance problem -     Ambulatory referral to [facility]   Discussed side effects of phentermine. Try to get belviq filled first. Concerned about cost. Follow up in 1 month. Discussed diet and calorie counting.   For balance made PT referral. Will get xray of left shoulder. ROM encouraging like just a contusion/sprain. Ice/NSAID.   Added xyzal and rescue inhaler. Follow up as needed.

## 2016-08-11 NOTE — Progress Notes (Signed)
Call pt: no acute fracture or dislocation.

## 2016-08-11 NOTE — Patient Instructions (Signed)
Bursitis Bursitis is when the fluid-filled sac (bursa) that covers and protects a joint is swollen (inflamed). Bursitis is most common near joints, especially the knees, elbows, hips, and shoulders. Follow these instructions at home:  Take medicines only as told by your doctor.  If you were prescribed an antibiotic medicine, finish it all even if you start to feel better.  Rest the affected area as told by your doctor.  Keep the area raised up.  Avoid doing things that make the pain worse.  Apply ice to the injured area:  Place ice in a plastic bag.  Place a towel between your skin and the bag.  Leave the ice on for 20 minutes, 2-3 times a day.  Use splints, braces, pads, or walking aids as told by your doctor.  Keep all follow-up visits as told by your doctor. This is important. Contact a doctor if:  You have more pain with home care.  You have a fever.  You have chills. This information is not intended to replace advice given to you by your health care provider. Make sure you discuss any questions you have with your health care provider. Document Released: 09/28/2009 Document Revised: 09/16/2015 Document Reviewed: 06/30/2013 Elsevier Interactive Patient Education  2017 [employer]  

## 2016-08-13 ENCOUNTER — Encounter: Payer: Self-pay | Admitting: Physician Assistant

## 2016-08-13 DIAGNOSIS — R2689 Other abnormalities of gait and mobility: Secondary | ICD-10-CM | POA: Insufficient documentation

## 2016-08-13 DIAGNOSIS — M25512 Pain in left shoulder: Secondary | ICD-10-CM | POA: Insufficient documentation

## 2016-08-15 DIAGNOSIS — E785 Hyperlipidemia, unspecified: Secondary | ICD-10-CM | POA: Diagnosis not present

## 2016-08-15 DIAGNOSIS — Z7982 Long term (current) use of aspirin: Secondary | ICD-10-CM | POA: Diagnosis not present

## 2016-08-15 DIAGNOSIS — M5136 Other intervertebral disc degeneration, lumbar region: Secondary | ICD-10-CM | POA: Diagnosis not present

## 2016-08-15 DIAGNOSIS — I872 Venous insufficiency (chronic) (peripheral): Secondary | ICD-10-CM | POA: Diagnosis not present

## 2016-08-15 DIAGNOSIS — Z9181 History of falling: Secondary | ICD-10-CM | POA: Diagnosis not present

## 2016-08-15 DIAGNOSIS — F419 Anxiety disorder, unspecified: Secondary | ICD-10-CM | POA: Diagnosis not present

## 2016-08-15 DIAGNOSIS — M170 Bilateral primary osteoarthritis of knee: Secondary | ICD-10-CM | POA: Diagnosis not present

## 2016-08-15 DIAGNOSIS — M6281 Muscle weakness (generalized): Secondary | ICD-10-CM | POA: Diagnosis not present

## 2016-08-15 DIAGNOSIS — M25512 Pain in left shoulder: Secondary | ICD-10-CM | POA: Diagnosis not present

## 2016-08-15 DIAGNOSIS — Z87891 Personal history of nicotine dependence: Secondary | ICD-10-CM | POA: Diagnosis not present

## 2016-08-15 DIAGNOSIS — Z7951 Long term (current) use of inhaled steroids: Secondary | ICD-10-CM | POA: Diagnosis not present

## 2016-08-15 DIAGNOSIS — J449 Chronic obstructive pulmonary disease, unspecified: Secondary | ICD-10-CM | POA: Diagnosis not present

## 2016-08-15 DIAGNOSIS — E119 Type 2 diabetes mellitus without complications: Secondary | ICD-10-CM | POA: Diagnosis not present

## 2016-08-15 DIAGNOSIS — M47816 Spondylosis without myelopathy or radiculopathy, lumbar region: Secondary | ICD-10-CM | POA: Diagnosis not present

## 2016-08-15 DIAGNOSIS — F329 Major depressive disorder, single episode, unspecified: Secondary | ICD-10-CM | POA: Diagnosis not present

## 2016-08-15 DIAGNOSIS — Z794 Long term (current) use of insulin: Secondary | ICD-10-CM | POA: Diagnosis not present

## 2016-08-16 ENCOUNTER — Telehealth: Payer: Self-pay | Admitting: Physician Assistant

## 2016-08-16 NOTE — Telephone Encounter (Signed)
Received call from [facility] with [facility] PT (P: [phone]). Requested verbal for home health PT, 2 times a week for 6 weeks.  Verbal order given.

## 2016-08-19 DIAGNOSIS — M6281 Muscle weakness (generalized): Secondary | ICD-10-CM | POA: Diagnosis not present

## 2016-08-19 DIAGNOSIS — M5136 Other intervertebral disc degeneration, lumbar region: Secondary | ICD-10-CM | POA: Diagnosis not present

## 2016-08-19 DIAGNOSIS — M47816 Spondylosis without myelopathy or radiculopathy, lumbar region: Secondary | ICD-10-CM | POA: Diagnosis not present

## 2016-08-19 DIAGNOSIS — M25512 Pain in left shoulder: Secondary | ICD-10-CM | POA: Diagnosis not present

## 2016-08-19 DIAGNOSIS — M170 Bilateral primary osteoarthritis of knee: Secondary | ICD-10-CM | POA: Diagnosis not present

## 2016-08-19 DIAGNOSIS — E119 Type 2 diabetes mellitus without complications: Secondary | ICD-10-CM | POA: Diagnosis not present

## 2016-08-21 ENCOUNTER — Ambulatory Visit: Payer: Medicare Other | Admitting: Sports Medicine

## 2016-08-21 DIAGNOSIS — K219 Gastro-esophageal reflux disease without esophagitis: Secondary | ICD-10-CM | POA: Diagnosis not present

## 2016-08-21 DIAGNOSIS — M170 Bilateral primary osteoarthritis of knee: Secondary | ICD-10-CM | POA: Diagnosis not present

## 2016-08-21 DIAGNOSIS — R140 Abdominal distension (gaseous): Secondary | ICD-10-CM | POA: Diagnosis not present

## 2016-08-21 DIAGNOSIS — M6281 Muscle weakness (generalized): Secondary | ICD-10-CM | POA: Diagnosis not present

## 2016-08-21 DIAGNOSIS — M5136 Other intervertebral disc degeneration, lumbar region: Secondary | ICD-10-CM | POA: Diagnosis not present

## 2016-08-21 DIAGNOSIS — M47816 Spondylosis without myelopathy or radiculopathy, lumbar region: Secondary | ICD-10-CM | POA: Diagnosis not present

## 2016-08-21 DIAGNOSIS — M25512 Pain in left shoulder: Secondary | ICD-10-CM | POA: Diagnosis not present

## 2016-08-21 DIAGNOSIS — R1310 Dysphagia, unspecified: Secondary | ICD-10-CM | POA: Diagnosis not present

## 2016-08-21 DIAGNOSIS — R109 Unspecified abdominal pain: Secondary | ICD-10-CM | POA: Diagnosis not present

## 2016-08-21 DIAGNOSIS — E119 Type 2 diabetes mellitus without complications: Secondary | ICD-10-CM | POA: Diagnosis not present

## 2016-08-24 DIAGNOSIS — M170 Bilateral primary osteoarthritis of knee: Secondary | ICD-10-CM | POA: Diagnosis not present

## 2016-08-24 DIAGNOSIS — M47816 Spondylosis without myelopathy or radiculopathy, lumbar region: Secondary | ICD-10-CM | POA: Diagnosis not present

## 2016-08-24 DIAGNOSIS — E119 Type 2 diabetes mellitus without complications: Secondary | ICD-10-CM | POA: Diagnosis not present

## 2016-08-24 DIAGNOSIS — M5136 Other intervertebral disc degeneration, lumbar region: Secondary | ICD-10-CM | POA: Diagnosis not present

## 2016-08-24 DIAGNOSIS — M25512 Pain in left shoulder: Secondary | ICD-10-CM | POA: Diagnosis not present

## 2016-08-24 DIAGNOSIS — M6281 Muscle weakness (generalized): Secondary | ICD-10-CM | POA: Diagnosis not present

## 2016-08-25 DIAGNOSIS — M170 Bilateral primary osteoarthritis of knee: Secondary | ICD-10-CM | POA: Diagnosis not present

## 2016-08-25 DIAGNOSIS — M47816 Spondylosis without myelopathy or radiculopathy, lumbar region: Secondary | ICD-10-CM | POA: Diagnosis not present

## 2016-08-25 DIAGNOSIS — M5136 Other intervertebral disc degeneration, lumbar region: Secondary | ICD-10-CM | POA: Diagnosis not present

## 2016-08-25 DIAGNOSIS — M6281 Muscle weakness (generalized): Secondary | ICD-10-CM | POA: Diagnosis not present

## 2016-08-30 DIAGNOSIS — M47816 Spondylosis without myelopathy or radiculopathy, lumbar region: Secondary | ICD-10-CM | POA: Diagnosis not present

## 2016-08-30 DIAGNOSIS — M170 Bilateral primary osteoarthritis of knee: Secondary | ICD-10-CM | POA: Diagnosis not present

## 2016-08-30 DIAGNOSIS — M6281 Muscle weakness (generalized): Secondary | ICD-10-CM | POA: Diagnosis not present

## 2016-08-30 DIAGNOSIS — E119 Type 2 diabetes mellitus without complications: Secondary | ICD-10-CM | POA: Diagnosis not present

## 2016-08-30 DIAGNOSIS — M5136 Other intervertebral disc degeneration, lumbar region: Secondary | ICD-10-CM | POA: Diagnosis not present

## 2016-08-30 DIAGNOSIS — M25512 Pain in left shoulder: Secondary | ICD-10-CM | POA: Diagnosis not present

## 2016-09-06 DIAGNOSIS — M170 Bilateral primary osteoarthritis of knee: Secondary | ICD-10-CM | POA: Diagnosis not present

## 2016-09-06 DIAGNOSIS — M25512 Pain in left shoulder: Secondary | ICD-10-CM | POA: Diagnosis not present

## 2016-09-06 DIAGNOSIS — M5136 Other intervertebral disc degeneration, lumbar region: Secondary | ICD-10-CM | POA: Diagnosis not present

## 2016-09-06 DIAGNOSIS — E119 Type 2 diabetes mellitus without complications: Secondary | ICD-10-CM | POA: Diagnosis not present

## 2016-09-06 DIAGNOSIS — M47816 Spondylosis without myelopathy or radiculopathy, lumbar region: Secondary | ICD-10-CM | POA: Diagnosis not present

## 2016-09-06 DIAGNOSIS — M6281 Muscle weakness (generalized): Secondary | ICD-10-CM | POA: Diagnosis not present

## 2016-09-07 ENCOUNTER — Other Ambulatory Visit: Payer: Self-pay | Admitting: Physician Assistant

## 2016-09-07 DIAGNOSIS — M25512 Pain in left shoulder: Secondary | ICD-10-CM

## 2016-09-08 ENCOUNTER — Ambulatory Visit (INDEPENDENT_AMBULATORY_CARE_PROVIDER_SITE_OTHER): Payer: Medicare Other | Admitting: Physician Assistant

## 2016-09-08 ENCOUNTER — Encounter: Payer: Self-pay | Admitting: Physician Assistant

## 2016-09-08 DIAGNOSIS — R2689 Other abnormalities of gait and mobility: Secondary | ICD-10-CM

## 2016-09-08 DIAGNOSIS — E119 Type 2 diabetes mellitus without complications: Secondary | ICD-10-CM | POA: Diagnosis not present

## 2016-09-08 DIAGNOSIS — M47816 Spondylosis without myelopathy or radiculopathy, lumbar region: Secondary | ICD-10-CM | POA: Diagnosis not present

## 2016-09-08 DIAGNOSIS — M25512 Pain in left shoulder: Secondary | ICD-10-CM | POA: Diagnosis not present

## 2016-09-08 DIAGNOSIS — I700 Atherosclerosis of aorta: Secondary | ICD-10-CM | POA: Diagnosis not present

## 2016-09-08 DIAGNOSIS — R200 Anesthesia of skin: Secondary | ICD-10-CM | POA: Diagnosis not present

## 2016-09-08 DIAGNOSIS — M5136 Other intervertebral disc degeneration, lumbar region: Secondary | ICD-10-CM | POA: Diagnosis not present

## 2016-09-08 DIAGNOSIS — E7800 Pure hypercholesterolemia, unspecified: Secondary | ICD-10-CM | POA: Diagnosis not present

## 2016-09-08 DIAGNOSIS — M6281 Muscle weakness (generalized): Secondary | ICD-10-CM | POA: Diagnosis not present

## 2016-09-08 DIAGNOSIS — M170 Bilateral primary osteoarthritis of knee: Secondary | ICD-10-CM | POA: Diagnosis not present

## 2016-09-08 MED ORDER — PHENTERMINE HCL 37.5 MG PO TABS: 37.5000 mg | ORAL_TABLET | Freq: Every day | ORAL | 0 refills | Status: DC

## 2016-09-08 NOTE — Patient Instructions (Signed)
Transient Ischemic Attack °A transient ischemic attack (TIA) is a "warning stroke" that causes stroke-like symptoms. A TIA does not cause lasting damage to the brain. The symptoms of a TIA can happen fast and do not last long. It is important to know the symptoms of a TIA and what to do. This can help prevent stroke or death. °Follow these instructions at home: °· Take medicines only as told by your doctor. Make sure you understand all of the instructions. °· You may need to take aspirin or warfarin medicine. Warfarin needs to be taken exactly as told. °¨ Taking too much or too little warfarin is dangerous. Blood tests must be done as often as told by your doctor. A PT blood test measures how long it takes for blood to clot. Your PT is used to calculate another value called an INR. Your PT and INR help your doctor adjust your warfarin dosage. He or she will make sure you are taking the right amount. °¨ Food can cause problems with warfarin and affect the results of your blood tests. This is true for foods high in vitamin K. Eat the same amount of foods high in vitamin K each day. Foods high in vitamin K include spinach, kale, broccoli, cabbage, collard and turnip greens, Brussels sprouts, peas, cauliflower, seaweed, and parsley. Other foods high in vitamin K include beef and pork liver, green tea, and soybean oil. Eat the same amount of foods high in vitamin K each day. Avoid big changes in your diet. Tell your doctor before changing your diet. Talk to a food specialist (dietitian) if you have questions. °¨ Many medicines can cause problems with warfarin and affect your PT and INR. Tell your doctor about all medicines you take. This includes vitamins and dietary pills (supplements). Do not take or stop taking any prescribed or over-the-counter medicines unless your doctor tells you to. °¨ Warfarin can cause more bruising or bleeding. Hold pressure over any cuts for longer than normal. Talk to your doctor about other  side effects of warfarin. °¨ Avoid sports or activities that may cause injury or bleeding. °¨ Be careful when you shave, floss, or use sharp objects. °¨ Avoid or drink very little alcohol while taking warfarin. Tell your doctor if you change how much alcohol you drink. °¨ Tell your dentist and other doctors that you take warfarin before any procedures. °· Follow your diet program as told, if you are given one. °· Keep a healthy weight. °· Stay active. Try to get at least 30 minutes of activity on all or most days. °· Do not use any tobacco products, including cigarettes, chewing tobacco, or electronic cigarettes. If you need help quitting, ask your doctor. °· Limit alcohol intake to no more than 1 drink per day for nonpregnant women and 2 drinks per day for men. One drink equals 12 ounces of beer, 5 ounces of wine, or 1½ ounces of hard liquor. °· Do not abuse drugs. °· Keep your home safe so you do not fall. You can do this by: °¨ Putting grab bars in the bedroom and bathroom. °¨ Raising toilet seats. °¨ Putting a seat in the shower. °· Keep all follow-up visits as told by your doctor. This is important. °Contact a doctor if: °· Your personality changes. °· You have trouble swallowing. °· You have double vision. °· You are dizzy. °· You have a fever. °Get help right away if: °These symptoms may be an emergency. Do not wait to see if   the symptoms will go away. Get medical help right away. Call your local emergency services (911 in the [place] Do not drive yourself to the hospital. °· You have sudden weakness or lose feeling (go numb), especially on one side of the body. This can affect your: °¨ Face. °¨ Arm. °¨ Leg. °· You have sudden trouble walking. °· You have sudden trouble moving your arms or legs. °· You have sudden confusion. °· You have trouble talking. °· You have trouble understanding. °· You have sudden trouble seeing in one or both eyes. °· You lose your balance. °· Your movements are not smooth. °· You  have a sudden, very bad headache with no known cause. °· You have new chest pain. °· Your heartbeat is unsteady. °· You are partly or totally unaware of what is going on around you. °This information is not intended to replace advice given to you by your health care provider. Make sure you discuss any questions you have with your health care provider. °Document Released: 01/18/2008 Document Revised: 12/13/2015 Document Reviewed: 07/16/2013 °Elsevier Interactive Patient Education © 2017 Elsevier Inc. ° °

## 2016-09-08 NOTE — Progress Notes (Signed)
   Subjective:    Patient ID: Sarah Wright, female    DOB: 04-23-1946, 71 y.o.   MRN: [id number]  HPI  Pt is a 71 yo female who presents to the clinic to follow up on weight. She was not able to afford belviq and so we sent phentermine. She denies any palpitations, anxiety, insomnia, constipation worsening. She has lost 11lbs in one month. She has cut calories but she is not counting them. She is trying to stay moving. Due to inbalance issues and knee pain makes it hard. She has had some numbness around her mouth and tongue off and on with a one time pain behind left eye. She is concerned about "mini strokes". She has not had any limb weakness, speech issues, facial drooping. Pt had carotids done 05/2015 with no signifcant stenosis. She is on lipitor.     Review of Systems  All other systems reviewed and are negative.      Objective:   Physical Exam  Constitutional: She is oriented to person, place, and time. She appears well-developed and well-nourished.  obese  HENT:  Head: Normocephalic and atraumatic.  Neck: No JVD present. No tracheal deviation present.  Cardiovascular: Normal rate, regular rhythm and normal heart sounds.   No bruits.   Pulmonary/Chest: Effort normal and breath sounds normal.  Lymphadenopathy:    She has no cervical adenopathy.  Neurological: She is alert and oriented to person, place, and time.  Psychiatric: She has a normal mood and affect. Her behavior is normal.          Assessment & Plan:  [name] KitchenMarland KitchenLuanne was seen today for obesity.  Diagnoses and all orders for this visit:  Morbid obesity ([place])  Aortic atherosclerosis ([place]) -     US Carotid Duplex Bilateral; Future  Balance problem  Numbness around mouth  Pure hypercholesterolemia -     US Carotid Duplex Bilateral; Future  Other orders -     phentermine (ADIPEX-P) 37.5 MG tablet; Take 1 tablet (37.5 mg total) by mouth daily before breakfast.  follow up on phentermine in one month. Consider  cutting in half. I suspect new numbness around mouth could be phentermine side effect. Consider starting to count calories and stick to around 1500calories and keep moving even if it's extermity while seated in chair.   Will get carotid dopplers. I do not think this is mini strokes. Gave HO with symptoms of mini strokes. Follow up as needed. Make sure taking a baby asa 81mg  daily.   She is doing great with physical therapy in her home for balance issues.

## 2016-09-13 DIAGNOSIS — M5136 Other intervertebral disc degeneration, lumbar region: Secondary | ICD-10-CM | POA: Diagnosis not present

## 2016-09-13 DIAGNOSIS — M47816 Spondylosis without myelopathy or radiculopathy, lumbar region: Secondary | ICD-10-CM | POA: Diagnosis not present

## 2016-09-13 DIAGNOSIS — M6281 Muscle weakness (generalized): Secondary | ICD-10-CM | POA: Diagnosis not present

## 2016-09-13 DIAGNOSIS — M170 Bilateral primary osteoarthritis of knee: Secondary | ICD-10-CM | POA: Diagnosis not present

## 2016-09-13 DIAGNOSIS — M25512 Pain in left shoulder: Secondary | ICD-10-CM | POA: Diagnosis not present

## 2016-09-13 DIAGNOSIS — E119 Type 2 diabetes mellitus without complications: Secondary | ICD-10-CM | POA: Diagnosis not present

## 2016-09-14 DIAGNOSIS — M170 Bilateral primary osteoarthritis of knee: Secondary | ICD-10-CM | POA: Diagnosis not present

## 2016-09-14 DIAGNOSIS — M47816 Spondylosis without myelopathy or radiculopathy, lumbar region: Secondary | ICD-10-CM | POA: Diagnosis not present

## 2016-09-14 DIAGNOSIS — E119 Type 2 diabetes mellitus without complications: Secondary | ICD-10-CM | POA: Diagnosis not present

## 2016-09-14 DIAGNOSIS — M5136 Other intervertebral disc degeneration, lumbar region: Secondary | ICD-10-CM | POA: Diagnosis not present

## 2016-09-14 DIAGNOSIS — M6281 Muscle weakness (generalized): Secondary | ICD-10-CM | POA: Diagnosis not present

## 2016-09-14 DIAGNOSIS — M25512 Pain in left shoulder: Secondary | ICD-10-CM | POA: Diagnosis not present

## 2016-09-21 DIAGNOSIS — M5136 Other intervertebral disc degeneration, lumbar region: Secondary | ICD-10-CM | POA: Diagnosis not present

## 2016-09-21 DIAGNOSIS — M47816 Spondylosis without myelopathy or radiculopathy, lumbar region: Secondary | ICD-10-CM | POA: Diagnosis not present

## 2016-09-21 DIAGNOSIS — M170 Bilateral primary osteoarthritis of knee: Secondary | ICD-10-CM | POA: Diagnosis not present

## 2016-09-21 DIAGNOSIS — E119 Type 2 diabetes mellitus without complications: Secondary | ICD-10-CM | POA: Diagnosis not present

## 2016-09-21 DIAGNOSIS — M6281 Muscle weakness (generalized): Secondary | ICD-10-CM | POA: Diagnosis not present

## 2016-09-21 DIAGNOSIS — M25512 Pain in left shoulder: Secondary | ICD-10-CM | POA: Diagnosis not present

## 2016-09-22 DIAGNOSIS — M25512 Pain in left shoulder: Secondary | ICD-10-CM | POA: Diagnosis not present

## 2016-09-22 DIAGNOSIS — M6281 Muscle weakness (generalized): Secondary | ICD-10-CM | POA: Diagnosis not present

## 2016-09-22 DIAGNOSIS — M47816 Spondylosis without myelopathy or radiculopathy, lumbar region: Secondary | ICD-10-CM | POA: Diagnosis not present

## 2016-09-22 DIAGNOSIS — M5136 Other intervertebral disc degeneration, lumbar region: Secondary | ICD-10-CM | POA: Diagnosis not present

## 2016-09-22 DIAGNOSIS — M170 Bilateral primary osteoarthritis of knee: Secondary | ICD-10-CM | POA: Diagnosis not present

## 2016-09-22 DIAGNOSIS — E119 Type 2 diabetes mellitus without complications: Secondary | ICD-10-CM | POA: Diagnosis not present

## 2016-09-25 DIAGNOSIS — M25512 Pain in left shoulder: Secondary | ICD-10-CM | POA: Diagnosis not present

## 2016-09-25 DIAGNOSIS — M47816 Spondylosis without myelopathy or radiculopathy, lumbar region: Secondary | ICD-10-CM | POA: Diagnosis not present

## 2016-09-25 DIAGNOSIS — M5136 Other intervertebral disc degeneration, lumbar region: Secondary | ICD-10-CM | POA: Diagnosis not present

## 2016-09-25 DIAGNOSIS — M170 Bilateral primary osteoarthritis of knee: Secondary | ICD-10-CM | POA: Diagnosis not present

## 2016-09-25 DIAGNOSIS — E119 Type 2 diabetes mellitus without complications: Secondary | ICD-10-CM | POA: Diagnosis not present

## 2016-09-25 DIAGNOSIS — M6281 Muscle weakness (generalized): Secondary | ICD-10-CM | POA: Diagnosis not present

## 2016-09-26 ENCOUNTER — Encounter: Payer: Self-pay | Admitting: Physician Assistant

## 2016-09-26 ENCOUNTER — Ambulatory Visit (INDEPENDENT_AMBULATORY_CARE_PROVIDER_SITE_OTHER): Payer: Medicare Other | Admitting: Physician Assistant

## 2016-09-26 VITALS — BP 103/65 | HR 94 | Ht 65.0 in | Wt 261.0 lb

## 2016-09-26 DIAGNOSIS — F329 Major depressive disorder, single episode, unspecified: Secondary | ICD-10-CM | POA: Diagnosis not present

## 2016-09-26 DIAGNOSIS — F32A Depression, unspecified: Secondary | ICD-10-CM

## 2016-09-26 DIAGNOSIS — F419 Anxiety disorder, unspecified: Secondary | ICD-10-CM | POA: Diagnosis not present

## 2016-09-26 DIAGNOSIS — R42 Dizziness and giddiness: Secondary | ICD-10-CM | POA: Diagnosis not present

## 2016-09-26 MED ORDER — ALPRAZOLAM 1 MG PO TABS: 1.0000 mg | ORAL_TABLET | Freq: Two times a day (BID) | ORAL | 5 refills | Status: DC | PRN

## 2016-09-26 MED ORDER — OLANZAPINE 5 MG PO TABS: 5.0000 mg | ORAL_TABLET | Freq: Every day | ORAL | 1 refills | Status: DC

## 2016-09-26 MED ORDER — CITALOPRAM HYDROBROMIDE 40 MG PO TABS: 40.0000 mg | ORAL_TABLET | Freq: Every day | ORAL | 5 refills | Status: DC

## 2016-09-26 MED ORDER — PHENTERMINE HCL 37.5 MG PO TABS: 37.5000 mg | ORAL_TABLET | Freq: Every day | ORAL | 0 refills | Status: DC

## 2016-09-26 NOTE — Progress Notes (Signed)
Subjective:    Patient ID: Sarah Wright, female    DOB: June 04, 1945, 71 y.o.   MRN: [id number]  HPI  Pt is a 71 yo obese female who presents to the clinic to get medication refills.   Anxiety and depression are well controlled with medications. No problems or concerns.   She would like to discuss dizziness with position changes for the last 3 years but more noticeable in the last few months. Seems to be worse with position changes. Not changed with phentermine. Checking BS and around 123. Pt denies any alcohol use. Denies any slurred speech, limb weakness. She is being seen by home health to work on balance. No new medications.   She continues to try to lose weight with phentermine. She lost 1lb in last 2 weeks.   .. Active Ambulatory Problems    Diagnosis Date Noted  . Anxiety and depression 03/16/2014  . Hyperlipidemia 03/16/2014  . COLD (chronic obstructive lung disease) ([place]) 03/16/2014  . Insomnia 03/16/2014  . Esophageal reflux 03/16/2014  . Bilateral hand pain 03/16/2014  . DDD (degenerative disc disease), lumbar 03/16/2014  . Lumbar spondylosis 03/17/2014  . Aortic atherosclerosis ([place]) 03/17/2014  . Chronic venous insufficiency 03/27/2014  . H/O colonoscopy 04/02/2014  . Thyroid disease 04/02/2014  . Cataract 04/02/2014  . Type 2 diabetes mellitus ([place]) 04/02/2014  . Primary osteoarthritis of both knees 07/21/2014  . Morbid obesity ([place]) 10/07/2014  . Dizziness 01/04/2015  . Sensorineural hearing loss of both ears 01/26/2015  . Thyroid nodule 06/22/2015  . Memory loss 07/29/2015  . Psoriasis 09/23/2015  . Right shoulder injury 01/27/2016  . Balance problem 08/13/2016  . Left shoulder pain 08/13/2016  . Numbness around mouth 09/08/2016   Resolved Ambulatory Problems    Diagnosis Date Noted  . No Resolved Ambulatory Problems   Past Medical History:  Diagnosis Date  . Diabetes mellitus without complication ([place])   . Hyperlipidemia   . Thyroid disease          Review of Systems  All other systems reviewed and are negative.      Objective:   Physical Exam  Constitutional: She is oriented to person, place, and time. She appears well-developed and well-nourished.  Obesity.   HENT:  Head: Normocephalic and atraumatic.  Cardiovascular: Normal rate, regular rhythm and normal heart sounds.   Pulmonary/Chest: Effort normal and breath sounds normal. She has no wheezes.  Neurological: She is alert and oriented to person, place, and time.  Dizziness with head positional changes.   Psychiatric: She has a normal mood and affect. Her behavior is normal.          Assessment & Plan:  [name] KitchenMarland KitchenDiagnoses and all orders for this visit:  Anxiety and depression -     ALPRAZolam (XANAX) 1 MG tablet; Take 1 tablet (1 mg total) by mouth 2 (two) times daily as needed for anxiety. -     OLANZapine (ZYPREXA) 5 MG tablet; Take 1 tablet (5 mg total) by mouth at bedtime. -     citalopram (CELEXA) 40 MG tablet; Take 1 tablet (40 mg total) by mouth daily.  Dizziness  Morbid obesity ([facility]) -     phentermine (ADIPEX-P) 37.5 MG tablet; Take 1 tablet (37.5 mg total) by mouth daily before breakfast.   .. Depression screen [facility] 2/9 09/26/2016 08/11/2016 07/26/2016  Decreased Interest 2 1 2   Down, Depressed, Hopeless 2 2 3   PHQ - 2 Score 4 3 5   Altered sleeping 1 2 2   Tired,  decreased energy 1 3 3   Change in appetite 1 3 3   Feeling bad or failure about yourself  1 3 3   Trouble concentrating 1 2 2   Moving slowly or fidgety/restless 0 2 2  Suicidal thoughts 1 1 0  PHQ-9 Score 10 19 20    Will see if we can add to home health vestibular rehab.   Refilled phentermine for one more month. Pt aware if she has not lost 4lbs will be D/C. Pt has not been able to afford any other weight loss medications.   Discussed ongoing use of mobic. Warned against GI upset and ulcers. Take with food stop if having GI upset.

## 2016-09-27 ENCOUNTER — Telehealth: Payer: Self-pay | Admitting: Physician Assistant

## 2016-09-27 NOTE — Telephone Encounter (Signed)
I would like to add to her ongoing home health for balance. I would like for them to work with her vertigo and do some epley maneuvers for BPPV. Can we do this verbal or do I need to make another referral?

## 2016-09-29 DIAGNOSIS — M25512 Pain in left shoulder: Secondary | ICD-10-CM | POA: Diagnosis not present

## 2016-09-29 DIAGNOSIS — M6281 Muscle weakness (generalized): Secondary | ICD-10-CM | POA: Diagnosis not present

## 2016-09-29 DIAGNOSIS — E119 Type 2 diabetes mellitus without complications: Secondary | ICD-10-CM | POA: Diagnosis not present

## 2016-09-29 DIAGNOSIS — M47816 Spondylosis without myelopathy or radiculopathy, lumbar region: Secondary | ICD-10-CM | POA: Diagnosis not present

## 2016-09-29 DIAGNOSIS — M170 Bilateral primary osteoarthritis of knee: Secondary | ICD-10-CM | POA: Diagnosis not present

## 2016-09-29 DIAGNOSIS — M5136 Other intervertebral disc degeneration, lumbar region: Secondary | ICD-10-CM | POA: Diagnosis not present

## 2016-10-03 DIAGNOSIS — M170 Bilateral primary osteoarthritis of knee: Secondary | ICD-10-CM | POA: Diagnosis not present

## 2016-10-03 DIAGNOSIS — E119 Type 2 diabetes mellitus without complications: Secondary | ICD-10-CM | POA: Diagnosis not present

## 2016-10-03 DIAGNOSIS — M25512 Pain in left shoulder: Secondary | ICD-10-CM | POA: Diagnosis not present

## 2016-10-03 DIAGNOSIS — M5136 Other intervertebral disc degeneration, lumbar region: Secondary | ICD-10-CM | POA: Diagnosis not present

## 2016-10-03 DIAGNOSIS — M47816 Spondylosis without myelopathy or radiculopathy, lumbar region: Secondary | ICD-10-CM | POA: Diagnosis not present

## 2016-10-03 DIAGNOSIS — M6281 Muscle weakness (generalized): Secondary | ICD-10-CM | POA: Diagnosis not present

## 2016-10-04 ENCOUNTER — Telehealth: Payer: Self-pay | Admitting: *Deleted

## 2016-10-04 DIAGNOSIS — R2689 Other abnormalities of gait and mobility: Secondary | ICD-10-CM

## 2016-10-04 DIAGNOSIS — R42 Dizziness and giddiness: Secondary | ICD-10-CM

## 2016-10-04 NOTE — Telephone Encounter (Signed)
Referral placed for vestibular rehab.

## 2016-10-05 DIAGNOSIS — E119 Type 2 diabetes mellitus without complications: Secondary | ICD-10-CM | POA: Diagnosis not present

## 2016-10-05 DIAGNOSIS — M47816 Spondylosis without myelopathy or radiculopathy, lumbar region: Secondary | ICD-10-CM | POA: Diagnosis not present

## 2016-10-05 DIAGNOSIS — M25512 Pain in left shoulder: Secondary | ICD-10-CM | POA: Diagnosis not present

## 2016-10-05 DIAGNOSIS — M170 Bilateral primary osteoarthritis of knee: Secondary | ICD-10-CM | POA: Diagnosis not present

## 2016-10-05 DIAGNOSIS — M6281 Muscle weakness (generalized): Secondary | ICD-10-CM | POA: Diagnosis not present

## 2016-10-05 DIAGNOSIS — M5136 Other intervertebral disc degeneration, lumbar region: Secondary | ICD-10-CM | POA: Diagnosis not present

## 2016-10-06 ENCOUNTER — Ambulatory Visit: Payer: Medicare Other

## 2016-10-10 ENCOUNTER — Other Ambulatory Visit: Payer: Self-pay | Admitting: Physician Assistant

## 2016-10-10 ENCOUNTER — Ambulatory Visit (HOSPITAL_COMMUNITY)
Admission: RE | Admit: 2016-10-10 | Discharge: 2016-10-10 | Disposition: A | Discharge status: Home / Self Care | Payer: Medicare Other | Source: Ambulatory Visit | Attending: Cardiovascular Disease | Admitting: Cardiovascular Disease

## 2016-10-10 DIAGNOSIS — E7800 Pure hypercholesterolemia, unspecified: Secondary | ICD-10-CM | POA: Diagnosis not present

## 2016-10-10 DIAGNOSIS — I6523 Occlusion and stenosis of bilateral carotid arteries: Secondary | ICD-10-CM | POA: Diagnosis not present

## 2016-10-10 DIAGNOSIS — I700 Atherosclerosis of aorta: Secondary | ICD-10-CM | POA: Diagnosis not present

## 2016-10-10 DIAGNOSIS — M47816 Spondylosis without myelopathy or radiculopathy, lumbar region: Secondary | ICD-10-CM | POA: Diagnosis not present

## 2016-10-10 DIAGNOSIS — E559 Vitamin D deficiency, unspecified: Secondary | ICD-10-CM | POA: Diagnosis not present

## 2016-10-10 DIAGNOSIS — G8929 Other chronic pain: Secondary | ICD-10-CM | POA: Diagnosis not present

## 2016-10-10 DIAGNOSIS — Z79891 Long term (current) use of opiate analgesic: Secondary | ICD-10-CM | POA: Diagnosis not present

## 2016-10-11 DIAGNOSIS — M47816 Spondylosis without myelopathy or radiculopathy, lumbar region: Secondary | ICD-10-CM | POA: Diagnosis not present

## 2016-10-11 DIAGNOSIS — M6281 Muscle weakness (generalized): Secondary | ICD-10-CM | POA: Diagnosis not present

## 2016-10-11 DIAGNOSIS — M25512 Pain in left shoulder: Secondary | ICD-10-CM | POA: Diagnosis not present

## 2016-10-11 DIAGNOSIS — M5136 Other intervertebral disc degeneration, lumbar region: Secondary | ICD-10-CM | POA: Diagnosis not present

## 2016-10-11 DIAGNOSIS — E119 Type 2 diabetes mellitus without complications: Secondary | ICD-10-CM | POA: Diagnosis not present

## 2016-10-11 DIAGNOSIS — M170 Bilateral primary osteoarthritis of knee: Secondary | ICD-10-CM | POA: Diagnosis not present

## 2016-10-13 DIAGNOSIS — M47816 Spondylosis without myelopathy or radiculopathy, lumbar region: Secondary | ICD-10-CM | POA: Diagnosis not present

## 2016-10-13 DIAGNOSIS — M6281 Muscle weakness (generalized): Secondary | ICD-10-CM | POA: Diagnosis not present

## 2016-10-13 DIAGNOSIS — M25512 Pain in left shoulder: Secondary | ICD-10-CM | POA: Diagnosis not present

## 2016-10-13 DIAGNOSIS — M170 Bilateral primary osteoarthritis of knee: Secondary | ICD-10-CM | POA: Diagnosis not present

## 2016-10-13 DIAGNOSIS — E119 Type 2 diabetes mellitus without complications: Secondary | ICD-10-CM | POA: Diagnosis not present

## 2016-10-13 DIAGNOSIS — M5136 Other intervertebral disc degeneration, lumbar region: Secondary | ICD-10-CM | POA: Diagnosis not present

## 2016-10-16 ENCOUNTER — Ambulatory Visit (INDEPENDENT_AMBULATORY_CARE_PROVIDER_SITE_OTHER): Payer: Medicare Other | Admitting: Physician Assistant

## 2016-10-16 ENCOUNTER — Encounter: Payer: Self-pay | Admitting: Physician Assistant

## 2016-10-16 VITALS — BP 139/73 | HR 89 | Temp 98.2°F | Resp 18 | Wt 265.2 lb

## 2016-10-16 DIAGNOSIS — I6523 Occlusion and stenosis of bilateral carotid arteries: Secondary | ICD-10-CM

## 2016-10-16 DIAGNOSIS — R300 Dysuria: Secondary | ICD-10-CM | POA: Diagnosis not present

## 2016-10-16 MED ORDER — CEPHALEXIN 500 MG PO CAPS: 500.0000 mg | ORAL_CAPSULE | Freq: Two times a day (BID) | ORAL | 0 refills | Status: DC

## 2016-10-16 NOTE — Patient Instructions (Signed)
- Take Keflex twice a day for 7 days - Azo as needed for pain/dysuria - Drink plenty of clear liquids   Urinary Tract Infection, Adult A urinary tract infection (UTI) is an infection of any part of the urinary tract, which includes the kidneys, ureters, bladder, and urethra. These organs make, store, and get rid of urine in the body. UTI can be a bladder infection (cystitis) or kidney infection (pyelonephritis). What are the causes? This infection may be caused by fungi, viruses, or bacteria. Bacteria are the most common cause of UTIs. This condition can also be caused by repeated incomplete emptying of the bladder during urination. What increases the risk? This condition is more likely to develop if:  You ignore your need to urinate or hold urine for long periods of time.  You do not empty your bladder completely during urination.  You wipe back to front after urinating or having a bowel movement, if you are female.  You are uncircumcised, if you are female.  You are constipated.  You have a urinary catheter that stays in place (indwelling).  You have a weak defense (immune) system.  You have a medical condition that affects your bowels, kidneys, or bladder.  You have diabetes.  You take antibiotic medicines frequently or for long periods of time, and the antibiotics no longer work well against certain types of infections (antibiotic resistance).  You take medicines that irritate your urinary tract.  You are exposed to chemicals that irritate your urinary tract.  You are female.  What are the signs or symptoms? Symptoms of this condition include:  Fever.  Frequent urination or passing small amounts of urine frequently.  Needing to urinate urgently.  Pain or burning with urination.  Urine that smells bad or unusual.  Cloudy urine.  Pain in the lower abdomen or back.  Trouble urinating.  Blood in the urine.  Vomiting or being less hungry than  normal.  Diarrhea or abdominal pain.  Vaginal discharge, if you are female.  How is this diagnosed? This condition is diagnosed with a medical history and physical exam. You will also need to provide a urine sample to test your urine. Other tests may be done, including:  Blood tests.  Sexually transmitted disease (STD) testing.  If you have had more than one UTI, a cystoscopy or imaging studies may be done to determine the cause of the infections. How is this treated? Treatment for this condition often includes a combination of two or more of the following:  Antibiotic medicine.  Other medicines to treat less common causes of UTI.  Over-the-counter medicines to treat pain.  Drinking enough water to stay hydrated.  Follow these instructions at home:  Take over-the-counter and prescription medicines only as told by your health care provider.  If you were prescribed an antibiotic, take it as told by your health care provider. Do not stop taking the antibiotic even if you start to feel better.  Avoid alcohol, caffeine, tea, and carbonated beverages. They can irritate your bladder.  Drink enough fluid to keep your urine clear or pale yellow.  Keep all follow-up visits as told by your health care provider. This is important.  Make sure to: ? Empty your bladder often and completely. Do not hold urine for long periods of time. ? Empty your bladder before and after sex. ? Wipe from front to back after a bowel movement if you are female. Use each tissue one time when you wipe. Contact a health care  provider if:  You have back pain.  You have a fever.  You feel nauseous or vomit.  Your symptoms do not get better after 3 days.  Your symptoms go away and then return. Get help right away if:  You have severe back pain or lower abdominal pain.  You are vomiting and cannot keep down any medicines or water. This information is not intended to replace advice given to you by  your health care provider. Make sure you discuss any questions you have with your health care provider. Document Released: 01/18/2005 Document Revised: 09/22/2015 Document Reviewed: 03/01/2015 Elsevier Interactive Patient Education  2017 [employer].

## 2016-10-16 NOTE — Progress Notes (Signed)
HPI:                                                                Sarah Wright is a 71 y.o. female who presents to [facility] today for dysuria  Dysuria   This is a new problem. The current episode started yesterday. The problem occurs every urination. The problem has been unchanged. Associated symptoms include chills, frequency, hematuria, nausea and urgency. Pertinent negatives include no flank pain, sweats or vomiting. There is no history of kidney stones or recurrent UTIs.     Past Medical History:  Diagnosis Date  . Diabetes mellitus without complication ([place])   . Hyperlipidemia   . Thyroid disease    Past Surgical History:  Procedure Laterality Date  . ABDOMINAL HYSTERECTOMY    . CARPAL TUNNEL RELEASE     both wrists  . CATARACT EXTRACTION, BILATERAL    . CERVICAL FUSION    . GALLBLADDER SURGERY    . right foot surgery    . THYROIDECTOMY, PARTIAL     right side removed   Social History  Substance Use Topics  . Smoking status: Former Smoker    Quit date: 02/12/2014  . Smokeless tobacco: Never Used  . Alcohol use No   family history includes Depression in her mother; Diabetes in her father; Heart attack in her father.  ROS: negative except as noted in the HPI  Medications: Current Outpatient Prescriptions  Medication Sig Dispense Refill  . albuterol (PROVENTIL HFA;VENTOLIN HFA) 108 (90 Base) MCG/ACT inhaler Inhale 2 puffs into the lungs every 6 (six) hours as needed for wheezing or shortness of breath. 1 Inhaler 2  . ALPRAZolam (XANAX) 1 MG tablet Take 1 tablet (1 mg total) by mouth 2 (two) times daily as needed for anxiety. 60 tablet 5  . AMBULATORY NON FORMULARY MEDICATION Knee-high, medium compression, graduated compression stockings. Apply to lower extremities. [employer], Zippered Compression Stockings, medium circ, long length 1 each 0  . aspirin 81 MG tablet Take 81 mg by mouth daily.    [name]  atorvastatin (LIPITOR) 20 MG tablet Take 1 tablet (20 mg total) by mouth daily. 30 tablet 11  . BD PEN NEEDLE NANO U/F 32G X 4 MM MISC USE WITH VICTOZA ONCE DAILY 100 each 2  . budesonide-formoterol (SYMBICORT) 160-4.5 MCG/ACT inhaler Inhale 2 puffs into the lungs 2 (two) times daily. 10.2 Inhaler 11  . buPROPion (WELLBUTRIN XL) 150 MG 24 hr tablet TAKE 2 TABLETS BY MOUTH EVERY DAY IN THE MORNING 60 tablet 5  . calcipotriene (DOVONOX) 0.005 % cream Apply 1-2 grams to affected area 1-2 times daily. 360 g 1  . cephALEXin (KEFLEX) 500 MG capsule Take 1 capsule (500 mg total) by mouth 2 (two) times daily. 14 capsule 0  . citalopram (CELEXA) 40 MG tablet Take 1 tablet (40 mg total) by mouth daily. 30 tablet 5  . donepezil (ARICEPT) 10 MG tablet TAKE ONE TABLET (5 MG TOTAL) BY MOUTH AT BEDTIME. TO HELP PRESERVE MEMORY. 90 tablet 1  . furosemide (LASIX) 20 MG tablet TAKE ONE TABLET BY MOUTH DAILY AS NEEDED FOR ANKLE SWELLING 90 tablet 0  . gabapentin (NEURONTIN) 300 MG capsule TAKE 1 CAP AT BEDTIME X1 WK,TWICE DAILY X1 WK, THEN  3 TIMES DAILY. MAY DOUBLE WEEKLY TO 3600MG /DAY 180 capsule 5  . levocetirizine (XYZAL) 5 MG tablet Take 1 tablet (5 mg total) by mouth every evening. 30 tablet 5  . liraglutide (VICTOZA) 18 MG/3ML SOPN 1.8 MG DAILY. Please include needles. 4 pen 5  . lubiprostone (AMITIZA) 8 MCG capsule Take 8 mcg by mouth daily with breakfast.    . meloxicam (MOBIC) 15 MG tablet TAKE 1 TABLET (15 MG TOTAL) BY MOUTH DAILY. 30 tablet 3  . montelukast (SINGULAIR) 10 MG tablet Take 1 tablet (10 mg total) by mouth at bedtime. 90 tablet 4  . OLANZapine (ZYPREXA) 5 MG tablet Take 1 tablet (5 mg total) by mouth at bedtime. 90 tablet 1  . Oxycodone HCl 10 MG TABS TAKE 1 TABELT BY MOUTH EVERY 6 HOURS FOR 30 DAYS  0  . phentermine (ADIPEX-P) 37.5 MG tablet Take 1 tablet (37.5 mg total) by mouth daily before breakfast. 30 tablet 0  . tiotropium (SPIRIVA HANDIHALER) 18 MCG inhalation capsule INHALE ONE CAPSULE  VIA HANDIHALER ONE TIME DAILY 30 capsule 11   No current facility-administered medications for this visit.    Allergies  Allergen Reactions  . Sulfa Antibiotics     Patient had reaction as a child; does not know what type of reaction       Objective:  BP 139/73   Pulse 89   Temp 98.2 F (36.8 C)   Resp 18   Wt 265 lb 3.2 oz (120.3 kg)   BMI 44.13 kg/m  Gen: well-groomed, cooperative, morbidly obese, not ill-appearing, no distress Pulm: Normal work of breathing, normal phonation, clear to auscultation bilaterally CV: Normal rate, regular rhythm, s1 and s2 distinct, no murmurs, clicks or rubs  GI: GI: abdomen soft, nondistended, there is suprapubic tenderness, no CVA tenderness MSK: moving all extremities, normal gait and station, no peripheral edema Skin: warm, dry, intact; no rashes  No results found for this or any previous visit (from the past 72 hour(s)). No results found.   Assessment and Plan: 72 y.o. female with   1. Dysuria - unable to perform POCT UA due to scant urine sample. Treating empirically for uncomplicated acute cystitis. - reviewed prior urine culture form 07/2015 which showed susceptibility to cephalosporins - cephALEXin (KEFLEX) 500 MG capsule; Take 1 capsule (500 mg total) by mouth 2 (two) times daily.  Dispense: 14 capsule; Refill: 0 - Urine Culture pending  Patient education and anticipatory guidance given Patient agrees with treatment plan Follow-up as needed if symptoms worsen or fail to improve  Darlyne Russian PA-C

## 2016-10-18 LAB — URINE CULTURE: Organism ID, Bacteria: NO GROWTH

## 2016-10-18 NOTE — Progress Notes (Signed)
Hi Margaree,  Your urine culture was negative. There is no evidence of an infection. I recommend you discontinue the antibiotic. Continue Azo as needed and follow-up if you are still having symptoms. We should investigate other causes.  Best, Evlyn Clines

## 2016-10-28 ENCOUNTER — Other Ambulatory Visit: Payer: Self-pay | Admitting: Physician Assistant

## 2016-11-02 DIAGNOSIS — M542 Cervicalgia: Secondary | ICD-10-CM | POA: Diagnosis not present

## 2016-11-02 DIAGNOSIS — M5136 Other intervertebral disc degeneration, lumbar region: Secondary | ICD-10-CM | POA: Diagnosis not present

## 2016-11-02 DIAGNOSIS — Z5181 Encounter for therapeutic drug level monitoring: Secondary | ICD-10-CM | POA: Diagnosis not present

## 2016-11-02 DIAGNOSIS — Z79899 Other long term (current) drug therapy: Secondary | ICD-10-CM | POA: Diagnosis not present

## 2016-11-02 DIAGNOSIS — M5416 Radiculopathy, lumbar region: Secondary | ICD-10-CM | POA: Diagnosis not present

## 2016-11-02 DIAGNOSIS — G894 Chronic pain syndrome: Secondary | ICD-10-CM | POA: Diagnosis not present

## 2016-11-09 ENCOUNTER — Other Ambulatory Visit: Payer: Self-pay | Admitting: Physician Assistant

## 2016-11-09 DIAGNOSIS — M47816 Spondylosis without myelopathy or radiculopathy, lumbar region: Secondary | ICD-10-CM | POA: Diagnosis not present

## 2016-11-09 DIAGNOSIS — M48061 Spinal stenosis, lumbar region without neurogenic claudication: Secondary | ICD-10-CM | POA: Diagnosis not present

## 2016-11-09 DIAGNOSIS — M47896 Other spondylosis, lumbar region: Secondary | ICD-10-CM | POA: Diagnosis not present

## 2016-11-09 DIAGNOSIS — M5136 Other intervertebral disc degeneration, lumbar region: Secondary | ICD-10-CM | POA: Diagnosis not present

## 2016-11-09 DIAGNOSIS — M4316 Spondylolisthesis, lumbar region: Secondary | ICD-10-CM | POA: Diagnosis not present

## 2016-11-29 DIAGNOSIS — L900 Lichen sclerosus et atrophicus: Secondary | ICD-10-CM | POA: Diagnosis not present

## 2016-11-29 DIAGNOSIS — Z9079 Acquired absence of other genital organ(s): Secondary | ICD-10-CM | POA: Diagnosis not present

## 2016-11-29 DIAGNOSIS — Z87891 Personal history of nicotine dependence: Secondary | ICD-10-CM | POA: Diagnosis not present

## 2016-11-29 DIAGNOSIS — F329 Major depressive disorder, single episode, unspecified: Secondary | ICD-10-CM | POA: Diagnosis not present

## 2016-11-29 DIAGNOSIS — Z87412 Personal history of vulvar dysplasia: Secondary | ICD-10-CM | POA: Diagnosis not present

## 2016-11-29 DIAGNOSIS — N903 Dysplasia of vulva, unspecified: Secondary | ICD-10-CM | POA: Diagnosis not present

## 2016-12-04 DIAGNOSIS — M21611 Bunion of right foot: Secondary | ICD-10-CM | POA: Diagnosis not present

## 2016-12-04 DIAGNOSIS — M2041 Other hammer toe(s) (acquired), right foot: Secondary | ICD-10-CM | POA: Diagnosis not present

## 2016-12-04 DIAGNOSIS — M21612 Bunion of left foot: Secondary | ICD-10-CM | POA: Diagnosis not present

## 2016-12-04 DIAGNOSIS — L603 Nail dystrophy: Secondary | ICD-10-CM | POA: Diagnosis not present

## 2016-12-04 DIAGNOSIS — M2042 Other hammer toe(s) (acquired), left foot: Secondary | ICD-10-CM | POA: Diagnosis not present

## 2016-12-04 DIAGNOSIS — I739 Peripheral vascular disease, unspecified: Secondary | ICD-10-CM | POA: Diagnosis not present

## 2016-12-04 DIAGNOSIS — E1151 Type 2 diabetes mellitus with diabetic peripheral angiopathy without gangrene: Secondary | ICD-10-CM | POA: Diagnosis not present

## 2016-12-04 DIAGNOSIS — L84 Corns and callosities: Secondary | ICD-10-CM | POA: Diagnosis not present

## 2016-12-13 DIAGNOSIS — M5416 Radiculopathy, lumbar region: Secondary | ICD-10-CM | POA: Diagnosis not present

## 2016-12-13 DIAGNOSIS — Z23 Encounter for immunization: Secondary | ICD-10-CM | POA: Diagnosis not present

## 2016-12-13 DIAGNOSIS — Z5181 Encounter for therapeutic drug level monitoring: Secondary | ICD-10-CM | POA: Diagnosis not present

## 2016-12-13 DIAGNOSIS — M542 Cervicalgia: Secondary | ICD-10-CM | POA: Diagnosis not present

## 2016-12-13 DIAGNOSIS — M48061 Spinal stenosis, lumbar region without neurogenic claudication: Secondary | ICD-10-CM | POA: Diagnosis not present

## 2016-12-13 DIAGNOSIS — M5136 Other intervertebral disc degeneration, lumbar region: Secondary | ICD-10-CM | POA: Diagnosis not present

## 2016-12-13 DIAGNOSIS — Z79899 Other long term (current) drug therapy: Secondary | ICD-10-CM | POA: Diagnosis not present

## 2016-12-29 ENCOUNTER — Ambulatory Visit (INDEPENDENT_AMBULATORY_CARE_PROVIDER_SITE_OTHER): Payer: Medicare Other

## 2016-12-29 ENCOUNTER — Encounter: Payer: Self-pay | Admitting: Sports Medicine

## 2016-12-29 ENCOUNTER — Ambulatory Visit (INDEPENDENT_AMBULATORY_CARE_PROVIDER_SITE_OTHER): Payer: Medicare Other | Admitting: Sports Medicine

## 2016-12-29 DIAGNOSIS — M5031 Other cervical disc degeneration,  high cervical region: Secondary | ICD-10-CM | POA: Diagnosis not present

## 2016-12-29 DIAGNOSIS — M5030 Other cervical disc degeneration, unspecified cervical region: Secondary | ICD-10-CM

## 2016-12-29 DIAGNOSIS — S4991XD Unspecified injury of right shoulder and upper arm, subsequent encounter: Secondary | ICD-10-CM

## 2016-12-29 DIAGNOSIS — M50322 Other cervical disc degeneration at C5-C6 level: Secondary | ICD-10-CM | POA: Diagnosis not present

## 2016-12-29 MED ORDER — PREDNISONE 50 MG PO TABS: ORAL_TABLET | ORAL | 0 refills | Status: DC

## 2016-12-29 NOTE — Progress Notes (Signed)
   Subjective:    I'm seeing this patient as a consultation for:  [name], PA-C  CC: Right shoulder pain  HPI: This is a pleasant 71 year old female, I treated her in the recent past for right shoulder impingement syndrome, she responded extremely well to a subacromial injection. More recently she has had pain that radiates around the medial aspect of her right scapula, down the lateral aspect of her right upper arm, and starts in her neck. No new paresthesias in the arms. She still has a bit of a number deltoid with shoulder abduction but this is a different symptom she tells me.  Past medical history, Surgical history, Family history not pertinant except as noted below, Social history, Allergies, and medications have been entered into the medical record, reviewed, and no changes needed.   Review of Systems: No headache, visual changes, nausea, vomiting, diarrhea, constipation, dizziness, abdominal pain, skin rash, fevers, chills, night sweats, weight loss, swollen lymph nodes, body aches, joint swelling, muscle aches, chest pain, shortness of breath, mood changes, visual or auditory hallucinations.   Objective:   General: Well Developed, well nourished, and in no acute distress.  Neuro:  Extra-ocular muscles intact, able to move all 4 extremities, sensation grossly intact.  Deep tendon reflexes tested were normal. Psych: Alert and oriented, mood congruent with affect. ENT:  Ears and nose appear unremarkable.  Hearing grossly normal. Neck: Unremarkable overall appearance, trachea midline.  No visible thyroid enlargement. Eyes: Conjunctivae and lids appear unremarkable.  Pupils equal and round. Skin: Warm and dry, no rashes noted.  Cardiovascular: Pulses palpable, no extremity edema. Neck: Negative spurling's Full neck range of motion Grip strength and sensation normal in bilateral hands Strength good C4 to T1 distribution No sensory change to C4 to T1 Reflexes normal Right  Shoulder: Inspection reveals no abnormalities, atrophy or asymmetry. Palpation is normal with no tenderness over AC joint or bicipital groove. ROM is full in all planes. Rotator cuff strength normal throughout. Mildly positive Neer and Hawkin's tests, empty can, but this pain is completely different than the pain she came in for evaluation for today.[name] Speeds and Yergason's tests normal. No labral pathology noted with negative Obrien's, negative crank, negative clunk, and good stability. Normal scapular function observed. No painful arc and no drop arm sign. No apprehension sign  I did review a CT of the neck and soft tissues from several years ago that shows severe C5-C6 degenerative disc disease with central canal stenosis.  Impression and Recommendations:   This case required medical decision making of moderate complexity.  DDD (degenerative disc disease), cervical With right sided periscapular degenerative changes. Severe C5-C6 degenerative disc disease with central canal stenosis on neck CT. Prednisone, x-rays, home health physical therapy.  Right shoulder injury Persistence of right shoulder pain but I think it's more radicular this time rather than impingement related. Nonetheless we will have physical therapy work on her neck as well as rotator cuff rehabilitation.  ___________________________________________ Gwen Her. Dianah Field, M.D., ABFM., CAQSM. Primary Care and [employer] In[employer] of [employer] [facility] [employer]

## 2016-12-29 NOTE — Assessment & Plan Note (Signed)
With right sided periscapular degenerative changes. Severe C5-C6 degenerative disc disease with central canal stenosis on neck CT. Prednisone, x-rays, home health physical therapy.

## 2016-12-29 NOTE — Assessment & Plan Note (Signed)
Persistence of right shoulder pain but I think it's more radicular this time rather than impingement related. Nonetheless we will have physical therapy work on her neck as well as rotator cuff rehabilitation.

## 2017-01-02 DIAGNOSIS — Z87891 Personal history of nicotine dependence: Secondary | ICD-10-CM | POA: Diagnosis not present

## 2017-01-02 DIAGNOSIS — E1136 Type 2 diabetes mellitus with diabetic cataract: Secondary | ICD-10-CM | POA: Diagnosis not present

## 2017-01-02 DIAGNOSIS — F329 Major depressive disorder, single episode, unspecified: Secondary | ICD-10-CM | POA: Diagnosis not present

## 2017-01-02 DIAGNOSIS — F419 Anxiety disorder, unspecified: Secondary | ICD-10-CM | POA: Diagnosis not present

## 2017-01-02 DIAGNOSIS — M25512 Pain in left shoulder: Secondary | ICD-10-CM | POA: Diagnosis not present

## 2017-01-02 DIAGNOSIS — Z794 Long term (current) use of insulin: Secondary | ICD-10-CM | POA: Diagnosis not present

## 2017-01-02 DIAGNOSIS — M47816 Spondylosis without myelopathy or radiculopathy, lumbar region: Secondary | ICD-10-CM | POA: Diagnosis not present

## 2017-01-02 DIAGNOSIS — I872 Venous insufficiency (chronic) (peripheral): Secondary | ICD-10-CM | POA: Diagnosis not present

## 2017-01-02 DIAGNOSIS — Z7982 Long term (current) use of aspirin: Secondary | ICD-10-CM | POA: Diagnosis not present

## 2017-01-02 DIAGNOSIS — J449 Chronic obstructive pulmonary disease, unspecified: Secondary | ICD-10-CM | POA: Diagnosis not present

## 2017-01-02 DIAGNOSIS — M5010 Cervical disc disorder with radiculopathy, unspecified cervical region: Secondary | ICD-10-CM | POA: Diagnosis not present

## 2017-01-02 DIAGNOSIS — E039 Hypothyroidism, unspecified: Secondary | ICD-10-CM | POA: Diagnosis not present

## 2017-01-02 DIAGNOSIS — M170 Bilateral primary osteoarthritis of knee: Secondary | ICD-10-CM | POA: Diagnosis not present

## 2017-01-02 DIAGNOSIS — Z9181 History of falling: Secondary | ICD-10-CM | POA: Diagnosis not present

## 2017-01-02 DIAGNOSIS — E785 Hyperlipidemia, unspecified: Secondary | ICD-10-CM | POA: Diagnosis not present

## 2017-01-02 DIAGNOSIS — M5136 Other intervertebral disc degeneration, lumbar region: Secondary | ICD-10-CM | POA: Diagnosis not present

## 2017-01-02 DIAGNOSIS — M4802 Spinal stenosis, cervical region: Secondary | ICD-10-CM | POA: Diagnosis not present

## 2017-01-08 DIAGNOSIS — M5136 Other intervertebral disc degeneration, lumbar region: Secondary | ICD-10-CM | POA: Diagnosis not present

## 2017-01-09 DIAGNOSIS — M5010 Cervical disc disorder with radiculopathy, unspecified cervical region: Secondary | ICD-10-CM | POA: Diagnosis not present

## 2017-01-09 DIAGNOSIS — M170 Bilateral primary osteoarthritis of knee: Secondary | ICD-10-CM | POA: Diagnosis not present

## 2017-01-09 DIAGNOSIS — E1136 Type 2 diabetes mellitus with diabetic cataract: Secondary | ICD-10-CM | POA: Diagnosis not present

## 2017-01-09 DIAGNOSIS — M5136 Other intervertebral disc degeneration, lumbar region: Secondary | ICD-10-CM | POA: Diagnosis not present

## 2017-01-09 DIAGNOSIS — M47816 Spondylosis without myelopathy or radiculopathy, lumbar region: Secondary | ICD-10-CM | POA: Diagnosis not present

## 2017-01-09 DIAGNOSIS — M4802 Spinal stenosis, cervical region: Secondary | ICD-10-CM | POA: Diagnosis not present

## 2017-01-10 ENCOUNTER — Other Ambulatory Visit: Payer: Self-pay | Admitting: Physician Assistant

## 2017-01-10 DIAGNOSIS — H52223 Regular astigmatism, bilateral: Secondary | ICD-10-CM | POA: Diagnosis not present

## 2017-01-10 DIAGNOSIS — H26493 Other secondary cataract, bilateral: Secondary | ICD-10-CM | POA: Diagnosis not present

## 2017-01-10 DIAGNOSIS — Z961 Presence of intraocular lens: Secondary | ICD-10-CM | POA: Diagnosis not present

## 2017-01-10 DIAGNOSIS — H5203 Hypermetropia, bilateral: Secondary | ICD-10-CM | POA: Diagnosis not present

## 2017-01-10 DIAGNOSIS — H524 Presbyopia: Secondary | ICD-10-CM | POA: Diagnosis not present

## 2017-01-10 DIAGNOSIS — H353131 Nonexudative age-related macular degeneration, bilateral, early dry stage: Secondary | ICD-10-CM | POA: Diagnosis not present

## 2017-01-10 DIAGNOSIS — M25512 Pain in left shoulder: Secondary | ICD-10-CM

## 2017-01-10 DIAGNOSIS — H04123 Dry eye syndrome of bilateral lacrimal glands: Secondary | ICD-10-CM | POA: Diagnosis not present

## 2017-01-10 DIAGNOSIS — Z7984 Long term (current) use of oral hypoglycemic drugs: Secondary | ICD-10-CM | POA: Diagnosis not present

## 2017-01-10 DIAGNOSIS — E119 Type 2 diabetes mellitus without complications: Secondary | ICD-10-CM | POA: Diagnosis not present

## 2017-01-10 DIAGNOSIS — H43813 Vitreous degeneration, bilateral: Secondary | ICD-10-CM | POA: Diagnosis not present

## 2017-01-10 LAB — HM DIABETES EYE EXAM

## 2017-01-11 DIAGNOSIS — M5010 Cervical disc disorder with radiculopathy, unspecified cervical region: Secondary | ICD-10-CM | POA: Diagnosis not present

## 2017-01-11 DIAGNOSIS — M170 Bilateral primary osteoarthritis of knee: Secondary | ICD-10-CM | POA: Diagnosis not present

## 2017-01-11 DIAGNOSIS — E1136 Type 2 diabetes mellitus with diabetic cataract: Secondary | ICD-10-CM | POA: Diagnosis not present

## 2017-01-11 DIAGNOSIS — M4802 Spinal stenosis, cervical region: Secondary | ICD-10-CM | POA: Diagnosis not present

## 2017-01-11 DIAGNOSIS — M5136 Other intervertebral disc degeneration, lumbar region: Secondary | ICD-10-CM | POA: Diagnosis not present

## 2017-01-11 DIAGNOSIS — M47816 Spondylosis without myelopathy or radiculopathy, lumbar region: Secondary | ICD-10-CM | POA: Diagnosis not present

## 2017-01-12 ENCOUNTER — Telehealth: Payer: Self-pay | Admitting: Physician Assistant

## 2017-01-12 DIAGNOSIS — E1136 Type 2 diabetes mellitus with diabetic cataract: Secondary | ICD-10-CM | POA: Diagnosis not present

## 2017-01-12 DIAGNOSIS — M170 Bilateral primary osteoarthritis of knee: Secondary | ICD-10-CM | POA: Diagnosis not present

## 2017-01-12 DIAGNOSIS — M5010 Cervical disc disorder with radiculopathy, unspecified cervical region: Secondary | ICD-10-CM | POA: Diagnosis not present

## 2017-01-12 DIAGNOSIS — M4802 Spinal stenosis, cervical region: Secondary | ICD-10-CM | POA: Diagnosis not present

## 2017-01-12 DIAGNOSIS — M5136 Other intervertebral disc degeneration, lumbar region: Secondary | ICD-10-CM | POA: Diagnosis not present

## 2017-01-12 DIAGNOSIS — M47816 Spondylosis without myelopathy or radiculopathy, lumbar region: Secondary | ICD-10-CM | POA: Diagnosis not present

## 2017-01-12 NOTE — Telephone Encounter (Signed)
phone number is [phone]

## 2017-01-12 NOTE — Telephone Encounter (Signed)
Okay 

## 2017-01-12 NOTE — Telephone Encounter (Signed)
Zelphia Cairo PT with [facility] called. She is requesting verbal orders for twice a week for 4 wks.  Thanks.

## 2017-01-12 NOTE — Telephone Encounter (Signed)
Verbal orders given, the orders are also in the written referral faxed to them.

## 2017-01-15 NOTE — Telephone Encounter (Signed)
Great!

## 2017-01-17 ENCOUNTER — Telehealth: Payer: Self-pay | Admitting: Physician Assistant

## 2017-01-17 DIAGNOSIS — M5136 Other intervertebral disc degeneration, lumbar region: Secondary | ICD-10-CM | POA: Diagnosis not present

## 2017-01-17 DIAGNOSIS — M47816 Spondylosis without myelopathy or radiculopathy, lumbar region: Secondary | ICD-10-CM | POA: Diagnosis not present

## 2017-01-17 DIAGNOSIS — M170 Bilateral primary osteoarthritis of knee: Secondary | ICD-10-CM | POA: Diagnosis not present

## 2017-01-17 DIAGNOSIS — M4802 Spinal stenosis, cervical region: Secondary | ICD-10-CM | POA: Diagnosis not present

## 2017-01-17 DIAGNOSIS — M5010 Cervical disc disorder with radiculopathy, unspecified cervical region: Secondary | ICD-10-CM | POA: Diagnosis not present

## 2017-01-17 DIAGNOSIS — E1136 Type 2 diabetes mellitus with diabetic cataract: Secondary | ICD-10-CM | POA: Diagnosis not present

## 2017-01-17 NOTE — Telephone Encounter (Addendum)
Rep  from [facility] mail order called.  Following up on request for refill on Calcipotriene 0.05% cream - faxed 17th, 20th & today.  Thank you.

## 2017-01-19 DIAGNOSIS — M170 Bilateral primary osteoarthritis of knee: Secondary | ICD-10-CM | POA: Diagnosis not present

## 2017-01-19 DIAGNOSIS — M5010 Cervical disc disorder with radiculopathy, unspecified cervical region: Secondary | ICD-10-CM | POA: Diagnosis not present

## 2017-01-19 DIAGNOSIS — M4802 Spinal stenosis, cervical region: Secondary | ICD-10-CM | POA: Diagnosis not present

## 2017-01-19 DIAGNOSIS — E1136 Type 2 diabetes mellitus with diabetic cataract: Secondary | ICD-10-CM | POA: Diagnosis not present

## 2017-01-19 DIAGNOSIS — M47816 Spondylosis without myelopathy or radiculopathy, lumbar region: Secondary | ICD-10-CM | POA: Diagnosis not present

## 2017-01-19 DIAGNOSIS — M5136 Other intervertebral disc degeneration, lumbar region: Secondary | ICD-10-CM | POA: Diagnosis not present

## 2017-01-22 DIAGNOSIS — M5010 Cervical disc disorder with radiculopathy, unspecified cervical region: Secondary | ICD-10-CM | POA: Diagnosis not present

## 2017-01-22 DIAGNOSIS — M5136 Other intervertebral disc degeneration, lumbar region: Secondary | ICD-10-CM | POA: Diagnosis not present

## 2017-01-22 DIAGNOSIS — M170 Bilateral primary osteoarthritis of knee: Secondary | ICD-10-CM | POA: Diagnosis not present

## 2017-01-22 DIAGNOSIS — M4802 Spinal stenosis, cervical region: Secondary | ICD-10-CM | POA: Diagnosis not present

## 2017-01-22 DIAGNOSIS — M47816 Spondylosis without myelopathy or radiculopathy, lumbar region: Secondary | ICD-10-CM | POA: Diagnosis not present

## 2017-01-22 DIAGNOSIS — E1136 Type 2 diabetes mellitus with diabetic cataract: Secondary | ICD-10-CM | POA: Diagnosis not present

## 2017-01-23 DIAGNOSIS — M5136 Other intervertebral disc degeneration, lumbar region: Secondary | ICD-10-CM | POA: Diagnosis not present

## 2017-01-23 DIAGNOSIS — M170 Bilateral primary osteoarthritis of knee: Secondary | ICD-10-CM | POA: Diagnosis not present

## 2017-01-23 DIAGNOSIS — M4802 Spinal stenosis, cervical region: Secondary | ICD-10-CM | POA: Diagnosis not present

## 2017-01-23 DIAGNOSIS — M5010 Cervical disc disorder with radiculopathy, unspecified cervical region: Secondary | ICD-10-CM | POA: Diagnosis not present

## 2017-01-23 DIAGNOSIS — M47816 Spondylosis without myelopathy or radiculopathy, lumbar region: Secondary | ICD-10-CM | POA: Diagnosis not present

## 2017-01-23 DIAGNOSIS — E1136 Type 2 diabetes mellitus with diabetic cataract: Secondary | ICD-10-CM | POA: Diagnosis not present

## 2017-01-24 ENCOUNTER — Other Ambulatory Visit: Payer: Self-pay | Admitting: Physician Assistant

## 2017-01-24 DIAGNOSIS — J42 Unspecified chronic bronchitis: Secondary | ICD-10-CM

## 2017-01-25 DIAGNOSIS — E1136 Type 2 diabetes mellitus with diabetic cataract: Secondary | ICD-10-CM | POA: Diagnosis not present

## 2017-01-25 DIAGNOSIS — M5136 Other intervertebral disc degeneration, lumbar region: Secondary | ICD-10-CM | POA: Diagnosis not present

## 2017-01-25 DIAGNOSIS — M47816 Spondylosis without myelopathy or radiculopathy, lumbar region: Secondary | ICD-10-CM | POA: Diagnosis not present

## 2017-01-25 DIAGNOSIS — M5010 Cervical disc disorder with radiculopathy, unspecified cervical region: Secondary | ICD-10-CM | POA: Diagnosis not present

## 2017-01-25 DIAGNOSIS — M170 Bilateral primary osteoarthritis of knee: Secondary | ICD-10-CM | POA: Diagnosis not present

## 2017-01-25 DIAGNOSIS — M4802 Spinal stenosis, cervical region: Secondary | ICD-10-CM | POA: Diagnosis not present

## 2017-01-29 ENCOUNTER — Ambulatory Visit: Payer: Medicare Other | Admitting: Sports Medicine

## 2017-01-29 DIAGNOSIS — M4802 Spinal stenosis, cervical region: Secondary | ICD-10-CM | POA: Diagnosis not present

## 2017-01-29 DIAGNOSIS — M170 Bilateral primary osteoarthritis of knee: Secondary | ICD-10-CM | POA: Diagnosis not present

## 2017-01-29 DIAGNOSIS — M5136 Other intervertebral disc degeneration, lumbar region: Secondary | ICD-10-CM | POA: Diagnosis not present

## 2017-01-29 DIAGNOSIS — E1136 Type 2 diabetes mellitus with diabetic cataract: Secondary | ICD-10-CM | POA: Diagnosis not present

## 2017-01-29 DIAGNOSIS — M47816 Spondylosis without myelopathy or radiculopathy, lumbar region: Secondary | ICD-10-CM | POA: Diagnosis not present

## 2017-01-29 DIAGNOSIS — M5010 Cervical disc disorder with radiculopathy, unspecified cervical region: Secondary | ICD-10-CM | POA: Diagnosis not present

## 2017-01-30 ENCOUNTER — Ambulatory Visit (INDEPENDENT_AMBULATORY_CARE_PROVIDER_SITE_OTHER): Payer: Medicare Other | Admitting: Physician Assistant

## 2017-01-30 ENCOUNTER — Ambulatory Visit (INDEPENDENT_AMBULATORY_CARE_PROVIDER_SITE_OTHER): Payer: Medicare Other | Admitting: Sports Medicine

## 2017-01-30 ENCOUNTER — Encounter: Payer: Self-pay | Admitting: Physician Assistant

## 2017-01-30 ENCOUNTER — Telehealth: Payer: Self-pay | Admitting: Physician Assistant

## 2017-01-30 ENCOUNTER — Encounter: Payer: Self-pay | Admitting: Sports Medicine

## 2017-01-30 VITALS — BP 129/49 | HR 87 | Ht 65.0 in | Wt 272.0 lb

## 2017-01-30 DIAGNOSIS — E119 Type 2 diabetes mellitus without complications: Secondary | ICD-10-CM

## 2017-01-30 DIAGNOSIS — R809 Proteinuria, unspecified: Secondary | ICD-10-CM | POA: Diagnosis not present

## 2017-01-30 DIAGNOSIS — S4991XD Unspecified injury of right shoulder and upper arm, subsequent encounter: Secondary | ICD-10-CM | POA: Diagnosis not present

## 2017-01-30 DIAGNOSIS — I6523 Occlusion and stenosis of bilateral carotid arteries: Secondary | ICD-10-CM

## 2017-01-30 DIAGNOSIS — Z1231 Encounter for screening mammogram for malignant neoplasm of breast: Secondary | ICD-10-CM | POA: Diagnosis not present

## 2017-01-30 LAB — POCT UA - MICROALBUMIN
CREATININE, POC: 50 mg/dL
MICROALBUMIN (UR) POC: 30 mg/L

## 2017-01-30 LAB — POCT GLYCOSYLATED HEMOGLOBIN (HGB A1C): Hemoglobin A1C: 7.2

## 2017-01-30 MED ORDER — LORCASERIN HCL ER 20 MG PO TB24: 1.0000 | ORAL_TABLET | Freq: Every day | ORAL | 2 refills | Status: DC

## 2017-01-30 MED ORDER — METFORMIN HCL 500 MG PO TABS: 500.0000 mg | ORAL_TABLET | Freq: Two times a day (BID) | ORAL | 2 refills | Status: DC

## 2017-01-30 MED ORDER — LIRAGLUTIDE 18 MG/3ML ~~LOC~~ SOPN: PEN_INJECTOR | SUBCUTANEOUS | 5 refills | Status: DC

## 2017-01-30 NOTE — Telephone Encounter (Signed)
Can we find out where she got her DM eye exam and get copy, thanks.

## 2017-01-30 NOTE — Progress Notes (Addendum)
  Subjective:    CC: Follow-up  HPI: Returns, she's had a lumbar epidural at [facility] that has not yet started to work. She has persistent pain in her right shoulder, localized anteriorly, worse with forward flexion and abduction/external rotation. Physical therapy has only been minimally efficacious and she continues to have severe pain, desires interventional treatment today, pain is localized, no radiation.  Past medical history:  Negative.  See flowsheet/record as well for more information.  Surgical history: Negative.  See flowsheet/record as well for more information.  Family history: Negative.  See flowsheet/record as well for more information.  Social history: Negative.  See flowsheet/record as well for more information.  Allergies, and medications have been entered into the medical record, reviewed, and no changes needed.   Review of Systems: No fevers, chills, night sweats, weight loss, chest pain, or shortness of breath.   Objective:    General: Well Developed, well nourished, and in no acute distress.  Neuro: Alert and oriented x3, extra-ocular muscles intact, sensation grossly intact.  HEENT: Normocephalic, atraumatic, pupils equal round reactive to light, neck supple, no masses, no lymphadenopathy, thyroid nonpalpable.  Skin: Warm and dry, no rashes. Cardiac: Regular rate and rhythm, no murmurs rubs or gallops, no lower extremity edema.  Respiratory: Clear to auscultation bilaterally. Not using accessory muscles, speaking in full sentences. Right Shoulder: Inspection reveals no abnormalities, atrophy or asymmetry. Palpation is normal with no tenderness over AC joint or bicipital groove. ROM is full in all planes. Rotator cuff strength normal throughout. No signs of impingement with negative Neer and Hawkin's tests, empty can. Speeds and Yergason's tests both positive. Pain with abduction and external rotation Normal scapular function observed. No painful  arc and no drop arm sign. No apprehension sign  Procedure: Real-time Ultrasound Guided Injection of right glenohumeral joint Device: GE Logiq E  Verbal informed consent obtained.  Time-out conducted.  Noted no overlying erythema, induration, or other signs of local infection.  Skin prepped in a sterile fashion.  Local anesthesia: Topical Ethyl chloride.  With sterile technique and under real time ultrasound guidance:  Using a spinal needle advanced into the glenohumeral joint and injected 1 mL Kenalog 40, 2 mL lidocaine, 2 mL bupivacaine Completed without difficulty  Pain immediately resolved suggesting accurate placement of the medication.  Advised to call if fevers/chills, erythema, induration, drainage, or persistent bleeding.  Images permanently stored and available for review in the ultrasound unit.  Impression: Technically successful ultrasound guided injection.  Impression and Recommendations:    Right shoulder injury Pain is predominantly glenohumeral and bicipital, glenohumeral injection today. She did fail formal physical therapy. She does have some pain in the right anterior bicep, before further investigating cervical spine I do want to see the glenohumeral injection takes care of this, as the glenohumeral space does communicate with the long head of the biceps tendon sheath so the glenohumeral injection should serve as both. Return in a month. ___________________________________________ Gwen Her. Dianah Field, M.D., ABFM., CAQSM. Primary Care and [place] Instructor of [employer] [facility] [employer]

## 2017-01-30 NOTE — Assessment & Plan Note (Addendum)
Pain is predominantly glenohumeral and bicipital, glenohumeral injection today. She did fail formal physical therapy. She does have some pain in the right anterior bicep, before further investigating cervical spine I do want to see the glenohumeral injection takes care of this, as the glenohumeral space does communicate with the long head of the biceps tendon sheath so the glenohumeral injection should serve as both. Return in a month.

## 2017-01-30 NOTE — Progress Notes (Addendum)
Subjective:    Patient ID: Sarah Wright, female    DOB: Mar 05, 1946, 71 y.o.   MRN: [id number]  HPI The patient is a  Pleasant 71 two year old female who presents to the clinic today for a six month follow up of her type two diabetes. Her diabetes was previously well controlled. She is checking her sugars at home throughout the day. Her sugars are often in the 150's. She states she is taking her Lasix again daily. She is taking her Victoza daily. Denies any hypoglycemia. No open sores or wounds.   Eye exam was three weeks ago. She was told she has dry eyes. Colonoscopy scheduled for October 22nd with [facility].  Needs referral for mammogram.   Denies headaches, blurry vision, syncope, numbness/tingling.   She has not been exercising and states she has not been eating well. She says that her daughter has not been buying healthy food options. She has been eating muffins and rice more frequently. She says she would like to eat more vegetables and salads. She questions going back on phentermine.   .. Active Ambulatory Problems    Diagnosis Date Noted  . Anxiety and depression 03/16/2014  . Hyperlipidemia 03/16/2014  . COLD (chronic obstructive lung disease) ([place]) 03/16/2014  . Insomnia 03/16/2014  . Esophageal reflux 03/16/2014  . Bilateral hand pain 03/16/2014  . DDD (degenerative disc disease), lumbar 03/16/2014  . Lumbar spondylosis 03/17/2014  . Aortic atherosclerosis ([place]) 03/17/2014  . Chronic venous insufficiency 03/27/2014  . H/O colonoscopy 04/02/2014  . Thyroid disease 04/02/2014  . Cataract 04/02/2014  . Type 2 diabetes mellitus ([place]) 04/02/2014  . Primary osteoarthritis of both knees 07/21/2014  . Morbid obesity ([place]) 10/07/2014  . Dizziness 01/04/2015  . Sensorineural hearing loss of both ears 01/26/2015  . Thyroid nodule 06/22/2015  . Memory loss 07/29/2015  . Psoriasis 09/23/2015  . Right shoulder injury 01/27/2016  . Balance problem 08/13/2016  .  Left shoulder pain 08/13/2016  . Numbness around mouth 09/08/2016  . DDD (degenerative disc disease), cervical 12/29/2016   Resolved Ambulatory Problems    Diagnosis Date Noted  . No Resolved Ambulatory Problems   Past Medical History:  Diagnosis Date  . Diabetes mellitus without complication ([place])   . Hyperlipidemia   . Thyroid disease    .[name] Family History  Problem Relation Age of Onset  . Uterine cancer Unknown   . Pancreatic cancer Unknown   . Heart attack Father   . Diabetes Father   . Depression Mother   . Hypertension Unknown        parents     Review of Systems  All other systems reviewed and are negative.     Objective:   Physical Exam  Constitutional: She is oriented to person, place, and time. She appears well-developed and well-nourished.  HENT:  Head: Normocephalic.  Neurological: She is alert and oriented to person, place, and time.  Psychiatric: She has a normal mood and affect. Her behavior is normal.   Diabetic Foot Exam - Simple   Simple Foot Form Diabetic Foot exam was performed with the following findings:  Yes   Visual Inspection No deformities, no ulcerations, no other skin breakdown bilaterally:  Yes Sensation Testing Intact to touch and monofilament testing bilaterally:  Yes Pulse Check Comments Pt has no sensation in left heel and many calluses bilaterally.       Assessment & Plan:  [name] KitchenMarland KitchenLuanne was seen today for diabetes.  Diagnoses and all orders for this  visit:  Type 2 diabetes mellitus without complication, without long-term current use of insulin (HCC) -     POCT HgB A1C -     metFORMIN (GLUCOPHAGE) 500 MG tablet; Take 1 tablet (500 mg total) by mouth 2 (two) times daily with a meal. -     liraglutide (VICTOZA) 18 MG/3ML SOPN; 1.8 MG DAILY. Please include needles. -     POCT UA - Microalbumin  Visit for screening mammogram -     MM SCREENING BREAST TOMO BILATERAL  Morbid obesity ([place]) -     Lorcaserin HCl ER (BELVIQ XR) 20  MG TB24; Take 1 tablet by mouth daily.   Results for orders placed or performed in visit on 01/30/17  POCT HgB A1C  Result Value Ref Range   Hemoglobin A1C 7.2   POCT UA - Microalbumin  Result Value Ref Range   Microalbumin Ur, POC 30 mg/L   Creatinine, POC 50 mg/dL   Albumin/Creatinine Ratio, Urine, POC 30-300    A1c today was 7.2. She is currently on Victoza. Will add Metformin for better glucose control. Recheck A1c in 3 months. Diabetic foot exam performed today. Up to date with yearly eye exam.   Microalbumin abnormal range. Discussed adding lisinopril low dose. Will hold off today due to low BP. Will address at next visit.   Discussed her weight today and weight loss options. She has previously taken phentermine. Discussed with her that we cannot continue phentermine long term. Discussed long term options including Belviq. Will start Belviq today for long term weight loss. Discussed side effects.  [name].Discussed low carb diet with 1500 calories and 80g of protein.  Exercising at least 150 minutes a week.  [employer]itness Pal could be a Microbiologist.    Referral for mammogram placed. Colonoscopy scheduled for Oct 22nd with [facility].   Follow up in 3 months.   .Spent 30 minutes with patient and greater than 50 percent of visit spent counseling patient regarding treatment plan mostly around weight control and diabetic diet.

## 2017-01-30 NOTE — Addendum Note (Signed)
Addended by: Donella Stade on: 01/30/2017 01:13 PM   Modules accepted: Level of Service

## 2017-01-30 NOTE — Progress Notes (Deleted)
   Subjective:    Patient ID: Sarah Wright, female    DOB: 1945/09/12, 71 y.o.   MRN: [id number]  HPI    Review of Systems     Objective:   Physical Exam        Assessment & Plan:

## 2017-01-31 ENCOUNTER — Encounter: Payer: Self-pay | Admitting: Physician Assistant

## 2017-01-31 DIAGNOSIS — M542 Cervicalgia: Secondary | ICD-10-CM | POA: Diagnosis not present

## 2017-01-31 DIAGNOSIS — M5416 Radiculopathy, lumbar region: Secondary | ICD-10-CM | POA: Diagnosis not present

## 2017-01-31 DIAGNOSIS — M5136 Other intervertebral disc degeneration, lumbar region: Secondary | ICD-10-CM | POA: Diagnosis not present

## 2017-01-31 DIAGNOSIS — M48061 Spinal stenosis, lumbar region without neurogenic claudication: Secondary | ICD-10-CM | POA: Diagnosis not present

## 2017-02-01 DIAGNOSIS — M5010 Cervical disc disorder with radiculopathy, unspecified cervical region: Secondary | ICD-10-CM | POA: Diagnosis not present

## 2017-02-01 DIAGNOSIS — E1136 Type 2 diabetes mellitus with diabetic cataract: Secondary | ICD-10-CM | POA: Diagnosis not present

## 2017-02-01 DIAGNOSIS — M47816 Spondylosis without myelopathy or radiculopathy, lumbar region: Secondary | ICD-10-CM | POA: Diagnosis not present

## 2017-02-01 DIAGNOSIS — M5136 Other intervertebral disc degeneration, lumbar region: Secondary | ICD-10-CM | POA: Diagnosis not present

## 2017-02-01 DIAGNOSIS — M4802 Spinal stenosis, cervical region: Secondary | ICD-10-CM | POA: Diagnosis not present

## 2017-02-01 DIAGNOSIS — M170 Bilateral primary osteoarthritis of knee: Secondary | ICD-10-CM | POA: Diagnosis not present

## 2017-02-01 NOTE — Telephone Encounter (Signed)
[employer]. Form sent up front to fax

## 2017-02-02 ENCOUNTER — Telehealth: Payer: Self-pay | Admitting: *Deleted

## 2017-02-02 NOTE — Telephone Encounter (Signed)
Received PA for belviq xr. Those types of medications are not usually covered by medicare. Will send a prior auth to see but most likely will be denied   Denied through Medicare part D Case # [id number]. Pharmacist forwarded this through commercial plan

## 2017-02-12 ENCOUNTER — Encounter: Payer: Self-pay | Admitting: Physician Assistant

## 2017-02-12 DIAGNOSIS — D122 Benign neoplasm of ascending colon: Secondary | ICD-10-CM | POA: Diagnosis not present

## 2017-02-12 DIAGNOSIS — K5730 Diverticulosis of large intestine without perforation or abscess without bleeding: Secondary | ICD-10-CM | POA: Diagnosis not present

## 2017-02-12 DIAGNOSIS — K621 Rectal polyp: Secondary | ICD-10-CM | POA: Diagnosis not present

## 2017-02-12 DIAGNOSIS — D123 Benign neoplasm of transverse colon: Secondary | ICD-10-CM | POA: Diagnosis not present

## 2017-02-12 DIAGNOSIS — Z86010 Personal history of colonic polyps: Secondary | ICD-10-CM | POA: Diagnosis not present

## 2017-02-12 LAB — HM COLONOSCOPY

## 2017-02-13 ENCOUNTER — Other Ambulatory Visit: Payer: Self-pay | Admitting: Physician Assistant

## 2017-02-14 ENCOUNTER — Other Ambulatory Visit: Payer: Self-pay | Admitting: Physician Assistant

## 2017-02-14 MED ORDER — BUSPIRONE HCL 5 MG PO TABS
5.0000 mg | ORAL_TABLET | Freq: Two times a day (BID) | ORAL | 1 refills | Status: DC
Start: 2017-02-14 — End: 2017-04-02

## 2017-02-14 MED ORDER — HYDROXYZINE PAMOATE 25 MG PO CAPS: 25.0000 mg | ORAL_CAPSULE | Freq: Three times a day (TID) | ORAL | 1 refills | Status: DC | PRN

## 2017-02-14 NOTE — Progress Notes (Signed)
bu

## 2017-02-15 ENCOUNTER — Telehealth: Payer: Self-pay | Admitting: *Deleted

## 2017-02-15 NOTE — Telephone Encounter (Signed)
Pre Authorization sent to cover my meds.[id number]

## 2017-02-21 ENCOUNTER — Other Ambulatory Visit: Payer: Self-pay | Admitting: *Deleted

## 2017-02-21 ENCOUNTER — Other Ambulatory Visit: Payer: Self-pay | Admitting: Physician Assistant

## 2017-02-21 DIAGNOSIS — J42 Unspecified chronic bronchitis: Secondary | ICD-10-CM

## 2017-02-21 MED ORDER — LEVOCETIRIZINE DIHYDROCHLORIDE 5 MG PO TABS: 5.0000 mg | ORAL_TABLET | Freq: Every evening | ORAL | 3 refills | Status: DC

## 2017-02-21 NOTE — Telephone Encounter (Signed)
She needs to call pharmacy and see what the cost is?

## 2017-02-21 NOTE — Telephone Encounter (Signed)
Hydroxyzine w.as denied. Probably not very expensive.

## 2017-02-21 NOTE — Telephone Encounter (Signed)
Patient paid out of pocket for medication.

## 2017-02-26 ENCOUNTER — Encounter: Payer: Self-pay | Admitting: Physician Assistant

## 2017-02-27 ENCOUNTER — Other Ambulatory Visit: Payer: Self-pay | Admitting: *Deleted

## 2017-02-27 DIAGNOSIS — E119 Type 2 diabetes mellitus without complications: Secondary | ICD-10-CM

## 2017-02-27 MED ORDER — METFORMIN HCL 500 MG PO TABS: 500.0000 mg | ORAL_TABLET | Freq: Two times a day (BID) | ORAL | 0 refills | Status: DC

## 2017-02-28 DIAGNOSIS — M48061 Spinal stenosis, lumbar region without neurogenic claudication: Secondary | ICD-10-CM | POA: Diagnosis not present

## 2017-02-28 DIAGNOSIS — M5416 Radiculopathy, lumbar region: Secondary | ICD-10-CM | POA: Diagnosis not present

## 2017-02-28 DIAGNOSIS — M5136 Other intervertebral disc degeneration, lumbar region: Secondary | ICD-10-CM | POA: Diagnosis not present

## 2017-02-28 DIAGNOSIS — M542 Cervicalgia: Secondary | ICD-10-CM | POA: Diagnosis not present

## 2017-03-02 ENCOUNTER — Ambulatory Visit (INDEPENDENT_AMBULATORY_CARE_PROVIDER_SITE_OTHER): Payer: Medicare Other | Admitting: Sports Medicine

## 2017-03-02 ENCOUNTER — Encounter: Payer: Self-pay | Admitting: Sports Medicine

## 2017-03-02 DIAGNOSIS — I6523 Occlusion and stenosis of bilateral carotid arteries: Secondary | ICD-10-CM

## 2017-03-02 DIAGNOSIS — M170 Bilateral primary osteoarthritis of knee: Secondary | ICD-10-CM

## 2017-03-02 NOTE — Assessment & Plan Note (Signed)
Pain resolved after glenohumeral injection at the last visit.

## 2017-03-02 NOTE — Progress Notes (Signed)
  Subjective:    CC: Left knee pain  HPI: This is a pleasant 71 year old female, we injected her shoulder at the last visit which is doing fantastic, she also has bilateral knee osteoarthritis, last injected about 2 years ago, she is now having an increase in pain and swelling in the left knee with tripping and falling almost.  Moderate, persistent, localized to the medial joint line without radiation.  Past medical history:  Negative.  See flowsheet/record as well for more information.  Surgical history: Negative.  See flowsheet/record as well for more information.  Family history: Negative.  See flowsheet/record as well for more information.  Social history: Negative.  See flowsheet/record as well for more information.  Allergies, and medications have been entered into the medical record, reviewed, and no changes needed.   Review of Systems: No fevers, chills, night sweats, weight loss, chest pain, or shortness of breath.   Objective:    General: Well Developed, well nourished, and in no acute distress.  Neuro: Alert and oriented x3, extra-ocular muscles intact, sensation grossly intact.  HEENT: Normocephalic, atraumatic, pupils equal round reactive to light, neck supple, no masses, no lymphadenopathy, thyroid nonpalpable.  Skin: Warm and dry, no rashes. Cardiac: Regular rate and rhythm, no murmurs rubs or gallops, no lower extremity edema.  Respiratory: Clear to auscultation bilaterally. Not using accessory muscles, speaking in full sentences. Left knee: Normal to inspection with no erythema or effusion or obvious bony abnormalities. Severe tenderness at the medial joint line ROM normal in flexion and extension and lower leg rotation. Ligaments with solid consistent endpoints including ACL, PCL, LCL, MCL. Negative Mcmurray's and provocative meniscal tests. Non painful patellar compression. Patellar and quadriceps tendons unremarkable. Hamstring and quadriceps strength is  normal.  Procedure: Real-time Ultrasound Guided Injection of left knee Device: GE Logiq E  Verbal informed consent obtained.  Time-out conducted.  Noted no overlying erythema, induration, or other signs of local infection.  Skin prepped in a sterile fashion.  Local anesthesia: Topical Ethyl chloride.  With sterile technique and under real time ultrasound guidance: Using a 25-gauge needle I injected 1 cc kenalog 40, 2 cc lidocaine, 2 cc bupivacaine. Completed without difficulty  Pain immediately resolved suggesting accurate placement of the medication.  Advised to call if fevers/chills, erythema, induration, drainage, or persistent bleeding.  Images permanently stored and available for review in the ultrasound unit.  Impression: Technically successful ultrasound guided injection.  Impression and Recommendations:    Right shoulder injury Pain resolved after glenohumeral injection at the last visit.  Primary osteoarthritis of both knees 2-year response to previous injection, now having increasing pain and swelling in the left knee, repeat left knee injection, return in 1 month. She has been having some increasing tripping and near falls. ___________________________________________ Gwen Her. Dianah Field, M.D., ABFM., CAQSM. Primary Care and [place] Instructor of [employer] [facility] [employer]

## 2017-03-02 NOTE — Assessment & Plan Note (Signed)
2-year response to previous injection, now having increasing pain and swelling in the left knee, repeat left knee injection, return in 1 month. She has been having some increasing tripping and near falls.

## 2017-03-05 ENCOUNTER — Encounter: Payer: Self-pay | Admitting: Physician Assistant

## 2017-03-05 DIAGNOSIS — L603 Nail dystrophy: Secondary | ICD-10-CM | POA: Diagnosis not present

## 2017-03-05 DIAGNOSIS — I739 Peripheral vascular disease, unspecified: Secondary | ICD-10-CM | POA: Diagnosis not present

## 2017-03-05 DIAGNOSIS — E1142 Type 2 diabetes mellitus with diabetic polyneuropathy: Secondary | ICD-10-CM | POA: Diagnosis not present

## 2017-03-05 DIAGNOSIS — L84 Corns and callosities: Secondary | ICD-10-CM | POA: Diagnosis not present

## 2017-03-07 ENCOUNTER — Other Ambulatory Visit: Payer: Self-pay | Admitting: *Deleted

## 2017-03-07 DIAGNOSIS — F419 Anxiety disorder, unspecified: Principal | ICD-10-CM

## 2017-03-07 DIAGNOSIS — F329 Major depressive disorder, single episode, unspecified: Secondary | ICD-10-CM

## 2017-03-07 DIAGNOSIS — F32A Depression, unspecified: Secondary | ICD-10-CM

## 2017-03-07 DIAGNOSIS — M25512 Pain in left shoulder: Secondary | ICD-10-CM

## 2017-03-07 MED ORDER — CITALOPRAM HYDROBROMIDE 40 MG PO TABS: 40.0000 mg | ORAL_TABLET | Freq: Every day | ORAL | 0 refills | Status: DC

## 2017-03-07 MED ORDER — BUPROPION HCL ER (XL) 150 MG PO TB24: ORAL_TABLET | ORAL | 0 refills | Status: DC

## 2017-03-07 MED ORDER — MELOXICAM 15 MG PO TABS: 15.0000 mg | ORAL_TABLET | Freq: Every day | ORAL | 0 refills | Status: DC

## 2017-03-14 ENCOUNTER — Encounter: Payer: Self-pay | Admitting: Physician Assistant

## 2017-03-30 ENCOUNTER — Ambulatory Visit: Payer: Medicare Other | Admitting: Sports Medicine

## 2017-04-01 ENCOUNTER — Encounter: Payer: Self-pay | Admitting: Physician Assistant

## 2017-04-02 ENCOUNTER — Other Ambulatory Visit: Payer: Self-pay | Admitting: Physician Assistant

## 2017-04-03 ENCOUNTER — Encounter: Payer: Self-pay | Admitting: Physician Assistant

## 2017-04-03 ENCOUNTER — Other Ambulatory Visit: Payer: Self-pay | Admitting: Physician Assistant

## 2017-04-06 ENCOUNTER — Encounter: Payer: Self-pay | Admitting: Physician Assistant

## 2017-04-18 DIAGNOSIS — M5416 Radiculopathy, lumbar region: Secondary | ICD-10-CM | POA: Diagnosis not present

## 2017-04-18 DIAGNOSIS — M542 Cervicalgia: Secondary | ICD-10-CM | POA: Diagnosis not present

## 2017-04-18 DIAGNOSIS — M5136 Other intervertebral disc degeneration, lumbar region: Secondary | ICD-10-CM | POA: Diagnosis not present

## 2017-04-18 DIAGNOSIS — M48061 Spinal stenosis, lumbar region without neurogenic claudication: Secondary | ICD-10-CM | POA: Diagnosis not present

## 2017-04-30 ENCOUNTER — Encounter: Payer: Self-pay | Admitting: Physician Assistant

## 2017-05-01 ENCOUNTER — Other Ambulatory Visit: Payer: Self-pay | Admitting: Physician Assistant

## 2017-05-01 MED ORDER — BUSPIRONE HCL 10 MG PO TABS: 10.0000 mg | ORAL_TABLET | Freq: Three times a day (TID) | ORAL | 1 refills | Status: DC

## 2017-05-02 ENCOUNTER — Encounter: Payer: Self-pay | Admitting: Physician Assistant

## 2017-05-02 ENCOUNTER — Ambulatory Visit (INDEPENDENT_AMBULATORY_CARE_PROVIDER_SITE_OTHER): Payer: Medicare Other | Admitting: Physician Assistant

## 2017-05-02 VITALS — BP 111/52 | HR 71 | Temp 98.8°F | Resp 16 | Wt 268.7 lb

## 2017-05-02 DIAGNOSIS — F329 Major depressive disorder, single episode, unspecified: Secondary | ICD-10-CM

## 2017-05-02 DIAGNOSIS — M25512 Pain in left shoulder: Secondary | ICD-10-CM | POA: Diagnosis not present

## 2017-05-02 DIAGNOSIS — F419 Anxiety disorder, unspecified: Secondary | ICD-10-CM

## 2017-05-02 DIAGNOSIS — G894 Chronic pain syndrome: Secondary | ICD-10-CM | POA: Diagnosis not present

## 2017-05-02 DIAGNOSIS — F32A Depression, unspecified: Secondary | ICD-10-CM

## 2017-05-02 DIAGNOSIS — R413 Other amnesia: Secondary | ICD-10-CM | POA: Diagnosis not present

## 2017-05-02 DIAGNOSIS — E119 Type 2 diabetes mellitus without complications: Secondary | ICD-10-CM | POA: Diagnosis not present

## 2017-05-02 LAB — POCT GLYCOSYLATED HEMOGLOBIN (HGB A1C): Hemoglobin A1C: 7.1

## 2017-05-02 MED ORDER — TOPIRAMATE 50 MG PO TABS: ORAL_TABLET | ORAL | 2 refills | Status: DC

## 2017-05-02 MED ORDER — DONEPEZIL HCL 10 MG PO TABS: ORAL_TABLET | ORAL | 1 refills | Status: DC

## 2017-05-02 MED ORDER — MELOXICAM 15 MG PO TABS: 15.0000 mg | ORAL_TABLET | Freq: Every day | ORAL | 1 refills | Status: DC

## 2017-05-02 MED ORDER — DULOXETINE HCL 30 MG PO CPEP: ORAL_CAPSULE | ORAL | 1 refills | Status: DC

## 2017-05-02 MED ORDER — OLANZAPINE 5 MG PO TABS: 5.0000 mg | ORAL_TABLET | Freq: Every day | ORAL | 1 refills | Status: DC

## 2017-05-02 MED ORDER — SEMAGLUTIDE(0.25 OR 0.5MG/DOS) 2 MG/1.5ML ~~LOC~~ SOPN: 0.2500 mg | PEN_INJECTOR | SUBCUTANEOUS | 0 refills | Status: DC

## 2017-05-02 MED ORDER — BUSPIRONE HCL 7.5 MG PO TABS
7.5000 mg | ORAL_TABLET | Freq: Three times a day (TID) | ORAL | 1 refills | Status: DC
Start: 2017-05-02 — End: 2017-10-22

## 2017-05-02 NOTE — Patient Instructions (Addendum)
Stop celexa. 1/2 tablet for 7 days then stop.  Will start topamax twice a day for weight loss. Taper up. See instructions.  Follow up in 3 months.

## 2017-05-02 NOTE — Progress Notes (Signed)
Subjective:    Patient ID: Sarah Wright, female    DOB: 10/16/1945, 72 y.o.   MRN: [id number]  HPI  Pt is a 72 yo female obese with type II DM, anxiety, depression and chronic pain who presents to the clinic for 3 month DM follow up.   Pt sees pain clinic and at there suggestion is off xanax. She does not feel controlled with pain at this time. Overall she likes how non-dependent she feels off xanax. Her anxiety and depression still continue to be a problem. Pharmacy does not have 10mg  buspar in stock. They do carry 7.5mg  she needs me to adjust dose today. She has been on celexa for years and interested in trying something new. She feels very unmotivated.    DM- she is not checking her sugars. Denies any open wounds or sores.  No hypoglycemia. Not able to tolerate metformin. She is taking victoza.   She continue to want to lose weight. belviq was too expensive. She is already on GLP-1.   .. Active Ambulatory Problems    Diagnosis Date Noted  . Anxiety and depression 03/16/2014  . Hyperlipidemia 03/16/2014  . COLD (chronic obstructive lung disease) ([place]) 03/16/2014  . Insomnia 03/16/2014  . Esophageal reflux 03/16/2014  . Bilateral hand pain 03/16/2014  . DDD (degenerative disc disease), lumbar 03/16/2014  . Lumbar spondylosis 03/17/2014  . Aortic atherosclerosis ([place]) 03/17/2014  . Chronic venous insufficiency 03/27/2014  . H/O colonoscopy 04/02/2014  . Thyroid disease 04/02/2014  . Cataract 04/02/2014  . Type 2 diabetes mellitus ([place]) 04/02/2014  . Primary osteoarthritis of both knees 07/21/2014  . Morbid obesity ([place]) 10/07/2014  . Dizziness 01/04/2015  . Sensorineural hearing loss of both ears 01/26/2015  . Thyroid nodule 06/22/2015  . Memory loss 07/29/2015  . Psoriasis 09/23/2015  . Right shoulder injury 01/27/2016  . Balance problem 08/13/2016  . Left shoulder pain 08/13/2016  . Numbness around mouth 09/08/2016  . DDD (degenerative disc disease), cervical 12/29/2016   . Microalbuminuria 01/30/2017   Resolved Ambulatory Problems    Diagnosis Date Noted  . No Resolved Ambulatory Problems   Past Medical History:  Diagnosis Date  . Diabetes mellitus without complication ([place])   . Hyperlipidemia   . Thyroid disease       victoza daily.  Review of Systems     Objective:   Physical Exam  Constitutional: She is oriented to person, place, and time. She appears well-developed and well-nourished.  Obese.   HENT:  Head: Normocephalic and atraumatic.  Neck: Normal range of motion. Neck supple.  Cardiovascular: Normal rate, regular rhythm and normal heart sounds.  Pulmonary/Chest: Effort normal and breath sounds normal. She has no wheezes.  Lymphadenopathy:    She has no cervical adenopathy.  Neurological: She is alert and oriented to person, place, and time.  Psychiatric: She has a normal mood and affect. Her behavior is normal.  flate affect.           Assessment & Plan:  [name] KitchenMarland KitchenLuanne was seen today for follow-up.  Diagnoses and all orders for this visit:  Type 2 diabetes mellitus without complication, without long-term current use of insulin (HCC) -     POCT glycosylated hemoglobin (Hb A1C)  Anxiety and depression -     DULoxetine (CYMBALTA) 30 MG capsule; Take one tablet for 7 days then increase to 2 tablets daily. -     busPIRone (BUSPAR) 7.5 MG tablet; Take 1 tablet (7.5 mg total) by mouth 3 (three) times daily. -  OLANZapine (ZYPREXA) 5 MG tablet; Take 1 tablet (5 mg total) by mouth at bedtime.  Left shoulder pain, unspecified chronicity -     meloxicam (MOBIC) 15 MG tablet; Take 1 tablet (15 mg total) by mouth daily.  Memory loss -     donepezil (ARICEPT) 10 MG tablet; TAKE ONE TABLET (5 MG TOTAL) BY MOUTH AT BEDTIME. TO HELP PRESERVE MEMORY.  Chronic pain syndrome -     DULoxetine (CYMBALTA) 30 MG capsule; Take one tablet for 7 days then increase to 2 tablets daily.  Morbid obesity (HCC) -     topiramate (TOPAMAX) 50 MG  tablet; One half tab by mouth daily for a week, then one tab by mouth daily for 1 week, then increase to twice daily.   .. Depression screen [facility] 2/9 05/02/2017 09/26/2016 08/11/2016 07/26/2016  Decreased Interest 0 2 1 2   Down, Depressed, Hopeless 1 2 2 3   PHQ - 2 Score 1 4 3 5   Altered sleeping 1 1 2 2   Tired, decreased energy 1 1 3 3   Change in appetite 0 1 3 3   Feeling bad or failure about yourself  2 1 3 3   Trouble concentrating 0 1 2 2   Moving slowly or fidgety/restless 0 0 2 2  Suicidal thoughts 0 1 1 0  PHQ-9 Score 5 10 19 20   Difficult doing work/chores Not difficult at all - - -   Stop celexa. Start cymbalta. Discussed tapering down. I think this could help with pain and mood.  buspar changed to 7.5mg  up to three times a day instead of 10mg  bid.  Follow up in 3 months.   .. Lab Results  Component Value Date   HGBA1C 7.1 05/02/2017   Will switch victoza to ozempic. Better a1c control and better weight loss.  On statin.  BP controlled.   Added topamax to help with weight loss. Continue to work on diet and staying active.   [name].Spent 30 minutes with patient and greater than 50 percent of visit spent counseling patient regarding treatment plan.

## 2017-05-06 ENCOUNTER — Encounter: Payer: Self-pay | Admitting: Physician Assistant

## 2017-05-06 DIAGNOSIS — G894 Chronic pain syndrome: Secondary | ICD-10-CM | POA: Insufficient documentation

## 2017-05-21 ENCOUNTER — Encounter: Payer: Self-pay | Admitting: Physician Assistant

## 2017-05-21 DIAGNOSIS — G894 Chronic pain syndrome: Secondary | ICD-10-CM

## 2017-05-21 DIAGNOSIS — F419 Anxiety disorder, unspecified: Principal | ICD-10-CM

## 2017-05-21 DIAGNOSIS — F329 Major depressive disorder, single episode, unspecified: Secondary | ICD-10-CM

## 2017-05-21 DIAGNOSIS — F32A Depression, unspecified: Secondary | ICD-10-CM

## 2017-05-21 MED ORDER — DULOXETINE HCL 30 MG PO CPEP
ORAL_CAPSULE | ORAL | 1 refills | Status: DC
Start: 2017-05-21 — End: 2017-08-14

## 2017-05-23 DIAGNOSIS — D2371 Other benign neoplasm of skin of right lower limb, including hip: Secondary | ICD-10-CM | POA: Diagnosis not present

## 2017-05-23 DIAGNOSIS — M21611 Bunion of right foot: Secondary | ICD-10-CM | POA: Diagnosis not present

## 2017-05-28 ENCOUNTER — Other Ambulatory Visit: Payer: Self-pay | Admitting: Physician Assistant

## 2017-05-28 DIAGNOSIS — E119 Type 2 diabetes mellitus without complications: Secondary | ICD-10-CM

## 2017-05-29 DIAGNOSIS — Z5181 Encounter for therapeutic drug level monitoring: Secondary | ICD-10-CM | POA: Diagnosis not present

## 2017-05-29 DIAGNOSIS — Z79899 Other long term (current) drug therapy: Secondary | ICD-10-CM | POA: Diagnosis not present

## 2017-05-29 DIAGNOSIS — M5136 Other intervertebral disc degeneration, lumbar region: Secondary | ICD-10-CM | POA: Diagnosis not present

## 2017-05-29 DIAGNOSIS — M5416 Radiculopathy, lumbar region: Secondary | ICD-10-CM | POA: Diagnosis not present

## 2017-05-29 DIAGNOSIS — M48061 Spinal stenosis, lumbar region without neurogenic claudication: Secondary | ICD-10-CM | POA: Diagnosis not present

## 2017-05-29 DIAGNOSIS — M542 Cervicalgia: Secondary | ICD-10-CM | POA: Diagnosis not present

## 2017-06-01 ENCOUNTER — Other Ambulatory Visit: Payer: Self-pay | Admitting: *Deleted

## 2017-06-01 DIAGNOSIS — E7849 Other hyperlipidemia: Secondary | ICD-10-CM

## 2017-06-01 MED ORDER — ATORVASTATIN CALCIUM 20 MG PO TABS: 20.0000 mg | ORAL_TABLET | Freq: Every day | ORAL | 0 refills | Status: DC

## 2017-06-04 ENCOUNTER — Other Ambulatory Visit: Payer: Self-pay | Admitting: Physician Assistant

## 2017-06-04 DIAGNOSIS — F32A Depression, unspecified: Secondary | ICD-10-CM

## 2017-06-04 DIAGNOSIS — F329 Major depressive disorder, single episode, unspecified: Secondary | ICD-10-CM

## 2017-06-04 DIAGNOSIS — F419 Anxiety disorder, unspecified: Principal | ICD-10-CM

## 2017-06-05 ENCOUNTER — Encounter: Payer: Self-pay | Admitting: Family Medicine

## 2017-06-05 ENCOUNTER — Ambulatory Visit (INDEPENDENT_AMBULATORY_CARE_PROVIDER_SITE_OTHER): Payer: Medicare Other | Admitting: Family Medicine

## 2017-06-05 VITALS — BP 140/81 | HR 82 | Temp 98.3°F | Wt 264.0 lb

## 2017-06-05 DIAGNOSIS — R6889 Other general symptoms and signs: Secondary | ICD-10-CM

## 2017-06-05 LAB — POCT INFLUENZA A/B
Influenza A, POC: NEGATIVE
Influenza B, POC: NEGATIVE

## 2017-06-05 MED ORDER — PREDNISONE 10 MG PO TABS: 30.0000 mg | ORAL_TABLET | Freq: Every day | ORAL | 0 refills | Status: DC

## 2017-06-05 MED ORDER — BENZONATATE 200 MG PO CAPS: 200.0000 mg | ORAL_CAPSULE | Freq: Three times a day (TID) | ORAL | 0 refills | Status: DC | PRN

## 2017-06-05 MED ORDER — AZITHROMYCIN 250 MG PO TABS: 250.0000 mg | ORAL_TABLET | Freq: Every day | ORAL | 0 refills | Status: DC

## 2017-06-05 MED ORDER — GUAIFENESIN-CODEINE 100-10 MG/5ML PO SOLN: 5.0000 mL | Freq: Every evening | ORAL | 0 refills | Status: DC | PRN

## 2017-06-05 NOTE — Patient Instructions (Signed)
Thank you for coming in today. Take the tessalon pearles and codeine cough medicine as needed.  Continue the over the counter medicine.  Take the prednisone and antibiotics if this starts to feel like bronchitis or COPD attack.  Recheck as needed.  Call or go to the emergency room if you get worse, have trouble breathing, have chest pains, or palpitations.    Upper Respiratory Infection, Adult Most upper respiratory infections (URIs) are caused by a virus. A URI affects the nose, throat, and upper air passages. The most common type of URI is often called "the common cold." Follow these instructions at home:  Take medicines only as told by your doctor.  Gargle warm saltwater or take cough drops to comfort your throat as told by your doctor.  Use a warm mist humidifier or inhale steam from a shower to increase air moisture. This may make it easier to breathe.  Drink enough fluid to keep your pee (urine) clear or pale yellow.  Eat soups and other clear broths.  Have a healthy diet.  Rest as needed.  Go back to work when your fever is gone or your doctor says it is okay. ? You may need to stay home longer to avoid giving your URI to others. ? You can also wear a face mask and wash your hands often to prevent spread of the virus.  Use your inhaler more if you have asthma.  Do not use any tobacco products, including cigarettes, chewing tobacco, or electronic cigarettes. If you need help quitting, ask your doctor. Contact a doctor if:  You are getting worse, not better.  Your symptoms are not helped by medicine.  You have chills.  You are getting more short of breath.  You have brown or red mucus.  You have yellow or brown discharge from your nose.  You have pain in your face, especially when you bend forward.  You have a fever.  You have puffy (swollen) neck glands.  You have pain while swallowing.  You have white areas in the back of your throat. Get help right away  if:  You have very bad or constant: ? Headache. ? Ear pain. ? Pain in your forehead, behind your eyes, and over your cheekbones (sinus pain). ? Chest pain.  You have long-lasting (chronic) lung disease and any of the following: ? Wheezing. ? Long-lasting cough. ? Coughing up blood. ? A change in your usual mucus.  You have a stiff neck.  You have changes in your: ? Vision. ? Hearing. ? Thinking. ? Mood. This information is not intended to replace advice given to you by your health care provider. Make sure you discuss any questions you have with your health care provider. Document Released: 09/27/2007 Document Revised: 12/12/2015 Document Reviewed: 07/16/2013 Elsevier Interactive Patient Education  2018 [employer].

## 2017-06-05 NOTE — Progress Notes (Signed)
Sarah Wright is a 72 y.o. female who presents to [facility] today for cough, sore throat, body aches.  Patient with 2 day history of worsening illness. Patient started to experience body aches and chills 2 days ago. Felt worse yesterday with body aches, chills, headache, cough, congestion. Patient states that cough is nonproductive but bothersome. Noting sore throat today as well. Patient has not measured a fever at home. She denies shortness of breath. States that this does not feel like a typical COPD exacerbation to her. Also denies GI symptoms including diarrhea.   Patient with COPD but denying increasing shortness of breath. She has been using her inhalers as prescribed, without need for increased albuterol.   Patient lives at home with granddaughter who has NF1 and is on chemotherapy. Patient concerned that she could pass illness on to granddaughter.   Past Medical History:  Diagnosis Date  . Diabetes mellitus without complication ([place])   . Hyperlipidemia   . Thyroid disease    Past Surgical History:  Procedure Laterality Date  . ABDOMINAL HYSTERECTOMY    . CARPAL TUNNEL RELEASE     both wrists  . CATARACT EXTRACTION, BILATERAL    . CERVICAL FUSION    . GALLBLADDER SURGERY    . right foot surgery    . THYROIDECTOMY, PARTIAL     right side removed   Social History   Tobacco Use  . Smoking status: Former Smoker    Last attempt to quit: 02/12/2014    Years since quitting: 3.3  . Smokeless tobacco: Never Used  Substance Use Topics  . Alcohol use: No    Alcohol/week: 0.0 oz   family history includes Depression in her mother; Diabetes in her father; Heart attack in her father; Hypertension in her unknown relative; Pancreatic cancer in her unknown relative; Uterine cancer in her unknown relative.  ROS as above:  Medications: Current Outpatient  Medications  Medication Sig Dispense Refill  . albuterol (PROVENTIL HFA;VENTOLIN HFA) 108 (90 Base) MCG/ACT inhaler Inhale 2 puffs into the lungs every 6 (six) hours as needed for wheezing or shortness of breath. 1 Inhaler 2  . AMBULATORY NON FORMULARY MEDICATION Knee-high, medium compression, graduated compression stockings. Apply to lower extremities. [email or web address], Zippered Compression Stockings, medium circ, long length 1 each 0  . aspirin 81 MG tablet Take 81 mg by mouth daily.    [name] atorvastatin (LIPITOR) 20 MG tablet Take 1 tablet (20 mg total) by mouth daily. Need fasting lab work for future refills. 90 tablet 0  . BD PEN NEEDLE NANO U/F 32G X 4 MM MISC USE WITH VICTOZA ONCE DAILY 100 each 2  . budesonide-formoterol (SYMBICORT) 160-4.5 MCG/ACT inhaler Inhale 2 puffs into the lungs 2 (two) times daily. 10.2 Inhaler 11  . buPROPion (WELLBUTRIN XL) 150 MG 24 hr tablet TAKE 2 TABLETS BY MOUTH EVERY DAY IN THE MORNING 180 tablet 0  . busPIRone (BUSPAR) 7.5 MG tablet Take 1 tablet (7.5 mg total) by mouth 3 (three) times daily. 270 tablet 1  . calcipotriene (DOVONOX) 0.005 % cream Apply 1-2 grams to affected area 1-2 times daily. 360 g 1  . donepezil (ARICEPT) 10 MG tablet TAKE ONE TABLET (5 MG TOTAL) BY MOUTH AT BEDTIME. TO HELP PRESERVE MEMORY. 90 tablet 1  . DULoxetine (CYMBALTA) 30 MG capsule Take one tablet for 7 days then increase to 2 tablets daily. 60 capsule 1  . furosemide (LASIX) 20 MG tablet TAKE  ONE TABLET BY MOUTH DAILY AS NEEDED FOR ANKLE SWELLING 90 tablet 0  . levocetirizine (XYZAL) 5 MG tablet Take 1 tablet (5 mg total) by mouth every evening. 90 tablet 3  . lubiprostone (AMITIZA) 8 MCG capsule Take 8 mcg by mouth daily with breakfast.    . meloxicam (MOBIC) 15 MG tablet Take 1 tablet (15 mg total) by mouth daily. 90 tablet 1  . montelukast (SINGULAIR) 10 MG tablet Take 1 tablet (10 mg total) by mouth at bedtime. 90 tablet 4  . OLANZapine (ZYPREXA) 5 MG tablet Take 1  tablet (5 mg total) by mouth at bedtime. 90 tablet 1  . Oxycodone HCl 10 MG TABS TAKE 1 TABELT BY MOUTH EVERY 6 HOURS FOR 30 DAYS  0  . Semaglutide (OZEMPIC) 0.25 or 0.5 MG/DOSE SOPN Inject 0.25 mg into the skin once a week. For 4 weeks then increase to .5mg  for 4 weeks. 1 pen 0  . tiotropium (SPIRIVA HANDIHALER) 18 MCG inhalation capsule INHALE ONE CAPSULE VIA HANDIHALER ONE TIME DAILY 30 capsule 11  . topiramate (TOPAMAX) 50 MG tablet One half tab by mouth daily for a week, then one tab by mouth daily for 1 week, then increase to twice daily. 60 tablet 2  . azithromycin (ZITHROMAX) 250 MG tablet Take 1 tablet (250 mg total) by mouth daily. Take first 2 tablets together, then 1 every day until finished. 6 tablet 0  . benzonatate (TESSALON) 200 MG capsule Take 1 capsule (200 mg total) by mouth 3 (three) times daily as needed for cough. 45 capsule 0  . guaiFENesin-codeine 100-10 MG/5ML syrup Take 5 mLs by mouth at bedtime as needed for cough. 120 mL 0  . predniSONE (DELTASONE) 10 MG tablet Take 3 tablets (30 mg total) by mouth daily with breakfast. 15 tablet 0   No current facility-administered medications for this visit.    Allergies  Allergen Reactions  . Sulfa Antibiotics     Patient had reaction as a child; does not know what type of reaction  . Vistaril [Hydroxyzine Hcl]     Per patient she had increased itching with medication.     Health Maintenance Health Maintenance  Topic Date Due  . MAMMOGRAM  10/11/2016  . HEMOGLOBIN A1C  10/30/2017  . OPHTHALMOLOGY EXAM  01/10/2018  . FOOT EXAM  01/30/2018  . URINE MICROALBUMIN  01/30/2018  . TETANUS/TDAP  10/03/2025  . COLONOSCOPY  02/13/2027  . INFLUENZA VACCINE  Completed  . DEXA SCAN  Completed  . Hepatitis C Screening  Completed  . PNA vac Low Risk Adult  Completed   Exam:  BP 140/81   Pulse 82   Temp 98.3 F (36.8 C) (Oral)   Wt 264 lb (119.7 kg)   SpO2 95%   BMI 43.93 kg/m  Gen: Nontoxic. HEENT: EOMI,  MMM. TMs clear.  Oropharynx erythematous but without exudate/petechiae. Tonsillar stones noted. No cervical lymphadenopathy.  Lungs: Normal work of breathing. No crackles or wheezes.  Heart: RRR no MRG Abd: NABS, Soft. Nondistended, Nontender Exts: Brisk capillary refill, warm and well perfused.   Results for orders placed or performed in visit on 06/05/17 (from the past 72 hour(s))  POCT Influenza A/B     Status: None   Collection Time: 06/05/17  4:13 PM  Result Value Ref Range   Influenza A, POC Negative Negative   Influenza B, POC Negative Negative   No results found.  Assessment and Plan: 72 y.o. female with likely URI viral illness. Flu test  negative. COPD exacerbation unlikely given no shortness of breath and nonproductive cough.  Patient will benefit from symptomatic management for a viral illness. Prescribed benzonatate and codeine cough medicine for use, especially at night. Provided back up prednisone and azithromycin if patient worsens or exhibits significant shortness of breath.   Patient should return to follow up if worsening.    Orders Placed This Encounter  Procedures  . POCT Influenza A/B   Meds ordered this encounter  Medications  . benzonatate (TESSALON) 200 MG capsule    Sig: Take 1 capsule (200 mg total) by mouth 3 (three) times daily as needed for cough.    Dispense:  45 capsule    Refill:  0  . guaiFENesin-codeine 100-10 MG/5ML syrup    Sig: Take 5 mLs by mouth at bedtime as needed for cough.    Dispense:  120 mL    Refill:  0  . azithromycin (ZITHROMAX) 250 MG tablet    Sig: Take 1 tablet (250 mg total) by mouth daily. Take first 2 tablets together, then 1 every day until finished.    Dispense:  6 tablet    Refill:  0  . predniSONE (DELTASONE) 10 MG tablet    Sig: Take 3 tablets (30 mg total) by mouth daily with breakfast.    Dispense:  15 tablet    Refill:  0     Discussed warning signs or symptoms. Please see discharge instructions. Patient expresses  understanding.

## 2017-06-15 ENCOUNTER — Other Ambulatory Visit: Payer: Self-pay | Admitting: Physician Assistant

## 2017-06-16 ENCOUNTER — Encounter: Payer: Self-pay | Admitting: Physician Assistant

## 2017-06-19 ENCOUNTER — Other Ambulatory Visit: Payer: Self-pay | Admitting: Physician Assistant

## 2017-06-19 MED ORDER — SEMAGLUTIDE(0.25 OR 0.5MG/DOS) 2 MG/1.5ML ~~LOC~~ SOPN: 0.5000 mg | PEN_INJECTOR | SUBCUTANEOUS | 0 refills | Status: AC

## 2017-06-22 ENCOUNTER — Other Ambulatory Visit: Payer: Self-pay | Admitting: *Deleted

## 2017-06-23 ENCOUNTER — Other Ambulatory Visit: Payer: Self-pay | Admitting: Physician Assistant

## 2017-06-23 DIAGNOSIS — E119 Type 2 diabetes mellitus without complications: Secondary | ICD-10-CM

## 2017-06-27 DIAGNOSIS — L97511 Non-pressure chronic ulcer of other part of right foot limited to breakdown of skin: Secondary | ICD-10-CM | POA: Diagnosis not present

## 2017-06-27 DIAGNOSIS — L987 Excessive and redundant skin and subcutaneous tissue: Secondary | ICD-10-CM | POA: Diagnosis not present

## 2017-07-19 ENCOUNTER — Other Ambulatory Visit: Payer: Self-pay

## 2017-07-19 ENCOUNTER — Encounter: Payer: Self-pay | Admitting: *Deleted

## 2017-07-19 ENCOUNTER — Emergency Department (INDEPENDENT_AMBULATORY_CARE_PROVIDER_SITE_OTHER)
Admission: EM | Admit: 2017-07-19 | Discharge: 2017-07-19 | Disposition: A | Discharge status: Home / Self Care | Payer: Medicare Other | Source: Home / Self Care | Attending: Emergency Medicine | Admitting: Emergency Medicine

## 2017-07-19 DIAGNOSIS — R350 Frequency of micturition: Secondary | ICD-10-CM | POA: Diagnosis not present

## 2017-07-19 DIAGNOSIS — R300 Dysuria: Secondary | ICD-10-CM

## 2017-07-19 LAB — POCT URINALYSIS DIP (MANUAL ENTRY)
Bilirubin, UA: NEGATIVE
Glucose, UA: NEGATIVE mg/dL
Nitrite, UA: NEGATIVE
PH UA: 6.5 (ref 5.0–8.0)
Protein Ur, POC: NEGATIVE mg/dL
SPEC GRAV UA: 1.015 (ref 1.010–1.025)
UROBILINOGEN UA: 0.2 U/dL

## 2017-07-19 MED ORDER — CEPHALEXIN 500 MG PO CAPS: 500.0000 mg | ORAL_CAPSULE | Freq: Two times a day (BID) | ORAL | 0 refills | Status: DC

## 2017-07-19 NOTE — Discharge Instructions (Addendum)
Please drink good quantities of fluids. Take antibiotics twice a day. Please go to the emergency room if he develops systemic symptoms of fever, chills, or back pain

## 2017-07-19 NOTE — ED Triage Notes (Signed)
Patient c/o 2-3 weeks of urinary pressure and urinary frequency.

## 2017-07-19 NOTE — ED Provider Notes (Signed)
[facility]    CSN: [id number] Arrival date & time: 07/19/17  1831     History   Chief Complaint Chief Complaint  Patient presents with  . Urinary Frequency  . Bladder Pressure    HPI Sarah Wright is a 72 y.o. female.  Patient states for the last 2-3 weeks she has had urinary frequency with voiding small amounts.  She has had a bad odor to her urine.  She goes every 10-15 minutes but only has a small amount of urine production.  She does have a history of urinary tract infections.  She also is diabetic.  She has a pressure sensation in her lower abdomen.  Urinary Frequency     Past Medical History:  Diagnosis Date  . Diabetes mellitus without complication ([place])   . Hyperlipidemia   . Thyroid disease     Patient Active Problem List   Diagnosis Date Noted  . Chronic pain syndrome 05/06/2017  . Microalbuminuria 01/30/2017  . DDD (degenerative disc disease), cervical 12/29/2016  . Numbness around mouth 09/08/2016  . Balance problem 08/13/2016  . Left shoulder pain 08/13/2016  . Right shoulder injury 01/27/2016  . Psoriasis 09/23/2015  . Memory loss 07/29/2015  . Thyroid nodule 06/22/2015  . Sensorineural hearing loss of both ears 01/26/2015  . Dizziness 01/04/2015  . Morbid obesity ([place]) 10/07/2014  . Primary osteoarthritis of both knees 07/21/2014  . H/O colonoscopy 04/02/2014  . Thyroid disease 04/02/2014  . Cataract 04/02/2014  . Type 2 diabetes mellitus ([place]) 04/02/2014  . Chronic venous insufficiency 03/27/2014  . Lumbar spondylosis 03/17/2014  . Aortic atherosclerosis ([place]) 03/17/2014  . Anxiety and depression 03/16/2014  . Hyperlipidemia 03/16/2014  . COLD (chronic obstructive lung disease) ([place]) 03/16/2014  . Insomnia 03/16/2014  . Esophageal reflux 03/16/2014  . Bilateral hand pain 03/16/2014  . DDD (degenerative disc disease), lumbar 03/16/2014    Past Surgical History:  Procedure Laterality Date  . ABDOMINAL HYSTERECTOMY    .  CARPAL TUNNEL RELEASE     both wrists  . CATARACT EXTRACTION, BILATERAL    . CERVICAL FUSION    . GALLBLADDER SURGERY    . right foot surgery    . THYROIDECTOMY, PARTIAL     right side removed    OB History   None      Home Medications    Prior to Admission medications   Medication Sig Start Date End Date Taking? Authorizing Provider  albuterol (PROVENTIL HFA;VENTOLIN HFA) 108 (90 Base) MCG/ACT inhaler Inhale 2 puffs into the lungs every 6 (six) hours as needed for wheezing or shortness of breath. 08/11/16   Breeback, Jade L, PA-C  AMBULATORY NON FORMULARY MEDICATION Knee-high, medium compression, graduated compression stockings. Apply to lower extremities. [email or web address], Zippered Compression Stockings, medium circ, long length 08/26/14   Silverio Decamp, MD  aspirin 81 MG tablet Take 81 mg by mouth daily.    [provider]  atorvastatin (LIPITOR) 20 MG tablet Take 1 tablet (20 mg total) by mouth daily. Need fasting lab work for future refills. 06/01/17   Donella Stade, PA-C  BD PEN NEEDLE NANO U/F 32G X 4 MM MISC USE WITH VICTOZA ONCE DAILY 12/23/15   Silverio Decamp, MD  budesonide-formoterol [facility]) 160-4.5 MCG/ACT inhaler Inhale 2 puffs into the lungs 2 (two) times daily. 07/26/16   Breeback, Jade L, PA-C  buPROPion (WELLBUTRIN XL) 150 MG 24 hr tablet TAKE 2 TABLETS BY MOUTH EVERY DAY IN THE MORNING 06/05/17   [name]  L, PA-C  busPIRone (BUSPAR) 7.5 MG tablet Take 1 tablet (7.5 mg total) by mouth 3 (three) times daily. 05/02/17   Breeback, Jade L, PA-C  calcipotriene (DOVONOX) 0.005 % cream Apply 1-2 grams to affected area 1-2 times daily. 09/23/15   Hommel, Hilliard Maneri, DO  cephALEXin (KEFLEX) 500 MG capsule Take 1 capsule (500 mg total) by mouth 2 (two) times daily. 07/19/17   Darlyne Russian, MD  donepezil (ARICEPT) 10 MG tablet TAKE ONE TABLET (5 MG TOTAL) BY MOUTH AT BEDTIME. TO HELP PRESERVE MEMORY. 05/02/17   Breeback, Jade L, PA-C  DULoxetine  (CYMBALTA) 30 MG capsule Take one tablet for 7 days then increase to 2 tablets daily. 05/21/17   Breeback, Jade L, PA-C  furosemide (LASIX) 20 MG tablet TAKE ONE TABLET BY MOUTH DAILY AS NEEDED FOR ANKLE SWELLING 10/30/16   Breeback, Jade L, PA-C  levocetirizine (XYZAL) 5 MG tablet Take 1 tablet (5 mg total) by mouth every evening. 02/21/17   Breeback, Jade L, PA-C  lubiprostone (AMITIZA) 8 MCG capsule Take 8 mcg by mouth daily with breakfast.    [provider]  meloxicam (MOBIC) 15 MG tablet Take 1 tablet (15 mg total) by mouth daily. 05/02/17   Breeback, Jade L, PA-C  montelukast (SINGULAIR) 10 MG tablet TAKE 1 TABLET (10 MG TOTAL) BY MOUTH AT BEDTIME. 06/15/17   Breeback, Jade L, PA-C  OLANZapine (ZYPREXA) 5 MG tablet Take 1 tablet (5 mg total) by mouth at bedtime. 05/02/17   Breeback, Jade L, PA-C  Oxycodone HCl 10 MG TABS TAKE 1 TABELT BY MOUTH EVERY 6 HOURS FOR 30 DAYS 09/13/15   [provider]  Semaglutide (OZEMPIC) 0.25 or 0.5 MG/DOSE SOPN Inject 0.5 mg into the skin once a week. 06/19/17 07/19/17  Breeback, Luvenia Starch L, PA-C  tiotropium (SPIRIVA HANDIHALER) 18 MCG inhalation capsule INHALE ONE CAPSULE VIA HANDIHALER ONE TIME DAILY 07/26/16   Breeback, Jade L, PA-C  topiramate (TOPAMAX) 50 MG tablet One half tab by mouth daily for a week, then one tab by mouth daily for 1 week, then increase to twice daily. 05/02/17   Donella Stade, PA-C    Family History Family History  Problem Relation Age of Onset  . Uterine cancer Unknown   . Pancreatic cancer Unknown   . Heart attack Father   . Diabetes Father   . Depression Mother   . Hypertension Unknown        parents    Social History Social History   Tobacco Use  . Smoking status: Former Smoker    Last attempt to quit: 02/12/2014    Years since quitting: 3.4  . Smokeless tobacco: Never Used  Substance Use Topics  . Alcohol use: No    Alcohol/week: 0.0 oz  . Drug use: No     Allergies   Sulfa antibiotics and Vistaril  [hydroxyzine hcl]   Review of Systems Review of Systems  Constitutional: Negative.  Negative for fever.  Cardiovascular: Negative.   Gastrointestinal: Negative.   Genitourinary: Positive for difficulty urinating, dysuria, frequency and urgency. Negative for flank pain.     Physical Exam Triage Vital Signs ED Triage Vitals  Enc Vitals Group     BP 07/19/17 1902 123/74     Pulse Rate 07/19/17 1902 96     Resp 07/19/17 1902 20     Temp 07/19/17 1902 98.8 F (37.1 C)     Temp Source 07/19/17 1902 Oral     SpO2 07/19/17 1902 94 %  Weight 07/19/17 1903 244 lb (110.7 kg)     Height --      Head Circumference --      Peak Flow --      Pain Score 07/19/17 1902 0     Pain Loc --      Pain Edu? --      Excl. in [place]? --    No data found.  Updated Vital Signs BP 123/74 (BP Location: Right Arm)   Pulse 96   Temp 98.8 F (37.1 C) (Oral)   Resp 20   Wt 244 lb (110.7 kg)   SpO2 94%   BMI 40.60 kg/m   Visual Acuity Right Eye Distance:   Left Eye Distance:   Bilateral Distance:    Right Eye Near:   Left Eye Near:    Bilateral Near:     Physical Exam  Cardiovascular: Normal rate and regular rhythm.  Pulmonary/Chest: Effort normal and breath sounds normal.  Abdominal: Soft. Bowel sounds are normal. There is no tenderness.     UC Treatments / Results  Labs (all labs ordered are listed, but only abnormal results are displayed) Labs Reviewed  POCT URINALYSIS DIP (MANUAL ENTRY) - Abnormal; Notable for the following components:      Result Value   Clarity, UA cloudy (*)    Ketones, POC UA trace (5) (*)    Blood, UA trace-lysed (*)    Leukocytes, UA Large (3+) (*)    All other components within normal limits  URINE CULTURE    EKG None Radiology No results found.  Procedures Procedures (including critical care time)  Medications Ordered in UC Medications - No data to display   Initial Impression / Assessment and Plan / UC Course  I have reviewed the  triage vital signs and the nursing notes.  Pertinent labs & imaging results that were available during my care of the patient were reviewed by me and considered in my medical decision making (see chart for details). Urine shows large leukocytes negative nitrite negative sugar.  Culture was sent.  She will be on cephalexin 500 twice daily for 7 days.      Final Clinical Impressions(s) / UC Diagnoses   Final diagnoses:  Urinary frequency  Dysuria    ED Discharge Orders        Ordered    cephALEXin (KEFLEX) 500 MG capsule  2 times daily     07/19/17 1935     Please drink good quantities of fluids. Take antibiotics twice a day. Please go to the emergency room if he develops systemic symptoms of fever, chills, or back pain  Controlled Substance Prescriptions [place] Controlled Substance Registry consulted? Not Applicable   Darlyne Russian, MD 07/19/17 2100

## 2017-07-21 LAB — URINE CULTURE
MICRO NUMBER:: 90389908
SPECIMEN QUALITY:: ADEQUATE

## 2017-07-22 ENCOUNTER — Telehealth: Payer: Self-pay | Admitting: Emergency Medicine

## 2017-07-22 NOTE — Telephone Encounter (Signed)
Attempted to contact patient, call was dropped.

## 2017-07-23 ENCOUNTER — Telehealth: Payer: Self-pay | Admitting: *Deleted

## 2017-07-23 NOTE — Telephone Encounter (Signed)
LM with Ucx results, complete ABT and f/u with PCP if she fails to improve.

## 2017-07-24 ENCOUNTER — Other Ambulatory Visit: Payer: Self-pay | Admitting: Physician Assistant

## 2017-07-31 ENCOUNTER — Ambulatory Visit: Payer: Medicare Other | Admitting: Physician Assistant

## 2017-08-01 ENCOUNTER — Ambulatory Visit (INDEPENDENT_AMBULATORY_CARE_PROVIDER_SITE_OTHER): Payer: Medicare Other | Admitting: Physician Assistant

## 2017-08-01 ENCOUNTER — Encounter: Payer: Self-pay | Admitting: Physician Assistant

## 2017-08-01 VITALS — BP 137/49 | HR 71 | Ht 65.0 in | Wt 259.0 lb

## 2017-08-01 DIAGNOSIS — T148XXA Other injury of unspecified body region, initial encounter: Secondary | ICD-10-CM | POA: Insufficient documentation

## 2017-08-01 DIAGNOSIS — R82998 Other abnormal findings in urine: Secondary | ICD-10-CM | POA: Diagnosis not present

## 2017-08-01 DIAGNOSIS — R300 Dysuria: Secondary | ICD-10-CM | POA: Diagnosis not present

## 2017-08-01 DIAGNOSIS — E119 Type 2 diabetes mellitus without complications: Secondary | ICD-10-CM

## 2017-08-01 DIAGNOSIS — I700 Atherosclerosis of aorta: Secondary | ICD-10-CM | POA: Diagnosis not present

## 2017-08-01 DIAGNOSIS — G894 Chronic pain syndrome: Secondary | ICD-10-CM | POA: Diagnosis not present

## 2017-08-01 DIAGNOSIS — E782 Mixed hyperlipidemia: Secondary | ICD-10-CM | POA: Diagnosis not present

## 2017-08-01 DIAGNOSIS — F410 Panic disorder [episodic paroxysmal anxiety] without agoraphobia: Secondary | ICD-10-CM | POA: Diagnosis not present

## 2017-08-01 LAB — POCT URINALYSIS DIPSTICK
BILIRUBIN UA: NEGATIVE
Blood, UA: NEGATIVE
GLUCOSE UA: NEGATIVE
Ketones, UA: NEGATIVE
Nitrite, UA: NEGATIVE
Protein, UA: NEGATIVE
Spec Grav, UA: 1.025 (ref 1.010–1.025)
Urobilinogen, UA: 0.2 E.U./dL
pH, UA: 7 (ref 5.0–8.0)

## 2017-08-01 LAB — POCT GLYCOSYLATED HEMOGLOBIN (HGB A1C): Hemoglobin A1C: 6.4

## 2017-08-01 MED ORDER — HYDROXYZINE PAMOATE 25 MG PO CAPS: ORAL_CAPSULE | ORAL | 2 refills | Status: DC

## 2017-08-01 MED ORDER — SEMAGLUTIDE (1 MG/DOSE) 2 MG/1.5ML ~~LOC~~ SOPN: 1.0000 mg | PEN_INJECTOR | SUBCUTANEOUS | 0 refills | Status: DC

## 2017-08-01 NOTE — Progress Notes (Signed)
Subjective:    Patient ID: Sarah Wright, female    DOB: Jul 26, 1945, 72 y.o.   MRN: [id number]  HPI Sarah Wright presents today for a follow up regarding her weight. She has been taking Ozempic for weight loss and DM management. She is also on metformin. She states that her diet has not significantly changed, but she did travel to [place] for a funeral in which she may have regained some weight. She mentions a skin scrape to the left medial ankle that started about 3 days ago. No discharge but tender to touch. She is using neosporin.  No hypoglycemia. Not checking sugars regularly. She is not very active.   She also requests something she can possibly have for anxiety. She used to take Xanax but had to quit because of her pain contract. She states she had 2 panic attacks while at the funeral/wake. She was given vistaril at a past visit and thought it was causing itching so she stopped it. Itching seemed to continue so she is unsure if this was an actual reaction.    She was seen on 3/28 at the ED for a possible UTI and was given Keflex. She states that she feels like she is still having the pain, pressure, and urgency. Culture came back positive for UTI.   .. Active Ambulatory Problems    Diagnosis Date Noted  . Anxiety and depression 03/16/2014  . Hyperlipidemia 03/16/2014  . COLD (chronic obstructive lung disease) ([place]) 03/16/2014  . Insomnia 03/16/2014  . Esophageal reflux 03/16/2014  . Bilateral hand pain 03/16/2014  . DDD (degenerative disc disease), lumbar 03/16/2014  . Lumbar spondylosis 03/17/2014  . Aortic atherosclerosis ([place]) 03/17/2014  . Chronic venous insufficiency 03/27/2014  . H/O colonoscopy 04/02/2014  . Thyroid disease 04/02/2014  . Cataract 04/02/2014  . Type 2 diabetes mellitus ([place]) 04/02/2014  . Primary osteoarthritis of both knees 07/21/2014  . Morbid obesity ([place]) 10/07/2014  . Dizziness 01/04/2015  . Sensorineural hearing loss of both ears 01/26/2015  . Thyroid nodule  06/22/2015  . Memory loss 07/29/2015  . Psoriasis 09/23/2015  . Right shoulder injury 01/27/2016  . Balance problem 08/13/2016  . Left shoulder pain 08/13/2016  . Numbness around mouth 09/08/2016  . DDD (degenerative disc disease), cervical 12/29/2016  . Microalbuminuria 01/30/2017  . Chronic pain syndrome 05/06/2017  . Skin abrasion 08/01/2017  . Leukocytes in urine 08/01/2017  . Dysuria 08/01/2017  . Panic attack 08/01/2017   Resolved Ambulatory Problems    Diagnosis Date Noted  . No Resolved Ambulatory Problems   Past Medical History:  Diagnosis Date  . Diabetes mellitus without complication ([place])   . Hyperlipidemia   . Thyroid disease       Review of Systems  Constitutional: Negative for chills and fever.  Gastrointestinal: Negative for nausea and vomiting.  Genitourinary: Positive for dysuria and urgency. Negative for flank pain.  Skin: Positive for wound.       Objective:   Physical Exam  Constitutional: She is oriented to person, place, and time. She appears well-developed and well-nourished. No distress.  Obese.   HENT:  Head: Normocephalic and atraumatic.  Eyes: Conjunctivae are normal.  Neck: Normal range of motion. Neck supple.  Cardiovascular: Normal rate, regular rhythm and normal heart sounds.  Pulmonary/Chest: Effort normal and breath sounds normal. No respiratory distress. She has no wheezes.  Abdominal: Soft. There is no tenderness. There is no CVA tenderness.  Neurological: She is alert and oriented to person, place, and time.  Skin:  Skin is warm and dry.  1.5 cm by 1.5cm skin appears to be superficially scraped off and left with a erythematous appearance but no swelling, warmth. Tenderness to palpation.   Psychiatric: She has a normal mood and affect. Her behavior is normal.          Assessment & Plan:  [name] KitchenMarland KitchenLuanne was seen today for diabetes.  Diagnoses and all orders for this visit:  Type 2 diabetes mellitus without complication, without  long-term current use of insulin (HCC) -     POCT HgB A1C -     Semaglutide (OZEMPIC) 1 MG/DOSE SOPN; Inject 1 mg into the skin once a week. -     Lipid Panel w/reflex Direct LDL -     COMPLETE METABOLIC PANEL WITH GFR  Dysuria -     POCT urinalysis dipstick -     Urine Culture  Leukocytes in urine -     Urine Culture  Chronic pain syndrome  Skin abrasion  Panic attack -     hydrOXYzine (VISTARIL) 25 MG capsule; Take 1-2 tablets as needed up to three times a day for anxiety/panic.  Aortic atherosclerosis (HCC) -     Lipid Panel w/reflex Direct LDL  Mixed hyperlipidemia -     Lipid Panel w/reflex Direct LDL   .[name] Lab Results  Component Value Date   HGBA1C 6.4 08/01/2017   .[name] Results for orders placed or performed in visit on 08/01/17  POCT urinalysis dipstick  Result Value Ref Range   Color, UA yellow    Clarity, UA cloudy    Glucose, UA neg    Bilirubin, UA neg    Ketones, UA neg    Spec Grav, UA 1.025 1.010 - 1.025   Blood, UA neg    pH, UA 7.0 5.0 - 8.0   Protein, UA neg    Urobilinogen, UA 0.2 0.2 or 1.0 E.U./dL   Nitrite, UA neg    Leukocytes, UA Moderate (2+) (A) Negative   Appearance     Odor    POCT HgB A1C  Result Value Ref Range   Hemoglobin A1C 6.4     Patient's A1c has decreased which is great news.  Continue metformin increased is and pick at patient's request of the 1 mg weekly dose.  She wishes to get some extra weight benefit as well as the diabetic control. On statin Blood pressure controlled. Pneumonia and flu shot up-to-date.  Discussed ways that she could become more active.  It is getting into warmer weather and she could walk or try to find some water that she could get into help with weight loss.  Discussed wound care for skin scraping.  Obviously since she is a diabetic healing time can be a little longer.  Her sugar is in the controlled range.  We did not have any DuoDERM in the office or I was going to give her a few samples to  place over the wound.  Instead I did give her some nonstick gauze I instructed her to keep covered.  Stop hydrogen peroxide and Neosporin at this time.  Keep clean and dry.  Follow-up if any signs of infection with warmth, swelling, discharge, fever.  If not improving in the next week she can certainly call back and hopefully will have some DuoDERM that we can give her to help with healing.  As far as her anxiety I think she should retry Vistaril.  I do not think that is what was causing the itching.  Discussed she could try as needed up to 3 times a day.  She is not a candidate for benzodiazepines since she is on a chronic pain contract with opioids.  If anxiety continues we can certainly consider increasing BuSpar.  Patient does have some leukocytes in urine.  I would like to culture to confirm she needs another round of antibiotic.  This could represent some external contamination.  Continue to stay hydrated.  Follow-up with any worsening dysuria, abdominal pain, fever.  Follow up in 3 months.   [name].Spent 30 minutes with patient and greater than 50 percent of visit spent counseling patient regarding treatment plan.

## 2017-08-03 ENCOUNTER — Telehealth: Payer: Self-pay | Admitting: Physician Assistant

## 2017-08-03 LAB — URINE CULTURE
MICRO NUMBER:: 90444336
SPECIMEN QUALITY:: ADEQUATE

## 2017-08-03 NOTE — Telephone Encounter (Signed)
[facility] updated.

## 2017-08-03 NOTE — Telephone Encounter (Signed)
Received call from [facility] stating the hydroxyzine capsules are on backorder. The tablets are a slightly different component but they do have them in stock in the 25 mg dose. Will route for review.

## 2017-08-03 NOTE — Progress Notes (Signed)
Call pt: culture negative for bacterial infection.

## 2017-08-03 NOTE — Telephone Encounter (Signed)
Ok for directions 1-2 tablets as needed up to three times a day for anxiety# 90

## 2017-08-08 DIAGNOSIS — M21611 Bunion of right foot: Secondary | ICD-10-CM | POA: Diagnosis not present

## 2017-08-08 DIAGNOSIS — M2041 Other hammer toe(s) (acquired), right foot: Secondary | ICD-10-CM | POA: Diagnosis not present

## 2017-08-08 DIAGNOSIS — L97511 Non-pressure chronic ulcer of other part of right foot limited to breakdown of skin: Secondary | ICD-10-CM | POA: Diagnosis not present

## 2017-08-08 DIAGNOSIS — M2042 Other hammer toe(s) (acquired), left foot: Secondary | ICD-10-CM | POA: Diagnosis not present

## 2017-08-14 ENCOUNTER — Other Ambulatory Visit: Payer: Self-pay | Admitting: Physician Assistant

## 2017-08-14 DIAGNOSIS — F32A Depression, unspecified: Secondary | ICD-10-CM

## 2017-08-14 DIAGNOSIS — F329 Major depressive disorder, single episode, unspecified: Secondary | ICD-10-CM

## 2017-08-14 DIAGNOSIS — F419 Anxiety disorder, unspecified: Principal | ICD-10-CM

## 2017-08-14 DIAGNOSIS — G894 Chronic pain syndrome: Secondary | ICD-10-CM

## 2017-08-15 ENCOUNTER — Other Ambulatory Visit: Payer: Self-pay | Admitting: Physician Assistant

## 2017-08-15 DIAGNOSIS — J42 Unspecified chronic bronchitis: Secondary | ICD-10-CM

## 2017-08-20 ENCOUNTER — Other Ambulatory Visit: Payer: Self-pay | Admitting: Physician Assistant

## 2017-08-20 DIAGNOSIS — F410 Panic disorder [episodic paroxysmal anxiety] without agoraphobia: Secondary | ICD-10-CM

## 2017-08-22 ENCOUNTER — Other Ambulatory Visit: Payer: Self-pay | Admitting: Physician Assistant

## 2017-08-28 ENCOUNTER — Other Ambulatory Visit: Payer: Self-pay | Admitting: Physician Assistant

## 2017-08-28 DIAGNOSIS — F419 Anxiety disorder, unspecified: Principal | ICD-10-CM

## 2017-08-28 DIAGNOSIS — F329 Major depressive disorder, single episode, unspecified: Secondary | ICD-10-CM

## 2017-08-28 DIAGNOSIS — F32A Depression, unspecified: Secondary | ICD-10-CM

## 2017-08-31 DIAGNOSIS — M47816 Spondylosis without myelopathy or radiculopathy, lumbar region: Secondary | ICD-10-CM | POA: Diagnosis not present

## 2017-08-31 DIAGNOSIS — M542 Cervicalgia: Secondary | ICD-10-CM | POA: Diagnosis not present

## 2017-08-31 DIAGNOSIS — M5136 Other intervertebral disc degeneration, lumbar region: Secondary | ICD-10-CM | POA: Diagnosis not present

## 2017-08-31 DIAGNOSIS — M5416 Radiculopathy, lumbar region: Secondary | ICD-10-CM | POA: Diagnosis not present

## 2017-09-01 ENCOUNTER — Other Ambulatory Visit: Payer: Self-pay | Admitting: Physician Assistant

## 2017-09-01 DIAGNOSIS — E7849 Other hyperlipidemia: Secondary | ICD-10-CM

## 2017-09-14 ENCOUNTER — Other Ambulatory Visit: Payer: Self-pay | Admitting: Physician Assistant

## 2017-09-23 ENCOUNTER — Other Ambulatory Visit: Payer: Self-pay | Admitting: Physician Assistant

## 2017-09-23 DIAGNOSIS — F410 Panic disorder [episodic paroxysmal anxiety] without agoraphobia: Secondary | ICD-10-CM

## 2017-10-01 DIAGNOSIS — L84 Corns and callosities: Secondary | ICD-10-CM | POA: Diagnosis not present

## 2017-10-01 DIAGNOSIS — L603 Nail dystrophy: Secondary | ICD-10-CM | POA: Diagnosis not present

## 2017-10-01 DIAGNOSIS — E1151 Type 2 diabetes mellitus with diabetic peripheral angiopathy without gangrene: Secondary | ICD-10-CM | POA: Diagnosis not present

## 2017-10-01 DIAGNOSIS — I739 Peripheral vascular disease, unspecified: Secondary | ICD-10-CM | POA: Diagnosis not present

## 2017-10-10 ENCOUNTER — Other Ambulatory Visit: Payer: Self-pay | Admitting: Physician Assistant

## 2017-10-10 DIAGNOSIS — F410 Panic disorder [episodic paroxysmal anxiety] without agoraphobia: Secondary | ICD-10-CM

## 2017-10-15 ENCOUNTER — Other Ambulatory Visit: Payer: Self-pay | Admitting: Physician Assistant

## 2017-10-15 DIAGNOSIS — R413 Other amnesia: Secondary | ICD-10-CM

## 2017-10-15 DIAGNOSIS — E119 Type 2 diabetes mellitus without complications: Secondary | ICD-10-CM

## 2017-10-18 ENCOUNTER — Other Ambulatory Visit: Payer: Self-pay | Admitting: Physician Assistant

## 2017-10-22 ENCOUNTER — Other Ambulatory Visit: Payer: Self-pay | Admitting: Physician Assistant

## 2017-10-22 DIAGNOSIS — F419 Anxiety disorder, unspecified: Principal | ICD-10-CM

## 2017-10-22 DIAGNOSIS — F329 Major depressive disorder, single episode, unspecified: Secondary | ICD-10-CM

## 2017-10-22 DIAGNOSIS — E7849 Other hyperlipidemia: Secondary | ICD-10-CM

## 2017-10-22 DIAGNOSIS — F32A Depression, unspecified: Secondary | ICD-10-CM

## 2017-10-22 MED ORDER — ATORVASTATIN CALCIUM 20 MG PO TABS: 20.0000 mg | ORAL_TABLET | Freq: Every day | ORAL | 0 refills | Status: DC

## 2017-10-23 ENCOUNTER — Other Ambulatory Visit: Payer: Self-pay | Admitting: Physician Assistant

## 2017-10-23 DIAGNOSIS — E7849 Other hyperlipidemia: Secondary | ICD-10-CM

## 2017-10-30 ENCOUNTER — Other Ambulatory Visit: Payer: Self-pay

## 2017-10-30 DIAGNOSIS — E7849 Other hyperlipidemia: Secondary | ICD-10-CM

## 2017-10-30 MED ORDER — ATORVASTATIN CALCIUM 20 MG PO TABS: 20.0000 mg | ORAL_TABLET | Freq: Every day | ORAL | 0 refills | Status: DC

## 2017-11-08 ENCOUNTER — Other Ambulatory Visit: Payer: Self-pay | Admitting: Physician Assistant

## 2017-11-08 DIAGNOSIS — M25512 Pain in left shoulder: Secondary | ICD-10-CM

## 2017-11-12 DIAGNOSIS — G894 Chronic pain syndrome: Secondary | ICD-10-CM | POA: Diagnosis not present

## 2017-11-12 DIAGNOSIS — M5416 Radiculopathy, lumbar region: Secondary | ICD-10-CM | POA: Diagnosis not present

## 2017-11-12 DIAGNOSIS — M5136 Other intervertebral disc degeneration, lumbar region: Secondary | ICD-10-CM | POA: Diagnosis not present

## 2017-11-12 DIAGNOSIS — M542 Cervicalgia: Secondary | ICD-10-CM | POA: Diagnosis not present

## 2017-11-26 ENCOUNTER — Other Ambulatory Visit: Payer: Self-pay | Admitting: Physician Assistant

## 2017-11-26 DIAGNOSIS — F419 Anxiety disorder, unspecified: Principal | ICD-10-CM

## 2017-11-26 DIAGNOSIS — F329 Major depressive disorder, single episode, unspecified: Secondary | ICD-10-CM

## 2017-11-26 DIAGNOSIS — F32A Depression, unspecified: Secondary | ICD-10-CM

## 2017-11-30 ENCOUNTER — Other Ambulatory Visit: Payer: Self-pay | Admitting: Physician Assistant

## 2017-11-30 DIAGNOSIS — F32A Depression, unspecified: Secondary | ICD-10-CM

## 2017-11-30 DIAGNOSIS — F329 Major depressive disorder, single episode, unspecified: Secondary | ICD-10-CM

## 2017-11-30 DIAGNOSIS — F419 Anxiety disorder, unspecified: Principal | ICD-10-CM

## 2017-12-05 DIAGNOSIS — Z79899 Other long term (current) drug therapy: Secondary | ICD-10-CM | POA: Diagnosis not present

## 2017-12-05 DIAGNOSIS — M47816 Spondylosis without myelopathy or radiculopathy, lumbar region: Secondary | ICD-10-CM | POA: Diagnosis not present

## 2017-12-05 DIAGNOSIS — I10 Essential (primary) hypertension: Secondary | ICD-10-CM | POA: Diagnosis not present

## 2017-12-05 DIAGNOSIS — Z7982 Long term (current) use of aspirin: Secondary | ICD-10-CM | POA: Diagnosis not present

## 2017-12-05 DIAGNOSIS — Z881 Allergy status to other antibiotic agents status: Secondary | ICD-10-CM | POA: Diagnosis not present

## 2017-12-05 DIAGNOSIS — J449 Chronic obstructive pulmonary disease, unspecified: Secondary | ICD-10-CM | POA: Diagnosis not present

## 2017-12-05 DIAGNOSIS — Z882 Allergy status to sulfonamides status: Secondary | ICD-10-CM | POA: Diagnosis not present

## 2017-12-05 DIAGNOSIS — K219 Gastro-esophageal reflux disease without esophagitis: Secondary | ICD-10-CM | POA: Diagnosis not present

## 2017-12-05 DIAGNOSIS — G4733 Obstructive sleep apnea (adult) (pediatric): Secondary | ICD-10-CM | POA: Diagnosis not present

## 2017-12-05 DIAGNOSIS — M47896 Other spondylosis, lumbar region: Secondary | ICD-10-CM | POA: Diagnosis not present

## 2017-12-05 DIAGNOSIS — Z794 Long term (current) use of insulin: Secondary | ICD-10-CM | POA: Diagnosis not present

## 2017-12-14 ENCOUNTER — Other Ambulatory Visit: Payer: Self-pay | Admitting: Physician Assistant

## 2017-12-14 DIAGNOSIS — F410 Panic disorder [episodic paroxysmal anxiety] without agoraphobia: Secondary | ICD-10-CM

## 2017-12-28 ENCOUNTER — Other Ambulatory Visit: Payer: Self-pay | Admitting: Physician Assistant

## 2017-12-28 DIAGNOSIS — F410 Panic disorder [episodic paroxysmal anxiety] without agoraphobia: Secondary | ICD-10-CM

## 2018-01-05 ENCOUNTER — Other Ambulatory Visit: Payer: Self-pay | Admitting: Physician Assistant

## 2018-01-05 DIAGNOSIS — F419 Anxiety disorder, unspecified: Principal | ICD-10-CM

## 2018-01-05 DIAGNOSIS — F329 Major depressive disorder, single episode, unspecified: Secondary | ICD-10-CM

## 2018-01-05 DIAGNOSIS — F32A Depression, unspecified: Secondary | ICD-10-CM

## 2018-01-07 ENCOUNTER — Other Ambulatory Visit: Payer: Self-pay | Admitting: Physician Assistant

## 2018-01-07 DIAGNOSIS — F32A Depression, unspecified: Secondary | ICD-10-CM

## 2018-01-07 DIAGNOSIS — L603 Nail dystrophy: Secondary | ICD-10-CM | POA: Diagnosis not present

## 2018-01-07 DIAGNOSIS — L84 Corns and callosities: Secondary | ICD-10-CM | POA: Diagnosis not present

## 2018-01-07 DIAGNOSIS — F419 Anxiety disorder, unspecified: Principal | ICD-10-CM

## 2018-01-07 DIAGNOSIS — I739 Peripheral vascular disease, unspecified: Secondary | ICD-10-CM | POA: Diagnosis not present

## 2018-01-07 DIAGNOSIS — F329 Major depressive disorder, single episode, unspecified: Secondary | ICD-10-CM

## 2018-01-07 DIAGNOSIS — E1151 Type 2 diabetes mellitus with diabetic peripheral angiopathy without gangrene: Secondary | ICD-10-CM | POA: Diagnosis not present

## 2018-01-09 ENCOUNTER — Other Ambulatory Visit: Payer: Self-pay | Admitting: Physician Assistant

## 2018-01-09 DIAGNOSIS — E119 Type 2 diabetes mellitus without complications: Secondary | ICD-10-CM

## 2018-01-10 ENCOUNTER — Ambulatory Visit (INDEPENDENT_AMBULATORY_CARE_PROVIDER_SITE_OTHER): Payer: Medicare Other | Admitting: Osteopathic Medicine

## 2018-01-10 ENCOUNTER — Encounter: Payer: Self-pay | Admitting: Osteopathic Medicine

## 2018-01-10 VITALS — BP 120/76 | HR 86 | Temp 98.2°F | Wt 230.4 lb

## 2018-01-10 DIAGNOSIS — E119 Type 2 diabetes mellitus without complications: Secondary | ICD-10-CM | POA: Diagnosis not present

## 2018-01-10 DIAGNOSIS — J441 Chronic obstructive pulmonary disease with (acute) exacerbation: Secondary | ICD-10-CM

## 2018-01-10 DIAGNOSIS — J42 Unspecified chronic bronchitis: Secondary | ICD-10-CM | POA: Diagnosis not present

## 2018-01-10 MED ORDER — PREDNISONE 20 MG PO TABS: 20.0000 mg | ORAL_TABLET | Freq: Two times a day (BID) | ORAL | 0 refills | Status: DC

## 2018-01-10 MED ORDER — BENZONATATE 200 MG PO CAPS: 200.0000 mg | ORAL_CAPSULE | Freq: Two times a day (BID) | ORAL | 1 refills | Status: DC | PRN

## 2018-01-10 MED ORDER — UMECLIDINIUM BROMIDE 62.5 MCG/INH IN AEPB: 1.0000 | INHALATION_SPRAY | Freq: Every day | RESPIRATORY_TRACT | 11 refills | Status: DC

## 2018-01-10 MED ORDER — AZITHROMYCIN 250 MG PO TABS: ORAL_TABLET | ORAL | 0 refills | Status: DC

## 2018-01-10 NOTE — Progress Notes (Signed)
HPI: Sarah Wright is a 72 y.o. female who  has a past medical history of Diabetes mellitus without complication ([place]), Hyperlipidemia, and Thyroid disease.  she presents to [facility] today, 01/10/18,  for chief complaint of: Sick  Cough for 3 days, thinks she might have gotten sick from her granddaughter. No OTC meds tried. No fever or hemoptysis. Quit smoking years ago. No weakness/dizziness. No flu shot yet this year. UTD on PNA vaccines.  History of COPD, she requests alternative to Spiriva as this device difficult for her to use.  Immunization History  Administered Date(s) Administered  . Influenza, High Dose Seasonal PF 12/13/2016  . Influenza-Unspecified 12/21/2014, 01/09/2016, 12/13/2016  . PPD Test 09/23/2014  . Pneumococcal Conjugate-13 01/12/2014  . Pneumococcal Polysaccharide-23 04/29/2003, 03/28/2016  . Tdap 10/04/2015  . Zoster 05/22/2012  . Zoster Recombinat (Shingrix) 11/21/2016         Past medical, surgical, social and family history reviewed:  Patient Active Problem List   Diagnosis Date Noted  . Skin abrasion 08/01/2017  . Leukocytes in urine 08/01/2017  . Dysuria 08/01/2017  . Panic attack 08/01/2017  . Chronic pain syndrome 05/06/2017  . Microalbuminuria 01/30/2017  . DDD (degenerative disc disease), cervical 12/29/2016  . Numbness around mouth 09/08/2016  . Balance problem 08/13/2016  . Left shoulder pain 08/13/2016  . Right shoulder injury 01/27/2016  . Psoriasis 09/23/2015  . Memory loss 07/29/2015  . Thyroid nodule 06/22/2015  . Sensorineural hearing loss of both ears 01/26/2015  . Dizziness 01/04/2015  . Morbid obesity ([place]) 10/07/2014  . Primary osteoarthritis of both knees 07/21/2014  . H/O colonoscopy 04/02/2014  . Thyroid disease 04/02/2014  . Cataract 04/02/2014  . Type 2 diabetes mellitus ([place]) 04/02/2014  . Chronic venous insufficiency 03/27/2014  . Lumbar spondylosis 03/17/2014  . Aortic  atherosclerosis ([place]) 03/17/2014  . Anxiety and depression 03/16/2014  . Hyperlipidemia 03/16/2014  . COLD (chronic obstructive lung disease) ([employer]) 03/16/2014  . Insomnia 03/16/2014  . Esophageal reflux 03/16/2014  . Bilateral hand pain 03/16/2014  . DDD (degenerative disc disease), lumbar 03/16/2014    Past Surgical History:  Procedure Laterality Date  . ABDOMINAL HYSTERECTOMY    . CARPAL TUNNEL RELEASE     both wrists  . CATARACT EXTRACTION, BILATERAL    . CERVICAL FUSION    . GALLBLADDER SURGERY    . right foot surgery    . THYROIDECTOMY, PARTIAL     right side removed    Social History   Tobacco Use  . Smoking status: Former Smoker    Last attempt to quit: 02/12/2014    Years since quitting: 3.9  . Smokeless tobacco: Never Used  Substance Use Topics  . Alcohol use: No    Alcohol/week: 0.0 standard drinks    Family History  Problem Relation Age of Onset  . Uterine cancer Unknown   . Pancreatic cancer Unknown   . Heart attack Father   . Diabetes Father   . Depression Mother   . Hypertension Unknown        parents     Current medication list and allergy/intolerance information reviewed:    Current Outpatient Medications  Medication Sig Dispense Refill  . albuterol (PROVENTIL HFA;VENTOLIN HFA) 108 (90 Base) MCG/ACT inhaler Inhale 2 puffs into the lungs every 6 (six) hours as needed for wheezing or shortness of breath. 1 Inhaler 2  . AMBULATORY NON FORMULARY MEDICATION Knee-high, medium compression, graduated compression stockings. Apply to lower extremities. [email or web address], Zippered Compression Stockings,  medium circ, long length 1 each 0  . aspirin 81 MG tablet Take 81 mg by mouth daily.    [name] atorvastatin (LIPITOR) 20 MG tablet Take 1 tablet (20 mg total) by mouth daily. Need fasting lab work for future refills. 90 tablet 0  . BD PEN NEEDLE NANO U/F 32G X 4 MM MISC USE WITH VICTOZA ONCE DAILY 100 each 2  . buPROPion (WELLBUTRIN XL) 150 MG 24 hr tablet  TAKE 2 TABLETS (300 MG TOTAL) BY MOUTH DAILY. DUE FOR FOLLOW UP VISIT W/PCP 180 tablet 1  . buPROPion (WELLBUTRIN XL) 150 MG 24 hr tablet TAKE 2 TABLETS (300 MG TOTAL) BY MOUTH DAILY. DUE FOR FOLLOW UP VISIT W/PCP 60 tablet 0  . busPIRone (BUSPAR) 7.5 MG tablet TAKE 1 TABLET (7.5 MG TOTAL) BY MOUTH 3 (THREE) TIMES DAILY. 90 tablet 5  . calcipotriene (DOVONOX) 0.005 % cream Apply 1-2 grams to affected area 1-2 times daily. 360 g 1  . donepezil (ARICEPT) 10 MG tablet TAKE ONE TABLET (5 MG TOTAL) BY MOUTH AT BEDTIME. TO HELP PRESERVE MEMORY. 90 tablet 1  . DULoxetine (CYMBALTA) 30 MG capsule Take 2 capsules (60 mg total) by mouth daily. 180 capsule 1  . furosemide (LASIX) 20 MG tablet TAKE ONE TABLET BY MOUTH DAILY AS NEEDED FOR ANKLE SWELLING 90 tablet 0  . hydrOXYzine (ATARAX/VISTARIL) 25 MG tablet TAKE 1-2 TABLETS BY MOUTH 3 TIMES DAILY. DUE FOR FOLLOW UP WITH PCP 90 tablet 0  . levocetirizine (XYZAL) 5 MG tablet Take 1 tablet (5 mg total) by mouth every evening. 90 tablet 3  . meloxicam (MOBIC) 15 MG tablet TAKE 1 TABLET BY MOUTH EVERY DAY 90 tablet 0  . montelukast (SINGULAIR) 10 MG tablet TAKE 1 TABLET (10 MG TOTAL) BY MOUTH AT BEDTIME. 90 tablet 4  . OLANZapine (ZYPREXA) 5 MG tablet Take 1 tablet (5 mg total) by mouth at bedtime. 90 tablet 1  . Oxycodone HCl 10 MG TABS TAKE 1 TABELT BY MOUTH EVERY 6 HOURS FOR 30 DAYS  0  . Semaglutide, 1 MG/DOSE, (OZEMPIC, 1 MG/DOSE,) 2 MG/1.5ML SOPN Inject 1 mg as directed once a week. MUST MAKE APPOINTMENT 2 pen 0  . SYMBICORT 160-4.5 MCG/ACT inhaler INHALE 2 PUFFS INTO THE LUNGS 2 (TWO) TIMES DAILY. 10.2 Inhaler 8  . tiotropium (SPIRIVA HANDIHALER) 18 MCG inhalation capsule INHALE ONE CAPSULE VIA HANDIHALER ONE TIME DAILY 30 capsule 10  . topiramate (TOPAMAX) 50 MG tablet TAKE 1 TABLET BY MOUTH TWICE A DAY 60 tablet 2  . topiramate (TOPAMAX) 50 MG tablet TAKE 1/2 TAB BY MOUTH DAILY FOR 1 WEEK, 1 TAB DAILY FOR 1 WEEK THEN 1 TAB TWICE DAILY THEREAFTER 180  tablet 0   No current facility-administered medications for this visit.     Allergies  Allergen Reactions  . Sulfa Antibiotics     Patient had reaction as a child; does not know what type of reaction  . Vistaril [Hydroxyzine Hcl]     Per patient she had increased itching with medication.       Review of Systems:  Constitutional:  No  fever, no chills, +recent illness, No unintentional weight changes. +significant fatigue.   HEENT: No  headache, no vision change, no hearing change, +sore throat, +sinus pressure  Cardiac: No  chest pain, No  pressure, No palpitations  Respiratory:  No  shortness of breath. +Cough  Gastrointestinal: No  abdominal pain, No  nausea, No  vomiting,  No  blood in stool, No  diarrhea  Musculoskeletal: No new myalgia/arthralgia  Skin: No  Rash  Neurologic: No  weakness, No  dizziness  Exam:  BP 120/76 (BP Location: Left Arm, Patient Position: Sitting, Cuff Size: Large)   Pulse 86   Temp 98.2 F (36.8 C) (Oral)   Wt 230 lb 6.4 oz (104.5 kg)   SpO2 94%   BMI 38.34 kg/m   Constitutional: VS see above. General Appearance: alert, well-developed, well-nourished, NAD  Eyes: Normal lids and conjunctive, non-icteric sclera  Ears, Nose, Mouth, Throat: MMM, Normal external inspection ears/nares/mouth/lips/gums. TM normal bilaterally. Pharynx/tonsils no erythema, no exudate. Nasal mucosa normal.   Neck: No masses, trachea midline. No tenderness/mass appreciated. No lymphadenopathy  Respiratory: Normal respiratory effort. no wheeze, no rhonchi, no rales.  Diminished breath sounds in all lung fields  Cardiovascular: S1/S2 normal, no murmur, no rub/gallop auscultated. RRR. No lower extremity edema.   Gastrointestinal: Nontender, no masses. Bowel sounds normal.  Musculoskeletal: Gait normal.   Neurological: Normal balance/coordination. No tremor.   Skin: warm, dry, intact.   Psychiatric: Normal judgment/insight. Normal mood and  affect.    ASSESSMENT/PLAN:   COPD exacerbation (HCC)  Chronic bronchitis, unspecified chronic bronchitis type ([place])  Type 2 diabetes mellitus without complication, without long-term current use of insulin ([place]) - last A1C good range, f/u w/ PCP as directed    Meds ordered this encounter  Medications  . predniSONE (DELTASONE) 20 MG tablet    Sig: Take 1 tablet (20 mg total) by mouth 2 (two) times daily with a meal.    Dispense:  10 tablet    Refill:  0  . umeclidinium bromide (INCRUSE ELLIPTA) 62.5 MCG/INH AEPB    Sig: Inhale 1 puff into the lungs daily.    Dispense:  30 each    Refill:  11  . azithromycin (ZITHROMAX) 250 MG tablet    Sig: 2 tabs po on Day 1, then 1 tab daily Days 2 - 5. Fill if worsening cough, short of breath. Expires 01/21/18    Dispense:  6 tablet    Refill:  0  . benzonatate (TESSALON) 200 MG capsule    Sig: Take 1 capsule (200 mg total) by mouth 2 (two) times daily as needed for cough.    Dispense:  30 capsule    Refill:  1     Visit summary with medication list and pertinent instructions was printed for patient to review. All questions at time of visit were answered - patient instructed to contact office with any additional concerns. ER/RTC precautions were reviewed with the patient.   Follow-up plan: Return for routine care w/ PCP when due. Flu shot when feeling better.  .    Please note: voice recognition software was used to produce this document, and typos may escape review. Please contact Dr. Sheppard Coil for any needed clarifications.

## 2018-01-14 ENCOUNTER — Encounter: Payer: Self-pay | Admitting: Physician Assistant

## 2018-01-14 LAB — HM DIABETES EYE EXAM

## 2018-01-21 ENCOUNTER — Other Ambulatory Visit: Payer: Self-pay | Admitting: Physician Assistant

## 2018-01-21 DIAGNOSIS — E119 Type 2 diabetes mellitus without complications: Secondary | ICD-10-CM

## 2018-01-25 ENCOUNTER — Encounter: Payer: Self-pay | Admitting: Physician Assistant

## 2018-02-02 ENCOUNTER — Other Ambulatory Visit: Payer: Self-pay | Admitting: Physician Assistant

## 2018-02-02 DIAGNOSIS — F419 Anxiety disorder, unspecified: Principal | ICD-10-CM

## 2018-02-02 DIAGNOSIS — F32A Depression, unspecified: Secondary | ICD-10-CM

## 2018-02-02 DIAGNOSIS — F329 Major depressive disorder, single episode, unspecified: Secondary | ICD-10-CM

## 2018-02-03 ENCOUNTER — Other Ambulatory Visit: Payer: Self-pay | Admitting: Physician Assistant

## 2018-02-03 ENCOUNTER — Other Ambulatory Visit: Payer: Self-pay | Admitting: Sports Medicine

## 2018-02-03 DIAGNOSIS — F329 Major depressive disorder, single episode, unspecified: Secondary | ICD-10-CM

## 2018-02-03 DIAGNOSIS — M25512 Pain in left shoulder: Secondary | ICD-10-CM

## 2018-02-03 DIAGNOSIS — F419 Anxiety disorder, unspecified: Secondary | ICD-10-CM

## 2018-02-03 DIAGNOSIS — E7849 Other hyperlipidemia: Secondary | ICD-10-CM

## 2018-02-03 DIAGNOSIS — G894 Chronic pain syndrome: Secondary | ICD-10-CM

## 2018-02-03 DIAGNOSIS — F32A Depression, unspecified: Secondary | ICD-10-CM

## 2018-02-03 DIAGNOSIS — J42 Unspecified chronic bronchitis: Secondary | ICD-10-CM

## 2018-02-06 DIAGNOSIS — I6529 Occlusion and stenosis of unspecified carotid artery: Secondary | ICD-10-CM | POA: Diagnosis not present

## 2018-02-08 DIAGNOSIS — Z23 Encounter for immunization: Secondary | ICD-10-CM | POA: Diagnosis not present

## 2018-02-14 ENCOUNTER — Encounter: Payer: Self-pay | Admitting: Physician Assistant

## 2018-02-14 DIAGNOSIS — H04123 Dry eye syndrome of bilateral lacrimal glands: Secondary | ICD-10-CM | POA: Diagnosis not present

## 2018-02-14 DIAGNOSIS — H26493 Other secondary cataract, bilateral: Secondary | ICD-10-CM | POA: Diagnosis not present

## 2018-02-18 DIAGNOSIS — M533 Sacrococcygeal disorders, not elsewhere classified: Secondary | ICD-10-CM | POA: Diagnosis not present

## 2018-02-18 DIAGNOSIS — M47816 Spondylosis without myelopathy or radiculopathy, lumbar region: Secondary | ICD-10-CM | POA: Diagnosis not present

## 2018-02-18 DIAGNOSIS — M48061 Spinal stenosis, lumbar region without neurogenic claudication: Secondary | ICD-10-CM | POA: Diagnosis not present

## 2018-02-18 DIAGNOSIS — Z79899 Other long term (current) drug therapy: Secondary | ICD-10-CM | POA: Diagnosis not present

## 2018-02-18 DIAGNOSIS — Z5181 Encounter for therapeutic drug level monitoring: Secondary | ICD-10-CM | POA: Diagnosis not present

## 2018-02-18 DIAGNOSIS — M5136 Other intervertebral disc degeneration, lumbar region: Secondary | ICD-10-CM | POA: Diagnosis not present

## 2018-02-19 ENCOUNTER — Ambulatory Visit (INDEPENDENT_AMBULATORY_CARE_PROVIDER_SITE_OTHER): Payer: Medicare Other | Admitting: Physician Assistant

## 2018-02-19 ENCOUNTER — Encounter: Payer: Self-pay | Admitting: Physician Assistant

## 2018-02-19 VITALS — BP 119/54 | HR 72 | Ht 65.0 in | Wt 236.0 lb

## 2018-02-19 DIAGNOSIS — Z1231 Encounter for screening mammogram for malignant neoplasm of breast: Secondary | ICD-10-CM | POA: Diagnosis not present

## 2018-02-19 DIAGNOSIS — J42 Unspecified chronic bronchitis: Secondary | ICD-10-CM

## 2018-02-19 DIAGNOSIS — F419 Anxiety disorder, unspecified: Secondary | ICD-10-CM | POA: Diagnosis not present

## 2018-02-19 DIAGNOSIS — E119 Type 2 diabetes mellitus without complications: Secondary | ICD-10-CM

## 2018-02-19 DIAGNOSIS — G894 Chronic pain syndrome: Secondary | ICD-10-CM

## 2018-02-19 DIAGNOSIS — R413 Other amnesia: Secondary | ICD-10-CM | POA: Diagnosis not present

## 2018-02-19 DIAGNOSIS — F329 Major depressive disorder, single episode, unspecified: Secondary | ICD-10-CM

## 2018-02-19 DIAGNOSIS — E7849 Other hyperlipidemia: Secondary | ICD-10-CM | POA: Diagnosis not present

## 2018-02-19 DIAGNOSIS — F410 Panic disorder [episodic paroxysmal anxiety] without agoraphobia: Secondary | ICD-10-CM

## 2018-02-19 DIAGNOSIS — F32A Depression, unspecified: Secondary | ICD-10-CM

## 2018-02-19 LAB — POCT GLYCOSYLATED HEMOGLOBIN (HGB A1C): HEMOGLOBIN A1C: 5.8 % — AB (ref 4.0–5.6)

## 2018-02-19 MED ORDER — DONEPEZIL HCL 10 MG PO TABS: ORAL_TABLET | ORAL | 1 refills | Status: DC

## 2018-02-19 MED ORDER — LEVOCETIRIZINE DIHYDROCHLORIDE 5 MG PO TABS: 5.0000 mg | ORAL_TABLET | Freq: Every evening | ORAL | 4 refills | Status: DC

## 2018-02-19 MED ORDER — ATORVASTATIN CALCIUM 20 MG PO TABS: 20.0000 mg | ORAL_TABLET | Freq: Every day | ORAL | 4 refills | Status: DC

## 2018-02-19 MED ORDER — INSULIN PEN NEEDLE 32G X 4 MM MISC: 2 refills | Status: DC

## 2018-02-19 MED ORDER — ALBUTEROL SULFATE HFA 108 (90 BASE) MCG/ACT IN AERS: 2.0000 | INHALATION_SPRAY | Freq: Four times a day (QID) | RESPIRATORY_TRACT | 2 refills | Status: DC | PRN

## 2018-02-19 MED ORDER — SEMAGLUTIDE (1 MG/DOSE) 2 MG/1.5ML ~~LOC~~ SOPN: 1.0000 mg | PEN_INJECTOR | SUBCUTANEOUS | 1 refills | Status: DC

## 2018-02-19 MED ORDER — TOPIRAMATE 50 MG PO TABS: ORAL_TABLET | ORAL | 1 refills | Status: DC

## 2018-02-19 MED ORDER — MONTELUKAST SODIUM 10 MG PO TABS: 10.0000 mg | ORAL_TABLET | Freq: Every day | ORAL | 4 refills | Status: DC

## 2018-02-19 MED ORDER — HYDROXYZINE HCL 25 MG PO TABS: ORAL_TABLET | ORAL | 0 refills | Status: DC

## 2018-02-19 MED ORDER — TIOTROPIUM BROMIDE MONOHYDRATE 2.5 MCG/ACT IN AERS: 2.0000 | INHALATION_SPRAY | Freq: Every day | RESPIRATORY_TRACT | 4 refills | Status: DC

## 2018-02-19 MED ORDER — DULOXETINE HCL 30 MG PO CPEP: 60.0000 mg | ORAL_CAPSULE | Freq: Every day | ORAL | 0 refills | Status: DC

## 2018-02-19 MED ORDER — BUSPIRONE HCL 7.5 MG PO TABS: 7.5000 mg | ORAL_TABLET | Freq: Three times a day (TID) | ORAL | 5 refills | Status: DC

## 2018-02-19 MED ORDER — OLANZAPINE 5 MG PO TABS: 5.0000 mg | ORAL_TABLET | Freq: Every day | ORAL | 1 refills | Status: DC

## 2018-02-19 MED ORDER — BUDESONIDE-FORMOTEROL FUMARATE 160-4.5 MCG/ACT IN AERO: INHALATION_SPRAY | RESPIRATORY_TRACT | 11 refills | Status: DC

## 2018-02-19 MED ORDER — BUPROPION HCL ER (XL) 150 MG PO TB24: 300.0000 mg | ORAL_TABLET | Freq: Every day | ORAL | 1 refills | Status: DC

## 2018-02-19 NOTE — Progress Notes (Signed)
Subjective:    Patient ID: Sarah Wright, female    DOB: 1945/12/02, 72 y.o.   MRN: [id number]  HPI Pt is a 72 yo female with T2DM, thyroid disease, GAD, MDD, chronic pain who presents to the clinic for medication refill.   DM- not checking sugars. She is taking ozempic. No open sores or wounds. No known hypoglycemia. She has lost about 40lbs but has not lost any weight recently. She is taking ozempic.   COPD- does not like incruse. Would like to go back to Spiriva but not capsules. Continues to take symbicort. Overall lungs feel open and good. No significant SOb.   Depression and anxiety- continue to be a problem. She does not have friends here and very bored.   Chronic pain- sees pain clinic.   .. Active Ambulatory Problems    Diagnosis Date Noted  . Anxiety and depression 03/16/2014  . Hyperlipidemia 03/16/2014  . COLD (chronic obstructive lung disease) ([place]) 03/16/2014  . Insomnia 03/16/2014  . Esophageal reflux 03/16/2014  . Bilateral hand pain 03/16/2014  . DDD (degenerative disc disease), lumbar 03/16/2014  . Lumbar spondylosis 03/17/2014  . Aortic atherosclerosis ([place]) 03/17/2014  . Chronic venous insufficiency 03/27/2014  . H/O colonoscopy 04/02/2014  . Thyroid disease 04/02/2014  . Cataract 04/02/2014  . Type 2 diabetes mellitus ([place]) 04/02/2014  . Primary osteoarthritis of both knees 07/21/2014  . Morbid obesity ([place]) 10/07/2014  . Dizziness 01/04/2015  . Sensorineural hearing loss of both ears 01/26/2015  . Thyroid nodule 06/22/2015  . Memory loss 07/29/2015  . Psoriasis 09/23/2015  . Right shoulder injury 01/27/2016  . Balance problem 08/13/2016  . Left shoulder pain 08/13/2016  . Numbness around mouth 09/08/2016  . DDD (degenerative disc disease), cervical 12/29/2016  . Microalbuminuria 01/30/2017  . Chronic pain syndrome 05/06/2017  . Skin abrasion 08/01/2017  . Leukocytes in urine 08/01/2017  . Dysuria 08/01/2017  . Panic attack 08/01/2017    Resolved Ambulatory Problems    Diagnosis Date Noted  . No Resolved Ambulatory Problems   Past Medical History:  Diagnosis Date  . Diabetes mellitus without complication ([place])       Review of Systems  All other systems reviewed and are negative.      Objective:   Physical Exam  Constitutional: She is oriented to person, place, and time. She appears well-developed and well-nourished.  Obese.   HENT:  Head: Normocephalic and atraumatic.  Cardiovascular: Normal rate and regular rhythm.  Pulmonary/Chest: Effort normal and breath sounds normal. She has no wheezes.  Neurological: She is alert and oriented to person, place, and time.  Psychiatric: She has a normal mood and affect. Her behavior is normal.          Assessment & Plan:  [name] KitchenMarland KitchenDiagnoses and all orders for this visit:  Type 2 diabetes mellitus without complication, without long-term current use of insulin (HCC) -     Insulin Pen Needle (BD PEN NEEDLE NANO U/F) 32G X 4 MM MISC; USE WITH VICTOZA ONCE DAILY -     Semaglutide, 1 MG/DOSE, (OZEMPIC, 1 MG/DOSE,) 2 MG/1.5ML SOPN; Inject 1 mg into the skin once a week. -     POCT glycosylated hemoglobin (Hb A1C)  Morbid obesity (HCC) -     topiramate (TOPAMAX) 50 MG tablet; Take one tablet twice a day.  Anxiety and depression -     DULoxetine (CYMBALTA) 30 MG capsule; Take 2 capsules (60 mg total) by mouth daily. Patient must make an appointment to see PCP  for further refills. -     buPROPion (WELLBUTRIN XL) 150 MG 24 hr tablet; Take 2 tablets (300 mg total) by mouth daily. Due for follow up visit w/PCP -     OLANZapine (ZYPREXA) 5 MG tablet; Take 1 tablet (5 mg total) by mouth at bedtime. -     busPIRone (BUSPAR) 7.5 MG tablet; Take 1 tablet (7.5 mg total) by mouth 3 (three) times daily.  Chronic pain syndrome -     DULoxetine (CYMBALTA) 30 MG capsule; Take 2 capsules (60 mg total) by mouth daily. Patient must make an appointment to see PCP for further refills.  Memory  loss -     donepezil (ARICEPT) 10 MG tablet; Take one tablet daily.  Other hyperlipidemia -     atorvastatin (LIPITOR) 20 MG tablet; Take 1 tablet (20 mg total) by mouth daily.  Chronic bronchitis, unspecified chronic bronchitis type (HCC) -     Tiotropium Bromide Monohydrate (SPIRIVA RESPIMAT) 2.5 MCG/ACT AERS; Inhale 2 puffs into the lungs daily. -     albuterol (PROVENTIL HFA;VENTOLIN HFA) 108 (90 Base) MCG/ACT inhaler; Inhale 2 puffs into the lungs every 6 (six) hours as needed for wheezing or shortness of breath. -     levocetirizine (XYZAL) 5 MG tablet; Take 1 tablet (5 mg total) by mouth every evening. -     budesonide-formoterol (SYMBICORT) 160-4.5 MCG/ACT inhaler; INHALE 2 PUFFS INTO THE LUNGS 2 (TWO) TIMES DAILY.  Panic attack -     hydrOXYzine (ATARAX/VISTARIL) 25 MG tablet; TAKE 1-2 TABLETS BY MOUTH 3 TIMES DAILY.  Visit for screening mammogram -     MM 3D SCREEN BREAST BILATERAL  Other orders -     montelukast (SINGULAIR) 10 MG tablet; Take 1 tablet (10 mg total) by mouth at bedtime.   .. Results for orders placed or performed in visit on 02/19/18  POCT glycosylated hemoglobin (Hb A1C)  Result Value Ref Range   Hemoglobin A1C 5.8 (A) 4.0 - 5.6 %   HbA1c POC (<> result, manual entry)     HbA1c, POC (prediabetic range)     HbA1c, POC (controlled diabetic range)     A!C looks great.  Pt could not get urine sample for microalbumin.  On STATIN.  Continue on ozempic.  .. Diabetic Foot Exam - Simple   Simple Foot Form Visual Inspection No deformities, no ulcerations, no other skin breakdown bilaterally:  Yes See comments:  Yes Sensation Testing Intact to touch and monofilament testing bilaterally:  Yes Pulse Check Posterior Tibialis and Dorsalis pulse intact bilaterally:  Yes Comments Lots of callus on great toes bilaterally.       Stop incruse. Start spiriva respimat. Continue symbicort. Follow up with any worsening SOB. Use albuterol as needed.   Discussed  mood. Encouraged patient to get a hobby or do something she loves to meet people. I really think this would be so helpful for her.

## 2018-02-20 ENCOUNTER — Encounter: Payer: Self-pay | Admitting: Physician Assistant

## 2018-03-01 DIAGNOSIS — H26491 Other secondary cataract, right eye: Secondary | ICD-10-CM | POA: Diagnosis not present

## 2018-03-08 DIAGNOSIS — H26492 Other secondary cataract, left eye: Secondary | ICD-10-CM | POA: Diagnosis not present

## 2018-03-11 DIAGNOSIS — Z87891 Personal history of nicotine dependence: Secondary | ICD-10-CM | POA: Diagnosis not present

## 2018-03-11 DIAGNOSIS — Z7982 Long term (current) use of aspirin: Secondary | ICD-10-CM | POA: Diagnosis not present

## 2018-03-11 DIAGNOSIS — G4733 Obstructive sleep apnea (adult) (pediatric): Secondary | ICD-10-CM | POA: Diagnosis not present

## 2018-03-11 DIAGNOSIS — Z79899 Other long term (current) drug therapy: Secondary | ICD-10-CM | POA: Diagnosis not present

## 2018-03-11 DIAGNOSIS — E119 Type 2 diabetes mellitus without complications: Secondary | ICD-10-CM | POA: Diagnosis not present

## 2018-03-11 DIAGNOSIS — M1990 Unspecified osteoarthritis, unspecified site: Secondary | ICD-10-CM | POA: Diagnosis not present

## 2018-03-11 DIAGNOSIS — I10 Essential (primary) hypertension: Secondary | ICD-10-CM | POA: Diagnosis not present

## 2018-03-11 DIAGNOSIS — E890 Postprocedural hypothyroidism: Secondary | ICD-10-CM | POA: Diagnosis not present

## 2018-03-11 DIAGNOSIS — Z7951 Long term (current) use of inhaled steroids: Secondary | ICD-10-CM | POA: Diagnosis not present

## 2018-03-11 DIAGNOSIS — Z9889 Other specified postprocedural states: Secondary | ICD-10-CM | POA: Diagnosis not present

## 2018-03-11 DIAGNOSIS — K219 Gastro-esophageal reflux disease without esophagitis: Secondary | ICD-10-CM | POA: Diagnosis not present

## 2018-03-11 DIAGNOSIS — F329 Major depressive disorder, single episode, unspecified: Secondary | ICD-10-CM | POA: Diagnosis not present

## 2018-03-11 DIAGNOSIS — M533 Sacrococcygeal disorders, not elsewhere classified: Secondary | ICD-10-CM | POA: Diagnosis not present

## 2018-03-11 DIAGNOSIS — F419 Anxiety disorder, unspecified: Secondary | ICD-10-CM | POA: Diagnosis not present

## 2018-03-11 DIAGNOSIS — J449 Chronic obstructive pulmonary disease, unspecified: Secondary | ICD-10-CM | POA: Diagnosis not present

## 2018-03-11 DIAGNOSIS — Z882 Allergy status to sulfonamides status: Secondary | ICD-10-CM | POA: Diagnosis not present

## 2018-03-11 DIAGNOSIS — E7800 Pure hypercholesterolemia, unspecified: Secondary | ICD-10-CM | POA: Diagnosis not present

## 2018-03-11 DIAGNOSIS — Z794 Long term (current) use of insulin: Secondary | ICD-10-CM | POA: Diagnosis not present

## 2018-03-11 DIAGNOSIS — Z791 Long term (current) use of non-steroidal anti-inflammatories (NSAID): Secondary | ICD-10-CM | POA: Diagnosis not present

## 2018-03-18 ENCOUNTER — Encounter: Payer: Self-pay | Admitting: Physician Assistant

## 2018-03-18 ENCOUNTER — Ambulatory Visit (INDEPENDENT_AMBULATORY_CARE_PROVIDER_SITE_OTHER): Payer: Medicare Other | Admitting: Physician Assistant

## 2018-03-18 VITALS — BP 129/61 | HR 75 | Temp 97.4°F | Wt 223.0 lb

## 2018-03-18 DIAGNOSIS — J40 Bronchitis, not specified as acute or chronic: Secondary | ICD-10-CM

## 2018-03-18 DIAGNOSIS — J329 Chronic sinusitis, unspecified: Secondary | ICD-10-CM | POA: Diagnosis not present

## 2018-03-18 MED ORDER — PREDNISONE 50 MG PO TABS: ORAL_TABLET | ORAL | 0 refills | Status: DC

## 2018-03-18 MED ORDER — HYDROCOD POLST-CPM POLST ER 10-8 MG/5ML PO SUER: 5.0000 mL | Freq: Two times a day (BID) | ORAL | 0 refills | Status: DC | PRN

## 2018-03-18 MED ORDER — AZITHROMYCIN 250 MG PO TABS: ORAL_TABLET | ORAL | 0 refills | Status: DC

## 2018-03-18 NOTE — Progress Notes (Signed)
Subjective:    Patient ID: Sarah Wright, female    DOB: 20-Aug-1945, 72 y.o.   MRN: [id number]  HPI Patient is a 72 year old female with a history of COPD, T2DM, anxiety, depression and chronic pain syndrome who presents with a chief complaint of cough and congestion. She states that this has been going on for two weeks. She is coughing consistently throughout the day and at night, sometimes dry, sometimes producing a yellowish sputum. She complains of chest pain when she coughs. She has felt congested in the maxillary and frontal sinuses. She has previously had sinus tenderness but currently better than has been. She also complains of some rhinorrhea, intermittent wheezing and a mild headache. Denies fever, myalgias, sore throat, SOB, and ear pain/congestion. She reports that her granddaughter had similar symptoms.  She has tried Robitussin and Best boy with no relief.   .. Active Ambulatory Problems    Diagnosis Date Noted  . Anxiety and depression 03/16/2014  . Hyperlipidemia 03/16/2014  . COLD (chronic obstructive lung disease) ([place]) 03/16/2014  . Insomnia 03/16/2014  . Esophageal reflux 03/16/2014  . Bilateral hand pain 03/16/2014  . DDD (degenerative disc disease), lumbar 03/16/2014  . Lumbar spondylosis 03/17/2014  . Aortic atherosclerosis ([place]) 03/17/2014  . Chronic venous insufficiency 03/27/2014  . H/O colonoscopy 04/02/2014  . Thyroid disease 04/02/2014  . Cataract 04/02/2014  . Type 2 diabetes mellitus ([place]) 04/02/2014  . Primary osteoarthritis of both knees 07/21/2014  . Morbid obesity ([place]) 10/07/2014  . Dizziness 01/04/2015  . Sensorineural hearing loss of both ears 01/26/2015  . Thyroid nodule 06/22/2015  . Memory loss 07/29/2015  . Psoriasis 09/23/2015  . Right shoulder injury 01/27/2016  . Balance problem 08/13/2016  . Left shoulder pain 08/13/2016  . Numbness around mouth 09/08/2016  . DDD (degenerative disc disease), cervical 12/29/2016  .  Microalbuminuria 01/30/2017  . Chronic pain syndrome 05/06/2017  . Skin abrasion 08/01/2017  . Leukocytes in urine 08/01/2017  . Dysuria 08/01/2017  . Panic attack 08/01/2017   Resolved Ambulatory Problems    Diagnosis Date Noted  . No Resolved Ambulatory Problems   Past Medical History:  Diagnosis Date  . Diabetes mellitus without complication ([place])        Review of Systems  Constitutional: Negative for chills and fever.  HENT: Positive for congestion, rhinorrhea and sinus pain. Negative for ear pain and sore throat.   Respiratory: Positive for cough, chest tightness and wheezing. Negative for shortness of breath.   Musculoskeletal: Negative for arthralgias and myalgias.  Neurological: Positive for headaches.       Objective:   Physical Exam  Constitutional: She appears well-developed and well-nourished.  HENT:  Right Ear: External ear normal.  Left Ear: External ear normal.  Mouth/Throat: Oropharynx is clear and moist.  Cardiovascular: Normal rate, regular rhythm and normal heart sounds. Exam reveals no gallop and no friction rub.  No murmur heard. Pulmonary/Chest: Effort normal and breath sounds normal. No respiratory distress. She exhibits no tenderness.          Assessment & Plan:   [name] KitchenMarland KitchenLuanne was seen today for cough.  Diagnoses and all orders for this visit:  Sinobronchitis -     azithromycin (ZITHROMAX) 250 MG tablet; Take 2 tablets now and then one tablet for 4 days. -     predniSONE (DELTASONE) 50 MG tablet; One tab PO daily for 5 days. -     chlorpheniramine-HYDROcodone ([place]) 10-8 MG/5ML SUER; Take 5 mLs by mouth every 12 (twelve)  hours as needed for cough (cough, will cause drowsiness.).  Other orders -     Discontinue: chlorpheniramine-HYDROcodone ([place]) 10-8 MG/5ML SUER; Take 5 mLs by mouth every 12 (twelve) hours as needed for cough (cough, will cause drowsiness.).   Discussed symptomatic care. Pt given small quantity of cough syrup.  Follow up as needed.   [name] KitchenVernetta Honey PA-C, have reviewed and agree with the above documentation in it's entirety.

## 2018-03-18 NOTE — Patient Instructions (Signed)

## 2018-03-19 ENCOUNTER — Telehealth: Payer: Self-pay | Admitting: Physician Assistant

## 2018-03-19 ENCOUNTER — Encounter: Payer: Self-pay | Admitting: Physician Assistant

## 2018-03-19 NOTE — Telephone Encounter (Signed)
I didn't actually realize she was taking oxycotin that many times a day. No tell her she can use deslym at bedtime.

## 2018-03-19 NOTE — Telephone Encounter (Signed)
Pharmacy called and left message . They are calling to verify if ok to fill patients Tussionex, due to patients age and her taking oxycontin 4 x a day. Will forward to provider for review.

## 2018-03-20 NOTE — Telephone Encounter (Signed)
[facility] advised- RX discontinued and they will call and advise pt to take Delsym instead

## 2018-03-28 DIAGNOSIS — R1084 Generalized abdominal pain: Secondary | ICD-10-CM | POA: Diagnosis not present

## 2018-04-05 ENCOUNTER — Other Ambulatory Visit: Payer: Self-pay | Admitting: Physician Assistant

## 2018-04-05 DIAGNOSIS — F410 Panic disorder [episodic paroxysmal anxiety] without agoraphobia: Secondary | ICD-10-CM

## 2018-04-10 DIAGNOSIS — I739 Peripheral vascular disease, unspecified: Secondary | ICD-10-CM | POA: Diagnosis not present

## 2018-04-10 DIAGNOSIS — M2041 Other hammer toe(s) (acquired), right foot: Secondary | ICD-10-CM | POA: Diagnosis not present

## 2018-04-10 DIAGNOSIS — L84 Corns and callosities: Secondary | ICD-10-CM | POA: Diagnosis not present

## 2018-04-10 DIAGNOSIS — L603 Nail dystrophy: Secondary | ICD-10-CM | POA: Diagnosis not present

## 2018-04-10 DIAGNOSIS — M2042 Other hammer toe(s) (acquired), left foot: Secondary | ICD-10-CM | POA: Diagnosis not present

## 2018-04-10 DIAGNOSIS — E1151 Type 2 diabetes mellitus with diabetic peripheral angiopathy without gangrene: Secondary | ICD-10-CM | POA: Diagnosis not present

## 2018-04-15 ENCOUNTER — Other Ambulatory Visit: Payer: Self-pay

## 2018-04-15 DIAGNOSIS — F410 Panic disorder [episodic paroxysmal anxiety] without agoraphobia: Secondary | ICD-10-CM

## 2018-04-15 MED ORDER — HYDROXYZINE HCL 25 MG PO TABS: ORAL_TABLET | ORAL | 1 refills | Status: DC

## 2018-05-07 ENCOUNTER — Other Ambulatory Visit: Payer: Self-pay | Admitting: Physician Assistant

## 2018-05-07 DIAGNOSIS — F32A Depression, unspecified: Secondary | ICD-10-CM

## 2018-05-07 DIAGNOSIS — G894 Chronic pain syndrome: Secondary | ICD-10-CM

## 2018-05-07 DIAGNOSIS — F419 Anxiety disorder, unspecified: Principal | ICD-10-CM

## 2018-05-07 DIAGNOSIS — F329 Major depressive disorder, single episode, unspecified: Secondary | ICD-10-CM

## 2018-05-22 DIAGNOSIS — M5136 Other intervertebral disc degeneration, lumbar region: Secondary | ICD-10-CM | POA: Diagnosis not present

## 2018-05-22 DIAGNOSIS — M48061 Spinal stenosis, lumbar region without neurogenic claudication: Secondary | ICD-10-CM | POA: Diagnosis not present

## 2018-05-22 DIAGNOSIS — M47816 Spondylosis without myelopathy or radiculopathy, lumbar region: Secondary | ICD-10-CM | POA: Diagnosis not present

## 2018-05-22 DIAGNOSIS — M533 Sacrococcygeal disorders, not elsewhere classified: Secondary | ICD-10-CM | POA: Diagnosis not present

## 2018-05-28 ENCOUNTER — Other Ambulatory Visit: Payer: Self-pay | Admitting: Physician Assistant

## 2018-05-28 DIAGNOSIS — F410 Panic disorder [episodic paroxysmal anxiety] without agoraphobia: Secondary | ICD-10-CM

## 2018-06-04 ENCOUNTER — Other Ambulatory Visit: Payer: Self-pay | Admitting: Sports Medicine

## 2018-06-04 DIAGNOSIS — M25512 Pain in left shoulder: Secondary | ICD-10-CM

## 2018-06-05 ENCOUNTER — Other Ambulatory Visit: Payer: Self-pay | Admitting: Physician Assistant

## 2018-06-05 DIAGNOSIS — F329 Major depressive disorder, single episode, unspecified: Secondary | ICD-10-CM

## 2018-06-05 DIAGNOSIS — F32A Depression, unspecified: Secondary | ICD-10-CM

## 2018-06-05 DIAGNOSIS — F419 Anxiety disorder, unspecified: Principal | ICD-10-CM

## 2018-06-05 DIAGNOSIS — G894 Chronic pain syndrome: Secondary | ICD-10-CM

## 2018-06-07 ENCOUNTER — Encounter: Payer: Self-pay | Admitting: Physician Assistant

## 2018-06-07 ENCOUNTER — Ambulatory Visit (INDEPENDENT_AMBULATORY_CARE_PROVIDER_SITE_OTHER): Payer: Medicare Other

## 2018-06-07 ENCOUNTER — Ambulatory Visit (INDEPENDENT_AMBULATORY_CARE_PROVIDER_SITE_OTHER): Payer: Medicare Other | Admitting: Physician Assistant

## 2018-06-07 VITALS — BP 121/76 | HR 86 | Temp 98.3°F | Ht 65.0 in | Wt 233.0 lb

## 2018-06-07 DIAGNOSIS — J440 Chronic obstructive pulmonary disease with acute lower respiratory infection: Secondary | ICD-10-CM | POA: Diagnosis not present

## 2018-06-07 DIAGNOSIS — R05 Cough: Secondary | ICD-10-CM | POA: Diagnosis not present

## 2018-06-07 DIAGNOSIS — R0602 Shortness of breath: Secondary | ICD-10-CM | POA: Diagnosis not present

## 2018-06-07 MED ORDER — DOXYCYCLINE HYCLATE 100 MG PO TABS: 100.0000 mg | ORAL_TABLET | Freq: Two times a day (BID) | ORAL | 0 refills | Status: AC

## 2018-06-07 NOTE — Patient Instructions (Addendum)
For nasal symptoms/sinusitis: - nasal saline rinses / netti pot several times per day (do this prior to nasal spray) - you can use OTC nasal decongestant sprays like Afrin (oxymetazoline) BUT do not use for more than 3 days as it will cause worsening congestion/nasal symptoms) - warm facial compresses - oral decongestants and antihistamines like Claritin-D and Zyrtec-D may help dry up secretions (caution using decongestants if you have high blood pressure, heart disease or kidney disease) - for sinus headache: Tylenol 1000mg  every 8 hours as needed. Alternate with Ibuprofen 600mg  every 6 hours  For sore throat: - Tylenol 1000mg  every 8 hours as needed for throat pain. Alternate with Ibuprofen 600mg  every 6 hours - Cepacol throat lozenges and/or Chloraseptic spray - Warm salt water gargles  For cough: - Cough is a protective mechanism and an important part of fighting an infection. I encourage you to avoid suppressing your cough with medication during the day if possible.  - Mucinex with at least 8 oz. of water can loosen chest congestion and make cough more productive, which means you will actually cough less - Okay to use a cough suppressant at bedtime in order to rest (Nyquil, Delsym, Robitussin, etc.)  Note: follow package instructions for all over-the-counter medications. If using multi-symptom medications (Dayquil, Theraflu, etc.), check the label for duplicate drug ingredients.   Upper Respiratory Infection, Adult An upper respiratory infection (URI) is a common viral infection of the nose, throat, and upper air passages that lead to the lungs. The most common type of URI is the common cold. URIs usually get better on their own, without medical treatment. What are the causes? A URI is caused by a virus. You may catch a virus by:  Breathing in droplets from an infected person's cough or sneeze.  Touching something that has been exposed to the virus (contaminated) and then touching  your mouth, nose, or eyes. What increases the risk? You are more likely to get a URI if:  You are very young or very old.  It is autumn or winter.  You have close contact with others, such as at a daycare, school, or health care facility.  You smoke.  You have long-term (chronic) heart or lung disease.  You have a weakened disease-fighting (immune) system.  You have nasal allergies or asthma.  You are experiencing a lot of stress.  You work in an area that has poor air circulation.  You have poor nutrition. What are the signs or symptoms? A URI usually involves some of the following symptoms:  Runny or stuffy (congested) nose.  Sneezing.  Cough.  Sore throat.  Headache.  Fatigue.  Fever.  Loss of appetite.  Pain in your forehead, behind your eyes, and over your cheekbones (sinus pain).  Muscle aches.  Redness or irritation of the eyes.  Pressure in the ears or face. How is this diagnosed? This condition may be diagnosed based on your medical history and symptoms, and a physical exam. Your health care provider may use a cotton swab to take a mucus sample from your nose (nasal swab). This sample can be tested to determine what virus is causing the illness. How is this treated? URIs usually get better on their own within 7-10 days. You can take steps at home to relieve your symptoms. Medicines cannot cure URIs, but your health care provider may recommend certain medicines to help relieve symptoms, such as:  Over-the-counter cold medicines.  Cough suppressants. Coughing is a type of defense against infection  that helps to clear the respiratory system, so take these medicines only as recommended by your health care provider.  Fever-reducing medicines. Follow these instructions at home: Activity  Rest as needed.  If you have a fever, stay home from work or school until your fever is gone or until your health care provider says you are no longer contagious.  Your health care provider may have you wear a face mask to prevent your infection from spreading. Relieving symptoms  Gargle with a salt-water mixture 3-4 times a day or as needed. To make a salt-water mixture, completely dissolve -1 tsp of salt in 1 cup of warm water.  Use a cool-mist humidifier to add moisture to the air. This can help you breathe more easily. Eating and drinking   Drink enough fluid to keep your urine pale yellow.  Eat soups and other clear broths. General instructions   Take over-the-counter and prescription medicines only as told by your health care provider. These include cold medicines, fever reducers, and cough suppressants.  Do not use any products that contain nicotine or tobacco, such as cigarettes and e-cigarettes. If you need help quitting, ask your health care provider.  Stay away from secondhand smoke.  Stay up to date on all immunizations, including the yearly (annual) flu vaccine.  Keep all follow-up visits as told by your health care provider. This is important. How to prevent the spread of infection to others   URIs can be passed from person to person (are contagious). To prevent the infection from spreading: ? Wash your hands often with soap and water. If soap and water are not available, use hand sanitizer. ? Avoid touching your mouth, face, eyes, or nose. ? Cough or sneeze into a tissue or your sleeve or elbow instead of into your hand or into the air. Contact a health care provider if:  You are getting worse instead of better.  You have a fever or chills.  Your mucus is brown or red.  You have yellow or brown discharge coming from your nose.  You have pain in your face, especially when you bend forward.  You have swollen neck glands.  You have pain while swallowing.  You have white areas in the back of your throat. Get help right away if:  You have shortness of breath that gets worse.  You have severe or  persistent: ? Headache. ? Ear pain. ? Sinus pain. ? Chest pain.  You have chronic lung disease along with any of the following: ? Wheezing. ? Prolonged cough. ? Coughing up blood. ? A change in your usual mucus.  You have a stiff neck.  You have changes in your: ? Vision. ? Hearing. ? Thinking. ? Mood. Summary  An upper respiratory infection (URI) is a common infection of the nose, throat, and upper air passages that lead to the lungs.  A URI is caused by a virus.  URIs usually get better on their own within 7-10 days.  Medicines cannot cure URIs, but your health care provider may recommend certain medicines to help relieve symptoms. This information is not intended to replace advice given to you by your health care provider. Make sure you discuss any questions you have with your health care provider. Document Released: 10/04/2000 Document Revised: 11/24/2016 Document Reviewed: 11/24/2016 Elsevier Interactive Patient Education  2019 [employer].

## 2018-06-07 NOTE — Progress Notes (Signed)
HPI:                                                                Sarah Wright is a 73 y.o. female who presents to [facility]: [place] today for URI symptoms  URI   This is a new problem. The current episode started in the past 7 days. The problem has been gradually worsening. There has been no fever. Associated symptoms include congestion, coughing, ear pain, headaches, nausea, rhinorrhea and a sore throat. Pertinent negatives include no chest pain, diarrhea or sinus pain.   Sick contacts: granddaughter with ear infections  Depression screen [facility] 2/9 05/02/2017 09/26/2016 08/11/2016 07/26/2016  Decreased Interest 0 2 1 2   Down, Depressed, Hopeless 1 2 2 3   PHQ - 2 Score 1 4 3 5   Altered sleeping 1 1 2 2   Tired, decreased energy 1 1 3 3   Change in appetite 0 1 3 3   Feeling bad or failure about yourself  2 1 3 3   Trouble concentrating 0 1 2 2   Moving slowly or fidgety/restless 0 0 2 2  Suicidal thoughts 0 1 1 0  PHQ-9 Score 5 10 19 20   Difficult doing work/chores Not difficult at all - - -    No flowsheet data found.    Past Medical History:  Diagnosis Date  . Diabetes mellitus without complication ([place])   . Hyperlipidemia   . Thyroid disease    Past Surgical History:  Procedure Laterality Date  . ABDOMINAL HYSTERECTOMY    . CARPAL TUNNEL RELEASE     both wrists  . CATARACT EXTRACTION, BILATERAL    . CERVICAL FUSION    . GALLBLADDER SURGERY    . right foot surgery    . THYROIDECTOMY, PARTIAL     right side removed   Social History   Tobacco Use  . Smoking status: Former Smoker    Last attempt to quit: 02/12/2014    Years since quitting: 4.3  . Smokeless tobacco: Never Used  Substance Use Topics  . Alcohol use: No    Alcohol/week: 0.0 standard drinks   family history includes Depression in her mother; Diabetes in her father; Heart attack in her father; Hypertension in her unknown relative; Pancreatic cancer in her unknown  relative; Uterine cancer in her unknown relative.    ROS: negative except as noted in the HPI  Medications: Current Outpatient Medications  Medication Sig Dispense Refill  . albuterol (PROVENTIL HFA;VENTOLIN HFA) 108 (90 Base) MCG/ACT inhaler Inhale 2 puffs into the lungs every 6 (six) hours as needed for wheezing or shortness of breath. 1 Inhaler 2  . AMBULATORY NON FORMULARY MEDICATION Knee-high, medium compression, graduated compression stockings. Apply to lower extremities. [email or web address], Zippered Compression Stockings, medium circ, long length 1 each 0  . aspirin 81 MG tablet Take 81 mg by mouth daily.    [name] atorvastatin (LIPITOR) 20 MG tablet Take 1 tablet (20 mg total) by mouth daily. 90 tablet 4  . azithromycin (ZITHROMAX) 250 MG tablet Take 2 tablets now and then one tablet for 4 days. 6 tablet 0  . budesonide-formoterol (SYMBICORT) 160-4.5 MCG/ACT inhaler INHALE 2 PUFFS INTO THE LUNGS 2 (TWO) TIMES DAILY. 10.2 Inhaler 11  . buPROPion (WELLBUTRIN XL) 150 MG  24 hr tablet Take 2 tablets (300 mg total) by mouth daily. Due for follow up visit w/PCP 180 tablet 1  . busPIRone (BUSPAR) 7.5 MG tablet Take 1 tablet (7.5 mg total) by mouth 3 (three) times daily. 90 tablet 5  . calcipotriene (DOVONOX) 0.005 % cream Apply 1-2 grams to affected area 1-2 times daily. 360 g 1  . chlorpheniramine-HYDROcodone (TUSSIONEX) 10-8 MG/5ML SUER Take 5 mLs by mouth every 12 (twelve) hours as needed for cough (cough, will cause drowsiness.). 50 mL 0  . donepezil (ARICEPT) 10 MG tablet Take one tablet daily. 90 tablet 1  . DULoxetine (CYMBALTA) 30 MG capsule Take 2 capsules (60 mg total) by mouth daily. *MUST SCHEDULE APPT FOR REFILLS* 30 capsule 0  . furosemide (LASIX) 20 MG tablet TAKE ONE TABLET BY MOUTH DAILY AS NEEDED FOR ANKLE SWELLING 90 tablet 0  . hydrOXYzine (ATARAX/VISTARIL) 25 MG tablet TAKE 1-2 TABLETS BY MOUTH 3 TIMES DAILY. 90 tablet 1  . Insulin Pen Needle (BD PEN NEEDLE NANO U/F) 32G X  4 MM MISC USE WITH VICTOZA ONCE DAILY 100 each 2  . levocetirizine (XYZAL) 5 MG tablet Take 1 tablet (5 mg total) by mouth every evening. 90 tablet 4  . meloxicam (MOBIC) 15 MG tablet Take 1 tablet (15 mg total) by mouth daily. Needs appt for refills 30 tablet 0  . montelukast (SINGULAIR) 10 MG tablet Take 1 tablet (10 mg total) by mouth at bedtime. 90 tablet 4  . OLANZapine (ZYPREXA) 5 MG tablet Take 1 tablet (5 mg total) by mouth at bedtime. 90 tablet 1  . Oxycodone HCl 10 MG TABS TAKE 1 TABELT BY MOUTH EVERY 6 HOURS FOR 30 DAYS  0  . predniSONE (DELTASONE) 50 MG tablet One tab PO daily for 5 days. 5 tablet 0  . Semaglutide, 1 MG/DOSE, (OZEMPIC, 1 MG/DOSE,) 2 MG/1.5ML SOPN Inject 1 mg into the skin once a week. 12 pen 1  . Tiotropium Bromide Monohydrate (SPIRIVA RESPIMAT) 2.5 MCG/ACT AERS Inhale 2 puffs into the lungs daily. 3 Inhaler 4  . topiramate (TOPAMAX) 50 MG tablet Take one tablet twice a day. 180 tablet 1   No current facility-administered medications for this visit.    Allergies  Allergen Reactions  . Sulfa Antibiotics     Patient had reaction as a child; does not know what type of reaction  . Vistaril [Hydroxyzine Hcl]     Per patient she had increased itching with medication.        Objective:  BP 121/76   Pulse 86   Temp 98.3 F (36.8 C) (Oral)   Ht 5\' 5"  (1.651 m)   Wt 233 lb (105.7 kg)   SpO2 94%   BMI 38.77 kg/m  Gen:  alert, not ill-appearing, no distress, appropriate for age HEENT: head normocephalic without obvious abnormality, conjunctiva and cornea clear, trachea midline Pulm: Normal work of breathing, normal phonation, clear to auscultation bilaterally, no wheezes, rales or rhonchi CV: Normal rate, regular rhythm, s1 and s2 distinct, no murmurs, clicks or rubs  Neuro: alert and oriented x 3, no tremor MSK: extremities atraumatic, normal gait and station Skin: intact, no rashes on exposed skin, no jaundice, no cyanosis Psych: well-groomed, cooperative,  good eye contact, euthymic mood, affect mood-congruent, speech is articulate, and thought processes clear and goal-directed    No results found for this or any previous visit (from the past 72 hour(s)). No results found.    Assessment and Plan: 73 y.o. female with   .  Madeliene was seen today for cough.  Diagnoses and all orders for this visit:  Chronic obstructive pulmonary disease with acute lower respiratory infection ([place])  Acute lower respiratory infection -     DG Chest 2 View -     doxycycline (VIBRA-TABS) 100 MG tablet; Take 1 tablet (100 mg total) by mouth 2 (two) times daily for 7 days.     Afebrile, no tachypnea, no tachycardia, relative hypoxia of 93 to 94% on room air at rest, no adventitious lung sounds Risk factors for pneumonia include age greater than 73, diabetes, history of pneumonia, COPD Treating empirically for community acquired pneumonia/COPD exacerbation. Recently had Zithromax 3 months ago so will use Doxycycline CXR pending to assess for infiltrate No dyspnea or increased sputum production, will defer Prednisone for now Counseled on supportive care  Patient education and anticipatory guidance given Patient agrees with treatment plan Follow-up as needed if symptoms worsen or fail to improve  Darlyne Russian PA-C

## 2018-06-27 ENCOUNTER — Other Ambulatory Visit: Payer: Self-pay | Admitting: Physician Assistant

## 2018-06-27 DIAGNOSIS — E7849 Other hyperlipidemia: Secondary | ICD-10-CM

## 2018-07-10 DIAGNOSIS — I739 Peripheral vascular disease, unspecified: Secondary | ICD-10-CM | POA: Diagnosis not present

## 2018-07-10 DIAGNOSIS — L603 Nail dystrophy: Secondary | ICD-10-CM | POA: Diagnosis not present

## 2018-07-10 DIAGNOSIS — L84 Corns and callosities: Secondary | ICD-10-CM | POA: Diagnosis not present

## 2018-07-10 DIAGNOSIS — E1151 Type 2 diabetes mellitus with diabetic peripheral angiopathy without gangrene: Secondary | ICD-10-CM | POA: Diagnosis not present

## 2018-07-18 ENCOUNTER — Other Ambulatory Visit: Payer: Self-pay | Admitting: Physician Assistant

## 2018-07-18 DIAGNOSIS — M25512 Pain in left shoulder: Secondary | ICD-10-CM

## 2018-07-22 IMAGING — DX DG SHOULDER 2+V*L*
3 series · 3 of 3 positions shown · non-contrast
Comparison: None.

CLINICAL DATA: Pt fell a week ago and is having left shoulder pain
radiating to her fingers

EXAM:
LEFT SHOULDER - 2+ VIEW

[shoulder grashey]
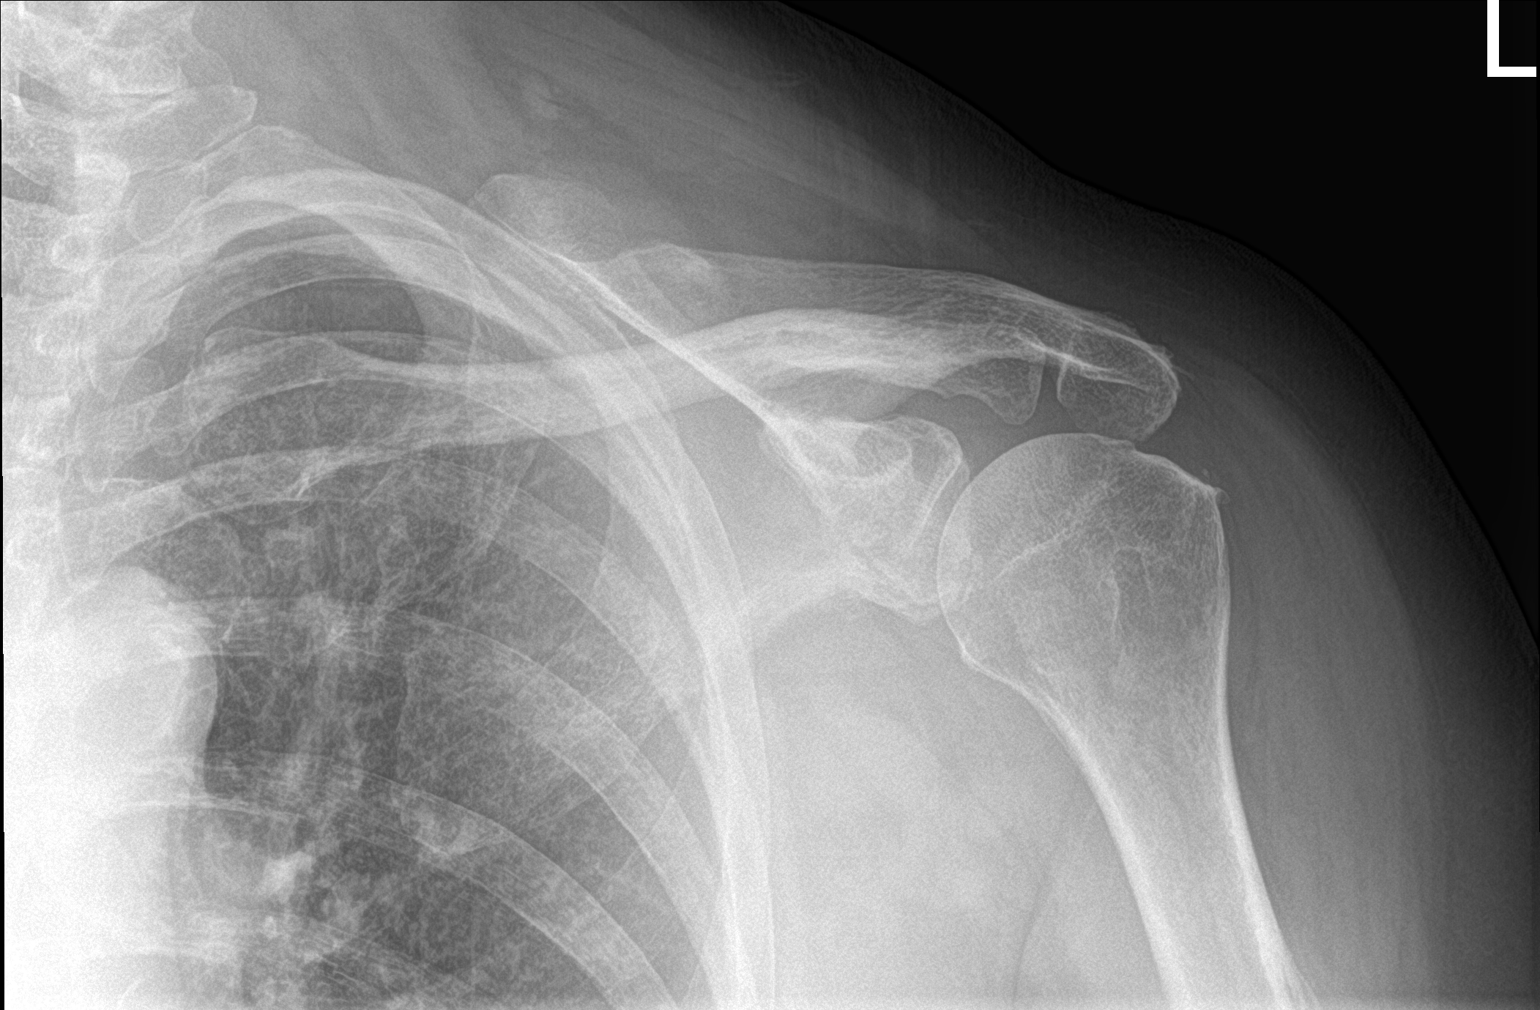

[shoulder y view]
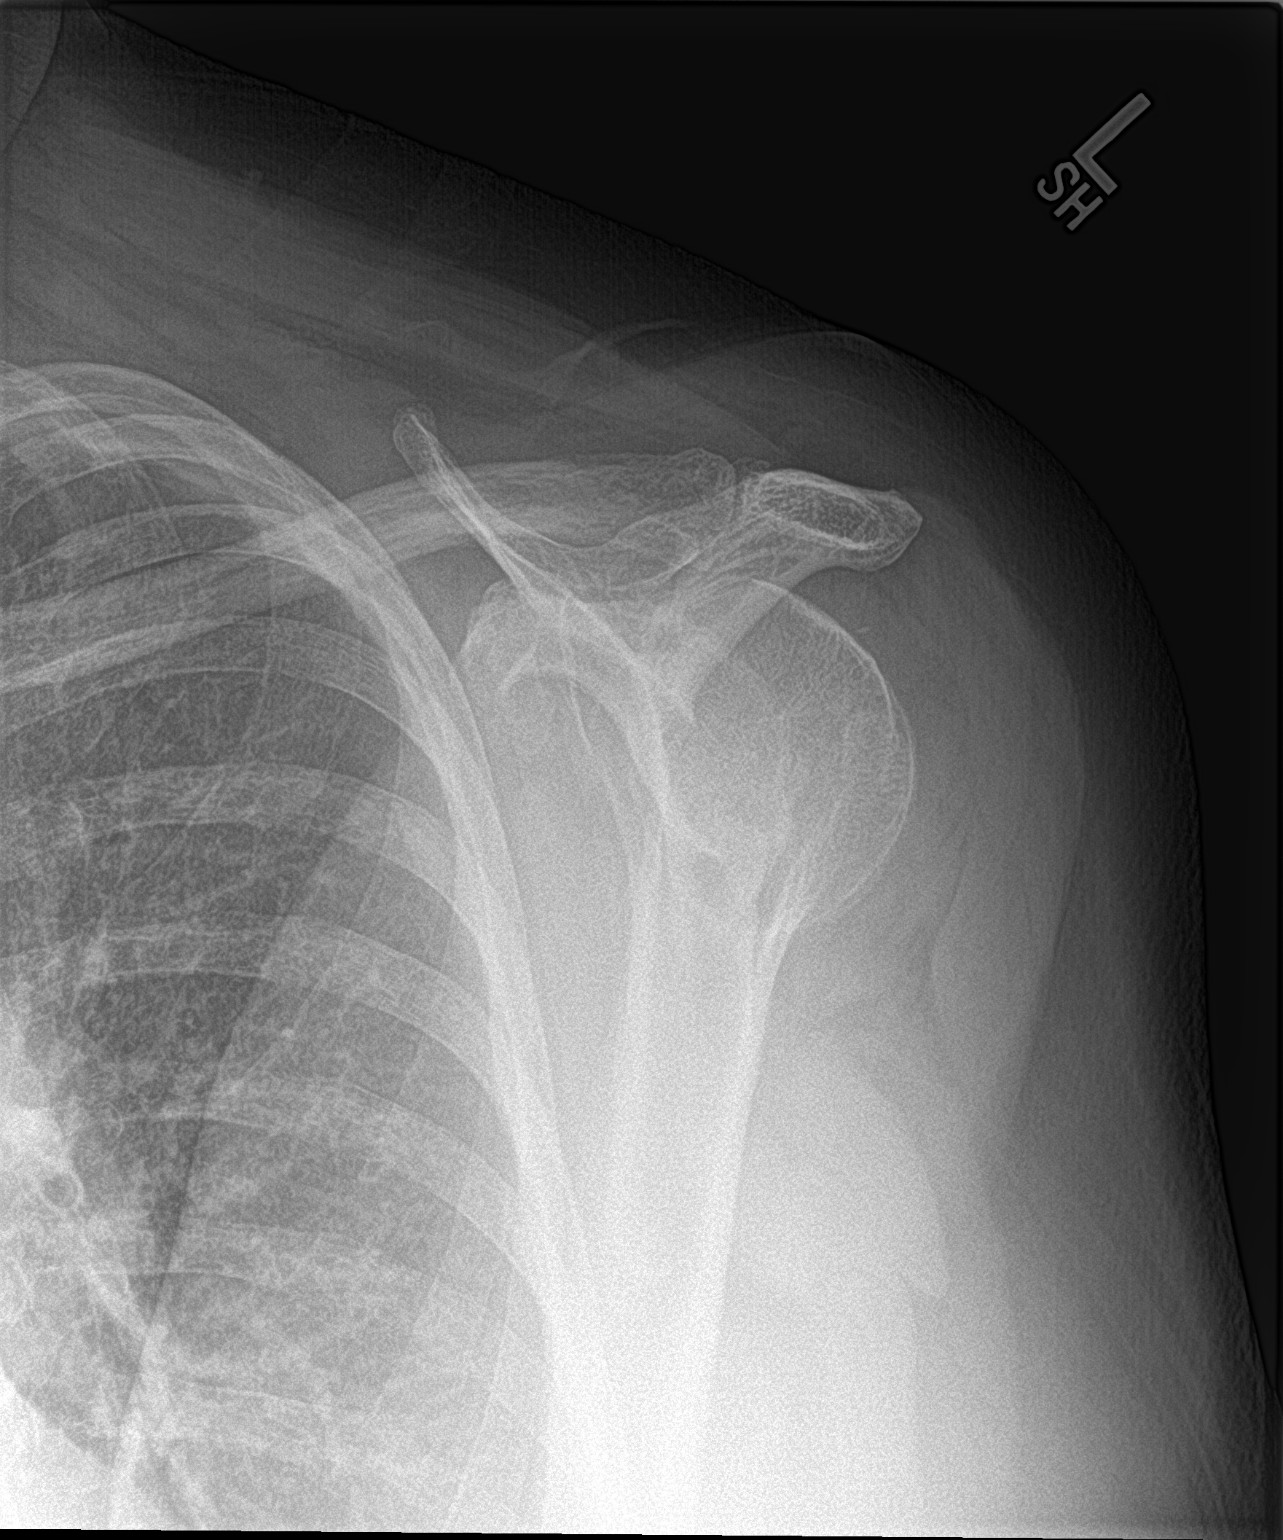

[shoulder axillary]
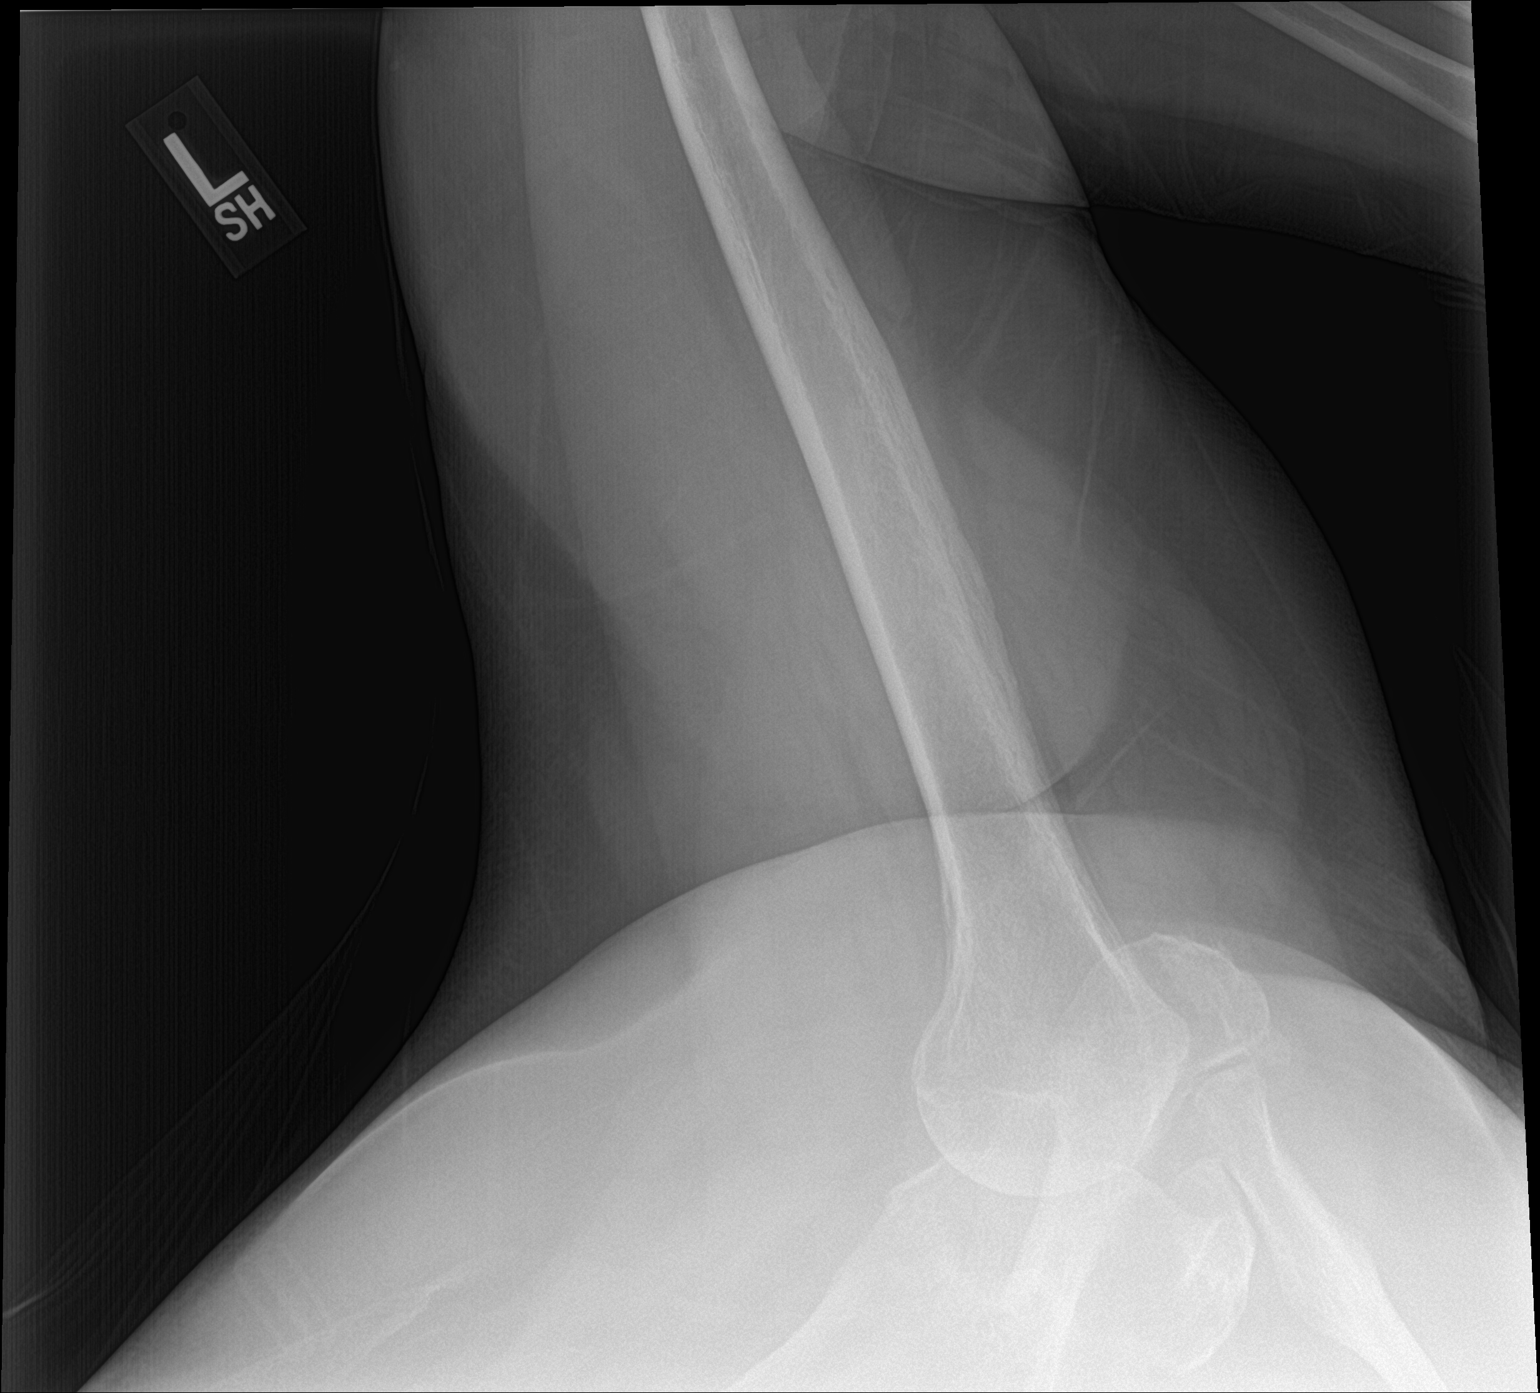

[3 of 3 positions shown; findings below may reference images not displayed]

FINDINGS: No fracture or bone lesion.

The glenohumeral joint is normally spaced and aligned.

The AC joint is normally spaced and aligned with no arthropathic
change.

Bones are demineralized.

Soft tissues are unremarkable.
IMPRESSION: 1. No acute fracture or dislocation.

## 2018-08-02 ENCOUNTER — Encounter: Payer: Self-pay | Admitting: Physician Assistant

## 2018-08-07 NOTE — Telephone Encounter (Signed)
Pt scheduled  

## 2018-08-09 ENCOUNTER — Telehealth (INDEPENDENT_AMBULATORY_CARE_PROVIDER_SITE_OTHER): Payer: Medicare Other | Admitting: Physician Assistant

## 2018-08-09 VITALS — Ht 65.0 in | Wt 226.0 lb

## 2018-08-09 DIAGNOSIS — J411 Mucopurulent chronic bronchitis: Secondary | ICD-10-CM

## 2018-08-09 DIAGNOSIS — I872 Venous insufficiency (chronic) (peripheral): Secondary | ICD-10-CM | POA: Diagnosis not present

## 2018-08-09 DIAGNOSIS — G2581 Restless legs syndrome: Secondary | ICD-10-CM

## 2018-08-09 MED ORDER — FUROSEMIDE 20 MG PO TABS: ORAL_TABLET | ORAL | 0 refills | Status: DC

## 2018-08-09 MED ORDER — ROPINIROLE HCL 0.25 MG PO TABS: ORAL_TABLET | ORAL | 1 refills | Status: DC

## 2018-08-10 ENCOUNTER — Other Ambulatory Visit: Payer: Self-pay | Admitting: Physician Assistant

## 2018-08-10 DIAGNOSIS — M25512 Pain in left shoulder: Secondary | ICD-10-CM

## 2018-08-12 ENCOUNTER — Encounter: Payer: Self-pay | Admitting: Physician Assistant

## 2018-08-12 DIAGNOSIS — G2581 Restless legs syndrome: Secondary | ICD-10-CM | POA: Insufficient documentation

## 2018-08-12 NOTE — Progress Notes (Signed)
Patient ID: Sarah Wright, female   DOB: December 14, 1945, 73 y.o.   MRN: [id number] .[name] KitchenVirtual Visit via Video Note  I connected with Sarah Wright on 08/12/18 at  4:00 PM EDT by a video enabled telemedicine application and verified that I am speaking with the correct person using two identifiers.   I discussed the limitations of evaluation and management by telemedicine and the availability of in person appointments. The patient expressed understanding and agreed to proceed.  History of Present Illness: Pt is a 73 yo female with T2DM, COLD, Chronic venous insufficiency, chronic back pain who calls in to discuss problems with "legs jumping at night". She has notice this worsening over the past month or so. Seems to happen when she stays up late at night. She admits she has been doing this more and more during Sarah Wright pandemic. Her legs "feel like they are going to jump out of her skin". This prevents her from going to sleep.   She has noticed some increase in legs and hands swelling. SOB has not changed from baseline COPD SOB. No major weight increase. infact she continues to work on weight and continues to lose weight. Ozempic is really helping treat sugars and weight.   She does need albuterol refill. She continues on spiriva with no complaints.   .. Active Ambulatory Problems    Diagnosis Date Noted  . Anxiety and depression 03/16/2014  . Hyperlipidemia 03/16/2014  . COLD (chronic obstructive lung disease) ([place]) 03/16/2014  . Insomnia 03/16/2014  . Esophageal reflux 03/16/2014  . Bilateral hand pain 03/16/2014  . DDD (degenerative disc disease), lumbar 03/16/2014  . Lumbar spondylosis 03/17/2014  . Aortic atherosclerosis ([place]) 03/17/2014  . Chronic venous insufficiency 03/27/2014  . H/O colonoscopy 04/02/2014  . Thyroid disease 04/02/2014  . Cataract 04/02/2014  . Type 2 diabetes mellitus ([place]) 04/02/2014  . Primary osteoarthritis of both knees 07/21/2014  . Morbid obesity ([place]) 10/07/2014   . Dizziness 01/04/2015  . Sensorineural hearing loss of both ears 01/26/2015  . Thyroid nodule 06/22/2015  . Memory loss 07/29/2015  . Psoriasis 09/23/2015  . Right shoulder injury 01/27/2016  . Balance problem 08/13/2016  . Left shoulder pain 08/13/2016  . Numbness around mouth 09/08/2016  . DDD (degenerative disc disease), cervical 12/29/2016  . Microalbuminuria 01/30/2017  . Chronic pain syndrome 05/06/2017  . Skin abrasion 08/01/2017  . Leukocytes in urine 08/01/2017  . Dysuria 08/01/2017  . Panic attack 08/01/2017   Resolved Ambulatory Problems    Diagnosis Date Noted  . No Resolved Ambulatory Problems   Past Medical History:  Diagnosis Date  . Diabetes mellitus without complication ([place])    Reviewed med, allergy, problem list.     Observations/Objective: No acute distress.  No labored breathing.  Normal mood.  Scant swelling noted of hands and ankles via mychart video.   .. Today's Vitals   08/09/18 1519  Weight: 226 lb (102.5 kg)  Height: 5\' 5"  (1.651 m)   Body mass index is 37.61 kg/m.   Assessment and Plan: [name] KitchenMarland KitchenLuanne was seen today for insomnia.  Diagnoses and all orders for this visit:  RLS (restless legs syndrome) -     rOPINIRole (REQUIP) 0.25 MG tablet; Take 1-3 tablets as needed for restless legs.  Chronic venous insufficiency -     furosemide (LASIX) 20 MG tablet; TAKE ONE TABLET BY MOUTH DAILY AS NEEDED FOR ANKLE SWELLING  Mucopurulent chronic bronchitis ([facility])   Pt's symptoms sound consisient with RLS. Discussed conservative treatment for this. Given requip  to try and can increase up to 3 tablets. Follow up in 4 weeks.   Refilled as needed lasix. Encouraged patient not to sit for long periods of time without elevating legs. Take frequent short walks. Consider compression stockings or at least tight socks. Watch out for eating too much salt or processed foods.   Refilled albuterol. Continue on spiriva. Discussed COVID restrictions due to  high risk.   Follow Up Instructions:    I discussed the assessment and treatment plan with the patient. The patient was provided an opportunity to ask questions and all were answered. The patient agreed with the plan and demonstrated an understanding of the instructions.   The patient was advised to call back or seek an in-person evaluation if the symptoms worsen or if the condition fails to improve as anticipated.  I provided 25 minutes of non-face-to-face time during this encounter.   [name], PA-C

## 2018-08-30 ENCOUNTER — Other Ambulatory Visit: Payer: Self-pay | Admitting: Physician Assistant

## 2018-08-30 DIAGNOSIS — F329 Major depressive disorder, single episode, unspecified: Secondary | ICD-10-CM

## 2018-08-30 DIAGNOSIS — F419 Anxiety disorder, unspecified: Secondary | ICD-10-CM

## 2018-08-30 DIAGNOSIS — F32A Depression, unspecified: Secondary | ICD-10-CM

## 2018-08-30 DIAGNOSIS — G894 Chronic pain syndrome: Secondary | ICD-10-CM

## 2018-08-31 ENCOUNTER — Other Ambulatory Visit: Payer: Self-pay | Admitting: Physician Assistant

## 2018-08-31 DIAGNOSIS — G2581 Restless legs syndrome: Secondary | ICD-10-CM

## 2018-09-02 DIAGNOSIS — M5136 Other intervertebral disc degeneration, lumbar region: Secondary | ICD-10-CM | POA: Diagnosis not present

## 2018-09-02 DIAGNOSIS — M47816 Spondylosis without myelopathy or radiculopathy, lumbar region: Secondary | ICD-10-CM | POA: Diagnosis not present

## 2018-09-02 DIAGNOSIS — M48061 Spinal stenosis, lumbar region without neurogenic claudication: Secondary | ICD-10-CM | POA: Diagnosis not present

## 2018-09-02 DIAGNOSIS — M533 Sacrococcygeal disorders, not elsewhere classified: Secondary | ICD-10-CM | POA: Diagnosis not present

## 2018-09-04 ENCOUNTER — Other Ambulatory Visit: Payer: Self-pay | Admitting: Physician Assistant

## 2018-09-04 DIAGNOSIS — F410 Panic disorder [episodic paroxysmal anxiety] without agoraphobia: Secondary | ICD-10-CM

## 2018-09-09 ENCOUNTER — Other Ambulatory Visit: Payer: Self-pay | Admitting: Physician Assistant

## 2018-09-09 DIAGNOSIS — F32A Depression, unspecified: Secondary | ICD-10-CM

## 2018-09-09 DIAGNOSIS — F419 Anxiety disorder, unspecified: Secondary | ICD-10-CM

## 2018-09-09 DIAGNOSIS — G894 Chronic pain syndrome: Secondary | ICD-10-CM

## 2018-09-09 DIAGNOSIS — F329 Major depressive disorder, single episode, unspecified: Secondary | ICD-10-CM

## 2018-09-10 ENCOUNTER — Other Ambulatory Visit: Payer: Self-pay | Admitting: Physician Assistant

## 2018-09-10 ENCOUNTER — Telehealth (INDEPENDENT_AMBULATORY_CARE_PROVIDER_SITE_OTHER): Payer: Medicare Other | Admitting: Physician Assistant

## 2018-09-10 DIAGNOSIS — E119 Type 2 diabetes mellitus without complications: Secondary | ICD-10-CM | POA: Diagnosis not present

## 2018-09-10 DIAGNOSIS — F419 Anxiety disorder, unspecified: Secondary | ICD-10-CM

## 2018-09-10 DIAGNOSIS — F329 Major depressive disorder, single episode, unspecified: Secondary | ICD-10-CM | POA: Diagnosis not present

## 2018-09-10 DIAGNOSIS — G2581 Restless legs syndrome: Secondary | ICD-10-CM | POA: Diagnosis not present

## 2018-09-10 DIAGNOSIS — R413 Other amnesia: Secondary | ICD-10-CM

## 2018-09-10 DIAGNOSIS — E7849 Other hyperlipidemia: Secondary | ICD-10-CM

## 2018-09-10 DIAGNOSIS — G894 Chronic pain syndrome: Secondary | ICD-10-CM

## 2018-09-10 DIAGNOSIS — M25512 Pain in left shoulder: Secondary | ICD-10-CM | POA: Diagnosis not present

## 2018-09-10 DIAGNOSIS — F410 Panic disorder [episodic paroxysmal anxiety] without agoraphobia: Secondary | ICD-10-CM

## 2018-09-10 MED ORDER — HYDROXYZINE HCL 25 MG PO TABS: ORAL_TABLET | ORAL | 1 refills | Status: DC

## 2018-09-10 MED ORDER — SEMAGLUTIDE (1 MG/DOSE) 2 MG/1.5ML ~~LOC~~ SOPN: 1.0000 mg | PEN_INJECTOR | SUBCUTANEOUS | 1 refills | Status: DC

## 2018-09-10 MED ORDER — BUPROPION HCL ER (XL) 150 MG PO TB24: 300.0000 mg | ORAL_TABLET | Freq: Every day | ORAL | 1 refills | Status: DC

## 2018-09-10 MED ORDER — DULOXETINE HCL 30 MG PO CPEP: 60.0000 mg | ORAL_CAPSULE | Freq: Every day | ORAL | 1 refills | Status: DC

## 2018-09-10 MED ORDER — DONEPEZIL HCL 10 MG PO TABS: ORAL_TABLET | ORAL | 1 refills | Status: DC

## 2018-09-10 MED ORDER — MELOXICAM 15 MG PO TABS: 15.0000 mg | ORAL_TABLET | Freq: Every day | ORAL | 3 refills | Status: DC

## 2018-09-10 MED ORDER — TOPIRAMATE 50 MG PO TABS: ORAL_TABLET | ORAL | 1 refills | Status: DC

## 2018-09-10 MED ORDER — OLANZAPINE 5 MG PO TABS: 5.0000 mg | ORAL_TABLET | Freq: Every day | ORAL | 1 refills | Status: DC

## 2018-09-10 MED ORDER — ROPINIROLE HCL 0.25 MG PO TABS: ORAL_TABLET | ORAL | 1 refills | Status: DC

## 2018-09-10 MED ORDER — ATORVASTATIN CALCIUM 20 MG PO TABS: 20.0000 mg | ORAL_TABLET | Freq: Every day | ORAL | 3 refills | Status: DC

## 2018-09-10 NOTE — Progress Notes (Deleted)
4 week follow up after starting Requip for RLS. Patient states doing better. Requesting refill on Meloxicam.

## 2018-09-10 NOTE — Progress Notes (Signed)
..Virtual Visit via Telephone Note  I connected with Sarah Wright on 09/11/18 at  1:20 PM EDT by telephone and verified that I am speaking with the correct person using two identifiers.  Location: Patient: home Provider: home   I discussed the limitations, risks, security and privacy concerns of performing an evaluation and management service by telephone and the availability of in person appointments. I also discussed with the patient that there may be a patient responsible charge related to this service. The patient expressed understanding and agreed to proceed.   History of Present Illness: Pt is a 73 yo female with T2DM, hypothyroidism, COPD, GAD, MDD who calls into the clinic for refills.   She is doing much better with sleeping on requip. She is taking 3 tablets and doing great. No problems or concerns.   She is not checking her sugars. She denies any hypoglycemic events. No open sores or wounds.   Her breathing is well controlled.   Her mood is well controlled.   Her pain is being managed by pain clinic.   .. Active Ambulatory Problems    Diagnosis Date Noted  . Anxiety and depression 03/16/2014  . Hyperlipidemia 03/16/2014  . COLD (chronic obstructive lung disease) ([place]) 03/16/2014  . Insomnia 03/16/2014  . Esophageal reflux 03/16/2014  . Bilateral hand pain 03/16/2014  . DDD (degenerative disc disease), lumbar 03/16/2014  . Lumbar spondylosis 03/17/2014  . Aortic atherosclerosis ([place]) 03/17/2014  . Chronic venous insufficiency 03/27/2014  . H/O colonoscopy 04/02/2014  . Thyroid disease 04/02/2014  . Cataract 04/02/2014  . Type 2 diabetes mellitus ([place]) 04/02/2014  . Primary osteoarthritis of both knees 07/21/2014  . Morbid obesity ([place]) 10/07/2014  . Dizziness 01/04/2015  . Sensorineural hearing loss of both ears 01/26/2015  . Thyroid nodule 06/22/2015  . Memory loss 07/29/2015  . Psoriasis 09/23/2015  . Right shoulder injury 01/27/2016  . Balance problem  08/13/2016  . Left shoulder pain 08/13/2016  . Numbness around mouth 09/08/2016  . DDD (degenerative disc disease), cervical 12/29/2016  . Microalbuminuria 01/30/2017  . Chronic pain syndrome 05/06/2017  . Skin abrasion 08/01/2017  . Leukocytes in urine 08/01/2017  . Dysuria 08/01/2017  . Panic attack 08/01/2017  . RLS (restless legs syndrome) 08/12/2018   Resolved Ambulatory Problems    Diagnosis Date Noted  . No Resolved Ambulatory Problems   Past Medical History:  Diagnosis Date  . Diabetes mellitus without complication ([place])     Reviewed med, allergy, problem list.    Observations/Objective: No acute distress. Normal breathing. Normal mood.  .. Today's Vitals   09/10/18 1008  Weight: 226 lb (102.5 kg)  Height: 5\' 5"  (1.651 m)   Body mass index is 37.61 kg/m.   Assessment and Plan: [name] KitchenMarland KitchenLuanne was seen today for pain.  Diagnoses and all orders for this visit:  Left shoulder pain, unspecified chronicity -     meloxicam (MOBIC) 15 MG tablet; Take 1 tablet (15 mg total) by mouth daily.  Other hyperlipidemia -     atorvastatin (LIPITOR) 20 MG tablet; Take 1 tablet (20 mg total) by mouth daily. -     Lipid Panel w/reflex Direct LDL  Anxiety and depression -     buPROPion (WELLBUTRIN XL) 150 MG 24 hr tablet; Take 2 tablets (300 mg total) by mouth daily. -     DULoxetine (CYMBALTA) 30 MG capsule; Take 2 capsules (60 mg total) by mouth daily. -     OLANZapine (ZYPREXA) 5 MG tablet; Take 1 tablet (5  mg total) by mouth at bedtime.  Memory loss -     donepezil (ARICEPT) 10 MG tablet; Take one tablet daily.  Chronic pain syndrome -     DULoxetine (CYMBALTA) 30 MG capsule; Take 2 capsules (60 mg total) by mouth daily.  Panic attack -     hydrOXYzine (ATARAX/VISTARIL) 25 MG tablet; TAKE 1 TO 2 TABLETS BY MOUTH 3 TIMES A DAY  RLS (restless legs syndrome) -     rOPINIRole (REQUIP) 0.25 MG tablet; Take 3 tablets as needed nightly for restless legs. -     COMPLETE  METABOLIC PANEL WITH GFR  Type 2 diabetes mellitus without complication, without long-term current use of insulin (HCC) -     Semaglutide, 1 MG/DOSE, (OZEMPIC, 1 MG/DOSE,) 2 MG/1.5ML SOPN; Inject 1 mg into the skin once a week. -     COMPLETE METABOLIC PANEL WITH GFR -     Hemoglobin A1c  Morbid obesity (HCC) -     topiramate (TOPAMAX) 50 MG tablet; Take one tablet twice a day. -     COMPLETE METABOLIC PANEL WITH GFR   Pt needs labs. Ordered today.  Refilled needed medications.   Refilled requip. She has been on low dose zyprexa for years. She is having no CNS issues with this combination. She is doing well. Continue on medication.   Follow up in 3 months.   Follow Up Instructions:    I discussed the assessment and treatment plan with the patient. The patient was provided an opportunity to ask questions and all were answered. The patient agreed with the plan and demonstrated an understanding of the instructions.   The patient was advised to call back or seek an in-person evaluation if the symptoms worsen or if the condition fails to improve as anticipated.  I provided 15 minutes of non-face-to-face time during this encounter.   [name], PA-C

## 2018-09-30 DIAGNOSIS — M47816 Spondylosis without myelopathy or radiculopathy, lumbar region: Secondary | ICD-10-CM | POA: Diagnosis not present

## 2018-09-30 DIAGNOSIS — M533 Sacrococcygeal disorders, not elsewhere classified: Secondary | ICD-10-CM | POA: Diagnosis not present

## 2018-09-30 DIAGNOSIS — M48061 Spinal stenosis, lumbar region without neurogenic claudication: Secondary | ICD-10-CM | POA: Diagnosis not present

## 2018-09-30 DIAGNOSIS — M5136 Other intervertebral disc degeneration, lumbar region: Secondary | ICD-10-CM | POA: Diagnosis not present

## 2018-10-11 DIAGNOSIS — L84 Corns and callosities: Secondary | ICD-10-CM | POA: Diagnosis not present

## 2018-10-11 DIAGNOSIS — L603 Nail dystrophy: Secondary | ICD-10-CM | POA: Diagnosis not present

## 2018-10-11 DIAGNOSIS — E1151 Type 2 diabetes mellitus with diabetic peripheral angiopathy without gangrene: Secondary | ICD-10-CM | POA: Diagnosis not present

## 2018-10-11 DIAGNOSIS — I739 Peripheral vascular disease, unspecified: Secondary | ICD-10-CM | POA: Diagnosis not present

## 2018-10-14 ENCOUNTER — Encounter: Payer: Self-pay | Admitting: Physician Assistant

## 2018-10-14 ENCOUNTER — Ambulatory Visit (INDEPENDENT_AMBULATORY_CARE_PROVIDER_SITE_OTHER): Payer: Medicare Other | Admitting: Physician Assistant

## 2018-10-14 VITALS — BP 119/47 | HR 67 | Temp 97.7°F | Ht 65.0 in | Wt 231.0 lb

## 2018-10-14 DIAGNOSIS — R3915 Urgency of urination: Secondary | ICD-10-CM

## 2018-10-14 LAB — POCT URINALYSIS DIPSTICK
Bilirubin, UA: NEGATIVE
Glucose, UA: NEGATIVE
Ketones, UA: NEGATIVE
Nitrite, UA: NEGATIVE
Protein, UA: NEGATIVE
Spec Grav, UA: <=1.005 — AB (ref 1.010–1.025)
Urobilinogen, UA: 0.2 E.U./dL
pH, UA: 7 (ref 5.0–8.0)

## 2018-10-14 MED ORDER — NITROFURANTOIN MONOHYD MACRO 100 MG PO CAPS: 100.0000 mg | ORAL_CAPSULE | Freq: Two times a day (BID) | ORAL | 0 refills | Status: DC

## 2018-10-14 NOTE — Patient Instructions (Signed)
Urinary Tract Infection, Adult A urinary tract infection (UTI) is an infection of any part of the urinary tract. The urinary tract includes:  The kidneys.  The ureters.  The bladder.  The urethra. These organs make, store, and get rid of pee (urine) in the body. What are the causes? This is caused by germs (bacteria) in your genital area. These germs grow and cause swelling (inflammation) of your urinary tract. What increases the risk? You are more likely to develop this condition if:  You have a small, thin tube (catheter) to drain pee.  You cannot control when you pee or poop (incontinence).  You are female, and: ? You use these methods to prevent pregnancy: ? A medicine that kills sperm (spermicide). ? A device that blocks sperm (diaphragm). ? You have low levels of a female hormone (estrogen). ? You are pregnant.  You have genes that add to your risk.  You are sexually active.  You take antibiotic medicines.  You have trouble peeing because of: ? A prostate that is bigger than normal, if you are female. ? A blockage in the part of your body that drains pee from the bladder (urethra). ? A kidney stone. ? A nerve condition that affects your bladder (neurogenic bladder). ? Not getting enough to drink. ? Not peeing often enough.  You have other conditions, such as: ? Diabetes. ? A weak disease-fighting system (immune system). ? Sickle cell disease. ? Gout. ? Injury of the spine. What are the signs or symptoms? Symptoms of this condition include:  Needing to pee right away (urgently).  Peeing often.  Peeing small amounts often.  Pain or burning when peeing.  Blood in the pee.  Pee that smells bad or not like normal.  Trouble peeing.  Pee that is cloudy.  Fluid coming from the vagina, if you are female.  Pain in the belly or lower back. Other symptoms include:  Throwing up (vomiting).  No urge to eat.  Feeling mixed up (confused).  Being tired  and grouchy (irritable).  A fever.  Watery poop (diarrhea). How is this treated? This condition may be treated with:  Antibiotic medicine.  Other medicines.  Drinking enough water. Follow these instructions at home:  Medicines  Take over-the-counter and prescription medicines only as told by your doctor.  If you were prescribed an antibiotic medicine, take it as told by your doctor. Do not stop taking it even if you start to feel better. General instructions  Make sure you: ? Pee until your bladder is empty. ? Do not hold pee for a long time. ? Empty your bladder after sex. ? Wipe from front to back after pooping if you are a female. Use each tissue one time when you wipe.  Drink enough fluid to keep your pee pale yellow.  Keep all follow-up visits as told by your doctor. This is important. Contact a doctor if:  You do not get better after 1-2 days.  Your symptoms go away and then come back. Get help right away if:  You have very bad back pain.  You have very bad pain in your lower belly.  You have a fever.  You are sick to your stomach (nauseous).  You are throwing up. Summary  A urinary tract infection (UTI) is an infection of any part of the urinary tract.  This condition is caused by germs in your genital area.  There are many risk factors for a UTI. These include having a small, thin   tube to drain pee and not being able to control when you pee or poop.  Treatment includes antibiotic medicines for germs.  Drink enough fluid to keep your pee pale yellow. This information is not intended to replace advice given to you by your health care provider. Make sure you discuss any questions you have with your health care provider. Document Released: 09/27/2007 Document Revised: 10/18/2017 Document Reviewed: 10/18/2017 Elsevier Interactive Patient Education  2019 Else[employer] Inc.  

## 2018-10-14 NOTE — Progress Notes (Signed)
Subjective:    Patient ID: Sarah Wright, female    DOB: [birth year], 73 y.o.   MRN: [id number]  HPI  Pt is a 73 yo female with T2DM and hx of UTI's who presents to the clinic with urinary urgency for 2 days. Her last UTI was cultured and showed e.coli with similar symptoms. She denies any odor or discharge. She does notice some itching. No new soaps or washes. She admits to having some loose stools recently. No flank pain, fever, chills, headaches, body aches or nausea. She has not tried anything to make better.   .. Active Ambulatory Problems    Diagnosis Date Noted  . Anxiety and depression 03/16/2014  . Hyperlipidemia 03/16/2014  . COLD (chronic obstructive lung disease) ([place]) 03/16/2014  . Insomnia 03/16/2014  . Esophageal reflux 03/16/2014  . Bilateral hand pain 03/16/2014  . DDD (degenerative disc disease), lumbar 03/16/2014  . Lumbar spondylosis 03/17/2014  . Aortic atherosclerosis ([place]) 03/17/2014  . Chronic venous insufficiency 03/27/2014  . H/O colonoscopy 04/02/2014  . Thyroid disease 04/02/2014  . Cataract 04/02/2014  . Type 2 diabetes mellitus ([place]) 04/02/2014  . Primary osteoarthritis of both knees 07/21/2014  . Morbid obesity ([place]) 10/07/2014  . Dizziness 01/04/2015  . Sensorineural hearing loss of both ears 01/26/2015  . Thyroid nodule 06/22/2015  . Memory loss 07/29/2015  . Psoriasis 09/23/2015  . Right shoulder injury 01/27/2016  . Balance problem 08/13/2016  . Left shoulder pain 08/13/2016  . Numbness around mouth 09/08/2016  . DDD (degenerative disc disease), cervical 12/29/2016  . Microalbuminuria 01/30/2017  . Chronic pain syndrome 05/06/2017  . Skin abrasion 08/01/2017  . Leukocytes in urine 08/01/2017  . Dysuria 08/01/2017  . Panic attack 08/01/2017  . RLS (restless legs syndrome) 08/12/2018   Resolved Ambulatory Problems    Diagnosis Date Noted  . No Resolved Ambulatory Problems   Past Medical History:  Diagnosis Date  . Diabetes mellitus  without complication ([place])       Review of Systems See HPI.     Objective:   Physical Exam Vitals signs reviewed.  Constitutional:      Appearance: Normal appearance.  HENT:     Head: Normocephalic.  Cardiovascular:     Rate and Rhythm: Normal rate and regular rhythm.  Pulmonary:     Effort: Pulmonary effort is normal.     Breath sounds: Normal breath sounds.  Abdominal:     General: There is no distension.     Palpations: Abdomen is soft.     Tenderness: There is abdominal tenderness. There is no right CVA tenderness, left CVA tenderness, guarding or rebound.     Comments: Some mild suprapubic tenderness to palpation.   Neurological:     General: No focal deficit present.     Mental Status: She is alert.  Psychiatric:        Mood and Affect: Mood normal.        Behavior: Behavior normal.           Assessment & Plan:  [name] KitchenMarland KitchenLuanne was seen today for urinary urgency.  Diagnoses and all orders for this visit:  Urinary urgency -     POCT urinalysis dipstick -     Urine Culture -     nitrofurantoin, macrocrystal-monohydrate, (MACROBID) 100 MG capsule; Take 1 capsule (100 mg total) by mouth 2 (two) times daily.   .. Results for orders placed or performed in visit on 10/14/18  POCT urinalysis dipstick  Result Value Ref Range  Color, UA yellow    Clarity, UA clear    Glucose, UA Negative Negative   Bilirubin, UA negative    Ketones, UA negative    Spec Grav, UA <=1.005 (A) 1.010 - 1.025   Blood, UA trace    pH, UA 7.0 5.0 - 8.0   Protein, UA Negative Negative   Urobilinogen, UA 0.2 0.2 or 1.0 E.U./dL   Nitrite, UA negative    Leukocytes, UA Moderate (2+) (A) Negative   Appearance     Odor     Trace blood, moderate leukocytes. Pt hx of UTI's and positive cultures. Will treat empirically and wait for culture. Sent macrobid. Discussed symptomatic care. Follow up as needed.

## 2018-10-16 LAB — URINE CULTURE
MICRO NUMBER:: 593962
SPECIMEN QUALITY:: ADEQUATE

## 2018-10-16 NOTE — Progress Notes (Signed)
E.coli found on culture. Macrobid should treat! Follow up as needed.

## 2018-10-23 ENCOUNTER — Telehealth: Payer: Self-pay | Admitting: Neurology

## 2018-10-23 MED ORDER — AMOXICILLIN-POT CLAVULANATE 875-125 MG PO TABS: 1.0000 | ORAL_TABLET | Freq: Two times a day (BID) | ORAL | 0 refills | Status: DC

## 2018-10-23 NOTE — Telephone Encounter (Signed)
Patient called and left vm stating she feels like her UTI is not completely gone, asked if she should be on medication longer. Please advise. [phone].

## 2018-10-23 NOTE — Telephone Encounter (Signed)
Patient made aware. Will call if needed.

## 2018-10-23 NOTE — Telephone Encounter (Signed)
Sent augmentin which based on culture should also treat e.coli. if still having symptoms after completing the round will need to have another urine culture.

## 2018-10-30 ENCOUNTER — Other Ambulatory Visit: Payer: Self-pay | Admitting: Physician Assistant

## 2018-10-30 DIAGNOSIS — I872 Venous insufficiency (chronic) (peripheral): Secondary | ICD-10-CM

## 2018-11-12 ENCOUNTER — Encounter: Payer: Self-pay | Admitting: Physician Assistant

## 2018-11-12 ENCOUNTER — Telehealth (INDEPENDENT_AMBULATORY_CARE_PROVIDER_SITE_OTHER): Payer: Medicare Other | Admitting: Physician Assistant

## 2018-11-12 VITALS — Temp 98.6°F | Ht 65.0 in | Wt 231.0 lb

## 2018-11-12 DIAGNOSIS — R300 Dysuria: Secondary | ICD-10-CM

## 2018-11-12 DIAGNOSIS — Z87440 Personal history of urinary (tract) infections: Secondary | ICD-10-CM | POA: Diagnosis not present

## 2018-11-12 DIAGNOSIS — R3915 Urgency of urination: Secondary | ICD-10-CM | POA: Diagnosis not present

## 2018-11-12 DIAGNOSIS — R350 Frequency of micturition: Secondary | ICD-10-CM

## 2018-11-12 DIAGNOSIS — R110 Nausea: Secondary | ICD-10-CM | POA: Diagnosis not present

## 2018-11-12 MED ORDER — ONDANSETRON 8 MG PO TBDP: 8.0000 mg | ORAL_TABLET | Freq: Three times a day (TID) | ORAL | 1 refills | Status: DC | PRN

## 2018-11-12 MED ORDER — AMOXICILLIN-POT CLAVULANATE 875-125 MG PO TABS: 1.0000 | ORAL_TABLET | Freq: Two times a day (BID) | ORAL | 0 refills | Status: DC

## 2018-11-12 NOTE — Progress Notes (Signed)
Patient ID: Sarah Wright, female   DOB: [birth year], 73 y.o.   MRN: [id number] .[name] KitchenVirtual Visit via Video Note  I connected with Sarah Wright on 11/12/18 at  9:30 AM EDT by a video enabled telemedicine application and verified that I am speaking with the correct person using two identifiers.  Location: Patient: home Provider: home   I discussed the limitations of evaluation and management by telemedicine and the availability of in person appointments. The patient expressed understanding and agreed to proceed.  History of Present Illness: Pt is a 73 yo female with hx of recurrent UTI with recent UTI on 10/14/18 confirmed with E.coli. she was most recently treated with macrobid. Her symptoms completely resolved after treatment. 2 days ago symptoms return with dysuria, frequency and urgency. She also feels real nauseated. No abdominal pain or flank pain. No fever, chills, vomiting.   .. Active Ambulatory Problems    Diagnosis Date Noted  . Anxiety and depression 03/16/2014  . Hyperlipidemia 03/16/2014  . COLD (chronic obstructive lung disease) ([place]) 03/16/2014  . Insomnia 03/16/2014  . Esophageal reflux 03/16/2014  . Bilateral hand pain 03/16/2014  . DDD (degenerative disc disease), lumbar 03/16/2014  . Lumbar spondylosis 03/17/2014  . Aortic atherosclerosis ([place]) 03/17/2014  . Chronic venous insufficiency 03/27/2014  . H/O colonoscopy 04/02/2014  . Thyroid disease 04/02/2014  . Cataract 04/02/2014  . Type 2 diabetes mellitus ([place]) 04/02/2014  . Primary osteoarthritis of both knees 07/21/2014  . Morbid obesity ([place]) 10/07/2014  . Dizziness 01/04/2015  . Sensorineural hearing loss of both ears 01/26/2015  . Thyroid nodule 06/22/2015  . Memory loss 07/29/2015  . Psoriasis 09/23/2015  . Right shoulder injury 01/27/2016  . Balance problem 08/13/2016  . Left shoulder pain 08/13/2016  . Numbness around mouth 09/08/2016  . DDD (degenerative disc disease), cervical 12/29/2016  .  Microalbuminuria 01/30/2017  . Chronic pain syndrome 05/06/2017  . Skin abrasion 08/01/2017  . Leukocytes in urine 08/01/2017  . Dysuria 08/01/2017  . Panic attack 08/01/2017  . RLS (restless legs syndrome) 08/12/2018   Resolved Ambulatory Problems    Diagnosis Date Noted  . No Resolved Ambulatory Problems   Past Medical History:  Diagnosis Date  . Diabetes mellitus without complication ([place])    Reviewed med, allergy and problem list.     Observations/Objective: No acute distress. Normal mood.   .. Today's Vitals   11/12/18 0901  Temp: 98.6 F (37 C)  TempSrc: Oral  Weight: 231 lb (104.8 kg)  Height: 5\' 5"  (1.651 m)   Body mass index is 38.44 kg/m.   Assessment and Plan: [name] KitchenMarland KitchenLuanne was seen today for urinary tract infection.  Diagnoses and all orders for this visit:  Urinary urgency -     amoxicillin-clavulanate (AUGMENTIN) 875-125 MG tablet; Take 1 tablet by mouth 2 (two) times daily. For 10 days.  Urinary frequency -     amoxicillin-clavulanate (AUGMENTIN) 875-125 MG tablet; Take 1 tablet by mouth 2 (two) times daily. For 10 days.  Nausea -     amoxicillin-clavulanate (AUGMENTIN) 875-125 MG tablet; Take 1 tablet by mouth 2 (two) times daily. For 10 days. -     ondansetron (ZOFRAN-ODT) 8 MG disintegrating tablet; Take 1 tablet (8 mg total) by mouth every 8 (eight) hours as needed for nausea.  Dysuria -     amoxicillin-clavulanate (AUGMENTIN) 875-125 MG tablet; Take 1 tablet by mouth 2 (two) times daily. For 10 days.  History of UTI   Pt has symptoms consistent with UTI. She has had  2 confirmed UTIs this year in march and June. Discussed prevention with estrogen cream. She does have intermittent loose stools.consider probiotic.  Discussed proper cleaning. Will go ahead and treat. If not improving by Friday morning then need to come in for urine sample. augmentin sent for UTI based on last culture and zofran for nausea. Follow up as needed.    Follow Up  Instructions:    I discussed the assessment and treatment plan with the patient. The patient was provided an opportunity to ask questions and all were answered. The patient agreed with the plan and demonstrated an understanding of the instructions.   The patient was advised to call back or seek an in-person evaluation if the symptoms worsen or if the condition fails to improve as anticipated.     [name], PA-C

## 2018-11-12 NOTE — Progress Notes (Signed)
Recent UTI went away.   2 days ago started having stomach pain and urinary frequency.

## 2018-11-25 ENCOUNTER — Other Ambulatory Visit: Payer: Self-pay

## 2018-11-29 ENCOUNTER — Other Ambulatory Visit: Payer: Self-pay | Admitting: Physician Assistant

## 2018-11-29 DIAGNOSIS — F410 Panic disorder [episodic paroxysmal anxiety] without agoraphobia: Secondary | ICD-10-CM

## 2018-12-03 DIAGNOSIS — M48061 Spinal stenosis, lumbar region without neurogenic claudication: Secondary | ICD-10-CM | POA: Diagnosis not present

## 2018-12-03 DIAGNOSIS — M533 Sacrococcygeal disorders, not elsewhere classified: Secondary | ICD-10-CM | POA: Diagnosis not present

## 2018-12-03 DIAGNOSIS — M47816 Spondylosis without myelopathy or radiculopathy, lumbar region: Secondary | ICD-10-CM | POA: Diagnosis not present

## 2018-12-03 DIAGNOSIS — M5136 Other intervertebral disc degeneration, lumbar region: Secondary | ICD-10-CM | POA: Diagnosis not present

## 2018-12-10 ENCOUNTER — Other Ambulatory Visit: Payer: Self-pay | Admitting: Physician Assistant

## 2018-12-10 DIAGNOSIS — E119 Type 2 diabetes mellitus without complications: Secondary | ICD-10-CM

## 2018-12-17 DIAGNOSIS — Z23 Encounter for immunization: Secondary | ICD-10-CM | POA: Diagnosis not present

## 2018-12-18 ENCOUNTER — Other Ambulatory Visit: Payer: Self-pay | Admitting: Physician Assistant

## 2018-12-18 DIAGNOSIS — F410 Panic disorder [episodic paroxysmal anxiety] without agoraphobia: Secondary | ICD-10-CM

## 2018-12-29 ENCOUNTER — Other Ambulatory Visit: Payer: Self-pay | Admitting: Physician Assistant

## 2018-12-29 DIAGNOSIS — J42 Unspecified chronic bronchitis: Secondary | ICD-10-CM

## 2019-01-13 DIAGNOSIS — I739 Peripheral vascular disease, unspecified: Secondary | ICD-10-CM | POA: Diagnosis not present

## 2019-01-13 DIAGNOSIS — E1151 Type 2 diabetes mellitus with diabetic peripheral angiopathy without gangrene: Secondary | ICD-10-CM | POA: Diagnosis not present

## 2019-01-13 DIAGNOSIS — L84 Corns and callosities: Secondary | ICD-10-CM | POA: Diagnosis not present

## 2019-01-13 DIAGNOSIS — L603 Nail dystrophy: Secondary | ICD-10-CM | POA: Diagnosis not present

## 2019-01-16 DIAGNOSIS — M5136 Other intervertebral disc degeneration, lumbar region: Secondary | ICD-10-CM | POA: Diagnosis not present

## 2019-01-16 DIAGNOSIS — M47816 Spondylosis without myelopathy or radiculopathy, lumbar region: Secondary | ICD-10-CM | POA: Diagnosis not present

## 2019-01-16 DIAGNOSIS — M48061 Spinal stenosis, lumbar region without neurogenic claudication: Secondary | ICD-10-CM | POA: Diagnosis not present

## 2019-01-16 DIAGNOSIS — M533 Sacrococcygeal disorders, not elsewhere classified: Secondary | ICD-10-CM | POA: Diagnosis not present

## 2019-02-03 ENCOUNTER — Other Ambulatory Visit: Payer: Self-pay | Admitting: Physician Assistant

## 2019-02-03 DIAGNOSIS — F419 Anxiety disorder, unspecified: Secondary | ICD-10-CM

## 2019-02-03 DIAGNOSIS — F32A Depression, unspecified: Secondary | ICD-10-CM

## 2019-02-03 DIAGNOSIS — G894 Chronic pain syndrome: Secondary | ICD-10-CM

## 2019-02-03 DIAGNOSIS — F329 Major depressive disorder, single episode, unspecified: Secondary | ICD-10-CM

## 2019-02-13 ENCOUNTER — Other Ambulatory Visit: Payer: Self-pay | Admitting: Physician Assistant

## 2019-02-13 DIAGNOSIS — F410 Panic disorder [episodic paroxysmal anxiety] without agoraphobia: Secondary | ICD-10-CM

## 2019-03-04 ENCOUNTER — Other Ambulatory Visit: Payer: Self-pay | Admitting: Physician Assistant

## 2019-03-04 DIAGNOSIS — G2581 Restless legs syndrome: Secondary | ICD-10-CM

## 2019-03-04 DIAGNOSIS — E119 Type 2 diabetes mellitus without complications: Secondary | ICD-10-CM

## 2019-03-17 ENCOUNTER — Other Ambulatory Visit: Payer: Self-pay

## 2019-03-18 ENCOUNTER — Other Ambulatory Visit: Payer: Self-pay | Admitting: Physician Assistant

## 2019-03-18 DIAGNOSIS — J42 Unspecified chronic bronchitis: Secondary | ICD-10-CM

## 2019-04-08 ENCOUNTER — Other Ambulatory Visit: Payer: Self-pay | Admitting: Physician Assistant

## 2019-04-08 DIAGNOSIS — R413 Other amnesia: Secondary | ICD-10-CM

## 2019-04-11 ENCOUNTER — Other Ambulatory Visit: Payer: Self-pay | Admitting: Physician Assistant

## 2019-04-11 DIAGNOSIS — F329 Major depressive disorder, single episode, unspecified: Secondary | ICD-10-CM

## 2019-04-11 DIAGNOSIS — F32A Depression, unspecified: Secondary | ICD-10-CM

## 2019-04-11 DIAGNOSIS — J42 Unspecified chronic bronchitis: Secondary | ICD-10-CM

## 2019-04-11 DIAGNOSIS — F419 Anxiety disorder, unspecified: Secondary | ICD-10-CM

## 2019-04-15 ENCOUNTER — Other Ambulatory Visit: Payer: Self-pay | Admitting: Physician Assistant

## 2019-04-18 ENCOUNTER — Other Ambulatory Visit: Payer: Self-pay | Admitting: Physician Assistant

## 2019-04-18 DIAGNOSIS — F410 Panic disorder [episodic paroxysmal anxiety] without agoraphobia: Secondary | ICD-10-CM

## 2019-04-21 NOTE — Telephone Encounter (Signed)
Hi Jade,   I denied her prescription for hydroxyzine.  First of all it is a month early and she is not really due until January is a 90-day supply was sent in October.  Secondly I would encourage you to work with her on trying to decrease her antihistamine use.  She is 73 years old I am concerned that she is using 540 tabs in a 60-day timeframe.  It seems excessive and I do worry about it affecting her mentation.

## 2019-04-22 NOTE — Telephone Encounter (Signed)
I think I happened to refill for psychiatry at one point and kept refilling. I agree high dose.    Sarah Wright can we find out who she sees for mood treatment and see if they can take this back over or communicate to patient the high dose and psychiatry needs to refill.

## 2019-04-23 NOTE — Telephone Encounter (Signed)
Spoke with patient and she states she hasn't seen a psychiatrist in years. She is willing to decrease medication. Please advise.

## 2019-04-23 NOTE — Telephone Encounter (Signed)
Left message on machine for patient to call back.

## 2019-04-24 NOTE — Telephone Encounter (Signed)
How much are you really taking? First goal would be 1 tablet up to three times a day.

## 2019-04-24 NOTE — Telephone Encounter (Signed)
Patient states she has been taking two tablets twice daily, maybe three times daily rarely. She does still have an entire bottle of medication and states she didn't request refill, it must have been the pharmacy. I let her know to take only one three times daily at the most and to call us directly when she needs a refill. She is in agreeable. [place].

## 2019-04-29 DIAGNOSIS — H353231 Exudative age-related macular degeneration, bilateral, with active choroidal neovascularization: Secondary | ICD-10-CM | POA: Diagnosis not present

## 2019-04-29 DIAGNOSIS — H5203 Hypermetropia, bilateral: Secondary | ICD-10-CM | POA: Diagnosis not present

## 2019-04-29 DIAGNOSIS — H524 Presbyopia: Secondary | ICD-10-CM | POA: Diagnosis not present

## 2019-04-29 DIAGNOSIS — E119 Type 2 diabetes mellitus without complications: Secondary | ICD-10-CM | POA: Diagnosis not present

## 2019-04-29 DIAGNOSIS — H52223 Regular astigmatism, bilateral: Secondary | ICD-10-CM | POA: Diagnosis not present

## 2019-04-30 DIAGNOSIS — H35372 Puckering of macula, left eye: Secondary | ICD-10-CM | POA: Diagnosis not present

## 2019-04-30 DIAGNOSIS — H43811 Vitreous degeneration, right eye: Secondary | ICD-10-CM | POA: Diagnosis not present

## 2019-04-30 DIAGNOSIS — H353134 Nonexudative age-related macular degeneration, bilateral, advanced atrophic with subfoveal involvement: Secondary | ICD-10-CM | POA: Diagnosis not present

## 2019-05-12 ENCOUNTER — Telehealth: Payer: Self-pay | Admitting: Neurology

## 2019-05-12 NOTE — Telephone Encounter (Signed)
Patient left vm inquiring about the Covid vaccination.  Called patient back and let her know the following:  As of right now there is no known contraindication to getting the covid vaccine. When you have more risk factors to complications of covid that is more reason to get the vaccine. Sarah Wright has been recommending vaccine to all patients.    Important to remember when people are having the "reactions" they are immune responses. So you don't want to do anything preliminary to block immune response but after if it is more severe and you are uncomfortable you can take a bendardyl and ibuprofen. If having any trouble breathing go to UC or ED. Very few people are having immune responses so strong they need to go to ED but many people are not feeling well for 1 to 2 days after vaccine given.    We currently do not have any information about if our office will be distributing the COVID vaccine. You can contact your local Health Department or go online to  [email or web address] For questions related to vaccine distribution or appointments, please email [email or web address] or call [phone].

## 2019-05-13 ENCOUNTER — Ambulatory Visit: Payer: Medicare Other

## 2019-05-13 NOTE — Progress Notes (Unsigned)
Subjective:   Loleta Janousek is a 74 y.o. female who presents for Medicare Annual (Subsequent) preventive examination.  Review of Systems:  No ROS.  Medicare Wellness Virtual Visit.  Visual/audio telehealth visit, UTA vital signs.   See social history for additional risk factors.      Sleep patterns:    Home Safety/Smoke Alarms: Feels safe in home. Smoke alarms in place.  Living environment;  Seat Belt Safety/Bike Helmet: Wears seat belt.   Female:   Pap- Aged out       Mammo-       Dexa scan-        CCS- UTD     Objective:     Vitals: There were no vitals taken for this visit.  There is no height or weight on file to calculate BMI.  Advanced Directives 03/16/2014  Does Patient Have a Medical Advance Directive? No  Would patient like information on creating a medical advance directive? No - patient declined information    Tobacco Social History   Tobacco Use  Smoking Status Former Smoker  . Quit date: 02/12/2014  . Years since quitting: 5.2  Smokeless Tobacco Never Used     Counseling given: Not Answered   Clinical Intake:                       Past Medical History:  Diagnosis Date  . Diabetes mellitus without complication ([place])   . Hyperlipidemia   . Thyroid disease    Past Surgical History:  Procedure Laterality Date  . ABDOMINAL HYSTERECTOMY    . CARPAL TUNNEL RELEASE     both wrists  . CATARACT EXTRACTION, BILATERAL    . CERVICAL FUSION    . GALLBLADDER SURGERY    . right foot surgery    . THYROIDECTOMY, PARTIAL     right side removed   Family History  Problem Relation Age of Onset  . Uterine cancer Unknown   . Pancreatic cancer Unknown   . Heart attack Father   . Diabetes Father   . Depression Mother   . Hypertension Unknown        parents   Social History   Socioeconomic History  . Marital status: Divorced    Spouse name: Not on file  . Number of children: Not on file  . Years of education: Not on file  . Highest  education level: Not on file  Occupational History  . Not on file  Tobacco Use  . Smoking status: Former Smoker    Quit date: 02/12/2014    Years since quitting: 5.2  . Smokeless tobacco: Never Used  Substance and Sexual Activity  . Alcohol use: No    Alcohol/week: 0.0 standard drinks  . Drug use: No  . Sexual activity: Not Currently  Other Topics Concern  . Not on file  Social History Narrative  . Not on file   Social Determinants of Health   Financial Resource Strain:   . Difficulty of Paying Living Expenses: Not on file  Food Insecurity:   . Worried About Charity fundraiser in the Last Year: Not on file  . Ran Out of Food in the Last Year: Not on file  Transportation Needs:   . Lack of Transportation (Medical): Not on file  . Lack of Transportation (Non-Medical): Not on file  Physical Activity:   . Days of Exercise per Week: Not on file  . Minutes of Exercise per Session: Not on file  Stress:   . Feeling of Stress : Not on file  Social Connections:   . Frequency of Communication with Friends and Family: Not on file  . Frequency of Social Gatherings with Friends and Family: Not on file  . Attends Religious Services: Not on file  . Active Member of Clubs or Organizations: Not on file  . Attends Archivist Meetings: Not on file  . Marital Status: Not on file    Outpatient Encounter Medications as of 05/13/2019  Medication Sig  . albuterol (PROVENTIL) (2.5 MG/3ML) 0.083% nebulizer solution Take 2.5 mg by nebulization every 6 (six) hours as needed for wheezing or shortness of breath.  [name] amoxicillin-clavulanate (AUGMENTIN) 875-125 MG tablet Take 1 tablet by mouth 2 (two) times daily. For 10 days.  [name] aspirin 81 MG tablet Take 81 mg by mouth daily.  [name] atorvastatin (LIPITOR) 20 MG tablet Take 1 tablet (20 mg total) by mouth daily.  [name] buPROPion (WELLBUTRIN XL) 150 MG 24 hr tablet Take 2 tablets (300 mg total) by mouth daily.  . busPIRone (BUSPAR) 7.5 MG tablet Take  1 tablet (7.5 mg total) by mouth 3 (three) times daily.  [name] donepezil (ARICEPT) 10 MG tablet TAKE 1 TABLET BY MOUTH EVERY DAY  . DULoxetine (CYMBALTA) 30 MG capsule TAKE 2 CAPSULES BY MOUTH EVERY DAY  . furosemide (LASIX) 20 MG tablet TAKE ONE TABLET BY MOUTH DAILY AS NEEDED FOR ANKLE SWELLING  . hydrOXYzine (ATARAX/VISTARIL) 25 MG tablet TAKE 1 TO 2 TABLETS BY MOUTH 3 TIMES A DAY  . Insulin Pen Needle (BD PEN NEEDLE NANO U/F) 32G X 4 MM MISC USE WITH VICTOZA ONCE DAILY  . levocetirizine (XYZAL) 5 MG tablet Take 1 tablet (5 mg total) by mouth every evening. NEEDS APPT  . meloxicam (MOBIC) 15 MG tablet Take 1 tablet (15 mg total) by mouth daily.  . montelukast (SINGULAIR) 10 MG tablet TAKE 1 TABLET BY MOUTH EVERYDAY AT BEDTIME  . OLANZapine (ZYPREXA) 5 MG tablet Take 1 tablet (5 mg total) by mouth at bedtime. NEEDS APPT  . ondansetron (ZOFRAN-ODT) 8 MG disintegrating tablet Take 1 tablet (8 mg total) by mouth every 8 (eight) hours as needed for nausea.  . Oxycodone HCl 10 MG TABS TAKE 1 TABELT BY MOUTH EVERY 6 HOURS FOR 30 DAYS  . OZEMPIC, 1 MG/DOSE, 2 MG/1.5ML SOPN INJECT 1 MG INTO THE SKIN ONCE A WEEK.  [name] rOPINIRole (REQUIP) 0.25 MG tablet TAKE 3 TABLETS BY MOUTH AS NEEDED NIGHTLY FOR RESTLESS LEGS  . SPIRIVA RESPIMAT 2.5 MCG/ACT AERS INHALE 2 PUFFS BY MOUTH INTO THE LUNGS DAILY  . topiramate (TOPAMAX) 50 MG tablet TAKE 1 TABLET BY MOUTH TWICE A DAY/ NEEDS APPT   No facility-administered encounter medications on file as of 05/13/2019.    Activities of Daily Living No flowsheet data found.  Patient Care Team: Lavada Mesi as PCP - General (Family Medicine)    Assessment:   This is a routine wellness examination for Ladana.Physical assessment deferred to PCP.   Exercise Activities and Dietary recommendations   Diet  Breakfast: Lunch:  Dinner:       Goals   None     Fall Risk Fall Risk  03/17/2019 11/25/2018 12/29/2016 12/23/2015  Falls in the past year? 0 (No Data) No No   Comment Emmi Telephone Survey: data to providers prior to load Emmi Telephone Survey: data to providers prior to load - Emmi Telephone Survey: data to providers prior to load  Number falls  in past yr: - (No Data) - -  Comment - Emmi Telephone Survey Actual Response =  - -  Risk for fall due to : - - Impaired balance/gait -   Is the patient's home free of loose throw rugs in walkways, pet beds, electrical cords, etc?   {Blank single:19197::"yes","no"}      Grab bars in the bathroom? {Blank single:19197::"yes","no"}      Handrails on the stairs?   {Blank single:19197::"yes","no"}      Adequate lighting?   {Blank single:19197::"yes","no"}   Depression Screen PHQ 2/9 Scores 08/09/2018 05/02/2017 09/26/2016 08/11/2016  PHQ - 2 Score 2 1 4 3   PHQ- 9 Score 5 5 10 19      Cognitive Function        Immunization History  Administered Date(s) Administered  . Influenza, High Dose Seasonal PF 12/13/2016, 12/17/2018  . Influenza-Unspecified 12/21/2014, 01/09/2016, 12/13/2016, 02/08/2018  . PPD Test 09/23/2014  . Pneumococcal Conjugate-13 01/12/2014  . Pneumococcal Polysaccharide-23 04/29/2003, 03/28/2016  . Tdap 10/04/2015  . Zoster 05/22/2012  . Zoster Recombinat (Shingrix) 11/21/2016    Screening Tests Health Maintenance  Topic Date Due  . URINE MICROALBUMIN  01/30/2018  . HEMOGLOBIN A1C  08/21/2018  . OPHTHALMOLOGY EXAM  01/15/2019  . FOOT EXAM  02/20/2019  . TETANUS/TDAP  10/03/2025  . COLONOSCOPY  02/13/2027  . INFLUENZA VACCINE  Completed  . DEXA SCAN  Completed  . Hepatitis C Screening  Completed  . PNA vac Low Risk Adult  Completed        Plan:   ***   I have personally reviewed and noted the following in the patient's chart:   . Medical and social history . Use of alcohol, tobacco or illicit drugs  . Current medications and supplements . Functional ability and status . Nutritional status . Physical activity . Advanced directives . List of other  physicians . Hospitalizations, surgeries, and ER visits in previous 12 months . Vitals . Screenings to include cognitive, depression, and falls . Referrals and appointments  In addition, I have reviewed and discussed with patient certain preventive protocols, quality metrics, and best practice recommendations. A written personalized care plan for preventive services as well as general preventive health recommendations were provided to patient.     Joanne Chars, LPN  [id number]

## 2019-05-27 ENCOUNTER — Other Ambulatory Visit: Payer: Self-pay | Admitting: Physician Assistant

## 2019-05-27 DIAGNOSIS — J42 Unspecified chronic bronchitis: Secondary | ICD-10-CM

## 2019-05-29 ENCOUNTER — Other Ambulatory Visit: Payer: Self-pay | Admitting: Physician Assistant

## 2019-05-29 DIAGNOSIS — E119 Type 2 diabetes mellitus without complications: Secondary | ICD-10-CM

## 2019-05-29 DIAGNOSIS — G2581 Restless legs syndrome: Secondary | ICD-10-CM

## 2019-06-03 ENCOUNTER — Telehealth: Payer: Self-pay | Admitting: Physician Assistant

## 2019-06-03 MED ORDER — UMECLIDINIUM BROMIDE 62.5 MCG/INH IN AEPB: 1.0000 | INHALATION_SPRAY | Freq: Every day | RESPIRATORY_TRACT | 5 refills | Status: DC

## 2019-06-03 NOTE — Telephone Encounter (Signed)
Insurance not paying for spiriva this year. Sent incruse.

## 2019-06-04 NOTE — Telephone Encounter (Signed)
Left message on machine for patient making her aware of change and to call with any questions.

## 2019-06-10 ENCOUNTER — Other Ambulatory Visit: Payer: Self-pay | Admitting: Physician Assistant

## 2019-06-10 DIAGNOSIS — F32A Depression, unspecified: Secondary | ICD-10-CM

## 2019-06-10 DIAGNOSIS — F329 Major depressive disorder, single episode, unspecified: Secondary | ICD-10-CM

## 2019-07-01 ENCOUNTER — Telehealth: Payer: Self-pay | Admitting: Neurology

## 2019-07-01 DIAGNOSIS — Z20828 Contact with and (suspected) exposure to other viral communicable diseases: Secondary | ICD-10-CM | POA: Diagnosis not present

## 2019-07-01 NOTE — Telephone Encounter (Signed)
Patient left vm asking where she can get Covid testing. Made her aware [facility] or other [facility]. She could have done here, but would need a virtual visit first to discuss symptoms. Also made her aware in [place] at [facility]. She is to call back with any additional questions.

## 2019-07-02 ENCOUNTER — Other Ambulatory Visit: Payer: Self-pay | Admitting: Physician Assistant

## 2019-07-02 DIAGNOSIS — F419 Anxiety disorder, unspecified: Secondary | ICD-10-CM

## 2019-07-02 DIAGNOSIS — F32A Depression, unspecified: Secondary | ICD-10-CM

## 2019-07-02 DIAGNOSIS — J42 Unspecified chronic bronchitis: Secondary | ICD-10-CM

## 2019-07-02 DIAGNOSIS — F329 Major depressive disorder, single episode, unspecified: Secondary | ICD-10-CM

## 2019-07-10 DIAGNOSIS — M5136 Other intervertebral disc degeneration, lumbar region: Secondary | ICD-10-CM | POA: Diagnosis not present

## 2019-07-10 DIAGNOSIS — M533 Sacrococcygeal disorders, not elsewhere classified: Secondary | ICD-10-CM | POA: Diagnosis not present

## 2019-07-10 DIAGNOSIS — M48061 Spinal stenosis, lumbar region without neurogenic claudication: Secondary | ICD-10-CM | POA: Diagnosis not present

## 2019-07-10 DIAGNOSIS — M47816 Spondylosis without myelopathy or radiculopathy, lumbar region: Secondary | ICD-10-CM | POA: Diagnosis not present

## 2019-07-11 DIAGNOSIS — I739 Peripheral vascular disease, unspecified: Secondary | ICD-10-CM | POA: Diagnosis not present

## 2019-07-11 DIAGNOSIS — L84 Corns and callosities: Secondary | ICD-10-CM | POA: Diagnosis not present

## 2019-07-11 DIAGNOSIS — E1151 Type 2 diabetes mellitus with diabetic peripheral angiopathy without gangrene: Secondary | ICD-10-CM | POA: Diagnosis not present

## 2019-07-11 DIAGNOSIS — L603 Nail dystrophy: Secondary | ICD-10-CM | POA: Diagnosis not present

## 2019-07-26 ENCOUNTER — Other Ambulatory Visit: Payer: Self-pay | Admitting: Physician Assistant

## 2019-07-26 DIAGNOSIS — F329 Major depressive disorder, single episode, unspecified: Secondary | ICD-10-CM

## 2019-07-26 DIAGNOSIS — F32A Depression, unspecified: Secondary | ICD-10-CM

## 2019-07-26 DIAGNOSIS — F419 Anxiety disorder, unspecified: Secondary | ICD-10-CM

## 2019-07-26 DIAGNOSIS — G894 Chronic pain syndrome: Secondary | ICD-10-CM

## 2019-07-26 DIAGNOSIS — J42 Unspecified chronic bronchitis: Secondary | ICD-10-CM

## 2019-07-28 ENCOUNTER — Other Ambulatory Visit: Payer: Self-pay

## 2019-07-28 DIAGNOSIS — J411 Mucopurulent chronic bronchitis: Secondary | ICD-10-CM

## 2019-07-28 MED ORDER — TOPIRAMATE 50 MG PO TABS: ORAL_TABLET | ORAL | 0 refills | Status: DC

## 2019-08-07 ENCOUNTER — Other Ambulatory Visit: Payer: Self-pay | Admitting: Physician Assistant

## 2019-08-07 DIAGNOSIS — G894 Chronic pain syndrome: Secondary | ICD-10-CM

## 2019-08-07 DIAGNOSIS — F419 Anxiety disorder, unspecified: Secondary | ICD-10-CM

## 2019-08-07 DIAGNOSIS — F329 Major depressive disorder, single episode, unspecified: Secondary | ICD-10-CM

## 2019-08-07 DIAGNOSIS — F32A Depression, unspecified: Secondary | ICD-10-CM

## 2019-08-07 DIAGNOSIS — J42 Unspecified chronic bronchitis: Secondary | ICD-10-CM

## 2019-08-11 ENCOUNTER — Other Ambulatory Visit: Payer: Self-pay | Admitting: Physician Assistant

## 2019-08-11 DIAGNOSIS — F32A Depression, unspecified: Secondary | ICD-10-CM

## 2019-08-11 DIAGNOSIS — F329 Major depressive disorder, single episode, unspecified: Secondary | ICD-10-CM

## 2019-08-11 DIAGNOSIS — G894 Chronic pain syndrome: Secondary | ICD-10-CM

## 2019-08-11 DIAGNOSIS — F419 Anxiety disorder, unspecified: Secondary | ICD-10-CM

## 2019-08-13 ENCOUNTER — Other Ambulatory Visit: Payer: Self-pay | Admitting: Physician Assistant

## 2019-08-13 DIAGNOSIS — F419 Anxiety disorder, unspecified: Secondary | ICD-10-CM

## 2019-08-13 DIAGNOSIS — F32A Depression, unspecified: Secondary | ICD-10-CM

## 2019-08-13 DIAGNOSIS — F329 Major depressive disorder, single episode, unspecified: Secondary | ICD-10-CM

## 2019-08-14 ENCOUNTER — Telehealth (INDEPENDENT_AMBULATORY_CARE_PROVIDER_SITE_OTHER): Payer: Medicare Other | Admitting: Family Medicine

## 2019-08-14 ENCOUNTER — Encounter: Payer: Self-pay | Admitting: Family Medicine

## 2019-08-14 DIAGNOSIS — J441 Chronic obstructive pulmonary disease with (acute) exacerbation: Secondary | ICD-10-CM | POA: Insufficient documentation

## 2019-08-14 MED ORDER — ALBUTEROL SULFATE HFA 108 (90 BASE) MCG/ACT IN AERS: 2.0000 | INHALATION_SPRAY | Freq: Four times a day (QID) | RESPIRATORY_TRACT | 2 refills | Status: DC | PRN

## 2019-08-14 MED ORDER — PREDNISONE 20 MG PO TABS: 20.0000 mg | ORAL_TABLET | Freq: Two times a day (BID) | ORAL | 0 refills | Status: AC

## 2019-08-14 MED ORDER — DOXYCYCLINE HYCLATE 100 MG PO TABS: 100.0000 mg | ORAL_TABLET | Freq: Two times a day (BID) | ORAL | 0 refills | Status: DC

## 2019-08-14 NOTE — Assessment & Plan Note (Signed)
COPD with exacerbation, likely has allergy component contributing to symptoms as well.  Start prednisone burst 20mg  BID x5 days, doxycycline. Albuterol MDI refilled.  Recommend addition of antihistamine such as cetirizine. COVID unlikely as she is vaccinated and afebrile.  Call if having new or worsening symptoms.

## 2019-08-14 NOTE — Progress Notes (Signed)
Sarah Wright - 74 y.o. female MRN [id number]  Date of birth: [birth year]   This visit type was conducted due to national recommendations for restrictions regarding the COVID-19 Pandemic (e.g. social distancing).  This format is felt to be most appropriate for this patient at this time.  All issues noted in this document were discussed and addressed.  No physical exam was performed (except for noted visual exam findings with Video Visits).  I discussed the limitations of evaluation and management by telemedicine and the availability of in person appointments. The patient expressed understanding and agreed to proceed.  I connected with@ on 08/14/19 at  1:00 PM EDT by a video enabled telemedicine application and verified that I am speaking with the correct person using two identifiers.  Present at visit: Sarah Nutting, DO Sarah Wright   Patient Location: Home [address] [place] [address]   Provider location:   [facility]  Chief Complaint  Patient presents with  . Nasal Congestion    x 2 wks, taking OTC meds.  . Cough    HPI  Beatryce Wright is a 74 y.o. female who presents via audio/video conferencing for a telehealth visit today.  She has complaint of increased congestion with cough, mild wheezing and shortness of breath.  She has history of COPD. Symptoms started about 2 weeks ago.  She has had intermittent headache but denies sinus pain/pressure.  Cough is productive for clear sputum.  She is using current inhalers as directed but is out of albuterol.  She denies fever, chills, body aches, nausea or vomiting.  She has had COVID vaccine.    ROS:  A comprehensive ROS was completed and negative except as noted per HPI  Past Medical History:  Diagnosis Date  . Diabetes mellitus without complication ([place])   . Hyperlipidemia   . Thyroid disease     Past Surgical History:  Procedure Laterality Date  . ABDOMINAL HYSTERECTOMY    . CARPAL TUNNEL RELEASE     both wrists  . CATARACT  EXTRACTION, BILATERAL    . CERVICAL FUSION    . GALLBLADDER SURGERY    . right foot surgery    . THYROIDECTOMY, PARTIAL     right side removed    Family History  Problem Relation Age of Onset  . Uterine cancer Unknown   . Pancreatic cancer Unknown   . Heart attack Father   . Diabetes Father   . Depression Mother   . Hypertension Unknown        parents    Social History   Socioeconomic History  . Marital status: Divorced    Spouse name: Not on file  . Number of children: Not on file  . Years of education: Not on file  . Highest education level: Not on file  Occupational History  . Not on file  Tobacco Use  . Smoking status: Former Smoker    Quit date: 02/12/2014    Years since quitting: 5.5  . Smokeless tobacco: Never Used  Substance and Sexual Activity  . Alcohol use: No    Alcohol/week: 0.0 standard drinks  . Drug use: No  . Sexual activity: Not Currently  Other Topics Concern  . Not on file  Social History Narrative  . Not on file   Social Determinants of Health   Financial Resource Strain:   . Difficulty of Paying Living Expenses:   Food Insecurity:   . Worried About Charity fundraiser in the Last Year:   .  Ran Out of Food in the Last Year:   Transportation Needs:   . Film/video editor (Medical):   [name] Lack of Transportation (Non-Medical):   Physical Activity:   . Days of Exercise per Week:   . Minutes of Exercise per Session:   Stress:   . Feeling of Stress :   Social Connections:   . Frequency of Communication with Friends and Family:   . Frequency of Social Gatherings with Friends and Family:   . Attends Religious Services:   . Active Member of Clubs or Organizations:   . Attends Archivist Meetings:   [name] Marital Status:   Intimate Partner Violence:   . Fear of Current or Ex-Partner:   . Emotionally Abused:   [name] Physically Abused:   . Sexually Abused:      Current Outpatient Medications:  .  albuterol (PROVENTIL) (2.5  MG/3ML) 0.083% nebulizer solution, Take 2.5 mg by nebulization every 6 (six) hours as needed for wheezing or shortness of breath., Disp: , Rfl:  .  amoxicillin-clavulanate (AUGMENTIN) 875-125 MG tablet, Take 1 tablet by mouth 2 (two) times daily. For 10 days., Disp: 20 tablet, Rfl: 0 .  aspirin 81 MG tablet, Take 81 mg by mouth daily., Disp: , Rfl:  .  atorvastatin (LIPITOR) 20 MG tablet, Take 1 tablet (20 mg total) by mouth daily., Disp: 90 tablet, Rfl: 3 .  buPROPion (WELLBUTRIN XL) 150 MG 24 hr tablet, Take 2 tablets (300 mg total) by mouth daily. APPT FOR FURTHER REFILLS, Disp: 180 tablet, Rfl: 0 .  busPIRone (BUSPAR) 7.5 MG tablet, Take 1 tablet (7.5 mg total) by mouth 3 (three) times daily., Disp: 90 tablet, Rfl: 5 .  donepezil (ARICEPT) 10 MG tablet, TAKE 1 TABLET BY MOUTH EVERY DAY, Disp: 90 tablet, Rfl: 1 .  furosemide (LASIX) 20 MG tablet, TAKE ONE TABLET BY MOUTH DAILY AS NEEDED FOR ANKLE SWELLING, Disp: 90 tablet, Rfl: 0 .  hydrOXYzine (ATARAX/VISTARIL) 25 MG tablet, TAKE 1 TO 2 TABLETS BY MOUTH 3 TIMES A DAY, Disp: 540 tablet, Rfl: 0 .  Insulin Pen Needle (BD PEN NEEDLE NANO U/F) 32G X 4 MM MISC, USE WITH VICTOZA ONCE DAILY, Disp: 100 each, Rfl: 2 .  levocetirizine (XYZAL) 5 MG tablet, TAKE 1 TABLET (5 MG TOTAL) BY MOUTH EVERY EVENING. NEEDS APPT, Disp: 7 tablet, Rfl: 0 .  meloxicam (MOBIC) 15 MG tablet, Take 1 tablet (15 mg total) by mouth daily., Disp: 90 tablet, Rfl: 3 .  montelukast (SINGULAIR) 10 MG tablet, TAKE 1 TABLET BY MOUTH EVERYDAY AT BEDTIME, Disp: 90 tablet, Rfl: 4 .  OLANZapine (ZYPREXA) 5 MG tablet, Take 1 tablet (5 mg total) by mouth at bedtime for 7 doses. **NEEDS APPT FOR REFILLS**FINAL REFILL, Disp: 7 tablet, Rfl: 0 .  ondansetron (ZOFRAN-ODT) 8 MG disintegrating tablet, Take 1 tablet (8 mg total) by mouth every 8 (eight) hours as needed for nausea., Disp: 20 tablet, Rfl: 1 .  Oxycodone HCl 10 MG TABS, TAKE 1 TABELT BY MOUTH EVERY 6 HOURS FOR 30 DAYS, Disp: , Rfl: 0 .   OZEMPIC, 1 MG/DOSE, 2 MG/1.5ML SOPN, INJECT 1 MG INTO THE SKIN ONCE A WEEK., Disp: 9 pen, Rfl: 0 .  rOPINIRole (REQUIP) 0.25 MG tablet, TAKE 3 TABLETS BY MOUTH AS NEEDED NIGHTLY FOR RESTLESS LEGS, Disp: 270 tablet, Rfl: 0 .  Tiotropium Bromide Monohydrate (SPIRIVA RESPIMAT) 2.5 MCG/ACT AERS, Inhale 2 puffs into the lungs daily. NEEDS APPT, Disp: 4 g, Rfl: 1 .  topiramate (TOPAMAX)  50 MG tablet, TAKE 1 TABLET BY MOUTH TWICE A DAY.Needs appt., Disp: 60 tablet, Rfl: 0 .  umeclidinium bromide (INCRUSE ELLIPTA) 62.5 MCG/INH AEPB, Inhale 1 puff into the lungs daily., Disp: 30 each, Rfl: 5 .  albuterol (VENTOLIN HFA) 108 (90 Base) MCG/ACT inhaler, Inhale 2 puffs into the lungs every 6 (six) hours as needed for wheezing or shortness of breath., Disp: 16 g, Rfl: 2 .  doxycycline (VIBRA-TABS) 100 MG tablet, Take 1 tablet (100 mg total) by mouth 2 (two) times daily., Disp: 20 tablet, Rfl: 0 .  DULoxetine (CYMBALTA) 30 MG capsule, Take 2 capsules (60 mg total) by mouth daily for 15 days. **NEEDS APPT FOR REFILLS**, Disp: 30 capsule, Rfl: 0 .  predniSONE (DELTASONE) 20 MG tablet, Take 1 tablet (20 mg total) by mouth 2 (two) times daily with a meal for 5 days., Disp: 10 tablet, Rfl: 0  EXAM:  VITALS per patient if applicable: Wt 231 lb (104.8 kg)   BMI 38.44 kg/m   GENERAL: alert, oriented, appears well and in no acute distress  HEENT: atraumatic, conjunttiva clear, no obvious abnormalities on inspection of external nose and ears  NECK: normal movements of the head and neck  LUNGS: on inspection no signs of respiratory distress, breathing rate appears normal, no obvious gross SOB, gasping or wheezing  CV: no obvious cyanosis  MS: moves all visible extremities without noticeable abnormality  PSYCH/NEURO: pleasant and cooperative, no obvious depression or anxiety, speech and thought processing grossly intact  ASSESSMENT AND PLAN:  Discussed the following assessment and plan:  COPD exacerbation  (HCC) COPD with exacerbation, likely has allergy component contributing to symptoms as well.  Start prednisone burst 20mg  BID x5 days, doxycycline. Albuterol MDI refilled.  Recommend addition of antihistamine such as cetirizine. COVID unlikely as she is vaccinated and afebrile.  Call if having new or worsening symptoms.   30 minutes spent including pre visit preparation, review of prior notes and labs, encounter with patient via video visit and same day documentation.    I discussed the assessment and treatment plan with the patient. The patient was provided an opportunity to ask questions and all were answered. The patient agreed with the plan and demonstrated an understanding of the instructions.   The patient was advised to call back or seek an in-person evaluation if the symptoms worsen or if the condition fails to improve as anticipated.    Sarah Nutting, DO

## 2019-08-15 ENCOUNTER — Other Ambulatory Visit: Payer: Self-pay | Admitting: Physician Assistant

## 2019-08-15 DIAGNOSIS — E119 Type 2 diabetes mellitus without complications: Secondary | ICD-10-CM

## 2019-08-18 ENCOUNTER — Other Ambulatory Visit: Payer: Self-pay | Admitting: Physician Assistant

## 2019-08-18 DIAGNOSIS — F32A Depression, unspecified: Secondary | ICD-10-CM

## 2019-08-18 DIAGNOSIS — F329 Major depressive disorder, single episode, unspecified: Secondary | ICD-10-CM

## 2019-08-18 DIAGNOSIS — F419 Anxiety disorder, unspecified: Secondary | ICD-10-CM

## 2019-08-18 DIAGNOSIS — G894 Chronic pain syndrome: Secondary | ICD-10-CM

## 2019-08-21 ENCOUNTER — Other Ambulatory Visit: Payer: Self-pay | Admitting: Physician Assistant

## 2019-08-21 DIAGNOSIS — G2581 Restless legs syndrome: Secondary | ICD-10-CM

## 2019-08-22 ENCOUNTER — Other Ambulatory Visit: Payer: Self-pay | Admitting: Physician Assistant

## 2019-09-04 DIAGNOSIS — Z79899 Other long term (current) drug therapy: Secondary | ICD-10-CM | POA: Diagnosis not present

## 2019-09-04 DIAGNOSIS — M47816 Spondylosis without myelopathy or radiculopathy, lumbar region: Secondary | ICD-10-CM | POA: Diagnosis not present

## 2019-09-04 DIAGNOSIS — M5136 Other intervertebral disc degeneration, lumbar region: Secondary | ICD-10-CM | POA: Diagnosis not present

## 2019-09-04 DIAGNOSIS — M48061 Spinal stenosis, lumbar region without neurogenic claudication: Secondary | ICD-10-CM | POA: Diagnosis not present

## 2019-09-04 DIAGNOSIS — M533 Sacrococcygeal disorders, not elsewhere classified: Secondary | ICD-10-CM | POA: Diagnosis not present

## 2019-09-04 DIAGNOSIS — Z5181 Encounter for therapeutic drug level monitoring: Secondary | ICD-10-CM | POA: Diagnosis not present

## 2019-09-05 ENCOUNTER — Other Ambulatory Visit: Payer: Self-pay | Admitting: Physician Assistant

## 2019-09-05 DIAGNOSIS — F32A Depression, unspecified: Secondary | ICD-10-CM

## 2019-09-05 DIAGNOSIS — F419 Anxiety disorder, unspecified: Secondary | ICD-10-CM

## 2019-09-06 ENCOUNTER — Other Ambulatory Visit: Payer: Self-pay | Admitting: Physician Assistant

## 2019-09-06 DIAGNOSIS — E7849 Other hyperlipidemia: Secondary | ICD-10-CM

## 2019-09-08 ENCOUNTER — Telehealth (INDEPENDENT_AMBULATORY_CARE_PROVIDER_SITE_OTHER): Payer: Medicare Other | Admitting: Physician Assistant

## 2019-09-08 ENCOUNTER — Encounter: Payer: Self-pay | Admitting: Physician Assistant

## 2019-09-08 VITALS — Temp 98.1°F | Ht 65.0 in | Wt 231.0 lb

## 2019-09-08 DIAGNOSIS — R110 Nausea: Secondary | ICD-10-CM

## 2019-09-08 DIAGNOSIS — J441 Chronic obstructive pulmonary disease with (acute) exacerbation: Secondary | ICD-10-CM

## 2019-09-08 DIAGNOSIS — J40 Bronchitis, not specified as acute or chronic: Secondary | ICD-10-CM | POA: Diagnosis not present

## 2019-09-08 DIAGNOSIS — J329 Chronic sinusitis, unspecified: Secondary | ICD-10-CM | POA: Diagnosis not present

## 2019-09-08 MED ORDER — ONDANSETRON HCL 8 MG PO TABS: 8.0000 mg | ORAL_TABLET | Freq: Three times a day (TID) | ORAL | 0 refills | Status: DC | PRN

## 2019-09-08 MED ORDER — PREDNISONE 50 MG PO TABS: ORAL_TABLET | ORAL | 0 refills | Status: DC

## 2019-09-08 MED ORDER — AZITHROMYCIN 250 MG PO TABS: ORAL_TABLET | ORAL | 0 refills | Status: DC

## 2019-09-08 NOTE — Progress Notes (Signed)
REQUESTING DOXY Started 4 days ago: Sinus headaches Congestion Feels like its traveling to chest  Has tried allergy medication - Zyrtec - not helping

## 2019-09-09 ENCOUNTER — Encounter: Payer: Self-pay | Admitting: Physician Assistant

## 2019-09-09 NOTE — Progress Notes (Signed)
Patient ID: [name], female   DOB: 09/07/1945, 74 y.o.   MRN: [id number] .[name] KitchenVirtual Visit via Telephone Note  I connected with [name] on 09/08/2019 at  3:20 PM EDT by telephone and verified that I am speaking with the correct person using two identifiers.  Location: Patient: home Provider: clinic   I discussed the limitations, risks, security and privacy concerns of performing an evaluation and management service by telephone and the availability of in person appointments. I also discussed with the patient that there may be a patient responsible charge related to this service. The patient expressed understanding and agreed to proceed.   History of Present Illness: Pt is a 74 yo female with COPD, T2DM who calls into the clinic with almost a week of sinus congestion, cough, headache, ear pain that is now going into her chest. She has a productive barking cough. She is more SOB. No wheezing. She is using spiriva daily and provential multiple times a day as needed for SOB and wheezing. She is coughing so much she is getting nauseated. She did fully recover from last exacerbation but then her granddaughter came home with cold and she caught it. No loss of smell or taste, no Gi symptoms. She started zyrtec after last appt but does not seem to help her current symptoms.   .. Active Ambulatory Problems    Diagnosis Date Noted  . Anxiety and depression 03/16/2014  . Hyperlipidemia 03/16/2014  . COLD (chronic obstructive lung disease) ([place]) 03/16/2014  . Insomnia 03/16/2014  . Esophageal reflux 03/16/2014  . Bilateral hand pain 03/16/2014  . DDD (degenerative disc disease), lumbar 03/16/2014  . Lumbar spondylosis 03/17/2014  . Aortic atherosclerosis ([place]) 03/17/2014  . Chronic venous insufficiency 03/27/2014  . H/O colonoscopy 04/02/2014  . Thyroid disease 04/02/2014  . Cataract 04/02/2014  . Type 2 diabetes mellitus ([place]) 04/02/2014  . Primary osteoarthritis of both knees 07/21/2014   . Morbid obesity ([place]) 10/07/2014  . Dizziness 01/04/2015  . Sensorineural hearing loss of both ears 01/26/2015  . Thyroid nodule 06/22/2015  . Memory loss 07/29/2015  . Psoriasis 09/23/2015  . Right shoulder injury 01/27/2016  . Balance problem 08/13/2016  . Left shoulder pain 08/13/2016  . Numbness around mouth 09/08/2016  . DDD (degenerative disc disease), cervical 12/29/2016  . Microalbuminuria 01/30/2017  . Chronic pain syndrome 05/06/2017  . Skin abrasion 08/01/2017  . Leukocytes in urine 08/01/2017  . Dysuria 08/01/2017  . Panic attack 08/01/2017  . RLS (restless legs syndrome) 08/12/2018  . COPD exacerbation ([place]) 08/14/2019   Resolved Ambulatory Problems    Diagnosis Date Noted  . No Resolved Ambulatory Problems   Past Medical History:  Diagnosis Date  . Diabetes mellitus without complication ([place])        Observations/Objective: No acute distress Barking productive cough heard over phone.  No wheezing.  No labored breathing.   .. Today's Vitals   09/08/19 1425  Temp: 98.1 F (36.7 C)  TempSrc: Oral  Weight: 231 lb (104.8 kg)  Height: 5\' 5"  (1.651 m)   Body mass index is 38.44 kg/m.   Assessment and Plan: [name] KitchenMarland KitchenLuanne was seen today for sinus problem.  Diagnoses and all orders for this visit:  Sinobronchitis -     predniSONE (DELTASONE) 50 MG tablet; Take one tablet for 5 days. -     azithromycin (ZITHROMAX) 250 MG tablet; Take 2 tablets now and then one tablet for 4 days.  COPD exacerbation (HCC) -     predniSONE (DELTASONE) 50  MG tablet; Take one tablet for 5 days. -     azithromycin (ZITHROMAX) 250 MG tablet; Take 2 tablets now and then one tablet for 4 days.  Nausea -     ondansetron (ZOFRAN) 8 MG tablet; Take 1 tablet (8 mg total) by mouth every 8 (eight) hours as needed for nausea or vomiting.   Discussed with patient concern for exacerbations happening so frequently. Will treat for COPD exacerbation with zpak and prednisone with  provential as needed.  Continue zyrtec. zofran as needed for nausea. Continue spiriva but seems like may need to switch up medications if exacerbations are getting closer together. If continues to be symptomatic need to come in to listen to lungs, consider xray do more evaluation and look at medication changes. Pt agrees.    Follow Up Instructions:    I discussed the assessment and treatment plan with the patient. The patient was provided an opportunity to ask questions and all were answered. The patient agreed with the plan and demonstrated an understanding of the instructions.   The patient was advised to call back or seek an in-person evaluation if the symptoms worsen or if the condition fails to improve as anticipated.  I provided 15 minutes of non-face-to-face time during this encounter.   [name], PA-C

## 2019-09-11 ENCOUNTER — Other Ambulatory Visit: Payer: Self-pay | Admitting: Physician Assistant

## 2019-09-12 ENCOUNTER — Other Ambulatory Visit: Payer: Self-pay | Admitting: Physician Assistant

## 2019-09-12 DIAGNOSIS — G2581 Restless legs syndrome: Secondary | ICD-10-CM

## 2019-09-17 ENCOUNTER — Other Ambulatory Visit: Payer: Self-pay | Admitting: Physician Assistant

## 2019-09-17 DIAGNOSIS — M25512 Pain in left shoulder: Secondary | ICD-10-CM

## 2019-09-23 ENCOUNTER — Other Ambulatory Visit: Payer: Self-pay | Admitting: Physician Assistant

## 2019-09-23 DIAGNOSIS — G2581 Restless legs syndrome: Secondary | ICD-10-CM

## 2019-10-01 ENCOUNTER — Other Ambulatory Visit: Payer: Self-pay | Admitting: Physician Assistant

## 2019-10-01 DIAGNOSIS — R413 Other amnesia: Secondary | ICD-10-CM

## 2019-10-02 ENCOUNTER — Other Ambulatory Visit: Payer: Self-pay | Admitting: Physician Assistant

## 2019-10-02 DIAGNOSIS — F32A Depression, unspecified: Secondary | ICD-10-CM

## 2019-10-02 DIAGNOSIS — E7849 Other hyperlipidemia: Secondary | ICD-10-CM

## 2019-10-06 ENCOUNTER — Other Ambulatory Visit: Payer: Self-pay | Admitting: Physician Assistant

## 2019-10-06 DIAGNOSIS — F32A Depression, unspecified: Secondary | ICD-10-CM

## 2019-10-06 DIAGNOSIS — F419 Anxiety disorder, unspecified: Secondary | ICD-10-CM

## 2019-10-06 DIAGNOSIS — G894 Chronic pain syndrome: Secondary | ICD-10-CM

## 2019-10-08 ENCOUNTER — Other Ambulatory Visit: Payer: Self-pay | Admitting: Neurology

## 2019-10-08 DIAGNOSIS — E119 Type 2 diabetes mellitus without complications: Secondary | ICD-10-CM

## 2019-10-08 MED ORDER — OZEMPIC (1 MG/DOSE) 2 MG/1.5ML ~~LOC~~ SOPN: 1.0000 mg | PEN_INJECTOR | SUBCUTANEOUS | 0 refills | Status: DC

## 2019-10-10 ENCOUNTER — Other Ambulatory Visit: Payer: Self-pay | Admitting: Physician Assistant

## 2019-10-10 DIAGNOSIS — J42 Unspecified chronic bronchitis: Secondary | ICD-10-CM

## 2019-10-12 ENCOUNTER — Other Ambulatory Visit: Payer: Self-pay | Admitting: Physician Assistant

## 2019-10-12 DIAGNOSIS — E7849 Other hyperlipidemia: Secondary | ICD-10-CM

## 2019-10-15 ENCOUNTER — Other Ambulatory Visit: Payer: Self-pay | Admitting: Physician Assistant

## 2019-10-15 DIAGNOSIS — F419 Anxiety disorder, unspecified: Secondary | ICD-10-CM

## 2019-10-15 DIAGNOSIS — F32A Depression, unspecified: Secondary | ICD-10-CM

## 2019-10-19 ENCOUNTER — Other Ambulatory Visit: Payer: Self-pay | Admitting: Physician Assistant

## 2019-10-19 DIAGNOSIS — M25512 Pain in left shoulder: Secondary | ICD-10-CM

## 2019-10-23 ENCOUNTER — Other Ambulatory Visit: Payer: Self-pay | Admitting: Family Medicine

## 2019-10-23 ENCOUNTER — Telehealth: Payer: Self-pay | Admitting: Neurology

## 2019-10-23 DIAGNOSIS — G894 Chronic pain syndrome: Secondary | ICD-10-CM

## 2019-10-23 DIAGNOSIS — F419 Anxiety disorder, unspecified: Secondary | ICD-10-CM

## 2019-10-23 DIAGNOSIS — F32A Depression, unspecified: Secondary | ICD-10-CM

## 2019-10-23 MED ORDER — OLANZAPINE 5 MG PO TABS: 5.0000 mg | ORAL_TABLET | Freq: Every day | ORAL | 0 refills | Status: DC

## 2019-10-23 MED ORDER — TOPIRAMATE 50 MG PO TABS: 50.0000 mg | ORAL_TABLET | Freq: Two times a day (BID) | ORAL | 0 refills | Status: DC

## 2019-10-23 MED ORDER — DULOXETINE HCL 30 MG PO CPEP: 60.0000 mg | ORAL_CAPSULE | Freq: Every day | ORAL | 0 refills | Status: DC

## 2019-10-23 NOTE — Telephone Encounter (Signed)
Duloxetine, Olanzapine, and Topamax [facility]. Will keep appt Tuesday.

## 2019-10-23 NOTE — Telephone Encounter (Signed)
Patient left vm stating she has "been out of medications for a couple weeks and feeling bad" She has appt next Tuesday but asking for refills until that time. Did not leave the names of medications. Will call patient back to inquire.

## 2019-10-28 ENCOUNTER — Ambulatory Visit (INDEPENDENT_AMBULATORY_CARE_PROVIDER_SITE_OTHER): Payer: Medicare Other | Admitting: Physician Assistant

## 2019-10-28 ENCOUNTER — Other Ambulatory Visit: Payer: Self-pay | Admitting: Physician Assistant

## 2019-10-28 ENCOUNTER — Encounter: Payer: Self-pay | Admitting: Physician Assistant

## 2019-10-28 VITALS — BP 114/43 | HR 85 | Ht 65.0 in | Wt 230.0 lb

## 2019-10-28 DIAGNOSIS — F419 Anxiety disorder, unspecified: Secondary | ICD-10-CM

## 2019-10-28 DIAGNOSIS — Z79899 Other long term (current) drug therapy: Secondary | ICD-10-CM

## 2019-10-28 DIAGNOSIS — E119 Type 2 diabetes mellitus without complications: Secondary | ICD-10-CM

## 2019-10-28 DIAGNOSIS — F329 Major depressive disorder, single episode, unspecified: Secondary | ICD-10-CM

## 2019-10-28 DIAGNOSIS — R413 Other amnesia: Secondary | ICD-10-CM

## 2019-10-28 DIAGNOSIS — E7849 Other hyperlipidemia: Secondary | ICD-10-CM

## 2019-10-28 DIAGNOSIS — G2581 Restless legs syndrome: Secondary | ICD-10-CM

## 2019-10-28 DIAGNOSIS — I872 Venous insufficiency (chronic) (peripheral): Secondary | ICD-10-CM

## 2019-10-28 DIAGNOSIS — J42 Unspecified chronic bronchitis: Secondary | ICD-10-CM

## 2019-10-28 DIAGNOSIS — F32A Depression, unspecified: Secondary | ICD-10-CM

## 2019-10-28 DIAGNOSIS — I495 Sick sinus syndrome: Secondary | ICD-10-CM

## 2019-10-28 DIAGNOSIS — R110 Nausea: Secondary | ICD-10-CM

## 2019-10-28 DIAGNOSIS — R0602 Shortness of breath: Secondary | ICD-10-CM

## 2019-10-28 DIAGNOSIS — G894 Chronic pain syndrome: Secondary | ICD-10-CM

## 2019-10-28 DIAGNOSIS — F410 Panic disorder [episodic paroxysmal anxiety] without agoraphobia: Secondary | ICD-10-CM

## 2019-10-28 DIAGNOSIS — M25512 Pain in left shoulder: Secondary | ICD-10-CM

## 2019-10-28 LAB — POCT GLYCOSYLATED HEMOGLOBIN (HGB A1C): Hemoglobin A1C: 5.5 % (ref 4.0–5.6)

## 2019-10-28 LAB — POCT UA - MICROALBUMIN
Creatinine, POC: 300 mg/dL
Microalbumin Ur, POC: 80 mg/L

## 2019-10-28 MED ORDER — BUPROPION HCL ER (XL) 150 MG PO TB24: 300.0000 mg | ORAL_TABLET | Freq: Every day | ORAL | 3 refills | Status: DC

## 2019-10-28 MED ORDER — ROPINIROLE HCL 0.25 MG PO TABS: ORAL_TABLET | ORAL | 3 refills | Status: DC

## 2019-10-28 MED ORDER — DULOXETINE HCL 30 MG PO CPEP: 60.0000 mg | ORAL_CAPSULE | Freq: Two times a day (BID) | ORAL | 3 refills | Status: DC

## 2019-10-28 MED ORDER — ONDANSETRON HCL 8 MG PO TABS: 8.0000 mg | ORAL_TABLET | Freq: Three times a day (TID) | ORAL | 2 refills | Status: DC | PRN

## 2019-10-28 MED ORDER — MELOXICAM 15 MG PO TABS: 15.0000 mg | ORAL_TABLET | Freq: Every day | ORAL | 3 refills | Status: DC

## 2019-10-28 MED ORDER — OZEMPIC (1 MG/DOSE) 2 MG/1.5ML ~~LOC~~ SOPN: 1.0000 mg | PEN_INJECTOR | SUBCUTANEOUS | 0 refills | Status: DC

## 2019-10-28 MED ORDER — ATORVASTATIN CALCIUM 20 MG PO TABS: 20.0000 mg | ORAL_TABLET | Freq: Every day | ORAL | 3 refills | Status: DC

## 2019-10-28 MED ORDER — HYDROXYZINE HCL 25 MG PO TABS: ORAL_TABLET | ORAL | 1 refills | Status: DC

## 2019-10-28 MED ORDER — OLANZAPINE 5 MG PO TABS: 5.0000 mg | ORAL_TABLET | Freq: Every day | ORAL | 3 refills | Status: DC

## 2019-10-28 MED ORDER — FUROSEMIDE 20 MG PO TABS: ORAL_TABLET | ORAL | 0 refills | Status: DC

## 2019-10-28 MED ORDER — DONEPEZIL HCL 10 MG PO TABS: ORAL_TABLET | ORAL | 3 refills | Status: DC

## 2019-10-28 MED ORDER — TOPIRAMATE 50 MG PO TABS: 50.0000 mg | ORAL_TABLET | Freq: Two times a day (BID) | ORAL | 3 refills | Status: DC

## 2019-10-28 MED ORDER — TRELEGY ELLIPTA 100-62.5-25 MCG/INH IN AEPB: 1.0000 | INHALATION_SPRAY | Freq: Every day | RESPIRATORY_TRACT | 3 refills | Status: DC

## 2019-10-28 NOTE — Progress Notes (Signed)
Subjective:    Patient ID: [name], female    DOB: January 01, 1946, 74 y.o.   MRN: [id number]  HPI  Patient is a 74 year old obese female with type 2 diabetes, COPD, chronic venous insufficiency, aortic atherosclerosis, restless leg, insomnia, hyperlipidemia who presents to the clinic for 15-month follow-up.  Patient admits she is not checking her sugars.  She is taking her Ozempic weekly.  She is tolerating this very well.  She is trying to make healthy food choices however she feels like she does not always.  She is not very active.  She denies any open sores or wounds.  She denies any hypoglycemic events.  Patient does have OSA but she is not using her machine because she does not like the way she feels with it on at night.  She does admit to having some worsening shortness of breath.  She does admit to having some problems laying flat at night.  She does use trilogy at night and albuterol as needed.   .. Active Ambulatory Problems    Diagnosis Date Noted  . Anxiety and depression 03/16/2014  . Hyperlipidemia 03/16/2014  . COLD (chronic obstructive lung disease) ([place]) 03/16/2014  . Insomnia 03/16/2014  . Esophageal reflux 03/16/2014  . Bilateral hand pain 03/16/2014  . DDD (degenerative disc disease), lumbar 03/16/2014  . Lumbar spondylosis 03/17/2014  . Aortic atherosclerosis ([place]) 03/17/2014  . Chronic venous insufficiency 03/27/2014  . H/O colonoscopy 04/02/2014  . Thyroid disease 04/02/2014  . Cataract 04/02/2014  . Type 2 diabetes mellitus ([place]) 04/02/2014  . Primary osteoarthritis of both knees 07/21/2014  . Morbid obesity ([place]) 10/07/2014  . Dizziness 01/04/2015  . Sensorineural hearing loss of both ears 01/26/2015  . Thyroid nodule 06/22/2015  . Memory loss 07/29/2015  . Psoriasis 09/23/2015  . Right shoulder injury 01/27/2016  . Balance problem 08/13/2016  . Left shoulder pain 08/13/2016  . Numbness around mouth 09/08/2016  . DDD (degenerative disc disease),  cervical 12/29/2016  . Microalbuminuria 01/30/2017  . Chronic pain syndrome 05/06/2017  . Skin abrasion 08/01/2017  . Leukocytes in urine 08/01/2017  . Dysuria 08/01/2017  . Panic attack 08/01/2017  . RLS (restless legs syndrome) 08/12/2018  . COPD exacerbation ([place]) 08/14/2019   Resolved Ambulatory Problems    Diagnosis Date Noted  . No Resolved Ambulatory Problems   Past Medical History:  Diagnosis Date  . Diabetes mellitus without complication ([place])      Review of Systems  All other systems reviewed and are negative.      Objective:   Physical Exam Vitals reviewed.  Constitutional:      Appearance: Normal appearance. She is obese.  HENT:     Head: Normocephalic.  Cardiovascular:     Rate and Rhythm: Normal rate and regular rhythm.     Pulses: Normal pulses.     Heart sounds: Murmur heard.   Pulmonary:     Effort: Pulmonary effort is normal.     Breath sounds: Normal breath sounds.  Musculoskeletal:     Right lower leg: No edema.     Left lower leg: No edema.  Neurological:     General: No focal deficit present.     Mental Status: She is alert and oriented to person, place, and time.  Psychiatric:        Mood and Affect: Mood normal.      .. Depression screen [facility] 2/9 10/28/2019 08/09/2018 05/02/2017 09/26/2016 08/11/2016  Decreased Interest 1 1 0 2 1  Down, Depressed, Hopeless  0 1 1 2 2   PHQ - 2 Score 1 2 1 4 3   Altered sleeping 1 1 1 1 2   Tired, decreased energy 1 1 1 1 3   Change in appetite 1 0 0 1 3  Feeling bad or failure about yourself  1 0 2 1 3   Trouble concentrating 0 0 0 1 2  Moving slowly or fidgety/restless 0 1 0 0 2  Suicidal thoughts 0 0 0 1 1  PHQ-9 Score 5 5 5 10 19   Difficult doing work/chores Not difficult at all Not difficult at all Not difficult at all - -   .[name] GAD 7 : Generalized Anxiety Score 10/28/2019 08/09/2018  Nervous, Anxious, on Edge 0 1  Control/stop worrying 0 1  Worry too much - different things 1 1  Trouble relaxing 0 1   Restless 0 1  Easily annoyed or irritable 1 1  Afraid - awful might happen 0 0  Total GAD 7 Score 2 6  Anxiety Difficulty Not difficult at all Not difficult at all         Assessment & Plan:  [name] KitchenMarland KitchenLuanne was seen today for diabetes and follow-up.  Diagnoses and all orders for this visit:  Type 2 diabetes mellitus without complication, without long-term current use of insulin (HCC) -     POCT glycosylated hemoglobin (Hb A1C) -     POCT UA - Microalbumin -     Semaglutide, 1 MG/DOSE, (OZEMPIC, 1 MG/DOSE,) 2 MG/1.5ML SOPN; Inject 0.75 mLs (1 mg total) into the skin once a week. -     Lipid Panel w/reflex Direct LDL -     COMPLETE METABOLIC PANEL WITH GFR  Morbid obesity (HCC) -     topiramate (TOPAMAX) 50 MG tablet; Take 1 tablet (50 mg total) by mouth 2 (two) times daily.  Chronic bronchitis, unspecified chronic bronchitis type ([place]) -     Fluticasone-Umeclidin-Vilant (TRELEGY ELLIPTA) 100-62.5-25 MCG/INH AEPB; Inhale 1 puff into the lungs daily.  RLS (restless legs syndrome) -     rOPINIRole (REQUIP) 0.25 MG tablet; TAKE 3 TABLETS BY MOUTH AS NEEDED NIGHTLY FOR RESTLESS LEGS  Anxiety and depression -     Discontinue: buPROPion (WELLBUTRIN XL) 150 MG 24 hr tablet; Take 2 tablets (300 mg total) by mouth daily. -     OLANZapine (ZYPREXA) 5 MG tablet; Take 1 tablet (5 mg total) by mouth at bedtime for 7 doses. -     DULoxetine (CYMBALTA) 30 MG capsule; Take 2 capsules (60 mg total) by mouth 2 (two) times daily.  Chronic pain syndrome -     DULoxetine (CYMBALTA) 30 MG capsule; Take 2 capsules (60 mg total) by mouth 2 (two) times daily. -     COMPLETE METABOLIC PANEL WITH GFR  Chronic venous insufficiency -     furosemide (LASIX) 20 MG tablet; Take as needed for lower extremity swelling. -     COMPLETE METABOLIC PANEL WITH GFR -     ECHOCARDIOGRAM COMPLETE  Left shoulder pain, unspecified chronicity -     meloxicam (MOBIC) 15 MG tablet; Take 1 tablet (15 mg total) by mouth  daily. -     COMPLETE METABOLIC PANEL WITH GFR  Memory loss -     donepezil (ARICEPT) 10 MG tablet; TAKE 1 TABLET BY MOUTH EVERY DAY.  Other hyperlipidemia -     atorvastatin (LIPITOR) 20 MG tablet; Take 1 tablet (20 mg total) by mouth daily. -     Lipid Panel w/reflex Direct LDL  Nausea -     ondansetron (ZOFRAN) 8 MG tablet; Take 1 tablet (8 mg total) by mouth every 8 (eight) hours as needed for nausea or vomiting.  Panic attack -     hydrOXYzine (ATARAX/VISTARIL) 25 MG tablet; Take one tablet as needed up to twice a day.  Medication management -     Lipid Panel w/reflex Direct LDL -     COMPLETE METABOLIC PANEL WITH GFR  Sinoatrial node dysfunction (HCC)  SOB (shortness of breath) on exertion -     ECHOCARDIOGRAM COMPLETE   .[name] Results for orders placed or performed in visit on 10/28/19  Lipid Panel w/reflex Direct LDL  Result Value Ref Range   Cholesterol 113 <200 mg/dL   HDL 39 (L) > OR = 50 mg/dL   Triglycerides 117 <150 mg/dL   LDL Cholesterol (Calc) 54 mg/dL (calc)   Total CHOL/HDL Ratio 2.9 <5.0 (calc)   Non-HDL Cholesterol (Calc) 74 <130 mg/dL (calc)  COMPLETE METABOLIC PANEL WITH GFR  Result Value Ref Range   Glucose, Bld 91 65 - 99 mg/dL   BUN 14 7 - 25 mg/dL   Creat 0.94 (H) 0.60 - 0.93 mg/dL   GFR, Est Non African American 59 (L) > OR = 60 mL/min/1.73m2   GFR, Est African American 69 > OR = 60 mL/min/1.52m2   BUN/Creatinine Ratio 15 6 - 22 (calc)   Sodium 142 135 - 146 mmol/L   Potassium 4.4 3.5 - 5.3 mmol/L   Chloride 107 98 - 110 mmol/L   CO2 27 20 - 32 mmol/L   Calcium 9.1 8.6 - 10.4 mg/dL   Total Protein 6.1 6.1 - 8.1 g/dL   Albumin 3.9 3.6 - 5.1 g/dL   Globulin 2.2 1.9 - 3.7 g/dL (calc)   AG Ratio 1.8 1.0 - 2.5 (calc)   Total Bilirubin 0.4 0.2 - 1.2 mg/dL   Alkaline phosphatase (APISO) 116 37 - 153 U/L   AST 18 10 - 35 U/L   ALT 17 6 - 29 U/L  POCT glycosylated hemoglobin (Hb A1C)  Result Value Ref Range   Hemoglobin A1C 5.5 4.0 - 5.6 %    HbA1c POC (<> result, manual entry)     HbA1c, POC (prediabetic range)     HbA1c, POC (controlled diabetic range)    POCT UA - Microalbumin  Result Value Ref Range   Microalbumin Ur, POC 80 mg/L   Creatinine, POC 300 mg/dL   Albumin/Creatinine Ratio, Urine, POC 30-300    Labs ordered.   A1C to goal.  Continue same medication regimen Not on ACE blood pressure to goal There is evidence of microalbuminemia. Kidney function looked great.  We could consider adding a GL 2-4 chronic kidney disease prevention.  I do not think her blood pressure could handle an ACE inhibitor. On statin .[name] Diabetic Foot Exam - Simple   Simple Foot Form Diabetic Foot exam was performed with the following findings: Yes 10/28/2019  1:17 PM  Visual Inspection No deformities, no ulcerations, no other skin breakdown bilaterally: Yes Sensation Testing Intact to touch and monofilament testing bilaterally: Yes See comments: Yes Pulse Check Posterior Tibialis and Dorsalis pulse intact bilaterally: Yes Comments No feeling with monofilament testing left heel    Vaccine up to date.  Needs eye exam.    SOB worsening could be COPD/heat/CHF. Ordered echo.

## 2019-10-29 ENCOUNTER — Encounter: Payer: Self-pay | Admitting: Physician Assistant

## 2019-10-29 ENCOUNTER — Telehealth: Payer: Self-pay | Admitting: Physician Assistant

## 2019-10-29 NOTE — Telephone Encounter (Signed)
Received fax for PA on Hydroxyzine sent through cover my meds and waiting on determination. - CF

## 2019-10-29 NOTE — Telephone Encounter (Signed)
Did you order Echo? I didn't see it.

## 2019-10-31 ENCOUNTER — Other Ambulatory Visit: Payer: Self-pay | Admitting: Physician Assistant

## 2019-10-31 DIAGNOSIS — E119 Type 2 diabetes mellitus without complications: Secondary | ICD-10-CM

## 2019-11-01 ENCOUNTER — Other Ambulatory Visit: Payer: Self-pay | Admitting: Physician Assistant

## 2019-11-01 DIAGNOSIS — J42 Unspecified chronic bronchitis: Secondary | ICD-10-CM

## 2019-11-03 ENCOUNTER — Encounter: Payer: Self-pay | Admitting: Physician Assistant

## 2019-11-03 DIAGNOSIS — M25512 Pain in left shoulder: Secondary | ICD-10-CM | POA: Diagnosis not present

## 2019-11-03 DIAGNOSIS — Z79899 Other long term (current) drug therapy: Secondary | ICD-10-CM | POA: Diagnosis not present

## 2019-11-03 DIAGNOSIS — E119 Type 2 diabetes mellitus without complications: Secondary | ICD-10-CM | POA: Diagnosis not present

## 2019-11-03 DIAGNOSIS — E7849 Other hyperlipidemia: Secondary | ICD-10-CM | POA: Diagnosis not present

## 2019-11-03 DIAGNOSIS — G894 Chronic pain syndrome: Secondary | ICD-10-CM | POA: Diagnosis not present

## 2019-11-03 DIAGNOSIS — I872 Venous insufficiency (chronic) (peripheral): Secondary | ICD-10-CM | POA: Diagnosis not present

## 2019-11-03 LAB — COMPLETE METABOLIC PANEL WITH GFR
AG Ratio: 1.8 (calc) (ref 1.0–2.5)
ALT: 17 U/L (ref 6–29)
AST: 18 U/L (ref 10–35)
Albumin: 3.9 g/dL (ref 3.6–5.1)
Alkaline phosphatase (APISO): 116 U/L (ref 37–153)
BUN/Creatinine Ratio: 15 (calc) (ref 6–22)
BUN: 14 mg/dL (ref 7–25)
CO2: 27 mmol/L (ref 20–32)
Calcium: 9.1 mg/dL (ref 8.6–10.4)
Chloride: 107 mmol/L (ref 98–110)
Creat: 0.94 mg/dL — ABNORMAL HIGH (ref 0.60–0.93)
GFR, Est African American: 69 mL/min/{1.73_m2} (ref >=60)
GFR, Est Non African American: 59 mL/min/{1.73_m2} — ABNORMAL LOW (ref >=60)
Globulin: 2.2 g/dL (calc) (ref 1.9–3.7)
Glucose, Bld: 91 mg/dL (ref 65–99)
Potassium: 4.4 mmol/L (ref 3.5–5.3)
Sodium: 142 mmol/L (ref 135–146)
Total Bilirubin: 0.4 mg/dL (ref 0.2–1.2)
Total Protein: 6.1 g/dL (ref 6.1–8.1)

## 2019-11-03 LAB — LIPID PANEL W/REFLEX DIRECT LDL
Cholesterol: 113 mg/dL (ref <200)
HDL: 39 mg/dL — ABNORMAL LOW (ref >=50)
LDL Cholesterol (Calc): 54 mg/dL (calc)
Non-HDL Cholesterol (Calc): 74 mg/dL (calc) (ref <130)
Total CHOL/HDL Ratio: 2.9 (calc) (ref <5.0)
Triglycerides: 117 mg/dL (ref <150)

## 2019-11-03 NOTE — Progress Notes (Signed)
LDL to goal. Great cholesterol.  Kidney function down just a bit.

## 2019-11-05 NOTE — Telephone Encounter (Signed)
Received fax from the [employer] they approved coverage on Hydroxyzine from 07/31/19 - 10/28/2020. - CF

## 2019-11-15 ENCOUNTER — Other Ambulatory Visit: Payer: Self-pay | Admitting: Physician Assistant

## 2019-11-15 DIAGNOSIS — J42 Unspecified chronic bronchitis: Secondary | ICD-10-CM

## 2019-11-19 ENCOUNTER — Other Ambulatory Visit: Payer: Self-pay | Admitting: Physician Assistant

## 2019-11-19 DIAGNOSIS — F410 Panic disorder [episodic paroxysmal anxiety] without agoraphobia: Secondary | ICD-10-CM

## 2019-11-29 ENCOUNTER — Other Ambulatory Visit: Payer: Self-pay | Admitting: Physician Assistant

## 2019-11-29 DIAGNOSIS — E119 Type 2 diabetes mellitus without complications: Secondary | ICD-10-CM

## 2019-12-04 ENCOUNTER — Other Ambulatory Visit: Payer: Self-pay | Admitting: Physician Assistant

## 2019-12-04 DIAGNOSIS — F329 Major depressive disorder, single episode, unspecified: Secondary | ICD-10-CM

## 2019-12-04 DIAGNOSIS — F32A Depression, unspecified: Secondary | ICD-10-CM

## 2019-12-04 DIAGNOSIS — G894 Chronic pain syndrome: Secondary | ICD-10-CM

## 2019-12-19 ENCOUNTER — Other Ambulatory Visit: Payer: Self-pay

## 2019-12-19 ENCOUNTER — Ambulatory Visit (HOSPITAL_BASED_OUTPATIENT_CLINIC_OR_DEPARTMENT_OTHER)
Admission: RE | Admit: 2019-12-19 | Discharge: 2019-12-19 | Disposition: A | Discharge status: Home / Self Care | Payer: Medicare Other | Source: Ambulatory Visit | Attending: Physician Assistant | Admitting: Physician Assistant

## 2019-12-19 DIAGNOSIS — I872 Venous insufficiency (chronic) (peripheral): Secondary | ICD-10-CM | POA: Insufficient documentation

## 2019-12-19 DIAGNOSIS — R0602 Shortness of breath: Secondary | ICD-10-CM | POA: Diagnosis not present

## 2019-12-19 LAB — ECHOCARDIOGRAM COMPLETE
AR max vel: 2.13 cm2
AV Area VTI: 2.02 cm2
AV Area mean vel: 1.88 cm2
AV Mean grad: 6 mmHg
AV Peak grad: 10.1 mmHg
Ao pk vel: 1.59 m/s
Area-P 1/2: 3.68 cm2
S' Lateral: 3.43 cm

## 2019-12-19 MED ORDER — PERFLUTREN LIPID MICROSPHERE
1.0000 mL | INTRAVENOUS | Status: AC | PRN
  Filled 2019-12-19: qty 10

## 2019-12-22 NOTE — Progress Notes (Signed)
Sakari,   Ejection fraction is 60-65 percent whic hhis good.  You have some minor diastolic dysfunction.  Slightly dilated left atrium. Valves look good.   These are really good echo results.

## 2019-12-26 DIAGNOSIS — H353133 Nonexudative age-related macular degeneration, bilateral, advanced atrophic without subfoveal involvement: Secondary | ICD-10-CM | POA: Diagnosis not present

## 2019-12-26 DIAGNOSIS — H35372 Puckering of macula, left eye: Secondary | ICD-10-CM | POA: Diagnosis not present

## 2019-12-26 DIAGNOSIS — H43811 Vitreous degeneration, right eye: Secondary | ICD-10-CM | POA: Diagnosis not present

## 2019-12-26 DIAGNOSIS — H35033 Hypertensive retinopathy, bilateral: Secondary | ICD-10-CM | POA: Diagnosis not present

## 2019-12-30 ENCOUNTER — Ambulatory Visit: Payer: Medicare Other | Admitting: Sports Medicine

## 2020-01-01 DIAGNOSIS — M47816 Spondylosis without myelopathy or radiculopathy, lumbar region: Secondary | ICD-10-CM | POA: Diagnosis not present

## 2020-01-01 DIAGNOSIS — M5136 Other intervertebral disc degeneration, lumbar region: Secondary | ICD-10-CM | POA: Diagnosis not present

## 2020-01-01 DIAGNOSIS — M48061 Spinal stenosis, lumbar region without neurogenic claudication: Secondary | ICD-10-CM | POA: Diagnosis not present

## 2020-01-01 DIAGNOSIS — M533 Sacrococcygeal disorders, not elsewhere classified: Secondary | ICD-10-CM | POA: Diagnosis not present

## 2020-01-09 LAB — HM DIABETES EYE EXAM

## 2020-01-20 ENCOUNTER — Encounter: Payer: Self-pay | Admitting: Sports Medicine

## 2020-01-20 ENCOUNTER — Ambulatory Visit (INDEPENDENT_AMBULATORY_CARE_PROVIDER_SITE_OTHER): Payer: Medicare Other

## 2020-01-20 ENCOUNTER — Ambulatory Visit: Payer: Medicare Other | Admitting: Sports Medicine

## 2020-01-20 ENCOUNTER — Other Ambulatory Visit: Payer: Self-pay

## 2020-01-20 ENCOUNTER — Ambulatory Visit (INDEPENDENT_AMBULATORY_CARE_PROVIDER_SITE_OTHER): Payer: Medicare Other | Admitting: Sports Medicine

## 2020-01-20 DIAGNOSIS — M25571 Pain in right ankle and joints of right foot: Secondary | ICD-10-CM

## 2020-01-20 DIAGNOSIS — G8929 Other chronic pain: Secondary | ICD-10-CM

## 2020-01-20 DIAGNOSIS — M19071 Primary osteoarthritis, right ankle and foot: Secondary | ICD-10-CM | POA: Diagnosis not present

## 2020-01-20 DIAGNOSIS — M25474 Effusion, right foot: Secondary | ICD-10-CM | POA: Diagnosis not present

## 2020-01-20 DIAGNOSIS — Z23 Encounter for immunization: Secondary | ICD-10-CM | POA: Diagnosis not present

## 2020-01-20 NOTE — Progress Notes (Signed)
    Procedures performed today:    None.  Independent interpretation of notes and tests performed by another provider:   None.  Brief History, Exam, Impression, and Recommendations:    Right ankle pain This is a pleasant 74 year old female, she has a history of right foot polytrauma she has had pain that she localizes around her sinus tarsi laterally, she also tends to overpronate. I would like her to get some custom molded orthotics with Dr. Raeford Razor, she can continue her meloxicam and oxycodone. We are getting some x-rays today. Return to see me in 2 to 4 weeks and we can consider an ankle joint injection at the sinus tarsi if persistent discomfort.    ___________________________________________ Gwen Her. Dianah Field, M.D., ABFM., CAQSM. Primary Care and [place] Instructor of [place] [employer] [facility] of Medicine

## 2020-01-20 NOTE — Assessment & Plan Note (Signed)
This is a pleasant 74 year old female, she has a history of right foot polytrauma she has had pain that she localizes around her sinus tarsi laterally, she also tends to overpronate. I would like her to get some custom molded orthotics with Dr. Raeford Razor, she can continue her meloxicam and oxycodone. We are getting some x-rays today. Return to see me in 2 to 4 weeks and we can consider an ankle joint injection at the sinus tarsi if persistent discomfort.

## 2020-01-22 DIAGNOSIS — M76821 Posterior tibial tendinitis, right leg: Secondary | ICD-10-CM | POA: Diagnosis not present

## 2020-01-22 DIAGNOSIS — L84 Corns and callosities: Secondary | ICD-10-CM | POA: Diagnosis not present

## 2020-01-22 DIAGNOSIS — M25571 Pain in right ankle and joints of right foot: Secondary | ICD-10-CM | POA: Diagnosis not present

## 2020-01-22 DIAGNOSIS — L603 Nail dystrophy: Secondary | ICD-10-CM | POA: Diagnosis not present

## 2020-01-22 DIAGNOSIS — S93321D Subluxation of tarsometatarsal joint of right foot, subsequent encounter: Secondary | ICD-10-CM | POA: Diagnosis not present

## 2020-01-22 DIAGNOSIS — E1151 Type 2 diabetes mellitus with diabetic peripheral angiopathy without gangrene: Secondary | ICD-10-CM | POA: Diagnosis not present

## 2020-01-28 ENCOUNTER — Ambulatory Visit (INDEPENDENT_AMBULATORY_CARE_PROVIDER_SITE_OTHER): Payer: Medicare Other | Admitting: Physician Assistant

## 2020-01-28 ENCOUNTER — Other Ambulatory Visit: Payer: Self-pay

## 2020-01-28 ENCOUNTER — Encounter: Payer: Self-pay | Admitting: Physician Assistant

## 2020-01-28 VITALS — BP 122/51 | HR 73 | Ht 65.0 in | Wt 229.0 lb

## 2020-01-28 DIAGNOSIS — W57XXXA Bitten or stung by nonvenomous insect and other nonvenomous arthropods, initial encounter: Secondary | ICD-10-CM | POA: Diagnosis not present

## 2020-01-28 DIAGNOSIS — S70362A Insect bite (nonvenomous), left thigh, initial encounter: Secondary | ICD-10-CM | POA: Diagnosis not present

## 2020-01-28 DIAGNOSIS — R110 Nausea: Secondary | ICD-10-CM

## 2020-01-28 DIAGNOSIS — J449 Chronic obstructive pulmonary disease, unspecified: Secondary | ICD-10-CM | POA: Diagnosis not present

## 2020-01-28 DIAGNOSIS — E119 Type 2 diabetes mellitus without complications: Secondary | ICD-10-CM

## 2020-01-28 LAB — POCT GLYCOSYLATED HEMOGLOBIN (HGB A1C): Hemoglobin A1C: 5.5 % (ref 4.0–5.6)

## 2020-01-28 MED ORDER — OZEMPIC (1 MG/DOSE) 2 MG/1.5ML ~~LOC~~ SOPN: 1.0000 mg | PEN_INJECTOR | SUBCUTANEOUS | 0 refills | Status: DC

## 2020-01-28 MED ORDER — DOXYCYCLINE HYCLATE 100 MG PO TABS: 100.0000 mg | ORAL_TABLET | Freq: Two times a day (BID) | ORAL | 0 refills | Status: DC

## 2020-01-28 MED ORDER — ONDANSETRON HCL 8 MG PO TABS: 8.0000 mg | ORAL_TABLET | Freq: Three times a day (TID) | ORAL | 2 refills | Status: DC | PRN

## 2020-01-28 NOTE — Progress Notes (Signed)
Subjective:    Patient ID: Sarah Wright, female    DOB: [birth year], 74 y.o.   MRN: [id number]  HPI Pt is a 74 yo obese female with COPD, T2DM, chronic low back pain who presents to the clinic for 3 month follow up.   Pt is doing well. She is taking ozempic. She is not exercising. She is not checking sugars. No open sores or wounds. No hypoglycemic events.   COPD- doing well on trelegy. Does not have nebulizer machine and needs one for exacerbations.   She woke up at 12:30am this morning feeling like something had bit her left leg. She woke up with upper medial left thigh bite marks with swelling, redness and pain. No fever, chills, rash. Seems to be getting a little wider from this morning.   .. Active Ambulatory Problems    Diagnosis Date Noted   Anxiety and depression 03/16/2014   Hyperlipidemia 03/16/2014   COLD (chronic obstructive lung disease) ([place]) 03/16/2014   Insomnia 03/16/2014   Esophageal reflux 03/16/2014   Bilateral hand pain 03/16/2014   DDD (degenerative disc disease), lumbar 03/16/2014   Lumbar spondylosis 03/17/2014   Aortic atherosclerosis ([place]) 03/17/2014   Chronic venous insufficiency 03/27/2014   H/O colonoscopy 04/02/2014   Thyroid disease 04/02/2014   Cataract 04/02/2014   Type 2 diabetes mellitus ([place]) 04/02/2014   Primary osteoarthritis of both knees 07/21/2014   Morbid obesity ([place]) 10/07/2014   Dizziness 01/04/2015   Sensorineural hearing loss of both ears 01/26/2015   Thyroid nodule 06/22/2015   Memory loss 07/29/2015   Psoriasis 09/23/2015   Right shoulder injury 01/27/2016   Balance problem 08/13/2016   Left shoulder pain 08/13/2016   Numbness around mouth 09/08/2016   DDD (degenerative disc disease), cervical 12/29/2016   Microalbuminuria 01/30/2017   Chronic pain syndrome 05/06/2017   Skin abrasion 08/01/2017   Leukocytes in urine 08/01/2017   Dysuria 08/01/2017   Panic attack 08/01/2017   RLS  (restless legs syndrome) 08/12/2018   COPD exacerbation ([place]) 08/14/2019   Right ankle pain 01/20/2020   Resolved Ambulatory Problems    Diagnosis Date Noted   No Resolved Ambulatory Problems   Past Medical History:  Diagnosis Date   Diabetes mellitus without complication ([place])       Review of Systems  All other systems reviewed and are negative.      Objective:   Physical Exam Vitals reviewed.  Constitutional:      Appearance: Normal appearance. She is obese.  HENT:     Head: Normocephalic.  Cardiovascular:     Rate and Rhythm: Normal rate and regular rhythm.  Pulmonary:     Effort: Pulmonary effort is normal.     Breath sounds: Normal breath sounds.  Skin:    Comments: Left medial upper thigh right above knee. 3 puntate holes with some surrounding blanching and then erythema and tenderness. Slightly warm to touch.   Neurological:     General: No focal deficit present.     Mental Status: She is alert and oriented to person, place, and time.  Psychiatric:        Mood and Affect: Mood normal.       .. Lab Results  Component Value Date   HGBA1C 5.5 01/28/2020       Assessment & Plan:  [name] KitchenMarland KitchenLuanne was seen today for diabetes.  Diagnoses and all orders for this visit:  Type 2 diabetes mellitus without complication, without long-term current use of insulin ([place]) -  POCT glycosylated hemoglobin (Hb A1C) -     Semaglutide, 1 MG/DOSE, (OZEMPIC, 1 MG/DOSE,) 2 MG/1.5ML SOPN; Inject 1 mg into the skin once a week.  Nausea -     ondansetron (ZOFRAN) 8 MG tablet; Take 1 tablet (8 mg total) by mouth every 8 (eight) hours as needed for nausea or vomiting.  Chronic obstructive pulmonary disease, unspecified COPD type ([place]) -     For home use only DME Nebulizer machine  Insect bite of left thigh, initial encounter -     doxycycline (VIBRA-TABS) 100 MG tablet; Take 1 tablet (100 mg total) by mouth 2 (two) times daily.   A1C is great. Continue same medications.   BP to goal. On statin. LDL to goal.  Eye exam UTD. Foot exam UTD.  Pneumonia/flu/covid UTD.   Treated insect bite and surrounding cellulitis with doxycycline. Discussed warm compresses. Pt has wound outlined if growing beyond borders please call. Keep elevated for next few days.   COPD-controlled. Needed nebulizer machine to go with solution to use as needed.   Follow up in 3 months.

## 2020-01-28 NOTE — Patient Instructions (Signed)

## 2020-02-05 ENCOUNTER — Encounter: Payer: Medicare Other | Admitting: Family Medicine

## 2020-02-05 NOTE — Progress Notes (Deleted)
Sarah Wright - 74 y.o. female MRN [id number]  Date of birth: 1945/07/05  SUBJECTIVE:  Including CC & ROS.  No chief complaint on file.   Sarah Wright is a 74 y.o. female that is  ***.  ***   Review of Systems See HPI   HISTORY: Past Medical, Surgical, Social, and Family History Reviewed & Updated per EMR.   Pertinent Historical Findings include:  Past Medical History:  Diagnosis Date  . Diabetes mellitus without complication ([place])   . Hyperlipidemia   . Thyroid disease     Past Surgical History:  Procedure Laterality Date  . ABDOMINAL HYSTERECTOMY    . CARPAL TUNNEL RELEASE     both wrists  . CATARACT EXTRACTION, BILATERAL    . CERVICAL FUSION    . GALLBLADDER SURGERY    . right foot surgery    . THYROIDECTOMY, PARTIAL     right side removed    Family History  Problem Relation Age of Onset  . Uterine cancer Unknown   . Pancreatic cancer Unknown   . Heart attack Father   . Diabetes Father   . Depression Mother   . Hypertension Unknown        parents    Social History   Socioeconomic History  . Marital status: Divorced    Spouse name: Not on file  . Number of children: Not on file  . Years of education: Not on file  . Highest education level: Not on file  Occupational History  . Not on file  Tobacco Use  . Smoking status: Former Smoker    Quit date: 02/12/2014    Years since quitting: 5.9  . Smokeless tobacco: Never Used  Substance and Sexual Activity  . Alcohol use: No    Alcohol/week: 0.0 standard drinks  . Drug use: No  . Sexual activity: Not Currently  Other Topics Concern  . Not on file  Social History Narrative  . Not on file   Social Determinants of Health   Financial Resource Strain:   . Difficulty of Paying Living Expenses: Not on file  Food Insecurity:   . Worried About Charity fundraiser in the Last Year: Not on file  . Ran Out of Food in the Last Year: Not on file  Transportation Needs:   . Lack of Transportation (Medical):  Not on file  . Lack of Transportation (Non-Medical): Not on file  Physical Activity:   . Days of Exercise per Week: Not on file  . Minutes of Exercise per Session: Not on file  Stress:   . Feeling of Stress : Not on file  Social Connections:   . Frequency of Communication with Friends and Family: Not on file  . Frequency of Social Gatherings with Friends and Family: Not on file  . Attends Religious Services: Not on file  . Active Member of Clubs or Organizations: Not on file  . Attends Archivist Meetings: Not on file  . Marital Status: Not on file  Intimate Partner Violence:   . Fear of Current or Ex-Partner: Not on file  . Emotionally Abused: Not on file  . Physically Abused: Not on file  . Sexually Abused: Not on file     PHYSICAL EXAM:  VS: There were no vitals taken for this visit. Physical Exam Gen: NAD, alert, cooperative with exam, well-appearing MSK:  ***      ASSESSMENT & PLAN:   No problem-specific Assessment & Plan notes found for this encounter.

## 2020-02-17 ENCOUNTER — Ambulatory Visit: Payer: Medicare Other | Admitting: Physician Assistant

## 2020-02-18 ENCOUNTER — Encounter: Payer: Self-pay | Admitting: Physician Assistant

## 2020-02-20 ENCOUNTER — Telehealth (INDEPENDENT_AMBULATORY_CARE_PROVIDER_SITE_OTHER): Payer: Medicare Other | Admitting: Physician Assistant

## 2020-02-20 ENCOUNTER — Encounter: Payer: Self-pay | Admitting: Physician Assistant

## 2020-02-20 VITALS — Wt 228.0 lb

## 2020-02-20 DIAGNOSIS — J069 Acute upper respiratory infection, unspecified: Secondary | ICD-10-CM | POA: Diagnosis not present

## 2020-02-20 DIAGNOSIS — F430 Acute stress reaction: Secondary | ICD-10-CM

## 2020-02-20 DIAGNOSIS — J441 Chronic obstructive pulmonary disease with (acute) exacerbation: Secondary | ICD-10-CM | POA: Diagnosis not present

## 2020-02-20 MED ORDER — PREDNISONE 20 MG PO TABS: ORAL_TABLET | ORAL | 0 refills | Status: DC

## 2020-02-20 MED ORDER — DOXYCYCLINE HYCLATE 100 MG PO TABS: 100.0000 mg | ORAL_TABLET | Freq: Two times a day (BID) | ORAL | 0 refills | Status: DC

## 2020-02-20 NOTE — Progress Notes (Signed)
..Virtual Visit via Video Note  I connected with Sarah Wright on 02/20/2020 at  9:50 AM EDT by a video enabled telemedicine application and verified that I am speaking with the correct person using two identifiers.  Location: Patient: home Provider: clinic   I discussed the limitations of evaluation and management by telemedicine and the availability of in person appointments. The patient expressed understanding and agreed to proceed.  History of Present Illness:  Patient is a 74 year old obese female with COPD, type 2 diabetes who calls into the clinic with 3 days of productive cough, sinus congestion, weakness.  Her granddaughter had cold-like symptoms as well and tested negative for Covid.  Patient is checking her oxygen saturation and staying around 96%.  She is having some low-grade fevers and body aches.  She does have quite a bit of sinus pressure and congestion.  Her cough is worsening.  She does feel more short of breath today.  She is on Trelegy for her COPD.  She has been out of it for the last 2 days.  Patient has a lot of stressful situations going on.  Her granddaughter was just diagnosed with brain tumor.  She goes today to get her PICC line and.  All family is very emotional.   .. Active Ambulatory Problems    Diagnosis Date Noted   Anxiety and depression 03/16/2014   Hyperlipidemia 03/16/2014   COLD (chronic obstructive lung disease) ([place]) 03/16/2014   Insomnia 03/16/2014   Esophageal reflux 03/16/2014   Bilateral hand pain 03/16/2014   DDD (degenerative disc disease), lumbar 03/16/2014   Lumbar spondylosis 03/17/2014   Aortic atherosclerosis ([place]) 03/17/2014   Chronic venous insufficiency 03/27/2014   H/O colonoscopy 04/02/2014   Thyroid disease 04/02/2014   Cataract 04/02/2014   Type 2 diabetes mellitus ([place]) 04/02/2014   Primary osteoarthritis of both knees 07/21/2014   Morbid obesity ([place]) 10/07/2014   Dizziness 01/04/2015   Sensorineural  hearing loss of both ears 01/26/2015   Thyroid nodule 06/22/2015   Memory loss 07/29/2015   Psoriasis 09/23/2015   Right shoulder injury 01/27/2016   Balance problem 08/13/2016   Left shoulder pain 08/13/2016   Numbness around mouth 09/08/2016   DDD (degenerative disc disease), cervical 12/29/2016   Microalbuminuria 01/30/2017   Chronic pain syndrome 05/06/2017   Skin abrasion 08/01/2017   Leukocytes in urine 08/01/2017   Dysuria 08/01/2017   Panic attack 08/01/2017   RLS (restless legs syndrome) 08/12/2018   COPD exacerbation ([place]) 08/14/2019   Right ankle pain 01/20/2020   Resolved Ambulatory Problems    Diagnosis Date Noted   No Resolved Ambulatory Problems   Past Medical History:  Diagnosis Date   Diabetes mellitus without complication ([place])     Reviewed med, allergy, problem list.     Observations/Objective: No acute distress Productive cough with no labored breathing. Normal appearance.  Very tearful.   .. Today's Vitals   02/20/20 0957  Weight: 228 lb (103.4 kg)   Body mass index is 37.94 kg/m.    Assessment and Plan: [name] KitchenMarland KitchenLuanne was seen today for copd.  Diagnoses and all orders for this visit:  COPD exacerbation ([place]) -     doxycycline (VIBRA-TABS) 100 MG tablet; Take 1 tablet (100 mg total) by mouth 2 (two) times daily. -     predniSONE (DELTASONE) 20 MG tablet; Take 3 tablets for 3 days, take 2 tablets for 3 days, take 1 tablet for 3 days, take 1/2 tablet for 4 days.  Viral URI  Acute reaction  to stress   Likely viral infection causing COPD exacerbation.  Cannot completely rule out Covid.  Encouraged her to come back for testing today.  Patient cannot get a ride today.  I would like the patient to continue to self quarantine.  If not significantly improving with COPD exacerbation treatment please consider getting tested.  She would be a candidate for monoclonal antibodies.  Rest and hydrate.  Continue use other symptomatic  treatment.  Okay to use albuterol as needed to keep airway open.  Continue to monitor O2 saturations.  If they are dropping please go to urgent care or emergency room.  Discussed stress with granddaughter's new diagnosis.  Certainly if she needs anything please follow-up.  Strongly considered a good support system within the family.   Follow Up Instructions:    I discussed the assessment and treatment plan with the patient. The patient was provided an opportunity to ask questions and all were answered. The patient agreed with the plan and demonstrated an understanding of the instructions.   The patient was advised to call back or seek an in-person evaluation if the symptoms worsen or if the condition fails to improve as anticipated.     [name], PA-C

## 2020-03-10 DIAGNOSIS — M5136 Other intervertebral disc degeneration, lumbar region: Secondary | ICD-10-CM | POA: Diagnosis not present

## 2020-03-10 DIAGNOSIS — M47816 Spondylosis without myelopathy or radiculopathy, lumbar region: Secondary | ICD-10-CM | POA: Diagnosis not present

## 2020-03-10 DIAGNOSIS — Z5181 Encounter for therapeutic drug level monitoring: Secondary | ICD-10-CM | POA: Diagnosis not present

## 2020-03-10 DIAGNOSIS — M533 Sacrococcygeal disorders, not elsewhere classified: Secondary | ICD-10-CM | POA: Diagnosis not present

## 2020-03-10 DIAGNOSIS — M48061 Spinal stenosis, lumbar region without neurogenic claudication: Secondary | ICD-10-CM | POA: Diagnosis not present

## 2020-03-10 DIAGNOSIS — Z79899 Other long term (current) drug therapy: Secondary | ICD-10-CM | POA: Diagnosis not present

## 2020-03-15 ENCOUNTER — Other Ambulatory Visit: Payer: Self-pay | Admitting: Physician Assistant

## 2020-03-15 ENCOUNTER — Other Ambulatory Visit: Payer: Self-pay | Admitting: Neurology

## 2020-03-15 DIAGNOSIS — I872 Venous insufficiency (chronic) (peripheral): Secondary | ICD-10-CM

## 2020-03-15 MED ORDER — FUROSEMIDE 20 MG PO TABS: ORAL_TABLET | ORAL | 0 refills | Status: DC

## 2020-03-16 ENCOUNTER — Ambulatory Visit (INDEPENDENT_AMBULATORY_CARE_PROVIDER_SITE_OTHER): Payer: Medicare Other | Admitting: Physician Assistant

## 2020-03-16 ENCOUNTER — Encounter: Payer: Self-pay | Admitting: Physician Assistant

## 2020-03-16 VITALS — BP 126/52 | HR 65 | Temp 97.8°F | Ht 65.0 in | Wt 231.0 lb

## 2020-03-16 DIAGNOSIS — R350 Frequency of micturition: Secondary | ICD-10-CM | POA: Diagnosis not present

## 2020-03-16 DIAGNOSIS — R3915 Urgency of urination: Secondary | ICD-10-CM

## 2020-03-16 DIAGNOSIS — J411 Mucopurulent chronic bronchitis: Secondary | ICD-10-CM

## 2020-03-16 DIAGNOSIS — R252 Cramp and spasm: Secondary | ICD-10-CM | POA: Diagnosis not present

## 2020-03-16 DIAGNOSIS — Z87440 Personal history of urinary (tract) infections: Secondary | ICD-10-CM | POA: Diagnosis not present

## 2020-03-16 MED ORDER — NITROFURANTOIN MONOHYD MACRO 100 MG PO CAPS: 100.0000 mg | ORAL_CAPSULE | Freq: Two times a day (BID) | ORAL | 0 refills | Status: DC

## 2020-03-16 MED ORDER — IPRATROPIUM-ALBUTEROL 0.5-2.5 (3) MG/3ML IN SOLN: 3.0000 mL | RESPIRATORY_TRACT | 1 refills | Status: DC | PRN

## 2020-03-16 NOTE — Progress Notes (Signed)
Subjective:    Patient ID: [name], female    DOB: 22-May-1945, 74 y.o.   MRN: [id number]  HPI  Patient is a obese 74 year old female with COPD, type 2 diabetes, chronic pain, psoriasis who presents to the clinic with urinary symptoms for the last 2 days.  She complains mostly of urinary urgency and frequency.  Her last UTI was in June of this year.  She gets these recurrently and she knows how they feel.  She denies any fever, chills, abdominal pain, flank pain, nausea.  She does feel like a more barking cough is started with her COPD.  She admits she does not have albuterol or DuoNeb.  She is using Trelegy.  She does have some chronic rhinitis.  She reports some worsening leg jumping and cramps at night for last week. She is on requip 3 tablets nightly. No pain with walking.    .. Active Ambulatory Problems    Diagnosis Date Noted  . Anxiety and depression 03/16/2014  . Hyperlipidemia 03/16/2014  . COLD (chronic obstructive lung disease) ([place]) 03/16/2014  . Insomnia 03/16/2014  . Esophageal reflux 03/16/2014  . Bilateral hand pain 03/16/2014  . DDD (degenerative disc disease), lumbar 03/16/2014  . Lumbar spondylosis 03/17/2014  . Aortic atherosclerosis ([place]) 03/17/2014  . Chronic venous insufficiency 03/27/2014  . H/O colonoscopy 04/02/2014  . Thyroid disease 04/02/2014  . Cataract 04/02/2014  . Type 2 diabetes mellitus ([place]) 04/02/2014  . Primary osteoarthritis of both knees 07/21/2014  . Morbid obesity ([place]) 10/07/2014  . Dizziness 01/04/2015  . Sensorineural hearing loss of both ears 01/26/2015  . Thyroid nodule 06/22/2015  . Memory loss 07/29/2015  . Psoriasis 09/23/2015  . Right shoulder injury 01/27/2016  . Balance problem 08/13/2016  . Left shoulder pain 08/13/2016  . Numbness around mouth 09/08/2016  . DDD (degenerative disc disease), cervical 12/29/2016  . Microalbuminuria 01/30/2017  . Chronic pain syndrome 05/06/2017  . Skin abrasion 08/01/2017  .  Leukocytes in urine 08/01/2017  . Dysuria 08/01/2017  . Panic attack 08/01/2017  . RLS (restless legs syndrome) 08/12/2018  . COPD exacerbation ([place]) 08/14/2019  . Right ankle pain 01/20/2020   Resolved Ambulatory Problems    Diagnosis Date Noted  . No Resolved Ambulatory Problems   Past Medical History:  Diagnosis Date  . Diabetes mellitus without complication ([place])       Review of Systems See HPI.     Objective:   Physical Exam Vitals reviewed.  Constitutional:      Appearance: She is obese.  HENT:     Head: Normocephalic.     Right Ear: Tympanic membrane normal.     Left Ear: Tympanic membrane normal.     Nose: Rhinorrhea present.     Mouth/Throat:     Mouth: Mucous membranes are moist.  Cardiovascular:     Rate and Rhythm: Normal rate and regular rhythm.     Pulses: Normal pulses.  Pulmonary:     Effort: Pulmonary effort is normal.     Breath sounds: Normal breath sounds.  Abdominal:     General: Bowel sounds are normal. There is no distension.     Palpations: Abdomen is soft.     Tenderness: There is no abdominal tenderness. There is no right CVA tenderness, left CVA tenderness or guarding.  Neurological:     General: No focal deficit present.     Mental Status: She is alert and oriented to person, place, and time.  Psychiatric:  Mood and Affect: Mood normal.           Assessment & Plan:  [name] KitchenMarland KitchenLuanne was seen today for urinary frequency.  Diagnoses and all orders for this visit:  Urinary frequency -     POCT URINALYSIS DIP (CLINITEK) -     nitrofurantoin, macrocrystal-monohydrate, (MACROBID) 100 MG capsule; Take 1 capsule (100 mg total) by mouth 2 (two) times daily. -     Urine Culture  Urinary urgency -     nitrofurantoin, macrocrystal-monohydrate, (MACROBID) 100 MG capsule; Take 1 capsule (100 mg total) by mouth 2 (two) times daily. -     Urine Culture  History of UTI -     nitrofurantoin, macrocrystal-monohydrate, (MACROBID) 100 MG  capsule; Take 1 capsule (100 mg total) by mouth 2 (two) times daily. -     Urine Culture  Leg cramps  Mucopurulent chronic bronchitis (HCC) -     ipratropium-albuterol (DUONEB) 0.5-2.5 (3) MG/3ML SOLN; Take 3 mLs by nebulization every 4 (four) hours as needed.   We will treat empirically due to symptoms.  Patient has responded well to Macrobid in the past.  I did give patient a urine cup to collect sample and take to the lab for urine culture.  This will provide [place] information if she is not getting better with Macrobid.  Encourage symptomatic care with increasing hydration and cranberry juice.  For leg cramps suggest increasing Requip by 1 tablet to total 1 mg at bedtime.  Certainly make sure drinking enough water and make sure sugars are staying well controlled.  Continue trilogy for COPD.  Her lungs sound great today.  Her vitals are reassuring.  I will add DuoNeb to use every 4-6 hours for cough and shortness of breath.  Follow-up as needed.

## 2020-03-16 NOTE — Patient Instructions (Signed)
Urinary Tract Infection, Adult A urinary tract infection (UTI) is an infection of any part of the urinary tract. The urinary tract includes:  The kidneys.  The ureters.  The bladder.  The urethra. These organs make, store, and get rid of pee (urine) in the body. What are the causes? This is caused by germs (bacteria) in your genital area. These germs grow and cause swelling (inflammation) of your urinary tract. What increases the risk? You are more likely to develop this condition if:  You have a small, thin tube (catheter) to drain pee.  You cannot control when you pee or poop (incontinence).  You are female, and: ? You use these methods to prevent pregnancy:  A medicine that kills sperm (spermicide).  A device that blocks sperm (diaphragm). ? You have low levels of a female hormone (estrogen). ? You are pregnant.  You have genes that add to your risk.  You are sexually active.  You take antibiotic medicines.  You have trouble peeing because of: ? A prostate that is bigger than normal, if you are female. ? A blockage in the part of your body that drains pee from the bladder (urethra). ? A kidney stone. ? A nerve condition that affects your bladder (neurogenic bladder). ? Not getting enough to drink. ? Not peeing often enough.  You have other conditions, such as: ? Diabetes. ? A weak disease-fighting system (immune system). ? Sickle cell disease. ? Gout. ? Injury of the spine. What are the signs or symptoms? Symptoms of this condition include:  Needing to pee right away (urgently).  Peeing often.  Peeing small amounts often.  Pain or burning when peeing.  Blood in the pee.  Pee that smells bad or not like normal.  Trouble peeing.  Pee that is cloudy.  Fluid coming from the vagina, if you are female.  Pain in the belly or lower back. Other symptoms include:  Throwing up (vomiting).  No urge to eat.  Feeling mixed up (confused).  Being tired  and grouchy (irritable).  A fever.  Watery poop (diarrhea). How is this treated? This condition may be treated with:  Antibiotic medicine.  Other medicines.  Drinking enough water. Follow these instructions at home:  Medicines  Take over-the-counter and prescription medicines only as told by your doctor.  If you were prescribed an antibiotic medicine, take it as told by your doctor. Do not stop taking it even if you start to feel better. General instructions  Make sure you: ? Pee until your bladder is empty. ? Do not hold pee for a long time. ? Empty your bladder after sex. ? Wipe from front to back after pooping if you are a female. Use each tissue one time when you wipe.  Drink enough fluid to keep your pee pale yellow.  Keep all follow-up visits as told by your doctor. This is important. Contact a doctor if:  You do not get better after 1-2 days.  Your symptoms go away and then come back. Get help right away if:  You have very bad back pain.  You have very bad pain in your lower belly.  You have a fever.  You are sick to your stomach (nauseous).  You are throwing up. Summary  A urinary tract infection (UTI) is an infection of any part of the urinary tract.  This condition is caused by germs in your genital area.  There are many risk factors for a UTI. These include having a small, thin   tube to drain pee and not being able to control when you pee or poop.  Treatment includes antibiotic medicines for germs.  Drink enough fluid to keep your pee pale yellow. This information is not intended to replace advice given to you by your health care provider. Make sure you discuss any questions you have with your health care provider. Document Revised: 03/28/2018 Document Reviewed: 10/18/2017 Elsevier Patient Education  2020 Elsevier Inc.  

## 2020-03-17 ENCOUNTER — Other Ambulatory Visit: Payer: Self-pay | Admitting: *Deleted

## 2020-03-17 DIAGNOSIS — J411 Mucopurulent chronic bronchitis: Secondary | ICD-10-CM

## 2020-03-17 MED ORDER — IPRATROPIUM-ALBUTEROL 0.5-2.5 (3) MG/3ML IN SOLN: 3.0000 mL | RESPIRATORY_TRACT | 1 refills | Status: DC | PRN

## 2020-04-12 ENCOUNTER — Other Ambulatory Visit: Payer: Self-pay | Admitting: Physician Assistant

## 2020-04-12 DIAGNOSIS — J42 Unspecified chronic bronchitis: Secondary | ICD-10-CM

## 2020-04-28 DIAGNOSIS — M533 Sacrococcygeal disorders, not elsewhere classified: Secondary | ICD-10-CM | POA: Diagnosis not present

## 2020-04-28 DIAGNOSIS — M5136 Other intervertebral disc degeneration, lumbar region: Secondary | ICD-10-CM | POA: Diagnosis not present

## 2020-04-28 DIAGNOSIS — G894 Chronic pain syndrome: Secondary | ICD-10-CM | POA: Diagnosis not present

## 2020-04-28 DIAGNOSIS — M47816 Spondylosis without myelopathy or radiculopathy, lumbar region: Secondary | ICD-10-CM | POA: Diagnosis not present

## 2020-04-28 DIAGNOSIS — E1151 Type 2 diabetes mellitus with diabetic peripheral angiopathy without gangrene: Secondary | ICD-10-CM | POA: Diagnosis not present

## 2020-04-28 DIAGNOSIS — M48061 Spinal stenosis, lumbar region without neurogenic claudication: Secondary | ICD-10-CM | POA: Diagnosis not present

## 2020-04-28 DIAGNOSIS — L603 Nail dystrophy: Secondary | ICD-10-CM | POA: Diagnosis not present

## 2020-04-28 DIAGNOSIS — L84 Corns and callosities: Secondary | ICD-10-CM | POA: Diagnosis not present

## 2020-05-17 ENCOUNTER — Telehealth: Payer: Self-pay | Admitting: General Practice

## 2020-05-20 ENCOUNTER — Other Ambulatory Visit: Payer: Self-pay | Admitting: Physician Assistant

## 2020-05-20 DIAGNOSIS — F410 Panic disorder [episodic paroxysmal anxiety] without agoraphobia: Secondary | ICD-10-CM

## 2020-06-05 ENCOUNTER — Other Ambulatory Visit: Payer: Self-pay | Admitting: Physician Assistant

## 2020-06-06 ENCOUNTER — Other Ambulatory Visit: Payer: Self-pay | Admitting: Physician Assistant

## 2020-06-06 DIAGNOSIS — I872 Venous insufficiency (chronic) (peripheral): Secondary | ICD-10-CM

## 2020-06-11 NOTE — Telephone Encounter (Signed)
Documentation only.

## 2020-07-01 ENCOUNTER — Other Ambulatory Visit: Payer: Self-pay | Admitting: Physician Assistant

## 2020-07-01 DIAGNOSIS — Z1231 Encounter for screening mammogram for malignant neoplasm of breast: Secondary | ICD-10-CM

## 2020-07-04 ENCOUNTER — Other Ambulatory Visit: Payer: Self-pay | Admitting: Physician Assistant

## 2020-07-04 DIAGNOSIS — E119 Type 2 diabetes mellitus without complications: Secondary | ICD-10-CM

## 2020-07-09 ENCOUNTER — Ambulatory Visit (INDEPENDENT_AMBULATORY_CARE_PROVIDER_SITE_OTHER): Payer: Medicare Other

## 2020-07-09 ENCOUNTER — Other Ambulatory Visit: Payer: Self-pay

## 2020-07-09 DIAGNOSIS — Z23 Encounter for immunization: Secondary | ICD-10-CM | POA: Diagnosis not present

## 2020-07-09 DIAGNOSIS — Z1231 Encounter for screening mammogram for malignant neoplasm of breast: Secondary | ICD-10-CM

## 2020-07-12 NOTE — Progress Notes (Signed)
Normal mammogram. Follow up in 1 year.

## 2020-07-16 DIAGNOSIS — H43811 Vitreous degeneration, right eye: Secondary | ICD-10-CM | POA: Diagnosis not present

## 2020-07-16 DIAGNOSIS — H353133 Nonexudative age-related macular degeneration, bilateral, advanced atrophic without subfoveal involvement: Secondary | ICD-10-CM | POA: Diagnosis not present

## 2020-07-16 DIAGNOSIS — H35033 Hypertensive retinopathy, bilateral: Secondary | ICD-10-CM | POA: Diagnosis not present

## 2020-07-16 DIAGNOSIS — H35372 Puckering of macula, left eye: Secondary | ICD-10-CM | POA: Diagnosis not present

## 2020-07-27 ENCOUNTER — Telehealth (INDEPENDENT_AMBULATORY_CARE_PROVIDER_SITE_OTHER): Payer: Medicare Other | Admitting: Physician Assistant

## 2020-07-27 ENCOUNTER — Encounter: Payer: Self-pay | Admitting: Physician Assistant

## 2020-07-27 VITALS — Temp 98.0°F | Ht 65.0 in | Wt 228.0 lb

## 2020-07-27 DIAGNOSIS — J0140 Acute pansinusitis, unspecified: Secondary | ICD-10-CM | POA: Diagnosis not present

## 2020-07-27 DIAGNOSIS — G4700 Insomnia, unspecified: Secondary | ICD-10-CM | POA: Diagnosis not present

## 2020-07-27 MED ORDER — AMOXICILLIN-POT CLAVULANATE 875-125 MG PO TABS: 1.0000 | ORAL_TABLET | Freq: Two times a day (BID) | ORAL | 0 refills | Status: DC

## 2020-07-27 MED ORDER — TRAZODONE HCL 50 MG PO TABS: 25.0000 mg | ORAL_TABLET | Freq: Every evening | ORAL | 2 refills | Status: DC | PRN

## 2020-07-27 MED ORDER — FLUTICASONE PROPIONATE 50 MCG/ACT NA SUSP: 2.0000 | Freq: Every day | NASAL | 1 refills | Status: DC

## 2020-07-27 NOTE — Progress Notes (Addendum)
Patient ID: Sarah Wright, female   DOB: [birth year], 75 y.o.   MRN: [id number] .[name] KitchenVirtual Visit via Telephone Note  I connected with Sarah Wright on 07/28/20 at  2:20 PM EDT by telephone and verified that I am speaking with the correct person using two identifiers.  Location: Patient: home Provider: clinic  .[name] KitchenParticipating in visit:  Patient: Sarah Wright Provider: [name] PA-C   I discussed the limitations, risks, security and privacy concerns of performing an evaluation and management service by telephone and the availability of in person appointments. I also discussed with the patient that there may be a patient responsible charge related to this service. The patient expressed understanding and agreed to proceed.   History of Present Illness: Pt is a 75 yo female with COPD who presents to the clinic with sinus pressure and drainage that started about 5 days ago. She does feel a little achy with headache. Her eyes are sensitive to light. She has some nasal congestion. She does have a dry cough. Taking tylenol and alka seltzer and excedrin migraine.    Active Ambulatory Problems    Diagnosis Date Noted  . Anxiety and depression 03/16/2014  . Hyperlipidemia 03/16/2014  . COLD (chronic obstructive lung disease) ([place]) 03/16/2014  . Insomnia 03/16/2014  . Esophageal reflux 03/16/2014  . Bilateral hand pain 03/16/2014  . DDD (degenerative disc disease), lumbar 03/16/2014  . Lumbar spondylosis 03/17/2014  . Aortic atherosclerosis ([place]) 03/17/2014  . Chronic venous insufficiency 03/27/2014  . H/O colonoscopy 04/02/2014  . Thyroid disease 04/02/2014  . Cataract 04/02/2014  . Type 2 diabetes mellitus ([place]) 04/02/2014  . Primary osteoarthritis of both knees 07/21/2014  . Morbid obesity ([place]) 10/07/2014  . Dizziness 01/04/2015  . Sensorineural hearing loss of both ears 01/26/2015  . Thyroid nodule 06/22/2015  . Memory loss 07/29/2015  . Psoriasis 09/23/2015  . Right shoulder injury  01/27/2016  . Balance problem 08/13/2016  . Left shoulder pain 08/13/2016  . Numbness around mouth 09/08/2016  . DDD (degenerative disc disease), cervical 12/29/2016  . Microalbuminuria 01/30/2017  . Chronic pain syndrome 05/06/2017  . Skin abrasion 08/01/2017  . Leukocytes in urine 08/01/2017  . Dysuria 08/01/2017  . Panic attack 08/01/2017  . RLS (restless legs syndrome) 08/12/2018  . COPD exacerbation ([place]) 08/14/2019  . Right ankle pain 01/20/2020   Resolved Ambulatory Problems    Diagnosis Date Noted  . No Resolved Ambulatory Problems   Past Medical History:  Diagnosis Date  . Diabetes mellitus without complication ([place])    Reviewed med, allergy, problem list.  Observations/Objective: No acute distress Normal breathing and no cough or wheezing  .[name] Today's Vitals   07/27/20 1333  Temp: 98 F (36.7 C)  TempSrc: Oral  Weight: 228 lb (103.4 kg)  Height: 5\' 5"  (1.651 m)   Body mass index is 37.94 kg/m.    Assessment and Plan: [name] KitchenMarland KitchenLuanne was seen today for sinus problem.  Diagnoses and all orders for this visit:  Acute non-recurrent pansinusitis -     amoxicillin-clavulanate (AUGMENTIN) 875-125 MG tablet; Take 1 tablet by mouth 2 (two) times daily. For 10 days. -     fluticasone (FLONASE) 50 MCG/ACT nasal spray; Place 2 sprays into both nostrils daily.  Insomnia, unspecified type -     traZODone (DESYREL) 50 MG tablet; Take 0.5-1 tablets (25-50 mg total) by mouth at bedtime as needed for sleep.   No signs of COPD exacerbation. Treated sinusitis with augmentin and flonase. Discussed symptomatic care.   Discussed sleep and good  hygiene. Start trazadone.     Follow Up Instructions:   spent 20 minutes on phone with patient.  I discussed the assessment and treatment plan with the patient. The patient was provided an opportunity to ask questions and all were answered. The patient agreed with the plan and demonstrated an understanding of the instructions.   The  patient was advised to call back or seek an in-person evaluation if the symptoms worsen or if the condition fails to improve as anticipated.  [name], PA-C

## 2020-07-27 NOTE — Progress Notes (Deleted)
Started Friday Body aches Headache Head pressure Eyes sensitive to light Congestion/runny nose Coughing/sneezing  Taking tylenol/excedrin migraine No cold meds or nasal sprays

## 2020-07-28 ENCOUNTER — Encounter: Payer: Self-pay | Admitting: Physician Assistant

## 2020-07-28 ENCOUNTER — Other Ambulatory Visit: Payer: Self-pay | Admitting: Physician Assistant

## 2020-07-28 DIAGNOSIS — J0140 Acute pansinusitis, unspecified: Secondary | ICD-10-CM

## 2020-07-28 MED ORDER — AZITHROMYCIN 250 MG PO TABS: ORAL_TABLET | ORAL | 0 refills | Status: DC

## 2020-07-28 NOTE — Telephone Encounter (Signed)
[facility] all [facility] in [place], all [facility] are out. The closest is [facility] and patient states that is too far to drive. [facility] because of [facility]. Can you send an alternative?

## 2020-07-28 NOTE — Telephone Encounter (Signed)
[facility] another [facility].

## 2020-07-28 NOTE — Progress Notes (Signed)
augmentin on backorder. Sent zpak.

## 2020-07-28 NOTE — Telephone Encounter (Signed)
Alternative or do you want me to send elsewhere?

## 2020-07-30 DIAGNOSIS — L603 Nail dystrophy: Secondary | ICD-10-CM | POA: Diagnosis not present

## 2020-07-30 DIAGNOSIS — E1151 Type 2 diabetes mellitus with diabetic peripheral angiopathy without gangrene: Secondary | ICD-10-CM | POA: Diagnosis not present

## 2020-07-30 DIAGNOSIS — M25571 Pain in right ankle and joints of right foot: Secondary | ICD-10-CM | POA: Diagnosis not present

## 2020-07-30 DIAGNOSIS — L84 Corns and callosities: Secondary | ICD-10-CM | POA: Diagnosis not present

## 2020-08-02 ENCOUNTER — Other Ambulatory Visit: Payer: Self-pay | Admitting: Physician Assistant

## 2020-08-02 DIAGNOSIS — F419 Anxiety disorder, unspecified: Secondary | ICD-10-CM

## 2020-08-02 DIAGNOSIS — F32A Depression, unspecified: Secondary | ICD-10-CM

## 2020-08-05 ENCOUNTER — Other Ambulatory Visit: Payer: Self-pay | Admitting: Physician Assistant

## 2020-08-05 DIAGNOSIS — E119 Type 2 diabetes mellitus without complications: Secondary | ICD-10-CM

## 2020-08-15 ENCOUNTER — Other Ambulatory Visit: Payer: Self-pay | Admitting: Physician Assistant

## 2020-08-15 DIAGNOSIS — G2581 Restless legs syndrome: Secondary | ICD-10-CM

## 2020-08-17 DIAGNOSIS — M7061 Trochanteric bursitis, right hip: Secondary | ICD-10-CM | POA: Diagnosis not present

## 2020-08-18 ENCOUNTER — Other Ambulatory Visit: Payer: Self-pay | Admitting: Physician Assistant

## 2020-08-18 DIAGNOSIS — J0140 Acute pansinusitis, unspecified: Secondary | ICD-10-CM

## 2020-08-18 DIAGNOSIS — G4700 Insomnia, unspecified: Secondary | ICD-10-CM

## 2020-08-20 ENCOUNTER — Ambulatory Visit (INDEPENDENT_AMBULATORY_CARE_PROVIDER_SITE_OTHER): Payer: Medicare Other | Admitting: Physician Assistant

## 2020-08-20 ENCOUNTER — Other Ambulatory Visit: Payer: Self-pay

## 2020-08-20 VITALS — BP 126/51 | HR 66 | Ht 65.0 in | Wt 232.0 lb

## 2020-08-20 DIAGNOSIS — R4583 Excessive crying of child, adolescent or adult: Secondary | ICD-10-CM

## 2020-08-20 DIAGNOSIS — I872 Venous insufficiency (chronic) (peripheral): Secondary | ICD-10-CM | POA: Diagnosis not present

## 2020-08-20 DIAGNOSIS — F332 Major depressive disorder, recurrent severe without psychotic features: Secondary | ICD-10-CM | POA: Diagnosis not present

## 2020-08-20 DIAGNOSIS — E119 Type 2 diabetes mellitus without complications: Secondary | ICD-10-CM

## 2020-08-20 LAB — POCT GLYCOSYLATED HEMOGLOBIN (HGB A1C): Hemoglobin A1C: 5.7 % — AB (ref 4.0–5.6)

## 2020-08-20 MED ORDER — OZEMPIC (1 MG/DOSE) 4 MG/3ML ~~LOC~~ SOPN: 1.0000 mg | PEN_INJECTOR | SUBCUTANEOUS | 2 refills | Status: DC

## 2020-08-20 MED ORDER — OLANZAPINE 7.5 MG PO TABS: 7.5000 mg | ORAL_TABLET | Freq: Every day | ORAL | 0 refills | Status: DC

## 2020-08-20 MED ORDER — DULOXETINE HCL 60 MG PO CPEP: 60.0000 mg | ORAL_CAPSULE | Freq: Two times a day (BID) | ORAL | 1 refills | Status: DC

## 2020-08-20 NOTE — Patient Instructions (Signed)
Increased zyprexa to 7.5mg  at night.

## 2020-08-20 NOTE — Progress Notes (Signed)
Subjective:    Patient ID: Sarah Wright, female    DOB: May 16, 1945, 75 y.o.   MRN: [id number]  HPI  Patient is a 75 year old obese female with aortic atherosclerosis, type 2 diabetes, COPD, chronic venous insufficiency, panic attacks, depression who presents to the clinic for 77-month follow-up.  Patient is not checking her sugars at home.  She is on Ozempic and doing well.  She denies any hypoglycemic events.  She denies any open sores or wounds.  Patient not having any chest pain, palpitations, headaches, vision changes  COPD-doing well. No concerns.   She does find herself being very tearful lately.  She does not have a lot of motivation to do anything.  She admits she does not have any friends.  She had to give up her car because she is not well enough to drive.  She does live with her daughter but her daughter is very busy.  The only thing that gives her joy is her granddaughter.  She denies any thoughts of harm to herself or others.  She denies any hallucinations.  She finds herself crying often throughout the day and even today in the exam room. She does not have any hobbies.   .. Active Ambulatory Problems    Diagnosis Date Noted  . Anxiety and depression 03/16/2014  . Hyperlipidemia 03/16/2014  . COLD (chronic obstructive lung disease) ([place]) 03/16/2014  . Insomnia 03/16/2014  . Esophageal reflux 03/16/2014  . Bilateral hand pain 03/16/2014  . DDD (degenerative disc disease), lumbar 03/16/2014  . Lumbar spondylosis 03/17/2014  . Aortic atherosclerosis ([place]) 03/17/2014  . Chronic venous insufficiency 03/27/2014  . H/O colonoscopy 04/02/2014  . Thyroid disease 04/02/2014  . Cataract 04/02/2014  . Type 2 diabetes mellitus ([place]) 04/02/2014  . Primary osteoarthritis of both knees 07/21/2014  . Morbid obesity ([place]) 10/07/2014  . Dizziness 01/04/2015  . Sensorineural hearing loss of both ears 01/26/2015  . Thyroid nodule 06/22/2015  . Memory loss 07/29/2015  . Psoriasis  09/23/2015  . Right shoulder injury 01/27/2016  . Balance problem 08/13/2016  . Left shoulder pain 08/13/2016  . Numbness around mouth 09/08/2016  . DDD (degenerative disc disease), cervical 12/29/2016  . Microalbuminuria 01/30/2017  . Chronic pain syndrome 05/06/2017  . Skin abrasion 08/01/2017  . Leukocytes in urine 08/01/2017  . Dysuria 08/01/2017  . Panic attack 08/01/2017  . RLS (restless legs syndrome) 08/12/2018  . COPD exacerbation ([place]) 08/14/2019  . Right ankle pain 01/20/2020  . Severe episode of recurrent major depressive disorder, without psychotic features ([place]) 08/20/2020  . Excessive crying 08/20/2020   Resolved Ambulatory Problems    Diagnosis Date Noted  . No Resolved Ambulatory Problems   Past Medical History:  Diagnosis Date  . Diabetes mellitus without complication ([place])       Review of Systems  All other systems reviewed and are negative.      Objective:   Physical Exam Vitals reviewed.  Constitutional:      Appearance: Normal appearance. She is obese.  Cardiovascular:     Rate and Rhythm: Normal rate and regular rhythm.     Pulses: Normal pulses.  Pulmonary:     Effort: Pulmonary effort is normal.     Breath sounds: Normal breath sounds.  Neurological:     General: No focal deficit present.     Mental Status: She is alert and oriented to person, place, and time.  Psychiatric:     Comments: tearful          .[name]  Depression screen [facility] 2/9 08/20/2020 10/28/2019 08/09/2018 05/02/2017 09/26/2016  Decreased Interest 2 1 1  0 2  Down, Depressed, Hopeless 3 0 1 1 2   PHQ - 2 Score 5 1 2 1 4   Altered sleeping 0 1 1 1 1   Tired, decreased energy 1 1 1 1 1   Change in appetite 0 1 0 0 1  Feeling bad or failure about yourself  1 1 0 2 1  Trouble concentrating 0 0 0 0 1  Moving slowly or fidgety/restless 0 0 1 0 0  Suicidal thoughts 0 0 0 0 1  PHQ-9 Score 7 5 5 5 10   Difficult doing work/chores Not difficult at all Not difficult at all Not difficult at  all Not difficult at all -  Some recent data might be hidden   .[name] GAD 7 : Generalized Anxiety Score 08/20/2020 10/28/2019 08/09/2018  Nervous, Anxious, on Edge 0 0 1  Control/stop worrying 1 0 1  Worry too much - different things 1 1 1   Trouble relaxing 0 0 1  Restless 0 0 1  Easily annoyed or irritable 1 1 1   Afraid - awful might happen 0 0 0  Total GAD 7 Score 3 2 6   Anxiety Difficulty Somewhat difficult Not difficult at all Not difficult at all     Assessment & Plan:  [name] KitchenMarland KitchenLuanne was seen today for follow-up.  Diagnoses and all orders for this visit:  Type 2 diabetes mellitus without complication, without long-term current use of insulin (HCC) -     POCT glycosylated hemoglobin (Hb A1C) -     COMPLETE METABOLIC PANEL WITH GFR  Chronic venous insufficiency  Severe episode of recurrent major depressive disorder, without psychotic features (HCC) -     OLANZapine (ZYPREXA) 7.5 MG tablet; Take 1 tablet (7.5 mg total) by mouth at bedtime. -     DULoxetine (CYMBALTA) 60 MG capsule; Take 1 capsule (60 mg total) by mouth 2 (two) times daily.  Excessive crying   A1C looks great.  Continue with same medications.  On statin.  BP to goal. Not on ace.  Eye exam UTD.  Foot exam UTD.  Covid, flu, pnuemonia vaccines UtD.    Excessive crying/MDD/panic attacks/chronic pain Declined counseling.  Continue cymbalta/wellbutrin Increased zyprexa to 7.5mg  daily.  Consider hobby/place to make friends.   Follow up in 4 weeks.

## 2020-08-21 LAB — COMPLETE METABOLIC PANEL WITH GFR
AG Ratio: 1.7 (calc) (ref 1.0–2.5)
ALT: 25 U/L (ref 6–29)
AST: 23 U/L (ref 10–35)
Albumin: 4.2 g/dL (ref 3.6–5.1)
Alkaline phosphatase (APISO): 155 U/L — ABNORMAL HIGH (ref 37–153)
BUN/Creatinine Ratio: 22 (calc) (ref 6–22)
BUN: 25 mg/dL (ref 7–25)
CO2: 27 mmol/L (ref 20–32)
Calcium: 9.5 mg/dL (ref 8.6–10.4)
Chloride: 106 mmol/L (ref 98–110)
Creat: 1.16 mg/dL — ABNORMAL HIGH (ref 0.60–0.93)
GFR, Est African American: 53 mL/min/{1.73_m2} — ABNORMAL LOW (ref >=60)
GFR, Est Non African American: 46 mL/min/{1.73_m2} — ABNORMAL LOW (ref >=60)
Globulin: 2.5 g/dL (calc) (ref 1.9–3.7)
Glucose, Bld: 91 mg/dL (ref 65–99)
Potassium: 4.7 mmol/L (ref 3.5–5.3)
Sodium: 141 mmol/L (ref 135–146)
Total Bilirubin: 0.3 mg/dL (ref 0.2–1.2)
Total Protein: 6.7 g/dL (ref 6.1–8.1)

## 2020-08-23 NOTE — Progress Notes (Signed)
Sarah Wright,   Kidney function decreased. GFR 46. Make sure not taking any anti-inflammatories such as goody's, advil, aleve. Make sure staying hydrated.  Recheck in 3 months.

## 2020-09-08 ENCOUNTER — Other Ambulatory Visit: Payer: Self-pay | Admitting: Physician Assistant

## 2020-09-08 DIAGNOSIS — J42 Unspecified chronic bronchitis: Secondary | ICD-10-CM

## 2020-09-15 ENCOUNTER — Other Ambulatory Visit: Payer: Self-pay | Admitting: Physician Assistant

## 2020-09-15 DIAGNOSIS — E063 Autoimmune thyroiditis: Secondary | ICD-10-CM | POA: Diagnosis not present

## 2020-09-15 DIAGNOSIS — D649 Anemia, unspecified: Secondary | ICD-10-CM | POA: Diagnosis not present

## 2020-09-15 DIAGNOSIS — D8481 Immunodeficiency due to conditions classified elsewhere: Secondary | ICD-10-CM | POA: Diagnosis not present

## 2020-09-15 DIAGNOSIS — J0140 Acute pansinusitis, unspecified: Secondary | ICD-10-CM

## 2020-09-15 DIAGNOSIS — J42 Unspecified chronic bronchitis: Secondary | ICD-10-CM | POA: Diagnosis not present

## 2020-09-15 DIAGNOSIS — Z831 Family history of other infectious and parasitic diseases: Secondary | ICD-10-CM | POA: Diagnosis not present

## 2020-09-20 ENCOUNTER — Other Ambulatory Visit: Payer: Self-pay | Admitting: Physician Assistant

## 2020-09-20 DIAGNOSIS — G2581 Restless legs syndrome: Secondary | ICD-10-CM

## 2020-10-14 ENCOUNTER — Other Ambulatory Visit: Payer: Self-pay | Admitting: Physician Assistant

## 2020-10-14 DIAGNOSIS — F32A Depression, unspecified: Secondary | ICD-10-CM

## 2020-10-14 DIAGNOSIS — R110 Nausea: Secondary | ICD-10-CM

## 2020-10-14 DIAGNOSIS — F419 Anxiety disorder, unspecified: Secondary | ICD-10-CM

## 2020-10-16 ENCOUNTER — Other Ambulatory Visit: Payer: Self-pay | Admitting: Physician Assistant

## 2020-10-16 DIAGNOSIS — J0140 Acute pansinusitis, unspecified: Secondary | ICD-10-CM

## 2020-10-18 DIAGNOSIS — M7061 Trochanteric bursitis, right hip: Secondary | ICD-10-CM | POA: Diagnosis not present

## 2020-10-18 DIAGNOSIS — M5136 Other intervertebral disc degeneration, lumbar region: Secondary | ICD-10-CM | POA: Diagnosis not present

## 2020-10-18 DIAGNOSIS — Z79899 Other long term (current) drug therapy: Secondary | ICD-10-CM | POA: Diagnosis not present

## 2020-10-18 DIAGNOSIS — M533 Sacrococcygeal disorders, not elsewhere classified: Secondary | ICD-10-CM | POA: Diagnosis not present

## 2020-10-18 DIAGNOSIS — M48061 Spinal stenosis, lumbar region without neurogenic claudication: Secondary | ICD-10-CM | POA: Diagnosis not present

## 2020-10-18 DIAGNOSIS — M47816 Spondylosis without myelopathy or radiculopathy, lumbar region: Secondary | ICD-10-CM | POA: Diagnosis not present

## 2020-10-18 DIAGNOSIS — Z5181 Encounter for therapeutic drug level monitoring: Secondary | ICD-10-CM | POA: Diagnosis not present

## 2020-10-18 DIAGNOSIS — G894 Chronic pain syndrome: Secondary | ICD-10-CM | POA: Diagnosis not present

## 2020-10-20 ENCOUNTER — Other Ambulatory Visit: Payer: Self-pay | Admitting: Physician Assistant

## 2020-10-20 DIAGNOSIS — G4700 Insomnia, unspecified: Secondary | ICD-10-CM

## 2020-11-01 ENCOUNTER — Encounter: Payer: Self-pay | Admitting: Physician Assistant

## 2020-11-02 ENCOUNTER — Telehealth (INDEPENDENT_AMBULATORY_CARE_PROVIDER_SITE_OTHER): Payer: Medicare Other | Admitting: Physician Assistant

## 2020-11-02 VITALS — Temp 98.5°F | Ht 65.0 in | Wt 232.0 lb

## 2020-11-02 DIAGNOSIS — R059 Cough, unspecified: Secondary | ICD-10-CM | POA: Diagnosis not present

## 2020-11-02 DIAGNOSIS — U071 COVID-19: Secondary | ICD-10-CM | POA: Diagnosis not present

## 2020-11-02 MED ORDER — HYDROCOD POLST-CPM POLST ER 10-8 MG/5ML PO SUER: 5.0000 mL | Freq: Two times a day (BID) | ORAL | 0 refills | Status: DC | PRN

## 2020-11-02 MED ORDER — BENZONATATE 200 MG PO CAPS: 200.0000 mg | ORAL_CAPSULE | Freq: Two times a day (BID) | ORAL | 0 refills | Status: DC | PRN

## 2020-11-02 MED ORDER — MOLNUPIRAVIR EUA 200MG CAPSULE: 4.0000 | ORAL_CAPSULE | Freq: Two times a day (BID) | ORAL | 0 refills | Status: AC

## 2020-11-02 NOTE — Progress Notes (Signed)
Patient ID: Sarah Wright, female   DOB: [birth year], 75 y.o.   MRN: [id number] .[name] KitchenVirtual Visit via Video Note  I connected with Sarah Wright on 11/03/20 at 11:10 AM EDT by a video enabled telemedicine application and verified that I am speaking with the correct person using two identifiers.  Location: Patient: home Provider: clinic  .[name] KitchenParticipating in visit:  Patient: [place] Provider: [name] PA-C   I discussed the limitations of evaluation and management by telemedicine and the availability of in person appointments. The patient expressed understanding and agreed to proceed.  History of Present Illness: Pt is a 75 yo obese female with T2DM, COPD, hypothyroidism who calls in to the clinic to discuss 3 days of covid symptoms and positive test on Sunday. She is having body aches, fatigue, congestion, headache, chills, cough. Her cough is what worries her with her COPD history. She is taking tylenol cold sinus severe and ibuprofen. She is not getting much relief. She has her duoneb that she is using more frequently. She has covid vaccine x2.   .. Active Ambulatory Problems    Diagnosis Date Noted   Anxiety and depression 03/16/2014   Hyperlipidemia 03/16/2014   COLD (chronic obstructive lung disease) ([place]) 03/16/2014   Insomnia 03/16/2014   Esophageal reflux 03/16/2014   Bilateral hand pain 03/16/2014   DDD (degenerative disc disease), lumbar 03/16/2014   Lumbar spondylosis 03/17/2014   Aortic atherosclerosis ([place]) 03/17/2014   Chronic venous insufficiency 03/27/2014   H/O colonoscopy 04/02/2014   Thyroid disease 04/02/2014   Cataract 04/02/2014   Type 2 diabetes mellitus ([place]) 04/02/2014   Primary osteoarthritis of both knees 07/21/2014   Morbid obesity ([place]) 10/07/2014   Dizziness 01/04/2015   Sensorineural hearing loss of both ears 01/26/2015   Thyroid nodule 06/22/2015   Memory loss 07/29/2015   Psoriasis 09/23/2015   Right shoulder injury 01/27/2016   Balance problem  08/13/2016   Left shoulder pain 08/13/2016   Numbness around mouth 09/08/2016   DDD (degenerative disc disease), cervical 12/29/2016   Microalbuminuria 01/30/2017   Chronic pain syndrome 05/06/2017   Skin abrasion 08/01/2017   Leukocytes in urine 08/01/2017   Dysuria 08/01/2017   Panic attack 08/01/2017   RLS (restless legs syndrome) 08/12/2018   COPD exacerbation ([place]) 08/14/2019   Right ankle pain 01/20/2020   Severe episode of recurrent major depressive disorder, without psychotic features ([place]) 08/20/2020   Excessive crying 08/20/2020   Resolved Ambulatory Problems    Diagnosis Date Noted   No Resolved Ambulatory Problems   Past Medical History:  Diagnosis Date   Diabetes mellitus without complication ([place])     Observations/Objective: No acute distress Pale appearance Normal mood No labored breathing Productive cough  .[name] Today's Vitals   11/02/20 1112  Temp: 98.5 F (36.9 C)  TempSrc: Oral  Weight: 232 lb (105.2 kg)  Height: 5\' 5"  (1.651 m)   Body mass index is 38.61 kg/m.    Assessment and Plan: [name] KitchenMarland KitchenLuanne was seen today for covid positive.  Diagnoses and all orders for this visit:  COVID-19 virus infection -     molnupiravir EUA 200 mg CAPS; Take 4 capsules (800 mg total) by mouth 2 (two) times daily for 5 days. -     chlorpheniramine-HYDROcodone ([place]) 10-8 MG/5ML SUER; Take 5 mLs by mouth every 12 (twelve) hours as needed. -     benzonatate (TESSALON) 200 MG capsule; Take 1 capsule (200 mg total) by mouth 2 (two) times daily as needed for cough.  Cough -  chlorpheniramine-HYDROcodone ([place]) 10-8 MG/5ML SUER; Take 5 mLs by mouth every 12 (twelve) hours as needed. -     benzonatate (TESSALON) 200 MG capsule; Take 1 capsule (200 mg total) by mouth 2 (two) times daily as needed for cough.  Pt is covid positive.  Quarantine for 5 days then ok to go in public with mask for another 5 days.  Rest and hydrate. Discussed symptomatic care.   Encouraged good deep breathing. Monitor O2 stats to stay above 92 percent.  Day 3 of symptoms. Started molnuprivir as antiviral.  ..Vitamin D3 5000 IU (125 mcg) daily Vitamin C 500 mg twice daily Zinc 50 to 75 mg daily Tessalon for cough during day and tussionex at night.  Hold on steroid at this time because we don't want to start too soon with covid. If not improving or breathing worsening will add dexamethasone.    Follow Up Instructions:    I discussed the assessment and treatment plan with the patient. The patient was provided an opportunity to ask questions and all were answered. The patient agreed with the plan and demonstrated an understanding of the instructions.   The patient was advised to call back or seek an in-person evaluation if the symptoms worsen or if the condition fails to improve as anticipated.   [name], PA-C

## 2020-11-02 NOTE — Progress Notes (Signed)
Started Sunday Tested Sunday at home - + Covid Body aches, congestion head/chest, headache, chills, cough   Has taken Ibuprofen/excedrin for symptoms

## 2020-11-03 ENCOUNTER — Encounter: Payer: Self-pay | Admitting: Physician Assistant

## 2020-11-05 ENCOUNTER — Other Ambulatory Visit: Payer: Self-pay | Admitting: Physician Assistant

## 2020-11-05 DIAGNOSIS — E119 Type 2 diabetes mellitus without complications: Secondary | ICD-10-CM

## 2020-11-08 ENCOUNTER — Telehealth (INDEPENDENT_AMBULATORY_CARE_PROVIDER_SITE_OTHER): Payer: Medicare Other | Admitting: Physician Assistant

## 2020-11-08 VITALS — HR 74 | Temp 98.1°F | Ht 65.0 in | Wt 232.0 lb

## 2020-11-08 DIAGNOSIS — J440 Chronic obstructive pulmonary disease with acute lower respiratory infection: Secondary | ICD-10-CM | POA: Diagnosis not present

## 2020-11-08 DIAGNOSIS — U071 COVID-19: Secondary | ICD-10-CM | POA: Diagnosis not present

## 2020-11-08 MED ORDER — PREDNISONE 20 MG PO TABS: 20.0000 mg | ORAL_TABLET | Freq: Two times a day (BID) | ORAL | 0 refills | Status: AC

## 2020-11-08 MED ORDER — GUAIFENESIN ER 600 MG PO TB12: 600.0000 mg | ORAL_TABLET | Freq: Two times a day (BID) | ORAL | Status: DC

## 2020-11-08 MED ORDER — DOXYCYCLINE HYCLATE 100 MG PO TABS: 100.0000 mg | ORAL_TABLET | Freq: Two times a day (BID) | ORAL | 0 refills | Status: AC

## 2020-11-08 NOTE — Progress Notes (Signed)
Feels like it's moved to her chest, having "rattling" and cough, using tessalon, no other new symptoms  Was seen on 11/02/2020 Tested positive for Covid 10/31/2020 From last note: Pt is covid positive. Quarantine for 5 days then ok to go in public with mask for another 5 days. Rest and hydrate. Discussed symptomatic care. Encouraged good deep breathing. Monitor O2 stats to stay above 92 percent. Day 3 of symptoms. Started molnuprivir as antiviral. Vitamin D3 5000 IU (125 mcg) daily Vitamin C 500 mg twice daily Zinc 50 to 75 mg daily Tessalon for cough during day and tussionex at night. Hold on steroid at this time because we don't want to start too soon with covid. If not improving or breathing worsening will add dexamethasone.

## 2020-11-08 NOTE — Progress Notes (Signed)
Telemedicine Visit via  Video & Audio (App used: [facility])   I connected with [name] on 11/08/20 at 10:26 AM  by phone or  telemedicine application as noted above  I verified that I am speaking with or regarding  the correct patient using two identifiers.  Participants: Myself, Nelson Chimes PA-C Patient: Sarah Wright   Patient is at home in [facility]  I am in office at [facility]    I discussed the limitations of evaluation and management  by telemedicine and the availability of in person appointments.  The participant(s) above expressed understanding and  agreed to proceed with this appointment via telemedicine.       History of Present Illness: Sarah Wright is a 75 y.o. female with PMH of COPD who would like to discuss COVID-19 infection   Symptom onset 7.09.22 - she is on day 9 of illness Tested positive on 7.10.22 Evaluated by PCP via telehealth on 7.12.22 - prescribed Tessalon, Tussionex, and Molnupiravir She states symptoms have migrated into her chest Reports a dry cough that she is unable to expectorate mucus and feels congestion and hears a rattle in her chest She is having DOE and treating with nebulizer as needed - has only used a couple of times She tried taking antiviral capsules Molnupiravir (extreme nausea, unable to eat, and headache) Denies fever, chills, nightsweats, blood-tinged sputum, chest pain, lethargy/confusion, dizziness/lightheadedness, syncope Remote hx of PNA 20 years ago, but has needed antibiotics and steroids for COPD exacerbations She is fully immunized for COVD-19 - Moderna booster 06/2020    Observations/Objective: Pulse 74   Temp 98.1 F (36.7 C) (Oral)   Ht 5\' 5"  (1.651 m)   Wt 232 lb (105.2 kg)   SpO2 96%   BMI 38.61 kg/m  BP Readings from Last 3 Encounters:  08/20/20 (!) 126/51  03/16/20 (!) 126/52  01/28/20 (!) 122/51   Exam limited by telehealth Gen: alert, appears fatigued, no acute distress Pulm:  normal work of breathing, normal phonation Psych: cooperative, normal thought content, normal speech  Lab and Radiology Results No results found for this or any previous visit (from the past 72 hour(s)). No results found.     Assessment and Plan: 75 y.o. female with The primary encounter diagnosis was Chronic obstructive pulmonary disease with acute lower respiratory infection ([place]). A diagnosis of COVID-19 virus infection was also pertinent to this visit.   PDMP not reviewed this encounter. No orders of the defined types were placed in this encounter.  Meds ordered this encounter  Medications   predniSONE (DELTASONE) 20 MG tablet    Sig: Take 1 tablet (20 mg total) by mouth 2 (two) times daily with a meal for 10 days.    Dispense:  20 tablet    Refill:  0    Order Specific Question:   Supervising Provider    Answer:   Emeterio Reeve [[id number]]   doxycycline (VIBRA-TABS) 100 MG tablet    Sig: Take 1 tablet (100 mg total) by mouth 2 (two) times daily for 7 days.    Dispense:  14 tablet    Refill:  0    Order Specific Question:   Supervising Provider    Answer:   Emeterio Reeve [[id number]]   guaiFENesin (MUCINEX) 600 MG 12 hr tablet    Sig: Take 1-2 tablets (600-1,200 mg total) by mouth 2 (two) times daily.    Order Specific Question:   Supervising Provider    Answer:   Emeterio Reeve [[id number]]  Patient Instructions  Schedule your nebulizer treatments 2-3 times per day for the next 3 days Start Prednisone today - can take 1 tab with breakfast and 1 tab with lunch or both tabs together with daily meal Start OTC Mucinex today - take with full 8 ounces of water - this should loosen the chest congestion and make it easier to cough up Avoid Tussionex since it can slow your breathing or cause you to stop breathing in your sleep - only take if it has been more than 8 hrs since your last dose of Oxycycodone and do not combine with other sedatives Wait to start Doxycycline if  you are not improving in the next 48 hrs or you show signs of bacterial infection such as double sickening, fever/chills, nausea/vomiting   Instructions sent via [facility].   Follow Up Instructions: No follow-ups on file.    I discussed the assessment and treatment plan with the patient. The patient was provided an opportunity to ask questions and all were answered. The patient agreed with the plan and demonstrated an understanding of the instructions.   The patient was advised to call back or seek an in-person evaluation if any new concerns, if symptoms worsen or if the condition fails to improve as anticipated.  21 minutes of non-face-to-face time was provided during this encounter.      . . . . . . . . . . . . . [name]                   Historical information moved to improve visibility of documentation.  Past Medical History:  Diagnosis Date   Diabetes mellitus without complication ([place])    Hyperlipidemia    Thyroid disease    Past Surgical History:  Procedure Laterality Date   ABDOMINAL HYSTERECTOMY     CARPAL TUNNEL RELEASE     both wrists   CATARACT EXTRACTION, BILATERAL     CERVICAL FUSION     GALLBLADDER SURGERY     right foot surgery     THYROIDECTOMY, PARTIAL     right side removed   Social History   Tobacco Use   Smoking status: Former    Types: Cigarettes    Quit date: 02/12/2014    Years since quitting: 6.7   Smokeless tobacco: Never  Substance Use Topics   Alcohol use: No    Alcohol/week: 0.0 standard drinks   family history includes Depression in her mother; Diabetes in her father; Heart attack in her father; Hypertension in an other family member; Pancreatic cancer in an other family member; Uterine cancer in an other family member.  Medications: Current Outpatient Medications  Medication Sig Dispense Refill   albuterol (PROVENTIL) (2.5 MG/3ML) 0.083% nebulizer solution Take 2.5 mg by nebulization every 6 (six) hours as  needed for wheezing or shortness of breath.     albuterol (VENTOLIN HFA) 108 (90 Base) MCG/ACT inhaler TAKE 2 PUFFS BY MOUTH EVERY 6 HOURS AS NEEDED FOR WHEEZE OR SHORTNESS OF BREATH 17 each 1   aspirin 81 MG tablet Take 81 mg by mouth daily.     atorvastatin (LIPITOR) 20 MG tablet Take 1 tablet (20 mg total) by mouth daily. 90 tablet 3   benzonatate (TESSALON) 200 MG capsule Take 1 capsule (200 mg total) by mouth 2 (two) times daily as needed for cough. 20 capsule 0   buPROPion (WELLBUTRIN XL) 150 MG 24 hr tablet TAKE 2 TABLETS (300 MG TOTAL) BY MOUTH DAILY. 180 tablet 1  chlorpheniramine-HYDROcodone (TUSSIONEX) 10-8 MG/5ML SUER Take 5 mLs by mouth every 12 (twelve) hours as needed. 70 mL 0   donepezil (ARICEPT) 10 MG tablet TAKE 1 TABLET BY MOUTH EVERY DAY. 90 tablet 3   doxycycline (VIBRA-TABS) 100 MG tablet Take 1 tablet (100 mg total) by mouth 2 (two) times daily for 7 days. 14 tablet 0   DULoxetine (CYMBALTA) 60 MG capsule Take 1 capsule (60 mg total) by mouth 2 (two) times daily. 180 capsule 1   furosemide (LASIX) 20 MG tablet Take daily as needed for lower extremity swelling. Needs appointment for another refill 90 tablet 0   guaiFENesin (MUCINEX) 600 MG 12 hr tablet Take 1-2 tablets (600-1,200 mg total) by mouth 2 (two) times daily.     hydrOXYzine (ATARAX/VISTARIL) 25 MG tablet TAKE 1 TABLET BY MOUTH AS NEEDED UP TO TWICE A DAY 180 tablet 1   ipratropium-albuterol (DUONEB) 0.5-2.5 (3) MG/3ML SOLN Take 3 mLs by nebulization every 4 (four) hours as needed. 360 mL 1   meloxicam (MOBIC) 15 MG tablet Take 1 tablet (15 mg total) by mouth daily. 90 tablet 3   montelukast (SINGULAIR) 10 MG tablet TAKE 1 TABLET BY MOUTH EVERYDAY AT BEDTIME 90 tablet 4   OLANZapine (ZYPREXA) 5 MG tablet TAKE 1 TABLET (5 MG TOTAL) BY MOUTH AT BEDTIME 90 tablet 1   OLANZapine (ZYPREXA) 7.5 MG tablet Take 1 tablet (7.5 mg total) by mouth at bedtime. 90 tablet 0   ondansetron (ZOFRAN) 8 MG tablet TAKE 1 TABLET (8 MG  TOTAL) BY MOUTH EVERY 8 (EIGHT) HOURS AS NEEDED FOR NAUSEA OR VOMITING. 20 tablet 2   Oxycodone HCl 10 MG TABS TAKE 1 TABELT BY MOUTH EVERY 6 HOURS FOR 30 DAYS  0   OZEMPIC, 1 MG/DOSE, 4 MG/3ML SOPN INJECT 1MG  INTO THE SKIN ONCE A WEEK 3 mL 2   predniSONE (DELTASONE) 20 MG tablet Take 1 tablet (20 mg total) by mouth 2 (two) times daily with a meal for 10 days. 20 tablet 0   rOPINIRole (REQUIP) 0.25 MG tablet TAKE 3 TABLETS BY MOUTH AS NEEDED NIGHTLY FOR RESTLESS LEGS 270 tablet 1   topiramate (TOPAMAX) 50 MG tablet TAKE 1 TABLET BY MOUTH TWICE A DAY 180 tablet 1   traZODone (DESYREL) 50 MG tablet TAKE 0.5-1 TABLETS BY MOUTH AT BEDTIME AS NEEDED FOR SLEEP. 30 tablet 2   TRELEGY ELLIPTA 100-62.5-25 MCG/INH AEPB INHALE 1 PUFF BY MOUTH EVERY DAY 60 each 3   fluticasone (FLONASE) 50 MCG/ACT nasal spray SPRAY 2 SPRAYS INTO EACH NOSTRIL EVERY DAY 16 mL 1   No current facility-administered medications for this visit.   Allergies  Allergen Reactions   Molnupiravir Nausea Only    headache   Sulfa Antibiotics     Patient had reaction as a child; does not know what type of reaction   Vistaril [Hydroxyzine Hcl]     Per patient she had increased itching with medication.      If phone visit, billing and coding can please add appropriate modifier if needed

## 2020-11-08 NOTE — Patient Instructions (Signed)
Schedule your nebulizer treatments 2-3 times per day for the next 3 days Start Prednisone today - can take 1 tab with breakfast and 1 tab with lunch or both tabs together with daily meal Start OTC Mucinex today - take with full 8 ounces of water - this should loosen the chest congestion and make it easier to cough up Avoid Tussionex since it can slow your breathing or cause you to stop breathing in your sleep - only take if it has been more than 8 hrs since your last dose of Oxycycodone and do not combine with other sedatives Wait to start Doxycycline if you are not improving in the next 48 hrs or you show signs of bacterial infection such as double sickening, fever/chills, nausea/vomiting

## 2020-11-11 ENCOUNTER — Other Ambulatory Visit: Payer: Self-pay | Admitting: Physician Assistant

## 2020-11-11 DIAGNOSIS — J0140 Acute pansinusitis, unspecified: Secondary | ICD-10-CM

## 2020-11-14 ENCOUNTER — Other Ambulatory Visit: Payer: Self-pay | Admitting: Physician Assistant

## 2020-11-14 DIAGNOSIS — F332 Major depressive disorder, recurrent severe without psychotic features: Secondary | ICD-10-CM

## 2020-11-19 ENCOUNTER — Other Ambulatory Visit: Payer: Self-pay | Admitting: Physician Assistant

## 2020-11-19 DIAGNOSIS — G4700 Insomnia, unspecified: Secondary | ICD-10-CM

## 2020-11-19 DIAGNOSIS — E1151 Type 2 diabetes mellitus with diabetic peripheral angiopathy without gangrene: Secondary | ICD-10-CM | POA: Diagnosis not present

## 2020-11-19 DIAGNOSIS — M21611 Bunion of right foot: Secondary | ICD-10-CM | POA: Diagnosis not present

## 2020-11-19 DIAGNOSIS — L603 Nail dystrophy: Secondary | ICD-10-CM | POA: Diagnosis not present

## 2020-11-19 DIAGNOSIS — L84 Corns and callosities: Secondary | ICD-10-CM | POA: Diagnosis not present

## 2020-11-25 ENCOUNTER — Telehealth: Payer: Self-pay

## 2020-11-25 NOTE — Chronic Care Management (AMB) (Signed)
  Chronic Care Management   Outreach Note  11/25/2020 Name: Sarah Wright MRN: [id number] DOB: 07/01/1945  Sarah Wright is a 75 y.o. year old female who is a primary care patient of Donella Stade, PA-C. I reached out to Sarah Wright by phone today in response to a referral sent by Ms. Joan Flores, PA-C     An unsuccessful telephone outreach was attempted today. The patient was referred to the case management team for assistance with care management and care coordination.   Follow Up Plan: A HIPAA compliant phone message was left for the patient providing contact information and requesting a return call.  The care management team will reach out to the patient again over the next 7 days.  If patient returns call to provider office, please advise to call [facility] at [place] [place], [place], [place], [place] [address] Direct Dial: [phone] [email or web address]

## 2020-11-26 NOTE — Chronic Care Management (AMB) (Signed)
  Chronic Care Management   Note  11/26/2020 Name: Sarah Wright MRN: [id number] DOB: Jan 28, 1946  Sarah Wright is a 75 y.o. year old female who is a primary care patient of Donella Stade, PA-C. I reached out to [name] by phone today in response to a referral sent by Sarah Wright PCP, Donella Stade, PA-C      Sarah Wright was given information about Chronic Care Management services today including:  CCM service includes personalized support from designated clinical staff supervised by her physician, including individualized plan of care and coordination with other care providers 24/7 contact phone numbers for assistance for urgent and routine care needs. Service will only be billed when office clinical staff spend 20 minutes or more in a month to coordinate care. Only one practitioner may furnish and bill the service in a calendar month. The patient may stop CCM services at any time (effective at the end of the month) by phone call to the office staff. The patient will be responsible for cost sharing (co-pay) of up to 20% of the service fee (after annual deductible is met).  Patient did not agree to enrollment in care management services and does not wish to consider at this time.  Follow up plan: Patient declines engagement by the care management team. Appropriate care team members and provider have been notified via electronic communication.   Noreene Larsson, [address], [place], [place] [address] Direct Dial: [phone] [email or web address]: [email or web address]

## 2020-12-03 ENCOUNTER — Other Ambulatory Visit: Payer: Self-pay | Admitting: Physician Assistant

## 2020-12-03 DIAGNOSIS — J0140 Acute pansinusitis, unspecified: Secondary | ICD-10-CM

## 2020-12-03 DIAGNOSIS — R413 Other amnesia: Secondary | ICD-10-CM

## 2020-12-20 DIAGNOSIS — G894 Chronic pain syndrome: Secondary | ICD-10-CM | POA: Diagnosis not present

## 2020-12-20 DIAGNOSIS — M7061 Trochanteric bursitis, right hip: Secondary | ICD-10-CM | POA: Diagnosis not present

## 2020-12-20 DIAGNOSIS — M48061 Spinal stenosis, lumbar region without neurogenic claudication: Secondary | ICD-10-CM | POA: Diagnosis not present

## 2020-12-20 DIAGNOSIS — M533 Sacrococcygeal disorders, not elsewhere classified: Secondary | ICD-10-CM | POA: Diagnosis not present

## 2020-12-20 DIAGNOSIS — M47816 Spondylosis without myelopathy or radiculopathy, lumbar region: Secondary | ICD-10-CM | POA: Diagnosis not present

## 2020-12-20 DIAGNOSIS — M5136 Other intervertebral disc degeneration, lumbar region: Secondary | ICD-10-CM | POA: Diagnosis not present

## 2020-12-22 ENCOUNTER — Telehealth: Payer: Self-pay | Admitting: Physician Assistant

## 2020-12-22 NOTE — Telephone Encounter (Signed)
Tried calling patient to schedule Medicare Annual Wellness Visit (AWV) either virtually or in office.  No answer   Last AWV 09/23/14  please schedule at anytime with health coach  This should be a 40 minute visit.

## 2020-12-28 ENCOUNTER — Other Ambulatory Visit: Payer: Self-pay | Admitting: Physician Assistant

## 2020-12-28 NOTE — Telephone Encounter (Signed)
Please advise 

## 2021-01-05 ENCOUNTER — Other Ambulatory Visit: Payer: Self-pay | Admitting: Physician Assistant

## 2021-01-05 DIAGNOSIS — J0140 Acute pansinusitis, unspecified: Secondary | ICD-10-CM

## 2021-01-10 ENCOUNTER — Other Ambulatory Visit: Payer: Self-pay | Admitting: Physician Assistant

## 2021-01-10 DIAGNOSIS — E7849 Other hyperlipidemia: Secondary | ICD-10-CM

## 2021-01-14 ENCOUNTER — Other Ambulatory Visit: Payer: Self-pay | Admitting: Physician Assistant

## 2021-01-14 DIAGNOSIS — F410 Panic disorder [episodic paroxysmal anxiety] without agoraphobia: Secondary | ICD-10-CM

## 2021-01-15 ENCOUNTER — Other Ambulatory Visit: Payer: Self-pay | Admitting: Physician Assistant

## 2021-01-15 DIAGNOSIS — M25512 Pain in left shoulder: Secondary | ICD-10-CM

## 2021-01-24 ENCOUNTER — Other Ambulatory Visit: Payer: Self-pay | Admitting: Physician Assistant

## 2021-01-24 DIAGNOSIS — G4700 Insomnia, unspecified: Secondary | ICD-10-CM

## 2021-01-25 ENCOUNTER — Other Ambulatory Visit: Payer: Self-pay | Admitting: Physician Assistant

## 2021-01-25 DIAGNOSIS — F332 Major depressive disorder, recurrent severe without psychotic features: Secondary | ICD-10-CM

## 2021-01-28 ENCOUNTER — Other Ambulatory Visit: Payer: Self-pay | Admitting: Physician Assistant

## 2021-01-28 DIAGNOSIS — J0140 Acute pansinusitis, unspecified: Secondary | ICD-10-CM

## 2021-02-13 ENCOUNTER — Other Ambulatory Visit: Payer: Self-pay | Admitting: Physician Assistant

## 2021-02-13 DIAGNOSIS — F332 Major depressive disorder, recurrent severe without psychotic features: Secondary | ICD-10-CM

## 2021-02-18 ENCOUNTER — Other Ambulatory Visit: Payer: Self-pay | Admitting: Physician Assistant

## 2021-02-18 DIAGNOSIS — E1151 Type 2 diabetes mellitus with diabetic peripheral angiopathy without gangrene: Secondary | ICD-10-CM | POA: Diagnosis not present

## 2021-02-18 DIAGNOSIS — L84 Corns and callosities: Secondary | ICD-10-CM | POA: Diagnosis not present

## 2021-02-18 DIAGNOSIS — L603 Nail dystrophy: Secondary | ICD-10-CM | POA: Diagnosis not present

## 2021-02-18 DIAGNOSIS — G4700 Insomnia, unspecified: Secondary | ICD-10-CM

## 2021-02-18 DIAGNOSIS — M79672 Pain in left foot: Secondary | ICD-10-CM | POA: Diagnosis not present

## 2021-02-22 ENCOUNTER — Other Ambulatory Visit: Payer: Self-pay | Admitting: Physician Assistant

## 2021-02-22 DIAGNOSIS — G4700 Insomnia, unspecified: Secondary | ICD-10-CM

## 2021-02-22 DIAGNOSIS — Z20822 Contact with and (suspected) exposure to covid-19: Secondary | ICD-10-CM | POA: Diagnosis not present

## 2021-02-22 NOTE — Telephone Encounter (Signed)
LVM for patient to call back to get this appt scheduled. AM

## 2021-02-22 NOTE — Telephone Encounter (Signed)
2 weeks of Trazodone sent to pharmacy Patient needs appt

## 2021-02-28 DIAGNOSIS — M4604 Spinal enthesopathy, thoracic region: Secondary | ICD-10-CM | POA: Diagnosis not present

## 2021-02-28 DIAGNOSIS — M79661 Pain in right lower leg: Secondary | ICD-10-CM | POA: Diagnosis not present

## 2021-02-28 DIAGNOSIS — M47816 Spondylosis without myelopathy or radiculopathy, lumbar region: Secondary | ICD-10-CM | POA: Diagnosis not present

## 2021-02-28 DIAGNOSIS — Z20822 Contact with and (suspected) exposure to covid-19: Secondary | ICD-10-CM | POA: Diagnosis not present

## 2021-02-28 DIAGNOSIS — M47814 Spondylosis without myelopathy or radiculopathy, thoracic region: Secondary | ICD-10-CM | POA: Diagnosis not present

## 2021-02-28 DIAGNOSIS — M48061 Spinal stenosis, lumbar region without neurogenic claudication: Secondary | ICD-10-CM | POA: Diagnosis not present

## 2021-02-28 DIAGNOSIS — M5136 Other intervertebral disc degeneration, lumbar region: Secondary | ICD-10-CM | POA: Diagnosis not present

## 2021-02-28 DIAGNOSIS — G894 Chronic pain syndrome: Secondary | ICD-10-CM | POA: Diagnosis not present

## 2021-02-28 DIAGNOSIS — M546 Pain in thoracic spine: Secondary | ICD-10-CM | POA: Diagnosis not present

## 2021-02-28 DIAGNOSIS — M5134 Other intervertebral disc degeneration, thoracic region: Secondary | ICD-10-CM | POA: Diagnosis not present

## 2021-02-28 DIAGNOSIS — M79662 Pain in left lower leg: Secondary | ICD-10-CM | POA: Diagnosis not present

## 2021-02-28 DIAGNOSIS — M533 Sacrococcygeal disorders, not elsewhere classified: Secondary | ICD-10-CM | POA: Diagnosis not present

## 2021-03-01 ENCOUNTER — Other Ambulatory Visit: Payer: Self-pay | Admitting: Physician Assistant

## 2021-03-01 DIAGNOSIS — G2581 Restless legs syndrome: Secondary | ICD-10-CM

## 2021-03-01 DIAGNOSIS — R110 Nausea: Secondary | ICD-10-CM

## 2021-03-02 ENCOUNTER — Encounter: Payer: Self-pay | Admitting: Physician Assistant

## 2021-03-02 ENCOUNTER — Ambulatory Visit (INDEPENDENT_AMBULATORY_CARE_PROVIDER_SITE_OTHER): Payer: Medicare Other | Admitting: Physician Assistant

## 2021-03-02 ENCOUNTER — Other Ambulatory Visit: Payer: Self-pay

## 2021-03-02 VITALS — BP 126/57 | HR 66 | Ht 65.0 in | Wt 226.0 lb

## 2021-03-02 DIAGNOSIS — R251 Tremor, unspecified: Secondary | ICD-10-CM | POA: Diagnosis not present

## 2021-03-02 DIAGNOSIS — I951 Orthostatic hypotension: Secondary | ICD-10-CM

## 2021-03-02 DIAGNOSIS — Z79899 Other long term (current) drug therapy: Secondary | ICD-10-CM

## 2021-03-02 DIAGNOSIS — E119 Type 2 diabetes mellitus without complications: Secondary | ICD-10-CM

## 2021-03-02 DIAGNOSIS — E7849 Other hyperlipidemia: Secondary | ICD-10-CM | POA: Diagnosis not present

## 2021-03-02 DIAGNOSIS — Z780 Asymptomatic menopausal state: Secondary | ICD-10-CM

## 2021-03-02 DIAGNOSIS — R2689 Other abnormalities of gait and mobility: Secondary | ICD-10-CM | POA: Diagnosis not present

## 2021-03-02 DIAGNOSIS — J440 Chronic obstructive pulmonary disease with acute lower respiratory infection: Secondary | ICD-10-CM

## 2021-03-02 DIAGNOSIS — Z13820 Encounter for screening for osteoporosis: Secondary | ICD-10-CM

## 2021-03-02 DIAGNOSIS — Z131 Encounter for screening for diabetes mellitus: Secondary | ICD-10-CM

## 2021-03-02 DIAGNOSIS — E079 Disorder of thyroid, unspecified: Secondary | ICD-10-CM

## 2021-03-02 DIAGNOSIS — W19XXXA Unspecified fall, initial encounter: Secondary | ICD-10-CM | POA: Diagnosis not present

## 2021-03-02 DIAGNOSIS — Z23 Encounter for immunization: Secondary | ICD-10-CM | POA: Diagnosis not present

## 2021-03-02 LAB — POCT GLYCOSYLATED HEMOGLOBIN (HGB A1C): Hemoglobin A1C: 5.4 % (ref 4.0–5.6)

## 2021-03-02 NOTE — Patient Instructions (Addendum)
Vitamin d 2000 units a day.   Orthostatic Hypotension Blood pressure is a measurement of how strongly, or weakly, your circulating blood is pressing against the walls of your arteries. Orthostatic hypotension is a drop in blood pressure that can happen when you change positions, such as when you go from lying down to standing. Arteries are blood vessels that carry blood from your heart throughout your body. When blood pressure is too low, you may not get enough blood to your brain or to the rest of your organs. Orthostatic hypotension can cause light-headedness, sweating, rapid heartbeat, blurred vision, and fainting. These symptoms require further investigation into the cause. What are the causes? Orthostatic hypotension can be caused by many things, including: Sudden changes in posture, such as standing up quickly after you have been sitting or lying down. Loss of blood (anemia) or loss of body fluids (dehydration). Heart problems, neurologic problems, or hormone problems. Pregnancy. Aging. The risk for this condition increases as you get older. Severe infection (sepsis). Certain medicines, such as medicines for high blood pressure or medicines that make the body lose excess fluids (diuretics). What are the signs or symptoms? Symptoms of this condition may include: Weakness, light-headedness, or dizziness. Sweating. Blurred vision. Tiredness (fatigue). Rapid heartbeat. Fainting, in severe cases. How is this diagnosed? This condition is diagnosed based on: Your symptoms and medical history. Your blood pressure measurements. Your health care provider will check your blood pressure when you are: Lying down. Sitting. Standing. A blood pressure reading is recorded as two numbers, such as "120 over 80" (or 120/80). The first ("top") number is called the systolic pressure. It is a measure of the pressure in your arteries as your heart beats. The second ("bottom") number is called the  diastolic pressure. It is a measure of the pressure in your arteries when your heart relaxes between beats. Blood pressure is measured in a unit called mmHg. Healthy blood pressure for most adults is 120/80 mmHg. Orthostatic hypotension is defined as a 20 mmHg drop in systolic pressure or a 10 mmHg drop in diastolic pressure within 3 minutes of standing. Other information or tests that may be used to diagnose orthostatic hypotension include: Your other vital signs, such as your heart rate and temperature. Blood tests. An electrocardiogram (ECG) or echocardiogram. A Holter monitor. This is a device you wear that records your heart rhythm continuously, usually for 24-48 hours. Tilt table test. For this test, you will be safely secured to a table that moves you from a lying position to an upright position. Your heart rhythm and blood pressure will be monitored during the test. How is this treated? This condition may be treated by: Changing your diet. This may involve eating more salt (sodium) or drinking more water. Changing the dosage of certain medicines you are taking that might be lowering your blood pressure. Correcting the underlying reason for the orthostatic hypotension. Wearing compression stockings. Taking medicines to raise your blood pressure. Avoiding actions that trigger symptoms. Follow these instructions at home: Medicines Take over-the-counter and prescription medicines only as told by your health care provider. Follow instructions from your health care provider about changing the dosage of your current medicines, if this applies. Do not stop or adjust any of your medicines on your own. Eating and drinking  Drink enough fluid to keep your urine pale yellow. Eat extra salt only as directed. Do not add extra salt to your diet unless advised by your health care provider. Eat frequent, small meals. Avoid  standing up suddenly after eating. General instructions  Get up slowly from  lying down or sitting positions. This gives your blood pressure a chance to adjust. Avoid hot showers and excessive heat as directed by your health care provider. Engage in regular physical activity as directed by your health care provider. If you have compression stockings, wear them as told. Keep all follow-up visits. This is important. Contact a health care provider if: You have a fever for more than 2-3 days. You feel more thirsty than usual. You feel dizzy or weak. Get help right away if: You have chest pain. You have a fast or irregular heartbeat. You become sweaty or feel light-headed. You feel short of breath. You faint. You have any symptoms of a stroke. "BE FAST" is an easy way to remember the main warning signs of a stroke: B - Balance. Signs are dizziness, sudden trouble walking, or loss of balance. E - Eyes. Signs are trouble seeing or a sudden change in vision. F - Face. Signs are sudden weakness or numbness of the face, or the face or eyelid drooping on one side. A - Arms. Signs are weakness or numbness in an arm. This happens suddenly and usually on one side of the body. S - Speech. Signs are sudden trouble speaking, slurred speech, or trouble understanding what people say. T - Time. Time to call emergency services. Write down what time symptoms started. You have other signs of a stroke, such as: A sudden, severe headache with no known cause. Nausea or vomiting. Seizure. These symptoms may represent a serious problem that is an emergency. Do not wait to see if the symptoms will go away. Get medical help right away. Call your local emergency services (911 in the [place] Do not drive yourself to the hospital. Summary Orthostatic hypotension is a sudden drop in blood pressure. It can cause light-headedness, sweating, rapid heartbeat, blurred vision, and fainting. Orthostatic hypotension can be diagnosed by having your blood pressure taken while lying down, sitting, and then  standing. Treatment may involve changing your diet, wearing compression stockings, sitting up slowly, adjusting your medicines, or correcting the underlying reason for the orthostatic hypotension. Get help right away if you have chest pain, a fast or irregular heartbeat, or symptoms of a stroke. This information is not intended to replace advice given to you by your health care provider. Make sure you discuss any questions you have with your health care provider. Document Revised: 06/24/2020 Document Reviewed: 06/24/2020 Elsevier Patient Education  [place].

## 2021-03-04 ENCOUNTER — Other Ambulatory Visit: Payer: Self-pay | Admitting: Physician Assistant

## 2021-03-04 DIAGNOSIS — G4700 Insomnia, unspecified: Secondary | ICD-10-CM

## 2021-03-04 DIAGNOSIS — J0140 Acute pansinusitis, unspecified: Secondary | ICD-10-CM

## 2021-03-10 DIAGNOSIS — M79661 Pain in right lower leg: Secondary | ICD-10-CM | POA: Diagnosis not present

## 2021-03-10 DIAGNOSIS — M79662 Pain in left lower leg: Secondary | ICD-10-CM | POA: Diagnosis not present

## 2021-03-11 ENCOUNTER — Other Ambulatory Visit: Payer: Self-pay | Admitting: Physician Assistant

## 2021-03-11 DIAGNOSIS — J0140 Acute pansinusitis, unspecified: Secondary | ICD-10-CM

## 2021-03-22 DIAGNOSIS — I951 Orthostatic hypotension: Secondary | ICD-10-CM | POA: Insufficient documentation

## 2021-03-22 NOTE — Progress Notes (Signed)
Subjective:    Patient ID: Sarah Wright, female    DOB: 1946-02-24, 75 y.o.   MRN: [id number]  HPI Patient is a 75 year old obese female with aortic atherosclerosis, type 2 diabetes, chronic venous insufficiency, COPD, MDD, insomnia who presents to the clinic for follow-up.  Her daughter does accompany her.  Patient is not checking her sugars.  She is compliant with her medications.  She denies any hypoglycemic events.  She is only on Ozempic.  She denies any open sores or wounds.  Patient denies any chest pain, palpitations, headaches or vision changes.  Her daughter does mention some concerns with her balance, dizziness.  They describe events where she feels like she is going to pass out.  She denies any room spinning but more like her vision goes dark and she is going to drop.  She is fallen at least once. .. Active Ambulatory Problems    Diagnosis Date Noted   Anxiety and depression 03/16/2014   Hyperlipidemia 03/16/2014   COLD (chronic obstructive lung disease) ([place]) 03/16/2014   Insomnia 03/16/2014   Esophageal reflux 03/16/2014   Bilateral hand pain 03/16/2014   DDD (degenerative disc disease), lumbar 03/16/2014   Lumbar spondylosis 03/17/2014   Aortic atherosclerosis ([place]) 03/17/2014   Chronic venous insufficiency 03/27/2014   H/O colonoscopy 04/02/2014   Thyroid disease 04/02/2014   Cataract 04/02/2014   Type 2 diabetes mellitus ([place]) 04/02/2014   Primary osteoarthritis of both knees 07/21/2014   Morbid obesity ([place]) 10/07/2014   Dizziness 01/04/2015   Sensorineural hearing loss of both ears 01/26/2015   Thyroid nodule 06/22/2015   Memory loss 07/29/2015   Psoriasis 09/23/2015   Right shoulder injury 01/27/2016   Balance problem 08/13/2016   Left shoulder pain 08/13/2016   Numbness around mouth 09/08/2016   DDD (degenerative disc disease), cervical 12/29/2016   Microalbuminuria 01/30/2017   Chronic pain syndrome 05/06/2017   Skin abrasion 08/01/2017    Leukocytes in urine 08/01/2017   Dysuria 08/01/2017   Panic attack 08/01/2017   RLS (restless legs syndrome) 08/12/2018   COPD exacerbation ([place]) 08/14/2019   Right ankle pain 01/20/2020   Severe episode of recurrent major depressive disorder, without psychotic features ([place]) 08/20/2020   Excessive crying 08/20/2020   Resolved Ambulatory Problems    Diagnosis Date Noted   No Resolved Ambulatory Problems   Past Medical History:  Diagnosis Date   Diabetes mellitus without complication ([place])      Review of Systems See HPI.     Objective:   Physical Exam Vitals reviewed.  Constitutional:      Appearance: Normal appearance. She is obese.  HENT:     Head: Normocephalic.  Neck:     Vascular: No carotid bruit.  Cardiovascular:     Rate and Rhythm: Normal rate and regular rhythm.     Pulses: Normal pulses.  Pulmonary:     Effort: Pulmonary effort is normal.     Breath sounds: Normal breath sounds.  Musculoskeletal:     Right lower leg: No edema.     Left lower leg: No edema.  Neurological:     General: No focal deficit present.     Mental Status: She is alert and oriented to person, place, and time.  Psychiatric:        Mood and Affect: Mood normal.      .. Results for orders placed or performed in visit on 03/02/21  POCT glycosylated hemoglobin (Hb A1C)  Result Value Ref Range   Hemoglobin A1C 5.4  4.0 - 5.6 %   HbA1c POC (<> result, manual entry)     HbA1c, POC (prediabetic range)     HbA1c, POC (controlled diabetic range)         Assessment & Plan:  [name] KitchenMarland KitchenLuanne was seen today for follow-up.  Diagnoses and all orders for this visit:  Type 2 diabetes mellitus without complication, without long-term current use of insulin (HCC) -     POCT glycosylated hemoglobin (Hb A1C) -     Urine Microalbumin w/creat. ratio  Other hyperlipidemia -     Lipid Panel w/reflex Direct LDL  Medication management -     POCT glycosylated hemoglobin (Hb A1C) -     TSH -      Lipid Panel w/reflex Direct LDL -     COMPLETE METABOLIC PANEL WITH GFR -     CBC with Differential/Platelet  Thyroid disease -     TSH  Diabetes mellitus screening -     COMPLETE METABOLIC PANEL WITH GFR  Flu vaccine need -     Flu Vaccine QUAD High Dose(Fluad)  Balance problem  Tremor  Fall, initial encounter  Osteoporosis screening -     DG Bone Density; Future  Post-menopausal -     DG Bone Density; Future  A1C to goal.  Continue same medications.  BP to goal.  On statin.  Needs eye exam and foot exam.  Covid vaccine x3. Needs booster.  Flu shot given today.   Bone density ordered.   COPD- consider overnight oximetry to make sure oxygen is staying up at night.   Having a tremor and falling issues.  I do think some is orthostatic hypotension Discussed compression stockings and staying hydrated.  Neuro referral.

## 2021-03-23 ENCOUNTER — Other Ambulatory Visit: Payer: Self-pay | Admitting: Physician Assistant

## 2021-03-23 DIAGNOSIS — F332 Major depressive disorder, recurrent severe without psychotic features: Secondary | ICD-10-CM

## 2021-03-26 ENCOUNTER — Other Ambulatory Visit: Payer: Self-pay | Admitting: Physician Assistant

## 2021-03-26 DIAGNOSIS — G2581 Restless legs syndrome: Secondary | ICD-10-CM

## 2021-04-06 ENCOUNTER — Other Ambulatory Visit: Payer: Self-pay | Admitting: Physician Assistant

## 2021-04-06 DIAGNOSIS — F419 Anxiety disorder, unspecified: Secondary | ICD-10-CM

## 2021-04-06 DIAGNOSIS — E7849 Other hyperlipidemia: Secondary | ICD-10-CM

## 2021-04-06 DIAGNOSIS — F32A Depression, unspecified: Secondary | ICD-10-CM

## 2021-04-14 ENCOUNTER — Other Ambulatory Visit: Payer: Self-pay | Admitting: Physician Assistant

## 2021-04-14 DIAGNOSIS — H35033 Hypertensive retinopathy, bilateral: Secondary | ICD-10-CM | POA: Diagnosis not present

## 2021-04-14 DIAGNOSIS — H43811 Vitreous degeneration, right eye: Secondary | ICD-10-CM | POA: Diagnosis not present

## 2021-04-14 DIAGNOSIS — J42 Unspecified chronic bronchitis: Secondary | ICD-10-CM

## 2021-04-14 DIAGNOSIS — H35372 Puckering of macula, left eye: Secondary | ICD-10-CM | POA: Diagnosis not present

## 2021-04-14 DIAGNOSIS — H353133 Nonexudative age-related macular degeneration, bilateral, advanced atrophic without subfoveal involvement: Secondary | ICD-10-CM | POA: Diagnosis not present

## 2021-04-14 DIAGNOSIS — E119 Type 2 diabetes mellitus without complications: Secondary | ICD-10-CM

## 2021-04-24 ENCOUNTER — Other Ambulatory Visit: Payer: Self-pay | Admitting: Physician Assistant

## 2021-04-24 DIAGNOSIS — M25512 Pain in left shoulder: Secondary | ICD-10-CM

## 2021-05-03 ENCOUNTER — Other Ambulatory Visit: Payer: Self-pay | Admitting: Physician Assistant

## 2021-05-03 DIAGNOSIS — R110 Nausea: Secondary | ICD-10-CM

## 2021-05-06 DIAGNOSIS — E1151 Type 2 diabetes mellitus with diabetic peripheral angiopathy without gangrene: Secondary | ICD-10-CM | POA: Diagnosis not present

## 2021-05-06 DIAGNOSIS — M2041 Other hammer toe(s) (acquired), right foot: Secondary | ICD-10-CM | POA: Diagnosis not present

## 2021-05-06 DIAGNOSIS — D2371 Other benign neoplasm of skin of right lower limb, including hip: Secondary | ICD-10-CM | POA: Diagnosis not present

## 2021-05-07 ENCOUNTER — Other Ambulatory Visit: Payer: Self-pay | Admitting: Physician Assistant

## 2021-05-07 DIAGNOSIS — E7849 Other hyperlipidemia: Secondary | ICD-10-CM

## 2021-05-09 NOTE — Telephone Encounter (Signed)
LVM for patient to call back to get this appointment scheduled for any further medication refills. AM

## 2021-05-11 DIAGNOSIS — Z20822 Contact with and (suspected) exposure to covid-19: Secondary | ICD-10-CM | POA: Diagnosis not present

## 2021-05-13 ENCOUNTER — Other Ambulatory Visit: Payer: Self-pay | Admitting: Physician Assistant

## 2021-05-13 DIAGNOSIS — F332 Major depressive disorder, recurrent severe without psychotic features: Secondary | ICD-10-CM

## 2021-05-22 ENCOUNTER — Other Ambulatory Visit: Payer: Self-pay | Admitting: Physician Assistant

## 2021-05-22 DIAGNOSIS — E7849 Other hyperlipidemia: Secondary | ICD-10-CM

## 2021-06-03 ENCOUNTER — Telehealth (INDEPENDENT_AMBULATORY_CARE_PROVIDER_SITE_OTHER): Payer: Medicare Other | Admitting: Physician Assistant

## 2021-06-03 DIAGNOSIS — G2581 Restless legs syndrome: Secondary | ICD-10-CM | POA: Diagnosis not present

## 2021-06-03 DIAGNOSIS — E7849 Other hyperlipidemia: Secondary | ICD-10-CM

## 2021-06-03 MED ORDER — ROPINIROLE HCL 2 MG PO TABS: 2.0000 mg | ORAL_TABLET | Freq: Every day | ORAL | 1 refills | Status: DC

## 2021-06-03 MED ORDER — ATORVASTATIN CALCIUM 20 MG PO TABS: ORAL_TABLET | ORAL | 1 refills | Status: DC

## 2021-06-03 NOTE — Progress Notes (Signed)
Pt would like to talk about possibly going up in dosage and need a refill on : rOPINIRole (REQUIP) 0.25 MG tablet

## 2021-06-03 NOTE — Progress Notes (Signed)
..Virtual Visit via Video Note  I connected with Sarah Wright on 06/03/2021   2:20 PM EST by a video enabled telemedicine application and verified that I am speaking with the correct person using two identifiers.  Location: Patient: home Provider: clinic  .[name] KitchenParticipating in visit:  Patient: Sarah Wright Provider: [name] PA-c   I discussed the limitations of evaluation and management by telemedicine and the availability of in person appointments. The patient expressed understanding and agreed to proceed.  History of Present Illness: Pt is a 76 yo female who wants to discuss increasing her requip for restless legs. She notices she is having to take 4-6 of the .25mg  at bedtime to stop the jumping at times. It does work but she is running out early.    .. Active Ambulatory Problems    Diagnosis Date Noted   Anxiety and depression 03/16/2014   Hyperlipidemia 03/16/2014   COLD (chronic obstructive lung disease) ([place]) 03/16/2014   Insomnia 03/16/2014   Esophageal reflux 03/16/2014   Bilateral hand pain 03/16/2014   DDD (degenerative disc disease), lumbar 03/16/2014   Lumbar spondylosis 03/17/2014   Aortic atherosclerosis ([place]) 03/17/2014   Chronic venous insufficiency 03/27/2014   H/O colonoscopy 04/02/2014   Thyroid disease 04/02/2014   Cataract 04/02/2014   Type 2 diabetes mellitus ([place]) 04/02/2014   Primary osteoarthritis of both knees 07/21/2014   Morbid obesity ([place]) 10/07/2014   Dizziness 01/04/2015   Sensorineural hearing loss of both ears 01/26/2015   Thyroid nodule 06/22/2015   Memory loss 07/29/2015   Psoriasis 09/23/2015   Right shoulder injury 01/27/2016   Balance problem 08/13/2016   Left shoulder pain 08/13/2016   Numbness around mouth 09/08/2016   DDD (degenerative disc disease), cervical 12/29/2016   Microalbuminuria 01/30/2017   Chronic pain syndrome 05/06/2017   Skin abrasion 08/01/2017   Leukocytes in urine 08/01/2017   Dysuria 08/01/2017   Panic attack  08/01/2017   RLS (restless legs syndrome) 08/12/2018   COPD exacerbation ([place]) 08/14/2019   Right ankle pain 01/20/2020   Severe episode of recurrent major depressive disorder, without psychotic features ([place]) 08/20/2020   Excessive crying 08/20/2020   Orthostatic hypotension 03/22/2021   Resolved Ambulatory Problems    Diagnosis Date Noted   No Resolved Ambulatory Problems   Past Medical History:  Diagnosis Date   Diabetes mellitus without complication ([place])     Observations/Objective: No acute distress Normal mood and appearance Normal breathing  .[name] KitchenThere were no vitals filed for this visit. There is no height or weight on file to calculate BMI.    Assessment and Plan: [name] KitchenMarland KitchenLuanne was seen today for medication management.  Diagnoses and all orders for this visit:  RLS (restless legs syndrome) -     rOPINIRole (REQUIP) 2 MG tablet; Take 1 tablet (2 mg total) by mouth at bedtime.  Other hyperlipidemia -     atorvastatin (LIPITOR) 20 MG tablet; TAKE 1 TABLET BY MOUTH EVERY DAY .   Pt needs OV for DM in march.  Refilled lipitor.  Increased requip to 2mg  at bedtime for RLS.   Follow Up Instructions:    I discussed the assessment and treatment plan with the patient. The patient was provided an opportunity to ask questions and all were answered. The patient agreed with the plan and demonstrated an understanding of the instructions.   The patient was advised to call back or seek an in-person evaluation if the symptoms worsen or if the condition fails to improve as anticipated.    [name],  PA-C

## 2021-06-06 ENCOUNTER — Encounter: Payer: Self-pay | Admitting: Physician Assistant

## 2021-06-14 ENCOUNTER — Other Ambulatory Visit: Payer: Self-pay

## 2021-06-14 ENCOUNTER — Encounter: Payer: Self-pay | Admitting: Physician Assistant

## 2021-06-14 ENCOUNTER — Ambulatory Visit (INDEPENDENT_AMBULATORY_CARE_PROVIDER_SITE_OTHER): Payer: Medicare Other | Admitting: Physician Assistant

## 2021-06-14 VITALS — BP 131/63 | HR 80 | Temp 98.5°F | Ht 65.0 in | Wt 232.0 lb

## 2021-06-14 DIAGNOSIS — J0140 Acute pansinusitis, unspecified: Secondary | ICD-10-CM | POA: Diagnosis not present

## 2021-06-14 DIAGNOSIS — H6123 Impacted cerumen, bilateral: Secondary | ICD-10-CM

## 2021-06-14 MED ORDER — AMOXICILLIN-POT CLAVULANATE 875-125 MG PO TABS: 1.0000 | ORAL_TABLET | Freq: Two times a day (BID) | ORAL | 0 refills | Status: AC

## 2021-06-14 NOTE — Progress Notes (Signed)
Subjective:    Patient ID: Sarah Wright, female    DOB: July 07, 1945, 76 y.o.   MRN: [id number]  Sinus Problem Associated symptoms include congestion and coughing (Dry). Pertinent negatives include no ear pain or shortness of breath.   This patient is a well appearing 76 year old female presenting with chief complaint of sinus congestion and pressure for 2 weeks. She states this has been constant for 2 weeks and feels similar to past episodes of sinusitis which were relieved with antibiotics. She has also experienced a non-productive cough. Denies fever, chills, shortness of breath, chest pain, ear pain, changes to bowel habits. She reports diminished hearing on the left side but states this is due to wax buildup.  Review of Systems  Constitutional:  Positive for fatigue. Negative for fever and unexpected weight change.  HENT:  Positive for congestion. Negative for ear discharge, ear pain and hearing loss.   Respiratory:  Positive for cough (Dry). Negative for chest tightness, shortness of breath and wheezing.   Cardiovascular:  Negative for chest pain.  Genitourinary: Negative.  Negative for dysuria, frequency and hematuria.      Objective:   Physical Exam Vitals reviewed.  Constitutional:      General: She is not in acute distress.    Appearance: Normal appearance.  HENT:     Head: Normocephalic and atraumatic.     Right Ear: Tympanic membrane, ear canal and external ear normal.     Left Ear: There is impacted cerumen.     Nose: Congestion present.     Right Sinus: Maxillary sinus tenderness and frontal sinus tenderness (Frontal > Maxillary tenderness) present.     Left Sinus: Maxillary sinus tenderness and frontal sinus tenderness (Frontal > Maxillary tenderness) present.     Mouth/Throat:     Mouth: Mucous membranes are moist.     Pharynx: Posterior oropharyngeal erythema (mild) present.  Cardiovascular:     Rate and Rhythm: Normal rate and regular rhythm.     Pulses: Normal  pulses.     Heart sounds: Normal heart sounds. No murmur heard.   No friction rub. No gallop.  Pulmonary:     Effort: Pulmonary effort is normal. No respiratory distress.     Breath sounds: Normal breath sounds. No wheezing or rales.  Musculoskeletal:     Cervical back: Normal range of motion. No tenderness.  Neurological:     Mental Status: She is alert and oriented to person, place, and time.   [name].Cerumen Removal Template: Indication: Cerumen impaction of the ear(s) Medical necessity statement: On physical examination, cerumen impairs clinically significant portions of the external auditory canal, and tympanic membrane. Noted obstructive, copious cerumen that cannot be removed without magnification and instrumentations requiring physician skills Consent: Discussed benefits and risks of procedure and verbal consent obtained Procedure: Patient was prepped for the procedure. Utilized an otoscope to assess and take note of the ear canal, the tympanic membrane, and the presence, amount, and placement of the cerumen. Gentle water irrigation and soft plastic curette was utilized to remove cerumen.  Post procedure examination shows cerumen was completely removed. Patient tolerated procedure well. The patient is made aware that they may experience temporary vertigo, temporary hearing loss, and temporary discomfort. If these symptom last for more than 24 hours to call the clinic or proceed to the ED.        Assessment & Plan:  [name] KitchenMarland KitchenLuanne was seen today for sinus problem.  Diagnoses and all orders for this visit:  Acute non-recurrent  pansinusitis -     amoxicillin-clavulanate (AUGMENTIN) 875-125 MG tablet; Take 1 tablet by mouth 2 (two) times daily for 10 days.  Bilateral impacted cerumen   Cerumen removed today and ears feeling better Augmentin started for 10 days Continue flonase daily Follow up as needed or if symptoms persist.

## 2021-06-14 NOTE — Patient Instructions (Signed)

## 2021-06-15 ENCOUNTER — Encounter: Payer: Self-pay | Admitting: Physician Assistant

## 2021-06-15 DIAGNOSIS — R251 Tremor, unspecified: Secondary | ICD-10-CM | POA: Diagnosis not present

## 2021-06-15 DIAGNOSIS — F332 Major depressive disorder, recurrent severe without psychotic features: Secondary | ICD-10-CM | POA: Diagnosis not present

## 2021-06-15 DIAGNOSIS — R2689 Other abnormalities of gait and mobility: Secondary | ICD-10-CM | POA: Diagnosis not present

## 2021-06-15 DIAGNOSIS — I700 Atherosclerosis of aorta: Secondary | ICD-10-CM | POA: Diagnosis not present

## 2021-06-15 DIAGNOSIS — J449 Chronic obstructive pulmonary disease, unspecified: Secondary | ICD-10-CM | POA: Diagnosis not present

## 2021-06-15 DIAGNOSIS — E1169 Type 2 diabetes mellitus with other specified complication: Secondary | ICD-10-CM | POA: Diagnosis not present

## 2021-06-15 DIAGNOSIS — R413 Other amnesia: Secondary | ICD-10-CM | POA: Diagnosis not present

## 2021-06-20 ENCOUNTER — Other Ambulatory Visit: Payer: Self-pay | Admitting: Physician Assistant

## 2021-06-20 DIAGNOSIS — F332 Major depressive disorder, recurrent severe without psychotic features: Secondary | ICD-10-CM

## 2021-06-21 ENCOUNTER — Other Ambulatory Visit: Payer: Self-pay | Admitting: Physician Assistant

## 2021-06-21 DIAGNOSIS — G2581 Restless legs syndrome: Secondary | ICD-10-CM

## 2021-06-30 DIAGNOSIS — Z20822 Contact with and (suspected) exposure to covid-19: Secondary | ICD-10-CM | POA: Diagnosis not present

## 2021-07-01 ENCOUNTER — Telehealth: Payer: Self-pay | Admitting: Neurology

## 2021-07-01 NOTE — Progress Notes (Signed)
? ? ?Subjective:   ? ?CC: R foot pain ? ?I, Sarah Wright, LAT, ATC, am serving as scribe for Dr. Lynne Leader. ? ?HPI: Pt is a 76 y/o female presenting w/ c/o R foot pain x years.  She locates her pain to her R lateral ankle .  She was seen previously in 2021 by Dr. Dianah Field for her R foot and ankle. ? ?R foot swelling: yes R ankle and foot ?R ankle mechanical symptoms: yes ?Aggravating factors:walking/weight-bearing activity; L ankle DF, inv and eversion AROM ?Treatments tried: Voltaren gel; Biofreeze; IBU  ? ?Diagnostic imaging: R foot and R ankle XR- 01/20/20 ? ?Pertinent review of Systems: No fevers or chills ? ?Relevant historical information: Multiple foot surgery.  Diabetes. ? ? ?Objective:   ? ?Vitals:  ? 07/04/21 0947  ?BP: (!) 144/84  ?Pulse: 73  ?SpO2: 94%  ? ?General: Well Developed, well nourished, and in no acute distress.  ? ?MSK: Right foot and ankle.  Significant pronation and ankle subluxation medially. ?Prominent screw head or nodule at the dorsal midfoot.  Mildly tender to palpation. ?Tender palpation at the lateral anterior joint line. ?Decreased strength to foot inversion and eversion.   ?Intact strength of plantarflexion and dorsiflexion. ?Stable ligamentous exam. ? ?Lab and Radiology Results ? ?Diagnostic Limited MSK Ultrasound of: Right ankle ?Narrowed joint line at the lateral ankle joint with degenerative changes and mild joint effusion present. ?Intact peroneal tendons at the lateral joint line. ?Posterior tibialis tendon does appear to be intact at the medial joint line. ?Impression: DJD and lateral compression right ankle. ? ? ?X-ray images right ankle obtained today personally and independently interpreted ?Degenerative changes lateral ankle.  No acute fractures.  Stable appearing cystic structure at the medial distal tibia. ?Await formal radiology review ? ? ?Impression and Recommendations:   ? ?Assessment and Plan: ?76 y.o. female with lateral ankle pain in the setting of  significant ankle pronation and subluxation medially.  I think this major source of her pain is lateral ankle impingement with her significant ankle pronation and subluxation. ? ?Provided large scaphoid pad to help reduce the pronation and will work on strengthening of the posterior tibialis tendon with home exercises taught in clinic today.  Also recommend body helix compression sleeve.  Check back in a month.  Consider injection in the future.  She had an injection which did not help much in the past.. ? ?PDMP not reviewed this encounter. ?Orders Placed This Encounter  ?Procedures  ? [place] LIMITED JOINT SPACE STRUCTURES LOW RIGHT(NO LINKED CHARGES)  ?  Order Specific Question:   Reason for Exam (SYMPTOM  OR DIAGNOSIS REQUIRED)  ?  Answer:   R ankle pain  ?  Order Specific Question:   Preferred imaging location?  ?  Answer:   [facility]  ? DG Ankle Complete Right  ?  Standing Status:   Future  ?  Number of Occurrences:   1  ?  Standing Expiration Date:   08/04/2021  ?  Order Specific Question:   Reason for Exam (SYMPTOM  OR DIAGNOSIS REQUIRED)  ?  Answer:   R ankle pain  ?  Order Specific Question:   Preferred imaging location?  ?  Answer:   [facility]  ? ?No orders of the defined types were placed in this encounter. ? ? ?Discussed warning signs or symptoms. Please see discharge instructions. Patient expresses understanding. ? ? ?The above documentation has been reviewed and is accurate and complete Lynne Leader,  M.D. ? ?

## 2021-07-01 NOTE — Telephone Encounter (Signed)
Patient left vm stating she has been trying to contact [facility] for 2 weeks to get a refill on Oxycodone and no one will call her back. Her daughter has also tried to reach out to them. I let her know I would send a message to [facility], but she was not in the office and usually you are under contract to not receive medication from other providers when you are with a pain management doctor. I did ask her to contact the pharmacy to make sure they didn't send medication into the pharmacy and not let her know.  ? ?Luvenia Starch - please advise.  ?

## 2021-07-01 NOTE — Telephone Encounter (Signed)
I agree she will have to continue to try to contact them.  I am sorry that this is probably very frustrating.  But she is under contract with them so if we write a prescription it completely breaks that contract. ?

## 2021-07-02 ENCOUNTER — Other Ambulatory Visit: Payer: Self-pay

## 2021-07-02 ENCOUNTER — Emergency Department (INDEPENDENT_AMBULATORY_CARE_PROVIDER_SITE_OTHER)
Admission: EM | Admit: 2021-07-02 | Discharge: 2021-07-02 | Disposition: A | Discharge status: Home / Self Care | Payer: Medicare Other | Source: Home / Self Care

## 2021-07-02 DIAGNOSIS — G894 Chronic pain syndrome: Secondary | ICD-10-CM

## 2021-07-02 MED ORDER — OXYCODONE HCL 15 MG PO TABS: 15.0000 mg | ORAL_TABLET | Freq: Four times a day (QID) | ORAL | 0 refills | Status: DC

## 2021-07-02 NOTE — Discharge Instructions (Addendum)
Advised patient/daughter to take medication as directed with food.  Advised patient to follow-up with pain management provider regarding future pain medication refill.  Encouraged patient to increase daily water intake while taking this medication. ?

## 2021-07-02 NOTE — ED Triage Notes (Signed)
Pt presents to Urgent Care with c/o generalized pain, shakiness, and agitation due to taking less Oxycodone than usual because she couldn't get a refill from pain clinic this week. Reports she normally takes 4 per day, but yesterday she only took 2 and has not taken any today--reports having 2 pills left.  ?

## 2021-07-02 NOTE — ED Provider Notes (Signed)
[facility]    CSN: [id number] Arrival date & time: 07/02/21  0936      History   Chief Complaint Chief Complaint  Patient presents with   Generalized Body Aches   Shaking   Agitation    HPI Sarah Wright is a 76 y.o. female.   HPI Pleasant 76 year old female presents with generalized shaking and agitation and believes this is due to opioid (oxycodone) withdrawal.  Patient is accompanied by her daughter Pamala Hurry) this morning who reports they have tried to get in touch with her pain management provider to refill medications and unable to reach this provider.  Patient expected to receive refill from pain clinic this week which she did not receive.  Patient reports that she has 2 oxycodone pills remaining and did not take any this morning as she is accustomed to doing.  PMH significant for chronic pain syndrome, COPD, T2DM, and chronic venous insufficiency.  Past Medical History:  Diagnosis Date   Diabetes mellitus without complication ([place])    Hyperlipidemia    Thyroid disease     Patient Active Problem List   Diagnosis Date Noted   Bilateral impacted cerumen 06/14/2021   Orthostatic hypotension 03/22/2021   Severe episode of recurrent major depressive disorder, without psychotic features ([place] ) 08/20/2020   Excessive crying 08/20/2020   Right ankle pain 01/20/2020   COPD exacerbation ([place]) 08/14/2019   RLS (restless legs syndrome) 08/12/2018   Skin abrasion 08/01/2017   Leukocytes in urine 08/01/2017   Dysuria 08/01/2017   Panic attack 08/01/2017   Chronic pain syndrome 05/06/2017   Microalbuminuria 01/30/2017   DDD (degenerative disc disease), cervical 12/29/2016   Numbness around mouth 09/08/2016   Balance problem 08/13/2016   Left shoulder pain 08/13/2016   Right shoulder injury 01/27/2016   Psoriasis 09/23/2015   Memory loss 07/29/2015   Thyroid nodule 06/22/2015   Sensorineural hearing loss of both ears 01/26/2015   Dizziness 01/04/2015   Morbid  obesity ([place]) 10/07/2014   Primary osteoarthritis of both knees 07/21/2014   H/O colonoscopy 04/02/2014   Thyroid disease 04/02/2014   Cataract 04/02/2014   Type 2 diabetes mellitus ([place]) 04/02/2014   Chronic venous insufficiency 03/27/2014   Lumbar spondylosis 03/17/2014   Aortic atherosclerosis ([place]) 03/17/2014   Anxiety and depression 03/16/2014   Hyperlipidemia 03/16/2014   COLD (chronic obstructive lung disease) ([place]) 03/16/2014   Insomnia 03/16/2014   Esophageal reflux 03/16/2014   Bilateral hand pain 03/16/2014   DDD (degenerative disc disease), lumbar 03/16/2014    Past Surgical History:  Procedure Laterality Date   ABDOMINAL HYSTERECTOMY     CARPAL TUNNEL RELEASE     both wrists   CATARACT EXTRACTION, BILATERAL     CERVICAL FUSION     GALLBLADDER SURGERY     right foot surgery     THYROIDECTOMY, PARTIAL     right side removed    OB History   No obstetric history on file.      Home Medications    Prior to Admission medications   Medication Sig Start Date End Date Taking? Authorizing Provider  oxyCODONE (ROXICODONE) 15 MG immediate release tablet Take 1 tablet (15 mg total) by mouth 4 (four) times daily. 07/02/21 08/01/21 Yes Eliezer Lofts, FNP  aspirin 81 MG tablet Take 81 mg by mouth daily.    [provider]  atorvastatin (LIPITOR) 20 MG tablet TAKE 1 TABLET BY MOUTH EVERY DAY . 06/03/21   Breeback, Jade L, PA-C  buPROPion (WELLBUTRIN XL) 150 MG 24  hr tablet TAKE 2 TABLETS BY MOUTH DAILY. 04/06/21   Breeback, Jade L, PA-C  donepezil (ARICEPT) 10 MG tablet TAKE 1 TABLET BY MOUTH EVERY DAY 12/03/20   Breeback, Jade L, PA-C  DULoxetine (CYMBALTA) 60 MG capsule TAKE 1 CAPSULE BY MOUTH TWICE A DAY 05/13/21   Breeback, Jade L, PA-C  fluticasone (FLONASE) 50 MCG/ACT nasal spray SPRAY 2 SPRAYS INTO EACH NOSTRIL EVERY DAY 03/14/21   Breeback, Jade L, PA-C  furosemide (LASIX) 20 MG tablet Take daily as needed for lower extremity swelling. Needs appointment for  another refill 06/07/20   [name] L, PA-C  hydrOXYzine (ATARAX/VISTARIL) 25 MG tablet TAKE 1 TABLET BY MOUTH AS NEEDED UP TO TWICE A DAY 01/14/21   Breeback, Jade L, PA-C  ipratropium-albuterol (DUONEB) 0.5-2.5 (3) MG/3ML SOLN Take 3 mLs by nebulization every 4 (four) hours as needed. 03/17/20   Breeback, Royetta Car, PA-C  levalbuterol (XOPENEX HFA) 45 MCG/ACT inhaler Inhale 1-2 puffs into the lungs every 6 (six) hours as needed for wheezing. 12/28/20   Breeback, Royetta Car, PA-C  meloxicam (MOBIC) 15 MG tablet TAKE 1 TABLET BY MOUTH EVERY DAY 04/26/21   Breeback, Jade L, PA-C  montelukast (SINGULAIR) 10 MG tablet TAKE 1 TABLET BY MOUTH EVERYDAY AT BEDTIME 04/14/21   Breeback, Jade L, PA-C  OLANZapine (ZYPREXA) 7.5 MG tablet TAKE 1 TABLET BY MOUTH EVERYDAY AT BEDTIME 06/20/21   Breeback, Jade L, PA-C  ondansetron (ZOFRAN) 8 MG tablet TAKE 1 TABLET BY MOUTH EVERY 8 HOURS AS NEEDED FOR NAUSEA OR VOMITING. 05/04/21   Breeback, Jade L, PA-C  OZEMPIC, 1 MG/DOSE, 4 MG/3ML SOPN INJECT '1MG'$  INTO THE SKIN ONCE A WEEK 04/14/21   Breeback, Jade L, PA-C  rOPINIRole (REQUIP) 2 MG tablet Take 1 tablet (2 mg total) by mouth at bedtime. 06/03/21   Breeback, Jade L, PA-C  topiramate (TOPAMAX) 50 MG tablet TAKE 1 TABLET BY MOUTH TWICE A DAY 04/06/21   Breeback, Jade L, PA-C  traZODone (DESYREL) 50 MG tablet TAKE 1/2 TO 1 TABLET (25-50 MG TOTAL) BY MOUTH AT BEDTIME AS NEEDED FOR SLEEP. NEEDS APPT 03/04/21   Breeback, Jade L, PA-C  TRELEGY ELLIPTA 100-62.5-25 MCG/ACT AEPB INHALE 1 PUFF BY MOUTH EVERY DAY 04/14/21   Donella Stade, PA-C    Family History Family History  Problem Relation Age of Onset   Depression Mother    Heart attack Father    Diabetes Father    Uterine cancer Other    Pancreatic cancer Other    Hypertension Other        parents    Social History Social History   Tobacco Use   Smoking status: Former    Types: Cigarettes    Quit date: 02/12/2014    Years since quitting: 7.3   Smokeless tobacco:  Never  Substance Use Topics   Alcohol use: No    Alcohol/week: 0.0 standard drinks   Drug use: No     Allergies   Molnupiravir and Sulfa antibiotics   Review of Systems Review of Systems  Neurological:        Generalized shaking since this morning  Psychiatric/Behavioral:  Positive for agitation.   All other systems reviewed and are negative.   Physical Exam Triage Vital Signs ED Triage Vitals  Enc Vitals Group     BP 07/02/21 1009 130/75     Pulse Rate 07/02/21 1009 73     Resp 07/02/21 1009 20     Temp 07/02/21 1009 97.9 F (36.6  C)     Temp Source 07/02/21 1009 Oral     SpO2 07/02/21 1009 95 %     Weight 07/02/21 1005 230 lb (104.3 kg)     Height 07/02/21 1005 '5\' 5"'$  (1.651 m)     Head Circumference --      Peak Flow --      Pain Score 07/02/21 1005 6     Pain Loc --      Pain Edu? --      Excl. in [place]? --    No data found.  Updated Vital Signs BP 130/75 (BP Location: Right Arm)    Pulse 73    Temp 97.9 F (36.6 C) (Oral)    Resp 20    Ht '5\' 5"'$  (1.651 m)    Wt 230 lb (104.3 kg)    SpO2 95%    BMI 38.27 kg/m      Physical Exam Vitals and nursing note reviewed.  Constitutional:      General: She is not in acute distress.    Appearance: She is obese. She is not ill-appearing.  HENT:     Head: Normocephalic and atraumatic.     Mouth/Throat:     Mouth: Mucous membranes are moist.     Pharynx: Oropharynx is clear.  Eyes:     Extraocular Movements: Extraocular movements intact.     Conjunctiva/sclera: Conjunctivae normal.     Pupils: Pupils are equal, round, and reactive to light.  Cardiovascular:     Rate and Rhythm: Normal rate and regular rhythm.     Pulses: Normal pulses.     Heart sounds: Normal heart sounds.  Pulmonary:     Effort: Pulmonary effort is normal.     Breath sounds: Normal breath sounds. No wheezing, rhonchi or rales.  Musculoskeletal:     Cervical back: Normal range of motion and neck supple.  Skin:    General: Skin is warm and  dry.  Neurological:     General: No focal deficit present.     Mental Status: She is alert and oriented to person, place, and time. Mental status is at baseline.  Psychiatric:        Mood and Affect: Mood normal.        Behavior: Behavior normal.        Thought Content: Thought content normal.     UC Treatments / Results  Labs (all labs ordered are listed, but only abnormal results are displayed) Labs Reviewed - No data to display  EKG   Radiology No results found.  Procedures Procedures (including critical care time)  Medications Ordered in UC Medications - No data to display  Initial Impression / Assessment and Plan / UC Course  I have reviewed the triage vital signs and the nursing notes.  Pertinent labs & imaging results that were available during my care of the patient were reviewed by me and considered in my medical decision making (see chart for details).     MDM: 1.  Chronic pain syndrome-Rx'd Oxycodone.  PDMP reveals Oxycodone 15 mg HCL instant release tablet 4 times daily #120 for 30 days no refills last written on 05/15/2021 as 1049 am on 07/02/21. Advised patient/daughter to take medication as directed with food.  Advised patient to follow-up with pain management provider regarding future pain medication refill.  Encouraged patient to increase daily water intake while taking this medication.  Patient discharged home, hemodynamically stable. Final Clinical Impressions(s) / UC Diagnoses   Final diagnoses:  Chronic  pain syndrome     Discharge Instructions      Advised patient/daughter to take medication as directed with food.  Advised patient to follow-up with pain management provider regarding future pain medication refill.  Encouraged patient to increase daily water intake while taking this medication.     ED Prescriptions     Medication Sig Dispense Auth. Provider   oxyCODONE (ROXICODONE) 15 MG immediate release tablet Take 1 tablet (15 mg total) by  mouth 4 (four) times daily. 120 tablet Eliezer Lofts, FNP      I have reviewed the PDMP during this encounter.   Eliezer Lofts, [place] 07/02/21 1105

## 2021-07-04 ENCOUNTER — Ambulatory Visit (INDEPENDENT_AMBULATORY_CARE_PROVIDER_SITE_OTHER): Payer: Medicare Other

## 2021-07-04 ENCOUNTER — Encounter: Payer: Self-pay | Admitting: Family Medicine

## 2021-07-04 ENCOUNTER — Other Ambulatory Visit: Payer: Self-pay

## 2021-07-04 ENCOUNTER — Ambulatory Visit: Payer: Self-pay

## 2021-07-04 ENCOUNTER — Ambulatory Visit (INDEPENDENT_AMBULATORY_CARE_PROVIDER_SITE_OTHER): Payer: Medicare Other | Admitting: Family Medicine

## 2021-07-04 VITALS — BP 144/84 | HR 73 | Ht 65.0 in | Wt 234.6 lb

## 2021-07-04 DIAGNOSIS — M25571 Pain in right ankle and joints of right foot: Secondary | ICD-10-CM | POA: Diagnosis not present

## 2021-07-04 DIAGNOSIS — M25871 Other specified joint disorders, right ankle and foot: Secondary | ICD-10-CM | POA: Diagnosis not present

## 2021-07-04 DIAGNOSIS — G8929 Other chronic pain: Secondary | ICD-10-CM | POA: Diagnosis not present

## 2021-07-04 NOTE — Telephone Encounter (Signed)
Spoke with patient, she did go to urgent care this weekend and they pulled her controlled substance history. She is 19 days overdue for her prescriptions. They did write her medication. I encouraged her to keep trying to contact pain management to find out what is going on for future refills since she is under contract.  ?

## 2021-07-04 NOTE — Patient Instructions (Signed)
Nice to meet you today. ? ?I recommend you obtained a compression sleeve to help with your joint problems. There are many options on the market however I recommend obtaining a ankle Body Helix compression sleeve.  You can find information (including how to appropriate measure yourself for sizing) can be found at www.Body http://www.lambert.com/.  Many of these products are health savings account (HSA) eligible.  ? ?You can use the compression sleeve at any time throughout the day but is most important to use while being active as well as for 2 hours post-activity.   It is appropriate to ice following activity with the compression sleeve in place.  ? ?Please perform the exercise program that we have prepared for you and gone over in detail on a daily basis.  In addition to the handout you were provided you can access your program through: [email or web address]-exercise-code.com  ? ?Your unique program code is:  [id number] ? ?Please get an Xray today before you leave. ? ?Follow-up: one month  ?

## 2021-07-04 NOTE — Telephone Encounter (Signed)
LVM for patient to call back to discuss.

## 2021-07-05 NOTE — Progress Notes (Signed)
Right ankle x-ray shows prior surgery and surgical hardware and mild to medium midfoot arthritis.  No fractures are visible.

## 2021-07-07 ENCOUNTER — Other Ambulatory Visit: Payer: Self-pay | Admitting: Physician Assistant

## 2021-07-08 ENCOUNTER — Other Ambulatory Visit: Payer: Self-pay | Admitting: Physician Assistant

## 2021-07-08 DIAGNOSIS — R110 Nausea: Secondary | ICD-10-CM

## 2021-07-10 ENCOUNTER — Other Ambulatory Visit: Payer: Self-pay | Admitting: Physician Assistant

## 2021-07-10 DIAGNOSIS — M25512 Pain in left shoulder: Secondary | ICD-10-CM

## 2021-07-10 DIAGNOSIS — G2581 Restless legs syndrome: Secondary | ICD-10-CM

## 2021-07-12 ENCOUNTER — Ambulatory Visit (INDEPENDENT_AMBULATORY_CARE_PROVIDER_SITE_OTHER): Payer: Medicare Other | Admitting: Physician Assistant

## 2021-07-12 ENCOUNTER — Other Ambulatory Visit: Payer: Self-pay

## 2021-07-12 ENCOUNTER — Encounter: Payer: Self-pay | Admitting: Physician Assistant

## 2021-07-12 VITALS — BP 118/63 | HR 75 | Ht 65.0 in | Wt 234.0 lb

## 2021-07-12 DIAGNOSIS — Z131 Encounter for screening for diabetes mellitus: Secondary | ICD-10-CM | POA: Diagnosis not present

## 2021-07-12 DIAGNOSIS — E119 Type 2 diabetes mellitus without complications: Secondary | ICD-10-CM | POA: Diagnosis not present

## 2021-07-12 DIAGNOSIS — L84 Corns and callosities: Secondary | ICD-10-CM

## 2021-07-12 DIAGNOSIS — E7849 Other hyperlipidemia: Secondary | ICD-10-CM | POA: Diagnosis not present

## 2021-07-12 DIAGNOSIS — Z79899 Other long term (current) drug therapy: Secondary | ICD-10-CM | POA: Diagnosis not present

## 2021-07-12 DIAGNOSIS — E785 Hyperlipidemia, unspecified: Secondary | ICD-10-CM | POA: Diagnosis not present

## 2021-07-12 DIAGNOSIS — G2581 Restless legs syndrome: Secondary | ICD-10-CM | POA: Diagnosis not present

## 2021-07-12 DIAGNOSIS — E079 Disorder of thyroid, unspecified: Secondary | ICD-10-CM | POA: Diagnosis not present

## 2021-07-12 NOTE — Progress Notes (Signed)
? ?Subjective:  ? ? Patient ID: Sarah Wright, female    DOB: [birth year], 76 y.o.   MRN: [id number] ? ?HPI ?Pt is a 76 yo obese female with T2DM, Aortic atheroscleoris, Chronic venous insuffiencey, COPD, hypothyroidism, chronic pain who presents to the clinic for medication refills.  ? ?Pt is doing ok. No concerns today. Not checking her sugar. Not exercising. No open wounds or sores. No CP, palpitations, headaches.  ? ?.. ?Active Ambulatory Problems  ?  Diagnosis Date Noted  ? Anxiety and depression 03/16/2014  ? Dyslipidemia, goal LDL below 70 03/16/2014  ? COLD (chronic obstructive lung disease) ([place]) 03/16/2014  ? Insomnia 03/16/2014  ? Esophageal reflux 03/16/2014  ? Bilateral hand pain 03/16/2014  ? DDD (degenerative disc disease), lumbar 03/16/2014  ? Lumbar spondylosis 03/17/2014  ? Aortic atherosclerosis ([place]) 03/17/2014  ? Chronic venous insufficiency 03/27/2014  ? H/O colonoscopy 04/02/2014  ? Thyroid disease 04/02/2014  ? Cataract 04/02/2014  ? Type 2 diabetes mellitus ([place]) 04/02/2014  ? Primary osteoarthritis of both knees 07/21/2014  ? Morbid obesity ([place]) 10/07/2014  ? Dizziness 01/04/2015  ? Sensorineural hearing loss of both ears 01/26/2015  ? Thyroid nodule 06/22/2015  ? Memory loss 07/29/2015  ? Psoriasis 09/23/2015  ? Right shoulder injury 01/27/2016  ? Balance problem 08/13/2016  ? Left shoulder pain 08/13/2016  ? Numbness around mouth 09/08/2016  ? DDD (degenerative disc disease), cervical 12/29/2016  ? Microalbuminuria 01/30/2017  ? Chronic pain syndrome 05/06/2017  ? Skin abrasion 08/01/2017  ? Leukocytes in urine 08/01/2017  ? Dysuria 08/01/2017  ? Panic attack 08/01/2017  ? RLS (restless legs syndrome) 08/12/2018  ? COPD exacerbation ([place]) 08/14/2019  ? Right ankle pain 01/20/2020  ? Severe episode of recurrent major depressive disorder, without psychotic features ([place]) 08/20/2020  ? Excessive crying 08/20/2020  ? Orthostatic hypotension 03/22/2021  ? Bilateral impacted cerumen 06/14/2021   ? Callus of toe 07/15/2021  ? ?Resolved Ambulatory Problems  ?  Diagnosis Date Noted  ? No Resolved Ambulatory Problems  ? ?Past Medical History:  ?Diagnosis Date  ? Diabetes mellitus without complication ([place])   ? Hyperlipidemia   ? ? ?Review of Systems ? ?  See HPI.  ?Objective:  ? Physical Exam ?Vitals reviewed.  ?Constitutional:   ?   Appearance: Normal appearance. She is obese.  ?HENT:  ?   Head: Normocephalic.  ?Neck:  ?   Vascular: No carotid bruit.  ?Cardiovascular:  ?   Rate and Rhythm: Normal rate and regular rhythm.  ?   Pulses: Normal pulses.  ?Pulmonary:  ?   Effort: Pulmonary effort is normal.  ?   Breath sounds: Normal breath sounds.  ?Abdominal:  ?   Palpations: Abdomen is soft.  ?Musculoskeletal:  ?   Right lower leg: No edema.  ?   Left lower leg: No edema.  ?Lymphadenopathy:  ?   Cervical: No cervical adenopathy.  ?Neurological:  ?   General: No focal deficit present.  ?   Mental Status: She is alert and oriented to person, place, and time.  ?Psychiatric:     ?   Mood and Affect: Mood normal.  ? ? ? ? ?.. ?Results for orders placed or performed in visit on 03/02/21  ?TSH  ?Result Value Ref Range  ? TSH 1.77 0.40 - 4.50 mIU/L  ?Lipid Panel w/reflex Direct LDL  ?Result Value Ref Range  ? Cholesterol 120 <200 mg/dL  ? HDL 50 > OR = 50 mg/dL  ? Triglycerides 147 <150 mg/dL  ?  LDL Cholesterol (Calc) 47 mg/dL (calc)  ? Total CHOL/HDL Ratio 2.4 <5.0 (calc)  ? Non-HDL Cholesterol (Calc) 70 <130 mg/dL (calc)  ?COMPLETE METABOLIC PANEL WITH GFR  ?Result Value Ref Range  ? Glucose, Bld 89 65 - 99 mg/dL  ? BUN 14 7 - 25 mg/dL  ? Creat 1.14 (H) 0.60 - 1.00 mg/dL  ? eGFR 50 (L) > OR = 60 mL/min/1.51m  ? BUN/Creatinine Ratio 12 6 - 22 (calc)  ? Sodium 139 135 - 146 mmol/L  ? Potassium 4.4 3.5 - 5.3 mmol/L  ? Chloride 106 98 - 110 mmol/L  ? CO2 25 20 - 32 mmol/L  ? Calcium 9.0 8.6 - 10.4 mg/dL  ? Total Protein 6.4 6.1 - 8.1 g/dL  ? Albumin 3.9 3.6 - 5.1 g/dL  ? Globulin 2.5 1.9 - 3.7 g/dL (calc)  ? AG Ratio 1.6  1.0 - 2.5 (calc)  ? Total Bilirubin 0.3 0.2 - 1.2 mg/dL  ? Alkaline phosphatase (APISO) 119 37 - 153 U/L  ? AST 16 10 - 35 U/L  ? ALT 13 6 - 29 U/L  ?CBC with Differential/Platelet  ?Result Value Ref Range  ? WBC 8.4 3.8 - 10.8 Thousand/uL  ? RBC 4.69 3.80 - 5.10 Million/uL  ? Hemoglobin 12.0 11.7 - 15.5 g/dL  ? HCT 38.2 35.0 - 45.0 %  ? MCV 81.4 80.0 - 100.0 fL  ? MCH 25.6 (L) 27.0 - 33.0 pg  ? MCHC 31.4 (L) 32.0 - 36.0 g/dL  ? RDW 14.2 11.0 - 15.0 %  ? Platelets 225 140 - 400 Thousand/uL  ? MPV 11.5 7.5 - 12.5 fL  ? Neutro Abs 5,905 1,500 - 7,800 cells/uL  ? Lymphs Abs 1,218 850 - 3,900 cells/uL  ? Absolute Monocytes 840 200 - 950 cells/uL  ? Eosinophils Absolute 336 15 - 500 cells/uL  ? Basophils Absolute 101 0 - 200 cells/uL  ? Neutrophils Relative % 70.3 %  ? Total Lymphocyte 14.5 %  ? Monocytes Relative 10.0 %  ? Eosinophils Relative 4.0 %  ? Basophils Relative 1.2 %  ?POCT glycosylated hemoglobin (Hb A1C)  ?Result Value Ref Range  ? Hemoglobin A1C 5.4 4.0 - 5.6 %  ? HbA1c POC (<> result, manual entry)    ? HbA1c, POC (prediabetic range)    ? HbA1c, POC (controlled diabetic range)    ? ? ?   ?Assessment & Plan:  ?..Sarah Wright was seen today for diabetes and follow-up. ? ?Diagnoses and all orders for this visit: ? ?Type 2 diabetes mellitus without complication, without long-term current use of insulin ([place] ? ?RLS (restless legs syndrome) ? ?Callus of toe ? ?Dyslipidemia, goal LDL below 70 ? ? ?A1C to goal.  ?Continue same medications.  ?.. ?Diabetic Foot Exam - Simple   ?Simple Foot Form ?Diabetic Foot exam was performed with the following findings: Yes 07/12/2021  9:36 AM  ?Visual Inspection ?See comments: Yes ?Sensation Testing ?Intact to touch and monofilament testing bilaterally: Yes ?See comments: Yes ?Pulse Check ?Posterior Tibialis and Dorsalis pulse intact bilaterally: Yes ?Comments ?Multiple callous on right foot, dark area on bottom of 4th digit of right foot, multiple toes with no sensation to  monofilament testing on bilateral feet, onychomycosis on all nails ?  ? ?Pt has appt with podiatry ?Eye exam UTD.  ?Covid vaccine x3 ?Flu/shingrix/pneumonia vaccine  ?Follow up in 3 months.  ? ?

## 2021-07-13 LAB — COMPLETE METABOLIC PANEL WITH GFR
AG Ratio: 1.6 (calc) (ref 1.0–2.5)
ALT: 13 U/L (ref 6–29)
AST: 16 U/L (ref 10–35)
Albumin: 3.9 g/dL (ref 3.6–5.1)
Alkaline phosphatase (APISO): 119 U/L (ref 37–153)
BUN/Creatinine Ratio: 12 (calc) (ref 6–22)
BUN: 14 mg/dL (ref 7–25)
CO2: 25 mmol/L (ref 20–32)
Calcium: 9 mg/dL (ref 8.6–10.4)
Chloride: 106 mmol/L (ref 98–110)
Creat: 1.14 mg/dL — ABNORMAL HIGH (ref 0.60–1.00)
Globulin: 2.5 g/dL (calc) (ref 1.9–3.7)
Glucose, Bld: 89 mg/dL (ref 65–99)
Potassium: 4.4 mmol/L (ref 3.5–5.3)
Sodium: 139 mmol/L (ref 135–146)
Total Bilirubin: 0.3 mg/dL (ref 0.2–1.2)
Total Protein: 6.4 g/dL (ref 6.1–8.1)
eGFR: 50 mL/min/{1.73_m2} — ABNORMAL LOW (ref >=60)

## 2021-07-13 LAB — CBC WITH DIFFERENTIAL/PLATELET
Absolute Monocytes: 840 cells/uL (ref 200–950)
Basophils Absolute: 101 cells/uL (ref 0–200)
Basophils Relative: 1.2 %
Eosinophils Absolute: 336 cells/uL (ref 15–500)
Eosinophils Relative: 4 %
HCT: 38.2 % (ref 35.0–45.0)
Hemoglobin: 12 g/dL (ref 11.7–15.5)
Lymphs Abs: 1218 cells/uL (ref 850–3900)
MCH: 25.6 pg — ABNORMAL LOW (ref 27.0–33.0)
MCHC: 31.4 g/dL — ABNORMAL LOW (ref 32.0–36.0)
MCV: 81.4 fL (ref 80.0–100.0)
MPV: 11.5 fL (ref 7.5–12.5)
Monocytes Relative: 10 %
Neutro Abs: 5905 cells/uL (ref 1500–7800)
Neutrophils Relative %: 70.3 %
Platelets: 225 10*3/uL (ref 140–400)
RBC: 4.69 10*6/uL (ref 3.80–5.10)
RDW: 14.2 % (ref 11.0–15.0)
Total Lymphocyte: 14.5 %
WBC: 8.4 10*3/uL (ref 3.8–10.8)

## 2021-07-13 LAB — LIPID PANEL W/REFLEX DIRECT LDL
Cholesterol: 120 mg/dL (ref <200)
HDL: 50 mg/dL (ref >=50)
LDL Cholesterol (Calc): 47 mg/dL (calc)
Non-HDL Cholesterol (Calc): 70 mg/dL (calc) (ref <130)
Total CHOL/HDL Ratio: 2.4 (calc) (ref <5.0)
Triglycerides: 147 mg/dL (ref <150)

## 2021-07-13 LAB — TSH: TSH: 1.77 mIU/L (ref 0.40–4.50)

## 2021-07-14 DIAGNOSIS — E538 Deficiency of other specified B group vitamins: Secondary | ICD-10-CM | POA: Diagnosis not present

## 2021-07-15 DIAGNOSIS — L84 Corns and callosities: Secondary | ICD-10-CM | POA: Insufficient documentation

## 2021-07-19 DIAGNOSIS — Z20822 Contact with and (suspected) exposure to covid-19: Secondary | ICD-10-CM | POA: Diagnosis not present

## 2021-07-21 DIAGNOSIS — E538 Deficiency of other specified B group vitamins: Secondary | ICD-10-CM | POA: Diagnosis not present

## 2021-07-22 ENCOUNTER — Other Ambulatory Visit: Payer: Self-pay | Admitting: Physician Assistant

## 2021-07-22 DIAGNOSIS — L84 Corns and callosities: Secondary | ICD-10-CM | POA: Diagnosis not present

## 2021-07-22 DIAGNOSIS — E1151 Type 2 diabetes mellitus with diabetic peripheral angiopathy without gangrene: Secondary | ICD-10-CM | POA: Diagnosis not present

## 2021-07-22 DIAGNOSIS — L602 Onychogryphosis: Secondary | ICD-10-CM | POA: Diagnosis not present

## 2021-07-22 DIAGNOSIS — F410 Panic disorder [episodic paroxysmal anxiety] without agoraphobia: Secondary | ICD-10-CM

## 2021-07-25 ENCOUNTER — Telehealth: Payer: Self-pay | Admitting: Neurology

## 2021-07-25 NOTE — Telephone Encounter (Signed)
Please call patient and schedule her to see Luvenia Starch to discuss pain management. Thanks.  ?

## 2021-07-25 NOTE — Telephone Encounter (Signed)
Called patient and scheduled appt for tomorrow.

## 2021-07-25 NOTE — Telephone Encounter (Signed)
Patient is having a hard time hearing back from pain medicine provider. She states at her last appt it was mentioned her getting on a pain contract with [place] for her oxycodone and she would like to do that. Please advise if this is okay for her to schedule a visit for this?  ?

## 2021-07-26 ENCOUNTER — Encounter: Payer: Self-pay | Admitting: Physician Assistant

## 2021-07-26 ENCOUNTER — Ambulatory Visit (INDEPENDENT_AMBULATORY_CARE_PROVIDER_SITE_OTHER): Payer: Medicare Other | Admitting: Physician Assistant

## 2021-07-26 VITALS — BP 143/66 | HR 70 | Ht 65.0 in | Wt 229.0 lb

## 2021-07-26 DIAGNOSIS — M170 Bilateral primary osteoarthritis of knee: Secondary | ICD-10-CM | POA: Diagnosis not present

## 2021-07-26 DIAGNOSIS — M5136 Other intervertebral disc degeneration, lumbar region: Secondary | ICD-10-CM

## 2021-07-26 DIAGNOSIS — M47816 Spondylosis without myelopathy or radiculopathy, lumbar region: Secondary | ICD-10-CM

## 2021-07-26 DIAGNOSIS — G894 Chronic pain syndrome: Secondary | ICD-10-CM | POA: Diagnosis not present

## 2021-07-26 DIAGNOSIS — M5030 Other cervical disc degeneration, unspecified cervical region: Secondary | ICD-10-CM

## 2021-07-26 DIAGNOSIS — M51369 Other intervertebral disc degeneration, lumbar region without mention of lumbar back pain or lower extremity pain: Secondary | ICD-10-CM

## 2021-07-26 DIAGNOSIS — R52 Pain, unspecified: Secondary | ICD-10-CM | POA: Insufficient documentation

## 2021-07-26 MED ORDER — OXYCODONE HCL 15 MG PO TABS: 15.0000 mg | ORAL_TABLET | Freq: Four times a day (QID) | ORAL | 0 refills | Status: DC

## 2021-07-26 NOTE — Progress Notes (Signed)
? ?Subjective:  ? ? Patient ID: [name], female    DOB: 1945-07-09, 76 y.o.   MRN: [id number] ? ?HPI ?Pt is a 76 yo female with chronic lumbar and cervical back pain and has been seeing pain clinic. Her pain clinic has been increasingly hard to get in with and has been controlled on medications for a while and would like me to take over management. Pt is controlled at current doses of medication. No problems to report.  ? ?.. ?Active Ambulatory Problems  ?  Diagnosis Date Noted  ? Anxiety and depression 03/16/2014  ? Dyslipidemia, goal LDL below 70 03/16/2014  ? COLD (chronic obstructive lung disease) ([place]) 03/16/2014  ? Insomnia 03/16/2014  ? Esophageal reflux 03/16/2014  ? Bilateral hand pain 03/16/2014  ? DDD (degenerative disc disease), lumbar 03/16/2014  ? Lumbar spondylosis 03/17/2014  ? Aortic atherosclerosis ([place]) 03/17/2014  ? Chronic venous insufficiency 03/27/2014  ? H/O colonoscopy 04/02/2014  ? Thyroid disease 04/02/2014  ? Cataract 04/02/2014  ? Type 2 diabetes mellitus ([place]) 04/02/2014  ? Primary osteoarthritis of both knees 07/21/2014  ? Morbid obesity ([place]) 10/07/2014  ? Dizziness 01/04/2015  ? Sensorineural hearing loss of both ears 01/26/2015  ? Thyroid nodule 06/22/2015  ? Memory loss 07/29/2015  ? Psoriasis 09/23/2015  ? Right shoulder injury 01/27/2016  ? Balance problem 08/13/2016  ? Left shoulder pain 08/13/2016  ? Numbness around mouth 09/08/2016  ? DDD (degenerative disc disease), cervical 12/29/2016  ? Microalbuminuria 01/30/2017  ? Chronic pain syndrome 05/06/2017  ? Skin abrasion 08/01/2017  ? Leukocytes in urine 08/01/2017  ? Dysuria 08/01/2017  ? Panic attack 08/01/2017  ? RLS (restless legs syndrome) 08/12/2018  ? COPD exacerbation ([place]) 08/14/2019  ? Right ankle pain 01/20/2020  ? Severe episode of recurrent major depressive disorder, without psychotic features ([place]) 08/20/2020  ? Excessive crying 08/20/2020  ? Orthostatic hypotension 03/22/2021  ? Bilateral impacted cerumen  06/14/2021  ? Callus of toe 07/15/2021  ? Pain management 07/26/2021  ? ?Resolved Ambulatory Problems  ?  Diagnosis Date Noted  ? No Resolved Ambulatory Problems  ? ?Past Medical History:  ?Diagnosis Date  ? Diabetes mellitus without complication ([place])   ? Hyperlipidemia   ? ? ? ? ? ?Review of Systems  ?All other systems reviewed and are negative. ? ?   ?Objective:  ? Physical Exam ?Vitals reviewed.  ?Constitutional:   ?   Appearance: Normal appearance. She is obese.  ?HENT:  ?   Head: Normocephalic.  ?Cardiovascular:  ?   Rate and Rhythm: Normal rate and regular rhythm.  ?   Pulses: Normal pulses.  ?Pulmonary:  ?   Effort: Pulmonary effort is normal.  ?   Breath sounds: Normal breath sounds.  ?Neurological:  ?   General: No focal deficit present.  ?   Mental Status: She is alert and oriented to person, place, and time.  ?Psychiatric:     ?   Mood and Affect: Mood normal.  ? ? ? ? ? ?   ?Assessment & Plan:  ?..Izabelle was seen today for pain management. ? ?Diagnoses and all orders for this visit: ? ?Pain management ?-     DRUG MONITORING, PANEL 6 WITH CONFIRMATION, URINE ?-     oxyCODONE (ROXICODONE) 15 MG immediate release tablet; Take 1 tablet (15 mg total) by mouth 4 (four) times daily. ? ?Chronic pain syndrome ?-     DRUG MONITORING, PANEL 6 WITH CONFIRMATION, URINE ?-     oxyCODONE (ROXICODONE)  15 MG immediate release tablet; Take 1 tablet (15 mg total) by mouth 4 (four) times daily. ? ?DDD (degenerative disc disease), cervical ? ?DDD (degenerative disc disease), lumbar ? ?Primary osteoarthritis of both knees ? ?Lumbar spondylosis ? ? ?.. ? Opioid Risk Tool - 07/26/21 1619   ? ?  ? Family History of Substance Abuse  ? Alcohol Negative   ? Illegal Drugs Negative   ? Rx Drugs Negative   ?  ? Personal History of Substance Abuse  ? Alcohol Negative   ? Illegal Drugs Negative   ? Rx Drugs Negative   ?  ? Age  ? Age between 46-45 years  No   ?  ? History of Preadolescent Sexual Abuse  ? History of Preadolescent Sexual  Abuse Negative or Female   ?  ? Psychological Disease  ? Psychological Disease Negative   ? Depression Positive   ?  ? Total Score  ? Opioid Risk Tool Scoring 1   ? Opioid Risk Interpretation Low Risk   ? ?  ?  ? ?  ? ?Low risk ?[name].PDMP reviewed during this encounter. ?No concerns ?Pain contract signed and discussed.  ?Not able to urinate today ?Discussed 3 month follow for controlled substances.  ? ?

## 2021-07-27 ENCOUNTER — Encounter: Payer: Self-pay | Admitting: Physician Assistant

## 2021-07-27 NOTE — Progress Notes (Deleted)
? ?  I, Wendy Poet, LAT, ATC, am serving as scribe for Dr. Lynne Leader. ? ?Sarah Wright is a 76 y.o. female who presents to [place] at [facility] today for f/u of R lateral ankle pain likely due to lateral ankle impingement.  She was last seen by Dr. Georgina Snell on 07/04/21  and was shown a HEP for post tib, was provided a scaphoid pad and was advised to purchase an ankle compression sleeve.  Today, pt reports  ? ?Diagnostic imaging: R ankle XR- 07/04/21; R foot and R ankle XR- 01/20/20 ? ?Pertinent review of systems: *** ? ?Relevant historical information: *** ? ? ?Exam:  ?There were no vitals taken for this visit. ?General: Well Developed, well nourished, and in no acute distress.  ? ?MSK: *** ? ? ? ?Lab and Radiology Results ?No results found for this or any previous visit (from the past 72 hour(s)). ?No results found. ? ? ? ? ?Assessment and Plan: ?76 y.o. female with *** ? ? ?PDMP not reviewed this encounter. ?No orders of the defined types were placed in this encounter. ? ?No orders of the defined types were placed in this encounter. ? ? ? ?Discussed warning signs or symptoms. Please see discharge instructions. Patient expresses understanding. ? ? ?*** ? ?

## 2021-07-30 DIAGNOSIS — Z20822 Contact with and (suspected) exposure to covid-19: Secondary | ICD-10-CM | POA: Diagnosis not present

## 2021-08-01 ENCOUNTER — Other Ambulatory Visit: Payer: Self-pay | Admitting: Physician Assistant

## 2021-08-01 DIAGNOSIS — F332 Major depressive disorder, recurrent severe without psychotic features: Secondary | ICD-10-CM

## 2021-08-01 DIAGNOSIS — G2581 Restless legs syndrome: Secondary | ICD-10-CM

## 2021-08-02 ENCOUNTER — Ambulatory Visit: Payer: Medicare Other | Admitting: Family Medicine

## 2021-08-02 DIAGNOSIS — E538 Deficiency of other specified B group vitamins: Secondary | ICD-10-CM | POA: Diagnosis not present

## 2021-08-03 DIAGNOSIS — M6281 Muscle weakness (generalized): Secondary | ICD-10-CM | POA: Diagnosis not present

## 2021-08-03 DIAGNOSIS — R251 Tremor, unspecified: Secondary | ICD-10-CM | POA: Diagnosis not present

## 2021-08-03 DIAGNOSIS — R413 Other amnesia: Secondary | ICD-10-CM | POA: Diagnosis not present

## 2021-08-03 DIAGNOSIS — R2689 Other abnormalities of gait and mobility: Secondary | ICD-10-CM | POA: Diagnosis not present

## 2021-08-04 ENCOUNTER — Other Ambulatory Visit: Payer: Self-pay | Admitting: Physician Assistant

## 2021-08-04 DIAGNOSIS — E119 Type 2 diabetes mellitus without complications: Secondary | ICD-10-CM

## 2021-08-09 DIAGNOSIS — E538 Deficiency of other specified B group vitamins: Secondary | ICD-10-CM | POA: Diagnosis not present

## 2021-08-11 DIAGNOSIS — Z20822 Contact with and (suspected) exposure to covid-19: Secondary | ICD-10-CM | POA: Diagnosis not present

## 2021-08-12 DIAGNOSIS — Z20822 Contact with and (suspected) exposure to covid-19: Secondary | ICD-10-CM | POA: Diagnosis not present

## 2021-08-17 ENCOUNTER — Other Ambulatory Visit: Payer: Self-pay | Admitting: Physician Assistant

## 2021-08-17 DIAGNOSIS — J42 Unspecified chronic bronchitis: Secondary | ICD-10-CM

## 2021-08-18 DIAGNOSIS — R2689 Other abnormalities of gait and mobility: Secondary | ICD-10-CM | POA: Diagnosis not present

## 2021-08-18 DIAGNOSIS — R413 Other amnesia: Secondary | ICD-10-CM | POA: Diagnosis not present

## 2021-08-18 DIAGNOSIS — M1711 Unilateral primary osteoarthritis, right knee: Secondary | ICD-10-CM | POA: Diagnosis not present

## 2021-08-18 DIAGNOSIS — R251 Tremor, unspecified: Secondary | ICD-10-CM | POA: Diagnosis not present

## 2021-08-18 DIAGNOSIS — M1712 Unilateral primary osteoarthritis, left knee: Secondary | ICD-10-CM | POA: Diagnosis not present

## 2021-08-19 DIAGNOSIS — Z20822 Contact with and (suspected) exposure to covid-19: Secondary | ICD-10-CM | POA: Diagnosis not present

## 2021-08-22 DIAGNOSIS — Z20822 Contact with and (suspected) exposure to covid-19: Secondary | ICD-10-CM | POA: Diagnosis not present

## 2021-08-28 ENCOUNTER — Other Ambulatory Visit: Payer: Self-pay | Admitting: Physician Assistant

## 2021-08-28 DIAGNOSIS — G4700 Insomnia, unspecified: Secondary | ICD-10-CM

## 2021-08-30 DIAGNOSIS — Z20822 Contact with and (suspected) exposure to covid-19: Secondary | ICD-10-CM | POA: Diagnosis not present

## 2021-08-31 DIAGNOSIS — Z20822 Contact with and (suspected) exposure to covid-19: Secondary | ICD-10-CM | POA: Diagnosis not present

## 2021-09-02 ENCOUNTER — Other Ambulatory Visit: Payer: Self-pay | Admitting: Physician Assistant

## 2021-09-02 DIAGNOSIS — R251 Tremor, unspecified: Secondary | ICD-10-CM | POA: Diagnosis not present

## 2021-09-02 DIAGNOSIS — R413 Other amnesia: Secondary | ICD-10-CM | POA: Diagnosis not present

## 2021-09-02 DIAGNOSIS — R2689 Other abnormalities of gait and mobility: Secondary | ICD-10-CM | POA: Diagnosis not present

## 2021-09-02 DIAGNOSIS — M170 Bilateral primary osteoarthritis of knee: Secondary | ICD-10-CM | POA: Diagnosis not present

## 2021-09-08 ENCOUNTER — Encounter: Payer: Self-pay | Admitting: Family Medicine

## 2021-09-08 ENCOUNTER — Encounter: Payer: Self-pay | Admitting: Physician Assistant

## 2021-09-08 ENCOUNTER — Telehealth (INDEPENDENT_AMBULATORY_CARE_PROVIDER_SITE_OTHER): Payer: Medicare Other | Admitting: Family Medicine

## 2021-09-08 ENCOUNTER — Other Ambulatory Visit: Payer: Self-pay | Admitting: Physician Assistant

## 2021-09-08 DIAGNOSIS — J069 Acute upper respiratory infection, unspecified: Secondary | ICD-10-CM | POA: Diagnosis not present

## 2021-09-08 DIAGNOSIS — J441 Chronic obstructive pulmonary disease with (acute) exacerbation: Secondary | ICD-10-CM | POA: Diagnosis not present

## 2021-09-08 DIAGNOSIS — R110 Nausea: Secondary | ICD-10-CM

## 2021-09-08 DIAGNOSIS — J0140 Acute pansinusitis, unspecified: Secondary | ICD-10-CM

## 2021-09-08 DIAGNOSIS — F332 Major depressive disorder, recurrent severe without psychotic features: Secondary | ICD-10-CM

## 2021-09-08 MED ORDER — BENZONATATE 200 MG PO CAPS: 200.0000 mg | ORAL_CAPSULE | Freq: Three times a day (TID) | ORAL | 0 refills | Status: DC | PRN

## 2021-09-08 MED ORDER — PREDNISONE 20 MG PO TABS: 40.0000 mg | ORAL_TABLET | Freq: Every day | ORAL | 0 refills | Status: DC

## 2021-09-08 NOTE — Progress Notes (Signed)
Called pt and LVM advised her that I was calling to do her prescreening  Pt reports that her sxs started 3 days ago taking Excedrin migraine and aleve.   She stated she is experiencing a cough, nasal congestion. No color. No fever or sweats only chills.   She reports that she has been exposed to her granddaughter who has had a cold.

## 2021-09-08 NOTE — Telephone Encounter (Signed)
Pt given appt for 2:40pm today.  Charyl Bigger, CMA

## 2021-09-08 NOTE — Progress Notes (Signed)
    Virtual Visit via Video Note  I connected with Sarah Wright on 09/08/21 at  2:40 PM EDT by a video enabled telemedicine application and verified that I am speaking with the correct person using two identifiers.   I discussed the limitations of evaluation and management by telemedicine and the availability of in person appointments. The patient expressed understanding and agreed to proceed.  Patient location: at home Provider location: in office  Subjective:    CC:   Chief Complaint  Patient presents with   Sinusitis    HPI: Called pt and LVM advised her that I was calling to do her prescreening   Pt reports that her sxs started 3 days ago taking Excedrin migraine and aleve.  Initial symptom was a sore throat that has now resolved but now she has significant nasal congestion facial pressure and coughing.  She feels like her phlegm is loose and it is moving.  She has not done a COVID test.  She also has seasonal allergies and year-round allergies.  But says she is not currently using any allergy medication.  She also has COPD and she is on Trelegy which she does use daily.  She has noticed some intermittent wheezing.   She stated she is experiencing a cough, nasal congestion. No color. No fever or sweats only chills.    She reports that she has been exposed to her granddaughter who has had a cold.   Past medical history, Surgical history, Family history not pertinant except as noted below, Social history, Allergies, and medications have been entered into the medical record, reviewed, and corrections made.    Objective:    General: Speaking clearly in complete sentences without any shortness of breath.  Alert and oriented x3.  Normal judgment. No apparent acute distress.    Impression and Recommendations:    Problem List Items Addressed This Visit       Respiratory   COPD exacerbation ([place]) - Primary   Relevant Medications   predniSONE (DELTASONE) 20 MG tablet    benzonatate (TESSALON) 200 MG capsule   Other Visit Diagnoses     Viral upper respiratory tract infection          URI with COPD exacerbation-it sounds most consistent with a viral illness.  I did give her some prednisone since she has noticed some increased cough, sputum production and wheezing.  Call if not better after the weekend.  Ladona Ridgel sent to pharmacy as well.  No orders of the defined types were placed in this encounter.   Meds ordered this encounter  Medications   predniSONE (DELTASONE) 20 MG tablet    Sig: Take 2 tablets (40 mg total) by mouth daily with breakfast.    Dispense:  10 tablet    Refill:  0   benzonatate (TESSALON) 200 MG capsule    Sig: Take 1 capsule (200 mg total) by mouth 3 (three) times daily as needed for cough.    Dispense:  30 capsule    Refill:  0     I discussed the assessment and treatment plan with the patient. The patient was provided an opportunity to ask questions and all were answered. The patient agreed with the plan and demonstrated an understanding of the instructions.   The patient was advised to call back or seek an in-person evaluation if the symptoms worsen or if the condition fails to improve as anticipated.   Beatrice Lecher, MD

## 2021-09-12 ENCOUNTER — Telehealth: Payer: Self-pay | Admitting: *Deleted

## 2021-09-12 ENCOUNTER — Encounter: Payer: Self-pay | Admitting: Family Medicine

## 2021-09-12 MED ORDER — AZITHROMYCIN 250 MG PO TABS: ORAL_TABLET | ORAL | 0 refills | Status: AC

## 2021-09-12 NOTE — Telephone Encounter (Signed)
Meds ordered this encounter  ?Medications  ? azithromycin (ZITHROMAX) 250 MG tablet  ?  Sig: 2 Ttabs PO on Day 1, then one a day x 4 days.  ?  Dispense:  6 tablet  ?  Refill:  0  ? ? ?

## 2021-09-12 NOTE — Telephone Encounter (Signed)
Pt was advised to call back if her sxs did not improve. She stated that she still has a cough that she feels has moved into her chest and continued nasal congestion.    She was given Prednisone and Tessalon.   Will fwd to Dr. Madilyn Fireman for advice.

## 2021-09-13 DIAGNOSIS — E538 Deficiency of other specified B group vitamins: Secondary | ICD-10-CM | POA: Diagnosis not present

## 2021-09-13 NOTE — Telephone Encounter (Signed)
Patient advised through MyChart.

## 2021-09-26 ENCOUNTER — Other Ambulatory Visit: Payer: Self-pay | Admitting: Physician Assistant

## 2021-09-26 DIAGNOSIS — J0140 Acute pansinusitis, unspecified: Secondary | ICD-10-CM

## 2021-10-03 DIAGNOSIS — E1142 Type 2 diabetes mellitus with diabetic polyneuropathy: Secondary | ICD-10-CM | POA: Diagnosis not present

## 2021-10-03 DIAGNOSIS — E1151 Type 2 diabetes mellitus with diabetic peripheral angiopathy without gangrene: Secondary | ICD-10-CM | POA: Diagnosis not present

## 2021-10-03 DIAGNOSIS — L84 Corns and callosities: Secondary | ICD-10-CM | POA: Diagnosis not present

## 2021-10-03 DIAGNOSIS — M21612 Bunion of left foot: Secondary | ICD-10-CM | POA: Diagnosis not present

## 2021-10-03 DIAGNOSIS — M21611 Bunion of right foot: Secondary | ICD-10-CM | POA: Diagnosis not present

## 2021-10-04 ENCOUNTER — Telehealth: Payer: Self-pay | Admitting: Physician Assistant

## 2021-10-04 DIAGNOSIS — G894 Chronic pain syndrome: Secondary | ICD-10-CM

## 2021-10-04 DIAGNOSIS — R52 Pain, unspecified: Secondary | ICD-10-CM

## 2021-10-04 MED ORDER — OXYCODONE HCL 15 MG PO TABS: 15.0000 mg | ORAL_TABLET | Freq: Four times a day (QID) | ORAL | 0 refills | Status: DC

## 2021-10-04 NOTE — Telephone Encounter (Signed)
Needs follow up appt for further refills.

## 2021-10-04 NOTE — Telephone Encounter (Signed)
Pt is requesting a refill of Oxycodone '15mg'$  to be sent to the [facility] in [place].

## 2021-10-04 NOTE — Telephone Encounter (Signed)
..  PDMP reviewed during this encounter. No concerns.  Last follow up 07/12/2021  Needs appt Sent one month.

## 2021-10-04 NOTE — Telephone Encounter (Signed)
Patient has a f/u scheduled on 10/26/21. AMUCK

## 2021-10-06 DIAGNOSIS — M25561 Pain in right knee: Secondary | ICD-10-CM | POA: Diagnosis not present

## 2021-10-06 DIAGNOSIS — R251 Tremor, unspecified: Secondary | ICD-10-CM | POA: Diagnosis not present

## 2021-10-06 DIAGNOSIS — R413 Other amnesia: Secondary | ICD-10-CM | POA: Diagnosis not present

## 2021-10-06 DIAGNOSIS — M25562 Pain in left knee: Secondary | ICD-10-CM | POA: Diagnosis not present

## 2021-10-06 DIAGNOSIS — R262 Difficulty in walking, not elsewhere classified: Secondary | ICD-10-CM | POA: Diagnosis not present

## 2021-10-06 DIAGNOSIS — R2689 Other abnormalities of gait and mobility: Secondary | ICD-10-CM | POA: Diagnosis not present

## 2021-10-09 ENCOUNTER — Other Ambulatory Visit: Payer: Self-pay | Admitting: Physician Assistant

## 2021-10-09 DIAGNOSIS — M25512 Pain in left shoulder: Secondary | ICD-10-CM

## 2021-10-10 DIAGNOSIS — M25561 Pain in right knee: Secondary | ICD-10-CM | POA: Diagnosis not present

## 2021-10-10 DIAGNOSIS — R6889 Other general symptoms and signs: Secondary | ICD-10-CM | POA: Diagnosis not present

## 2021-10-10 DIAGNOSIS — R2689 Other abnormalities of gait and mobility: Secondary | ICD-10-CM | POA: Diagnosis not present

## 2021-10-10 DIAGNOSIS — M25562 Pain in left knee: Secondary | ICD-10-CM | POA: Diagnosis not present

## 2021-10-10 DIAGNOSIS — R262 Difficulty in walking, not elsewhere classified: Secondary | ICD-10-CM | POA: Diagnosis not present

## 2021-10-13 DIAGNOSIS — H35033 Hypertensive retinopathy, bilateral: Secondary | ICD-10-CM | POA: Diagnosis not present

## 2021-10-13 DIAGNOSIS — H353133 Nonexudative age-related macular degeneration, bilateral, advanced atrophic without subfoveal involvement: Secondary | ICD-10-CM | POA: Diagnosis not present

## 2021-10-13 DIAGNOSIS — H43811 Vitreous degeneration, right eye: Secondary | ICD-10-CM | POA: Diagnosis not present

## 2021-10-13 DIAGNOSIS — H35372 Puckering of macula, left eye: Secondary | ICD-10-CM | POA: Diagnosis not present

## 2021-10-18 ENCOUNTER — Other Ambulatory Visit: Payer: Self-pay | Admitting: Physician Assistant

## 2021-10-18 DIAGNOSIS — R110 Nausea: Secondary | ICD-10-CM

## 2021-10-18 DIAGNOSIS — E538 Deficiency of other specified B group vitamins: Secondary | ICD-10-CM | POA: Diagnosis not present

## 2021-10-26 ENCOUNTER — Ambulatory Visit (INDEPENDENT_AMBULATORY_CARE_PROVIDER_SITE_OTHER): Payer: Medicare Other | Admitting: Physician Assistant

## 2021-10-26 ENCOUNTER — Encounter: Payer: Self-pay | Admitting: Physician Assistant

## 2021-10-26 VITALS — BP 112/45 | HR 76 | Ht 65.0 in | Wt 229.0 lb

## 2021-10-26 DIAGNOSIS — E7849 Other hyperlipidemia: Secondary | ICD-10-CM | POA: Diagnosis not present

## 2021-10-26 DIAGNOSIS — Z87891 Personal history of nicotine dependence: Secondary | ICD-10-CM | POA: Diagnosis not present

## 2021-10-26 DIAGNOSIS — R0902 Hypoxemia: Secondary | ICD-10-CM | POA: Insufficient documentation

## 2021-10-26 DIAGNOSIS — R0602 Shortness of breath: Secondary | ICD-10-CM

## 2021-10-26 DIAGNOSIS — R0989 Other specified symptoms and signs involving the circulatory and respiratory systems: Secondary | ICD-10-CM | POA: Insufficient documentation

## 2021-10-26 DIAGNOSIS — J411 Mucopurulent chronic bronchitis: Secondary | ICD-10-CM

## 2021-10-26 DIAGNOSIS — E119 Type 2 diabetes mellitus without complications: Secondary | ICD-10-CM | POA: Diagnosis not present

## 2021-10-26 LAB — POCT GLYCOSYLATED HEMOGLOBIN (HGB A1C): HbA1c, POC (controlled diabetic range): 5.7 % (ref 0.0–7.0)

## 2021-10-26 NOTE — Patient Instructions (Addendum)
Get labs  Get CT of chest We will order oxygen to use with any exertion

## 2021-10-26 NOTE — Progress Notes (Unsigned)
Established Patient Office Visit  Subjective   Patient ID: Sarah Wright, female    DOB: [birth year]  Age: 76 y.o. MRN: [id number]  Chief Complaint  Patient presents with   Diabetes    HPI Pt is a 76 yo obese female with COPD, T2DM, Dyslipidemia, Chronic venous insuffiencey, DDD with chronic pain who presents to the clinic for medication refills and 3 month follow up.   Pt does report more SOB with any exertion over the last 6 months. At rest she feels much better but getting up and walking across the room will get her short of breath. She admits she has been doing less and less things because of this. She denies any lower extermity swelling. She does not have to take the lasix very often. She does sleep at night elevated. She has a dry cough. She has COPD and on Trelegy. She is using her rescue inhaler and does help but having to use it multiple times a day. Her mother did have CHF. Last echo was 2021 and EF looked good. She does not have cardiologist or pulmonologist. Former smoker.   She is not checking her sugars regularly. She is taking ozempic weekly. No concerns. No hypoglycemic events. No CP, palpitations or headaches. No open sores or wounds.   Pt has chronic pain and on pain contract.     Patient Active Problem List   Diagnosis Date Noted   SOB (shortness of breath) on exertion 10/26/2021   Hypoxia 10/26/2021   Lung crackles 10/26/2021   Pain management 07/26/2021   Callus of toe 07/15/2021   Bilateral impacted cerumen 06/14/2021   Orthostatic hypotension 03/22/2021   Severe episode of recurrent major depressive disorder, without psychotic features (HCC) 08/20/2020   Excessive crying 08/20/2020   Right ankle pain 01/20/2020   COPD exacerbation (HCC) 08/14/2019   RLS (restless legs syndrome) 08/12/2018   Skin abrasion 08/01/2017   Leukocytes in urine 08/01/2017   Dysuria 08/01/2017   Panic attack 08/01/2017   Chronic pain syndrome 05/06/2017   Microalbuminuria  01/30/2017   DDD (degenerative disc disease), cervical 12/29/2016   Numbness around mouth 09/08/2016   Balance problem 08/13/2016   Left shoulder pain 08/13/2016   Right shoulder injury 01/27/2016   Psoriasis 09/23/2015   Memory loss 07/29/2015   Thyroid nodule 06/22/2015   Sensorineural hearing loss of both ears 01/26/2015   Dizziness 01/04/2015   Morbid obesity (HCC) 10/07/2014   Primary osteoarthritis of both knees 07/21/2014   H/O colonoscopy 04/02/2014   Thyroid disease 04/02/2014   Cataract 04/02/2014   Type 2 diabetes mellitus (HCC) 04/02/2014   Chronic venous insufficiency 03/27/2014   Lumbar spondylosis 03/17/2014   Aortic atherosclerosis (HCC) 03/17/2014   Anxiety and depression 03/16/2014   Dyslipidemia, goal LDL below 70 03/16/2014   COLD (chronic obstructive lung disease) (HCC) 03/16/2014   Insomnia 03/16/2014   Esophageal reflux 03/16/2014   Bilateral hand pain 03/16/2014   DDD (degenerative disc disease), lumbar 03/16/2014   Past Medical History:  Diagnosis Date   Diabetes mellitus without complication (HCC)    Hyperlipidemia    Thyroid disease    Family History  Problem Relation Age of Onset   Depression Mother    Heart attack Father    Diabetes Father    Uterine cancer Other    Pancreatic cancer Other    Hypertension Other        parents   Allergies  Allergen Reactions   Molnupiravir Nausea Only    headache  Sulfa Antibiotics     Patient had reaction as a child; does not know what type of reaction      ROS See HPI.    Objective:     BP (!) 112/45   Pulse 76   Ht $R'5\' 5"'mC$  (1.651 m)   Wt 229 lb (103.9 kg)   SpO2 96%   BMI 38.11 kg/m  BP Readings from Last 3 Encounters:  10/26/21 (!) 112/45  07/26/21 (!) 143/66  07/12/21 118/63   Wt Readings from Last 3 Encounters:  10/26/21 229 lb (103.9 kg)  07/26/21 229 lb (103.9 kg)  07/12/21 234 lb (106.1 kg)      Physical Exam Constitutional:      Appearance: She is obese.      Comments: Pale appearance  HENT:     Head: Normocephalic.  Cardiovascular:     Rate and Rhythm: Normal rate and regular rhythm.     Pulses: Normal pulses.  Pulmonary:     Comments: At rest normal respirations with any exertion or ambulation breathing is quickly very heavy. Bilateral lower and mid lung crackles seems to be a little worse on the left. No wheezing or rhonchi. Musculoskeletal:     Right lower leg: No edema.     Left lower leg: No edema.  Neurological:     General: No focal deficit present.     Mental Status: She is alert and oriented to person, place, and time.  Psychiatric:        Mood and Affect: Mood normal.       .. Results for orders placed or performed in visit on 10/26/21  COMPLETE METABOLIC PANEL WITH GFR  Result Value Ref Range   Glucose, Bld 104 (H) 65 - 99 mg/dL   BUN 17 7 - 25 mg/dL   Creat 1.01 (H) 0.60 - 1.00 mg/dL   eGFR 57 (L) > OR = 60 mL/min/1.64m2   BUN/Creatinine Ratio 17 6 - 22 (calc)   Sodium 140 135 - 146 mmol/L   Potassium 4.0 3.5 - 5.3 mmol/L   Chloride 106 98 - 110 mmol/L   CO2 24 20 - 32 mmol/L   Calcium 8.8 8.6 - 10.4 mg/dL   Total Protein 6.3 6.1 - 8.1 g/dL   Albumin 3.7 3.6 - 5.1 g/dL   Globulin 2.6 1.9 - 3.7 g/dL (calc)   AG Ratio 1.4 1.0 - 2.5 (calc)   Total Bilirubin 0.3 0.2 - 1.2 mg/dL   Alkaline phosphatase (APISO) 127 37 - 153 U/L   AST 18 10 - 35 U/L   ALT 15 6 - 29 U/L  POCT glycosylated hemoglobin (Hb A1C)  Result Value Ref Range   Hemoglobin A1C     HbA1c POC (<> result, manual entry)     HbA1c, POC (prediabetic range)     HbA1c, POC (controlled diabetic range) 5.7 0.0 - 7.0 %     Assessment & Plan:  [name] KitchenMarland KitchenLuanne was seen today for diabetes.  Diagnoses and all orders for this visit:  Type 2 diabetes mellitus without complication, without long-term current use of insulin (HCC) -     POCT glycosylated hemoglobin (Hb A1C) -     Semaglutide, 1 MG/DOSE, (OZEMPIC, 1 MG/DOSE,) 4 MG/3ML SOPN; Inject 1 mg into the  skin once a week. Needs labs/appt  Mucopurulent chronic bronchitis (HCC) -     COMPLETE METABOLIC PANEL WITH GFR -     Brain natriuretic peptide -     For home use only DME oxygen -  CT Chest W Contrast; Future  SOB (shortness of breath) on exertion -     COMPLETE METABOLIC PANEL WITH GFR -     Brain natriuretic peptide -     For home use only DME oxygen -     CT Chest W Contrast; Future  Lung crackles -     COMPLETE METABOLIC PANEL WITH GFR -     Brain natriuretic peptide -     For home use only DME oxygen -     CT Chest W Contrast; Future  Hypoxia -     For home use only DME oxygen -     CT Chest W Contrast; Future  Other hyperlipidemia -     atorvastatin (LIPITOR) 20 MG tablet; TAKE 1 TABLET BY MOUTH EVERY DAY .  Former smoker -     CT Chest W Contrast; Future   A1C looks great.  Continue on ozempic. BP to goal.  On statin.LDL to goal. Up to date foot and eye exam. Covid vaccine x3 Flu and pneumonia UTD Follow up in 3 months  Concerned with SOB with exertion  6 min Walk TEST At room air at rest 93 percent Ambulating dropped to 88 at 1 minute and 30 seconds, dropped to 79 at 2 minutes 30 seconds At rest room air she quickly went back up to 96 percent Order placed for portable O2.   Last echo 2021 and EF looked good.  BNP ordered. Will consider recheck.   Will get chest CT due to abnormal auscultation and worsening SOB      [name], PA-C

## 2021-10-27 ENCOUNTER — Telehealth: Payer: Self-pay | Admitting: Neurology

## 2021-10-27 ENCOUNTER — Other Ambulatory Visit: Payer: Self-pay | Admitting: Physician Assistant

## 2021-10-27 DIAGNOSIS — F419 Anxiety disorder, unspecified: Secondary | ICD-10-CM

## 2021-10-27 DIAGNOSIS — F332 Major depressive disorder, recurrent severe without psychotic features: Secondary | ICD-10-CM

## 2021-10-27 LAB — COMPLETE METABOLIC PANEL WITH GFR
AG Ratio: 1.4 (calc) (ref 1.0–2.5)
ALT: 15 U/L (ref 6–29)
AST: 18 U/L (ref 10–35)
Albumin: 3.7 g/dL (ref 3.6–5.1)
Alkaline phosphatase (APISO): 127 U/L (ref 37–153)
BUN/Creatinine Ratio: 17 (calc) (ref 6–22)
BUN: 17 mg/dL (ref 7–25)
CO2: 24 mmol/L (ref 20–32)
Calcium: 8.8 mg/dL (ref 8.6–10.4)
Chloride: 106 mmol/L (ref 98–110)
Creat: 1.01 mg/dL — ABNORMAL HIGH (ref 0.60–1.00)
Globulin: 2.6 g/dL (calc) (ref 1.9–3.7)
Glucose, Bld: 104 mg/dL — ABNORMAL HIGH (ref 65–99)
Potassium: 4 mmol/L (ref 3.5–5.3)
Sodium: 140 mmol/L (ref 135–146)
Total Bilirubin: 0.3 mg/dL (ref 0.2–1.2)
Total Protein: 6.3 g/dL (ref 6.1–8.1)
eGFR: 57 mL/min/{1.73_m2} — ABNORMAL LOW (ref >=60)

## 2021-10-27 LAB — BRAIN NATRIURETIC PEPTIDE: Brain Natriuretic Peptide: 101 pg/mL — ABNORMAL HIGH (ref <100)

## 2021-10-27 MED ORDER — ATORVASTATIN CALCIUM 20 MG PO TABS: ORAL_TABLET | ORAL | 1 refills | Status: DC

## 2021-10-27 MED ORDER — OZEMPIC (1 MG/DOSE) 4 MG/3ML ~~LOC~~ SOPN: 1.0000 mg | PEN_INJECTOR | SUBCUTANEOUS | 0 refills | Status: DC

## 2021-10-27 NOTE — Telephone Encounter (Signed)
Last clinic note, order, and demographics for oxygen sent to [facility] at [phone] with confirmation received.

## 2021-10-27 NOTE — Progress Notes (Signed)
Kidney function stable and improved a little.  BNP just a little elevated.

## 2021-10-28 ENCOUNTER — Encounter: Payer: Self-pay | Admitting: Physician Assistant

## 2021-10-28 NOTE — Telephone Encounter (Signed)
[facility] requires walking with oxygen.   Sent [facility] at [phone] with confirmation received.

## 2021-11-03 DIAGNOSIS — R262 Difficulty in walking, not elsewhere classified: Secondary | ICD-10-CM | POA: Diagnosis not present

## 2021-11-03 DIAGNOSIS — M25561 Pain in right knee: Secondary | ICD-10-CM | POA: Diagnosis not present

## 2021-11-03 DIAGNOSIS — Z9181 History of falling: Secondary | ICD-10-CM | POA: Diagnosis not present

## 2021-11-03 DIAGNOSIS — M25562 Pain in left knee: Secondary | ICD-10-CM | POA: Diagnosis not present

## 2021-11-03 DIAGNOSIS — R2689 Other abnormalities of gait and mobility: Secondary | ICD-10-CM | POA: Diagnosis not present

## 2021-11-07 ENCOUNTER — Ambulatory Visit (INDEPENDENT_AMBULATORY_CARE_PROVIDER_SITE_OTHER): Payer: Medicare Other

## 2021-11-07 DIAGNOSIS — R0989 Other specified symptoms and signs involving the circulatory and respiratory systems: Secondary | ICD-10-CM | POA: Diagnosis not present

## 2021-11-07 DIAGNOSIS — Z87891 Personal history of nicotine dependence: Secondary | ICD-10-CM

## 2021-11-07 DIAGNOSIS — J411 Mucopurulent chronic bronchitis: Secondary | ICD-10-CM | POA: Diagnosis not present

## 2021-11-07 DIAGNOSIS — R0602 Shortness of breath: Secondary | ICD-10-CM | POA: Diagnosis not present

## 2021-11-07 DIAGNOSIS — R0600 Dyspnea, unspecified: Secondary | ICD-10-CM | POA: Diagnosis not present

## 2021-11-07 DIAGNOSIS — R0902 Hypoxemia: Secondary | ICD-10-CM | POA: Diagnosis not present

## 2021-11-07 MED ORDER — IOHEXOL 300 MG/ML  SOLN
100.0000 mL | Freq: Once | INTRAMUSCULAR | Status: AC | PRN
  Administered 2021-11-07: 75 mL via INTRAVENOUS

## 2021-11-08 ENCOUNTER — Encounter: Payer: Self-pay | Admitting: Physician Assistant

## 2021-11-08 DIAGNOSIS — E278 Other specified disorders of adrenal gland: Secondary | ICD-10-CM | POA: Insufficient documentation

## 2021-11-08 NOTE — Progress Notes (Signed)
Some small amt of right lung fluid accumulation with inter lung markings in both lungs(likely from scarring).   You do have fatty liver. There is a 4.2cm lesion around adrenal gland. Suggested CT of abdomen to further look at. Are you ok with this?

## 2021-11-11 ENCOUNTER — Telehealth (INDEPENDENT_AMBULATORY_CARE_PROVIDER_SITE_OTHER): Payer: Medicare Other | Admitting: Physician Assistant

## 2021-11-11 DIAGNOSIS — K760 Fatty (change of) liver, not elsewhere classified: Secondary | ICD-10-CM

## 2021-11-11 DIAGNOSIS — E278 Other specified disorders of adrenal gland: Secondary | ICD-10-CM | POA: Diagnosis not present

## 2021-11-11 DIAGNOSIS — R0902 Hypoxemia: Secondary | ICD-10-CM

## 2021-11-11 DIAGNOSIS — Z6841 Body Mass Index (BMI) 40.0 and over, adult: Secondary | ICD-10-CM | POA: Diagnosis not present

## 2021-11-11 DIAGNOSIS — R0602 Shortness of breath: Secondary | ICD-10-CM | POA: Diagnosis not present

## 2021-11-11 DIAGNOSIS — J411 Mucopurulent chronic bronchitis: Secondary | ICD-10-CM | POA: Diagnosis not present

## 2021-11-11 DIAGNOSIS — N2889 Other specified disorders of kidney and ureter: Secondary | ICD-10-CM | POA: Diagnosis not present

## 2021-11-11 DIAGNOSIS — E1142 Type 2 diabetes mellitus with diabetic polyneuropathy: Secondary | ICD-10-CM | POA: Diagnosis not present

## 2021-11-11 DIAGNOSIS — E66813 Obesity, class 3: Secondary | ICD-10-CM

## 2021-11-11 MED ORDER — SEMAGLUTIDE (2 MG/DOSE) 8 MG/3ML ~~LOC~~ SOPN: 2.0000 mg | PEN_INJECTOR | SUBCUTANEOUS | 0 refills | Status: DC

## 2021-11-11 NOTE — Progress Notes (Signed)
..Virtual Visit via Video Note  I connected with [name] on 11/12/21 at 11:30 AM EDT by a video enabled telemedicine application and verified that I am speaking with the correct person using two identifiers.  Location: Patient: home Provider: clinic  .[name] KitchenParticipating in visit:  Patient: Sarah Wright Provider: [name] PA-C   I discussed the limitations of evaluation and management by telemedicine and the availability of in person appointments. The patient expressed understanding and agreed to proceed.  History of Present Illness:  Pt would like to go over results and discuss plan for next steps.    FINDINGS: Cardiovascular: There are scattered calcifications in thoracic aorta. There is homogeneous enhancement in thoracic aorta. There are no intraluminal filling defects in central pulmonary artery branches. Small pericardial effusion is present.   Mediastinum/Nodes: No significant lymphadenopathy is seen. Right lobe of thyroid is not seen. There is inhomogeneous attenuation in enlarged left lobe of thyroid.   Lungs/Pleura: There are linear densities in right middle lobe. There is slight prominence of interstitial markings in both lungs, more so in both apices. There is no focal pulmonary consolidation. There is minimal right pleural effusion. There is no pneumothorax.   Upper Abdomen: There is fatty infiltration liver. Surgical clips are seen in gallbladder fossa. There is 4.2 cm smooth marginated lesion inseparable from left adrenal. Density measurements range up to 48 Hounsfield units. Inferior portion of this lesion is not included in the images limiting evaluation.   Musculoskeletal: Small sclerotic densities seen in the lower endplate of body of T5 vertebra, possibly benign bone island. Less likely possibility would be sclerotic metastatic disease. Degenerative changes are noted in thoracic spine and visualized upper lumbar spine.   IMPRESSION: There is no focal  pulmonary consolidation. There are no signs of alveolar pulmonary edema. There is minimal right pleural effusion.   There is slight prominence of interstitial markings in both lungs, especially in the right apical region suggesting possible scarring.   Fatty liver. There is 4.2 cm smooth marginated lesion inseparable from the left adrenal. This may suggest a left adrenal mass or lesion arising from the upper pole of left kidney. Follow-up multiphasic CT of abdomen and pelvis may be considered for further characterization of this lesion.   Other findings as described in the body of the report.   Observations/Objective: No acute distress Normal breathing On 2L of O2.    Assessment and Plan: [name] KitchenMarland KitchenDiagnoses and all orders for this visit:  Mucopurulent chronic bronchitis ([place]) -     Ambulatory referral to Pulmonology  Fatty liver disease, nonalcoholic -     Semaglutide, 2 MG/DOSE, 8 MG/3ML SOPN; Inject 2 mg as directed once a week.  SOB (shortness of breath) on exertion -     Ambulatory referral to Pulmonology  Hypoxia -     Ambulatory referral to Pulmonology  Left adrenal mass (HCC)  Class 3 severe obesity due to excess calories with serious comorbidity and body mass index (BMI) of 40.0 to 44.9 in adult (HCC) -     Semaglutide, 2 MG/DOSE, 8 MG/3ML SOPN; Inject 2 mg as directed once a week.  Type 2 diabetes mellitus with diabetic polyneuropathy, without long-term current use of insulin (HCC) -     Semaglutide, 2 MG/DOSE, 8 MG/3ML SOPN; Inject 2 mg as directed once a week.  Other specified disorders of kidney and ureter -     CT ABDOMEN W WO CONTRAST; Future   Discussed CT of chest Will order CT of abdomen to look at  4.2 renal/adrenal mass Pt continues to have worsening SOB on 2L of O2 Will make referral to pulmonology Discussed fatty liver disease and the role diet plays in this Increased ozempic to '2mg'$  weekly Keep DM follow up    Follow Up Instructions:    I  discussed the assessment and treatment plan with the patient. The patient was provided an opportunity to ask questions and all were answered. The patient agreed with the plan and demonstrated an understanding of the instructions.   The patient was advised to call back or seek an in-person evaluation if the symptoms worsen or if the condition fails to improve as anticipated.     [name], PA-C

## 2021-11-12 ENCOUNTER — Encounter: Payer: Self-pay | Admitting: Physician Assistant

## 2021-11-14 DIAGNOSIS — Z723 Lack of physical exercise: Secondary | ICD-10-CM | POA: Diagnosis not present

## 2021-11-14 DIAGNOSIS — M25561 Pain in right knee: Secondary | ICD-10-CM | POA: Diagnosis not present

## 2021-11-14 DIAGNOSIS — M25562 Pain in left knee: Secondary | ICD-10-CM | POA: Diagnosis not present

## 2021-11-14 DIAGNOSIS — M6281 Muscle weakness (generalized): Secondary | ICD-10-CM | POA: Diagnosis not present

## 2021-11-14 DIAGNOSIS — R2689 Other abnormalities of gait and mobility: Secondary | ICD-10-CM | POA: Diagnosis not present

## 2021-11-14 DIAGNOSIS — R293 Abnormal posture: Secondary | ICD-10-CM | POA: Diagnosis not present

## 2021-11-17 ENCOUNTER — Other Ambulatory Visit: Payer: Self-pay | Admitting: Physician Assistant

## 2021-11-17 DIAGNOSIS — G894 Chronic pain syndrome: Secondary | ICD-10-CM

## 2021-11-17 DIAGNOSIS — R52 Pain, unspecified: Secondary | ICD-10-CM

## 2021-11-17 DIAGNOSIS — Z882 Allergy status to sulfonamides status: Secondary | ICD-10-CM | POA: Diagnosis not present

## 2021-11-17 DIAGNOSIS — Z833 Family history of diabetes mellitus: Secondary | ICD-10-CM | POA: Diagnosis not present

## 2021-11-17 NOTE — Telephone Encounter (Signed)
Pt lvm.  She is requesting a refill on her Oxycodone.

## 2021-11-18 MED ORDER — OXYCODONE HCL 15 MG PO TABS: 15.0000 mg | ORAL_TABLET | Freq: Three times a day (TID) | ORAL | 0 refills | Status: DC

## 2021-11-18 NOTE — Telephone Encounter (Signed)
Based on her filling history on the PDMP, she is only taking about 60 tabs per month.  The prescription and needs to really reflect how she is taking the medication as closely as possible.  This could be a risk for complications if she were ever be hospitalized and they really started giving it to her 4 times a day every day.  I did refill the prescription but it is written for 3 times a day which is 90 tabs which again based on her feeling history should still last her a little more than a month but I would highly encourage she and Sarah Wright have a discussion about this at her next follow-up appointment.  She will need to schedule an appointment for any further refills from our clinic.  She does not have a urine drug screen on file and needs an appointment specifically for chronic pain management.

## 2021-11-18 NOTE — Telephone Encounter (Signed)
Not sure why it is not on her med list. She is on a pain contact with Luvenia Starch updated in April. Taking Oxycodone 15 mg 1 tablet QID.  Last written 07/26/2021 RX pended.

## 2021-11-18 NOTE — Telephone Encounter (Signed)
LVM for patient to call back to get an appt scheduled with PCP for any further refills on medication. AMUCK

## 2021-11-18 NOTE — Telephone Encounter (Signed)
Please call appt with appt.

## 2021-11-18 NOTE — Telephone Encounter (Signed)
I do not see this medication on her medication list

## 2021-11-21 ENCOUNTER — Ambulatory Visit (INDEPENDENT_AMBULATORY_CARE_PROVIDER_SITE_OTHER): Payer: Medicare Other

## 2021-11-21 DIAGNOSIS — I700 Atherosclerosis of aorta: Secondary | ICD-10-CM | POA: Diagnosis not present

## 2021-11-21 DIAGNOSIS — N2889 Other specified disorders of kidney and ureter: Secondary | ICD-10-CM

## 2021-11-21 DIAGNOSIS — M5136 Other intervertebral disc degeneration, lumbar region: Secondary | ICD-10-CM | POA: Diagnosis not present

## 2021-11-21 DIAGNOSIS — Z9049 Acquired absence of other specified parts of digestive tract: Secondary | ICD-10-CM | POA: Diagnosis not present

## 2021-11-21 DIAGNOSIS — N200 Calculus of kidney: Secondary | ICD-10-CM | POA: Diagnosis not present

## 2021-11-21 MED ORDER — IOHEXOL 300 MG/ML  SOLN
100.0000 mL | Freq: Once | INTRAMUSCULAR | Status: AC | PRN
  Administered 2021-11-21: 100 mL via INTRAVENOUS

## 2021-11-22 DIAGNOSIS — E538 Deficiency of other specified B group vitamins: Secondary | ICD-10-CM | POA: Diagnosis not present

## 2021-11-25 ENCOUNTER — Telehealth: Payer: Self-pay | Admitting: Neurology

## 2021-11-25 ENCOUNTER — Encounter: Payer: Self-pay | Admitting: Physician Assistant

## 2021-11-25 ENCOUNTER — Other Ambulatory Visit: Payer: Self-pay | Admitting: Physician Assistant

## 2021-11-25 DIAGNOSIS — E278 Other specified disorders of adrenal gland: Secondary | ICD-10-CM

## 2021-11-25 DIAGNOSIS — F332 Major depressive disorder, recurrent severe without psychotic features: Secondary | ICD-10-CM

## 2021-11-25 DIAGNOSIS — G2581 Restless legs syndrome: Secondary | ICD-10-CM

## 2021-11-25 NOTE — Telephone Encounter (Signed)
Patient left vm, she is nervous about CT results and wants to see if someone will look at these and explain them to her. Please advise.

## 2021-11-25 NOTE — Telephone Encounter (Signed)
Patient made aware of results per message from [place]. Okay with Urology referral. Referral placed.

## 2021-11-28 NOTE — Progress Notes (Signed)
Bary Richard,   I was out of town but wanted to check in.   As you were updated from my partner. You do have an unknown mass on left adrenal gland. You don't have any past history of cancer. So I do think urology referral is a great idea. Do you have appt yet?

## 2021-12-04 ENCOUNTER — Other Ambulatory Visit: Payer: Self-pay | Admitting: Physician Assistant

## 2021-12-04 DIAGNOSIS — M25512 Pain in left shoulder: Secondary | ICD-10-CM

## 2021-12-04 DIAGNOSIS — F410 Panic disorder [episodic paroxysmal anxiety] without agoraphobia: Secondary | ICD-10-CM

## 2021-12-07 NOTE — Progress Notes (Unsigned)
Synopsis: Referred in August 2023 for chronic bronchitis  Subjective:   PATIENT ID: Sarah Wright GENDER: female DOB: 1946-04-13, MRN: [id number]   HPI  No chief complaint on file.   ***  Records from her July 2023 primary care office visit reviewed where she was referred for shortness of breath.  Past Medical History:  Diagnosis Date   Diabetes mellitus without complication ([place])    Hyperlipidemia    Thyroid disease      Family History  Problem Relation Age of Onset   Depression Mother    Heart attack Father    Diabetes Father    Uterine cancer Other    Pancreatic cancer Other    Hypertension Other        parents     Social History   Socioeconomic History   Marital status: Divorced    Spouse name: Not on file   Number of children: Not on file   Years of education: Not on file   Highest education level: Not on file  Occupational History   Not on file  Tobacco Use   Smoking status: Former    Types: Cigarettes    Quit date: 02/12/2014    Years since quitting: 7.8   Smokeless tobacco: Never  Substance and Sexual Activity   Alcohol use: No    Alcohol/week: 0.0 standard drinks of alcohol   Drug use: No   Sexual activity: Not on file  Other Topics Concern   Not on file  Social History Narrative   Not on file   Social Determinants of Health   Financial Resource Strain: Not on file  Food Insecurity: Not on file  Transportation Needs: Not on file  Physical Activity: Not on file  Stress: Not on file  Social Connections: Not on file  Intimate Partner Violence: Not on file     Allergies  Allergen Reactions   Molnupiravir Nausea Only    headache   Sulfa Antibiotics     Patient had reaction as a child; does not know what type of reaction     Outpatient Medications Prior to Visit  Medication Sig Dispense Refill   aspirin 81 MG tablet Take 81 mg by mouth daily.     atorvastatin (LIPITOR) 20 MG tablet TAKE 1 TABLET BY MOUTH EVERY DAY . 90 tablet 1    buPROPion (WELLBUTRIN XL) 150 MG 24 hr tablet TAKE 2 TABLETS BY MOUTH DAILY 180 tablet 1   cyclobenzaprine (FLEXERIL) 10 MG tablet TAKE ONE TABLET (10 MG DOSE) BY MOUTH 3 TIMES A DAY AS NEEDED FOR MUSCLE SPASMS FOR UP TO 30 DAYS.     donepezil (ARICEPT) 10 MG tablet TAKE 1 TABLET BY MOUTH EVERY DAY 90 tablet 3   DULoxetine (CYMBALTA) 60 MG capsule TAKE 1 CAPSULE BY MOUTH TWICE A DAY 180 capsule 1   fluticasone (FLONASE) 50 MCG/ACT nasal spray SPRAY 2 SPRAYS INTO EACH NOSTRIL EVERY DAY 48 mL 1   furosemide (LASIX) 20 MG tablet Take daily as needed for lower extremity swelling. Needs appointment for another refill 90 tablet 0   hydrOXYzine (ATARAX) 25 MG tablet TAKE 1 TABLET BY MOUTH AS NEEDED UP TO TWICE A DAY 180 tablet 1   ipratropium-albuterol (DUONEB) 0.5-2.5 (3) MG/3ML SOLN Take 3 mLs by nebulization every 4 (four) hours as needed. 360 mL 1   levalbuterol (XOPENEX HFA) 45 MCG/ACT inhaler INHALE 1 TO 2 PUFFS BY MOUTH EVERY 6 HOURS AS NEEDED FOR WHEEZE 15 each 5   meloxicam (MOBIC)  15 MG tablet TAKE 1 TABLET BY MOUTH EVERY DAY 90 tablet 0   montelukast (SINGULAIR) 10 MG tablet TAKE 1 TABLET BY MOUTH EVERYDAY AT BEDTIME 90 tablet 4   OLANZapine (ZYPREXA) 7.5 MG tablet TAKE 1 TABLET BY MOUTH EVERYDAY AT BEDTIME 90 tablet 0   ondansetron (ZOFRAN) 8 MG tablet TAKE 1 TABLET BY MOUTH EVERY 8 HOURS AS NEEDED FOR NAUSEA AND VOMITING 20 tablet 0   oxyCODONE (ROXICODONE) 15 MG immediate release tablet Take 1 tablet (15 mg total) by mouth in the morning, at noon, and at bedtime. 90 tablet 0   rOPINIRole (REQUIP) 2 MG tablet TAKE 1 TABLET BY MOUTH AT BEDTIME. 90 tablet 0   Semaglutide, 2 MG/DOSE, 8 MG/3ML SOPN Inject 2 mg as directed once a week. 9 mL 0   topiramate (TOPAMAX) 50 MG tablet TAKE 1 TABLET BY MOUTH TWICE A DAY 180 tablet 1   traZODone (DESYREL) 50 MG tablet Take 0.5-1 tablets (25-50 mg total) by mouth at bedtime as needed for sleep. 90 tablet 1   TRELEGY ELLIPTA 100-62.5-25 MCG/ACT AEPB INHALE 1  PUFF BY MOUTH EVERY DAY 60 each 3   No facility-administered medications prior to visit.    ROS    Objective:  Physical Exam   There were no vitals filed for this visit.  ***  CBC    Component Value Date/Time   WBC 8.4 07/12/2021 0000   RBC 4.69 07/12/2021 0000   HGB 12.0 07/12/2021 0000   HCT 38.2 07/12/2021 0000   PLT 225 07/12/2021 0000   MCV 81.4 07/12/2021 0000   MCH 25.6 (L) 07/12/2021 0000   MCHC 31.4 (L) 07/12/2021 0000   RDW 14.2 07/12/2021 0000   LYMPHSABS 1,218 07/12/2021 0000   MONOABS 0.7 06/17/2015 1554   EOSABS 336 07/12/2021 0000   BASOSABS 101 07/12/2021 0000     Chest imaging: July 2023 CT chest images independently reviewed showing upper lobe mild centrilobular emphysema, nonspecific interstitial changes in the bases, probable post inflammatory scarring versus mucous plug  PFT:  Labs: March 2023 CBC: Hemoglobin 12 g/dL, absolute eosinophil count 336 cells per microliter  Path:  Echo: 2021 TTE: LVEF 95%, grade 1 diastolic dysfunction, left atrium dilated, mild elevated PA pressure estimate, valves okay  Heart Catheterization:       Assessment & Plan:   No diagnosis found.  Discussion: ***    Current Outpatient Medications:    aspirin 81 MG tablet, Take 81 mg by mouth daily., Disp: , Rfl:    atorvastatin (LIPITOR) 20 MG tablet, TAKE 1 TABLET BY MOUTH EVERY DAY ., Disp: 90 tablet, Rfl: 1   buPROPion (WELLBUTRIN XL) 150 MG 24 hr tablet, TAKE 2 TABLETS BY MOUTH DAILY, Disp: 180 tablet, Rfl: 1   cyclobenzaprine (FLEXERIL) 10 MG tablet, TAKE ONE TABLET (10 MG DOSE) BY MOUTH 3 TIMES A DAY AS NEEDED FOR MUSCLE SPASMS FOR UP TO 30 DAYS., Disp: , Rfl:    donepezil (ARICEPT) 10 MG tablet, TAKE 1 TABLET BY MOUTH EVERY DAY, Disp: 90 tablet, Rfl: 3   DULoxetine (CYMBALTA) 60 MG capsule, TAKE 1 CAPSULE BY MOUTH TWICE A DAY, Disp: 180 capsule, Rfl: 1   fluticasone (FLONASE) 50 MCG/ACT nasal spray, SPRAY 2 SPRAYS INTO EACH NOSTRIL EVERY DAY,  Disp: 48 mL, Rfl: 1   furosemide (LASIX) 20 MG tablet, Take daily as needed for lower extremity swelling. Needs appointment for another refill, Disp: 90 tablet, Rfl: 0   hydrOXYzine (ATARAX) 25 MG tablet, TAKE 1 TABLET  BY MOUTH AS NEEDED UP TO TWICE A DAY, Disp: 180 tablet, Rfl: 1   ipratropium-albuterol (DUONEB) 0.5-2.5 (3) MG/3ML SOLN, Take 3 mLs by nebulization every 4 (four) hours as needed., Disp: 360 mL, Rfl: 1   levalbuterol (XOPENEX HFA) 45 MCG/ACT inhaler, INHALE 1 TO 2 PUFFS BY MOUTH EVERY 6 HOURS AS NEEDED FOR WHEEZE, Disp: 15 each, Rfl: 5   meloxicam (MOBIC) 15 MG tablet, TAKE 1 TABLET BY MOUTH EVERY DAY, Disp: 90 tablet, Rfl: 0   montelukast (SINGULAIR) 10 MG tablet, TAKE 1 TABLET BY MOUTH EVERYDAY AT BEDTIME, Disp: 90 tablet, Rfl: 4   OLANZapine (ZYPREXA) 7.5 MG tablet, TAKE 1 TABLET BY MOUTH EVERYDAY AT BEDTIME, Disp: 90 tablet, Rfl: 0   ondansetron (ZOFRAN) 8 MG tablet, TAKE 1 TABLET BY MOUTH EVERY 8 HOURS AS NEEDED FOR NAUSEA AND VOMITING, Disp: 20 tablet, Rfl: 0   oxyCODONE (ROXICODONE) 15 MG immediate release tablet, Take 1 tablet (15 mg total) by mouth in the morning, at noon, and at bedtime., Disp: 90 tablet, Rfl: 0   rOPINIRole (REQUIP) 2 MG tablet, TAKE 1 TABLET BY MOUTH AT BEDTIME., Disp: 90 tablet, Rfl: 0   Semaglutide, 2 MG/DOSE, 8 MG/3ML SOPN, Inject 2 mg as directed once a week., Disp: 9 mL, Rfl: 0   topiramate (TOPAMAX) 50 MG tablet, TAKE 1 TABLET BY MOUTH TWICE A DAY, Disp: 180 tablet, Rfl: 1   traZODone (DESYREL) 50 MG tablet, Take 0.5-1 tablets (25-50 mg total) by mouth at bedtime as needed for sleep., Disp: 90 tablet, Rfl: 1   TRELEGY ELLIPTA 100-62.5-25 MCG/ACT AEPB, INHALE 1 PUFF BY MOUTH EVERY DAY, Disp: 60 each, Rfl: 3

## 2021-12-08 ENCOUNTER — Ambulatory Visit (INDEPENDENT_AMBULATORY_CARE_PROVIDER_SITE_OTHER): Payer: Medicare Other | Admitting: Pulmonary Disease

## 2021-12-08 ENCOUNTER — Encounter: Payer: Self-pay | Admitting: Pulmonary Disease

## 2021-12-08 VITALS — BP 118/84 | HR 71 | Ht 65.0 in | Wt 230.0 lb

## 2021-12-08 DIAGNOSIS — R5381 Other malaise: Secondary | ICD-10-CM

## 2021-12-08 DIAGNOSIS — J449 Chronic obstructive pulmonary disease, unspecified: Secondary | ICD-10-CM

## 2021-12-08 DIAGNOSIS — J9611 Chronic respiratory failure with hypoxia: Secondary | ICD-10-CM | POA: Diagnosis not present

## 2021-12-08 NOTE — Patient Instructions (Signed)
COPD, Gold group E with elevated eosinophil count. Spirometry test today Trial of Breztri 2 puffs twice a day with a spacer While taking the Breztri stop taking Trelegy Keep using albuterol as you are doing Get a flu shot when they are available Practice good hand hygiene Stay active We will refer you to pulmonary rehab Because you have about 1 exacerbation a year we will continue to monitor this.  However should you have more frequent exacerbations we can talk about more treatment options.  Chronic respiratory failure with hypoxemia: Keep using 2 L of oxygen continuously  Physical deconditioning: Pulmonary rehab referral  Abnormal CT scan of the chest: The CT scan he had recently showed emphysema, bronchial wall thickening, mucous plugging.  There are some inflammatory changes in the bases likely related to her prior infection.  At this time no further imaging is needed unless symptoms change.  Follow up in 3 months or sooner if needed

## 2021-12-09 ENCOUNTER — Telehealth: Payer: Self-pay | Admitting: Pulmonary Disease

## 2021-12-09 MED ORDER — BREZTRI AEROSPHERE 160-9-4.8 MCG/ACT IN AERO: 2.0000 | INHALATION_SPRAY | Freq: Two times a day (BID) | RESPIRATORY_TRACT | 0 refills | Status: DC

## 2021-12-09 NOTE — Telephone Encounter (Signed)
Called patient and she states at visit yesterday Dr [place] Bells did not give her the "chamber" with the medication. That she got a sample but the chamber was missing. It was hard to understand. So I sent in one inhaler to her pharmacy. Nothing further needed

## 2021-12-12 DIAGNOSIS — L97512 Non-pressure chronic ulcer of other part of right foot with fat layer exposed: Secondary | ICD-10-CM | POA: Diagnosis not present

## 2021-12-12 DIAGNOSIS — L602 Onychogryphosis: Secondary | ICD-10-CM | POA: Diagnosis not present

## 2021-12-12 DIAGNOSIS — E1142 Type 2 diabetes mellitus with diabetic polyneuropathy: Secondary | ICD-10-CM | POA: Diagnosis not present

## 2021-12-14 ENCOUNTER — Encounter: Payer: Self-pay | Admitting: General Practice

## 2021-12-17 ENCOUNTER — Other Ambulatory Visit: Payer: Self-pay | Admitting: Physician Assistant

## 2021-12-17 DIAGNOSIS — E119 Type 2 diabetes mellitus without complications: Secondary | ICD-10-CM

## 2021-12-19 ENCOUNTER — Encounter: Payer: Self-pay | Admitting: Physician Assistant

## 2021-12-19 ENCOUNTER — Ambulatory Visit (INDEPENDENT_AMBULATORY_CARE_PROVIDER_SITE_OTHER): Payer: Medicare Other | Admitting: Physician Assistant

## 2021-12-19 VITALS — BP 113/41 | HR 72 | Ht 65.0 in | Wt 228.0 lb

## 2021-12-19 DIAGNOSIS — M25512 Pain in left shoulder: Secondary | ICD-10-CM | POA: Diagnosis not present

## 2021-12-19 DIAGNOSIS — R52 Pain, unspecified: Secondary | ICD-10-CM | POA: Diagnosis not present

## 2021-12-19 DIAGNOSIS — R2689 Other abnormalities of gait and mobility: Secondary | ICD-10-CM

## 2021-12-19 DIAGNOSIS — M170 Bilateral primary osteoarthritis of knee: Secondary | ICD-10-CM | POA: Diagnosis not present

## 2021-12-19 DIAGNOSIS — W19XXXA Unspecified fall, initial encounter: Secondary | ICD-10-CM

## 2021-12-19 DIAGNOSIS — G894 Chronic pain syndrome: Secondary | ICD-10-CM | POA: Diagnosis not present

## 2021-12-19 DIAGNOSIS — R110 Nausea: Secondary | ICD-10-CM | POA: Diagnosis not present

## 2021-12-19 MED ORDER — ONDANSETRON HCL 8 MG PO TABS
ORAL_TABLET | ORAL | 2 refills | Status: DC
Start: 2021-12-19 — End: 2022-01-20

## 2021-12-19 MED ORDER — OXYCODONE HCL 15 MG PO TABS: 15.0000 mg | ORAL_TABLET | Freq: Three times a day (TID) | ORAL | 0 refills | Status: DC

## 2021-12-19 NOTE — Progress Notes (Addendum)
Established Patient Office Visit  Subjective   Patient ID: Sarah Wright, female    DOB: [birth year]  Age: 76 y.o. MRN: [id number]  Chief Complaint  Patient presents with   Follow-up    HPI Pt is a 76 yo obese female with chronic pain, T2DM, COPD, thyroid disease, MDD, anxiety, insomnia who presents to the clinic for medication refills. She is accompanied by daughter.   She needs her oxycodone refilled. No concerns. Per patient she still has pain but manageable with oxycodone.   She has had one fall in her bedroom due to loss of balance in last 3 months.  She continues to have balance issues due to her chronic bilateral knee pain.   Pt is having some left shoulder pain for the past week. No trauma to this shoulder. Hx of bursitis. Worse with movement and using it or laying on it. Not tried anything to make better.  Active Ambulatory Problems    Diagnosis Date Noted   Anxiety and depression 03/16/2014   Dyslipidemia, goal LDL below 70 03/16/2014   COLD (chronic obstructive lung disease) ([place]) 03/16/2014   Insomnia 03/16/2014   Esophageal reflux 03/16/2014   Bilateral hand pain 03/16/2014   DDD (degenerative disc disease), lumbar 03/16/2014   Lumbar spondylosis 03/17/2014   Aortic atherosclerosis ([place]) 03/17/2014   Chronic venous insufficiency 03/27/2014   H/O colonoscopy 04/02/2014   Thyroid disease 04/02/2014   Cataract 04/02/2014   Type 2 diabetes mellitus ([place]) 04/02/2014   Primary osteoarthritis of both knees 07/21/2014   Class 3 severe obesity due to excess calories with serious comorbidity and body mass index (BMI) of 40.0 to 44.9 in adult [facility]) 10/07/2014   Dizziness 01/04/2015   Sensorineural hearing loss of both ears 01/26/2015   Thyroid nodule 06/22/2015   Memory loss 07/29/2015   Psoriasis 09/23/2015   Right shoulder injury 01/27/2016   Balance problem 08/13/2016   Acute pain of left shoulder 08/13/2016   Numbness around mouth 09/08/2016   DDD (degenerative  disc disease), cervical 12/29/2016   Microalbuminuria 01/30/2017   Chronic pain syndrome 05/06/2017   Skin abrasion 08/01/2017   Leukocytes in urine 08/01/2017   Dysuria 08/01/2017   Panic attack 08/01/2017   RLS (restless legs syndrome) 08/12/2018   COPD exacerbation ([place]) 08/14/2019   Right ankle pain 01/20/2020   Severe episode of recurrent major depressive disorder, without psychotic features ([place]) 08/20/2020   Excessive crying 08/20/2020   Orthostatic hypotension 03/22/2021   Bilateral impacted cerumen 06/14/2021   Callus of toe 07/15/2021   Pain management 07/26/2021   SOB (shortness of breath) on exertion 10/26/2021   Hypoxia 10/26/2021   Lung crackles 10/26/2021   Left adrenal mass ([place]) 11/08/2021   Fatty liver disease, nonalcoholic 14/48/1856   Fall 12/19/2021   Resolved Ambulatory Problems    Diagnosis Date Noted   No Resolved Ambulatory Problems   Past Medical History:  Diagnosis Date   Diabetes mellitus without complication ([place])    Hyperlipidemia      ROS See HPI.    Objective:     BP (!) 113/41   Pulse 72   Ht '5\' 5"'$  (1.651 m)   Wt 228 lb (103.4 kg)   SpO2 96%   BMI 37.94 kg/m  BP Readings from Last 3 Encounters:  12/19/21 (!) 113/41  12/08/21 118/84  10/26/21 (!) 112/45   Wt Readings from Last 3 Encounters:  12/19/21 228 lb (103.4 kg)  12/08/21 230 lb (104.3 kg)  10/26/21 229 lb (103.9 kg)  Physical Exam Vitals reviewed.  Constitutional:      Appearance: Normal appearance. She is obese.  HENT:     Head: Normocephalic.  Cardiovascular:     Rate and Rhythm: Normal rate and regular rhythm.  Pulmonary:     Effort: Pulmonary effort is normal.     Breath sounds: Normal breath sounds.     Comments: On 2L of O2  Musculoskeletal:     Right lower leg: No edema.     Left lower leg: No edema.     Comments: Left shoulder:  Abduction to 90 degrees Tenderness to palpation over anterior and lateral shoulder Strength 5/5   Neurological:      General: No focal deficit present.     Mental Status: She is alert.  Psychiatric:        Mood and Affect: Mood normal.     Comments: Flat affect    Subacromial Shoulder Injection Procedure Note  Pre-operative Diagnosis: left shoulder pain  Post-operative Diagnosis: same  Indications: bursitis   Procedure Details   Verbal consent was obtained for the procedure. The shoulder was prepped with Betadine and the skin was anesthetized. A 27 gauge needle was advanced into the subacromial space through posterior approach without difficulty  The space was then injected with 9 ml 1% lidocaine and 1 ml of  depo medrol '40mg'$  . The injection site was cleansed with isopropyl alcohol and a dressing was applied.  Complications:  None; patient tolerated the procedure well.       Assessment & Plan:  [name] KitchenMarland KitchenLuanne was seen today for follow-up.  Diagnoses and all orders for this visit:  Acute pain of left shoulder  Pain management -     oxyCODONE (ROXICODONE) 15 MG immediate release tablet; Take 1 tablet (15 mg total) by mouth in the morning, at noon, and at bedtime.  Chronic pain syndrome -     oxyCODONE (ROXICODONE) 15 MG immediate release tablet; Take 1 tablet (15 mg total) by mouth in the morning, at noon, and at bedtime.  Nausea -     ondansetron (ZOFRAN) 8 MG tablet; TAKE 1 TABLET BY MOUTH EVERY 8 HOURS AS NEEDED FOR NAUSEA AND VOMITING  Fall, initial encounter -     Ambulatory referral to [place] problem -     Ambulatory referral to Home Health   Pain contract UTD UDS UTD .[name] KitchenPDMP reviewed during this encounter. No concerns.  Sent refills  Discussed possible trying to decrease pain medication in the future to decrease fall risk  Discussed exercising regularly and resistance training for upper arms to use walker better  Shoulder sounds like bursisits Injection given today Use ice and start exercises Follow up with Dr. Darene Lamer as needed  COPD improved some with  breztri from trelegy On continuous O2 Not started walking yet  Referral to home PT for balance issues and recent fall as well as high future fall risk due to chronic pain due to lumbar/cervical/bilateral knee OA.    Return in about 2 months (around 02/18/2022) for diabetic follow up.    [name], PA-C

## 2021-12-20 DIAGNOSIS — E278 Other specified disorders of adrenal gland: Secondary | ICD-10-CM | POA: Diagnosis not present

## 2021-12-20 DIAGNOSIS — J449 Chronic obstructive pulmonary disease, unspecified: Secondary | ICD-10-CM | POA: Diagnosis not present

## 2021-12-20 DIAGNOSIS — Z9981 Dependence on supplemental oxygen: Secondary | ICD-10-CM | POA: Diagnosis not present

## 2021-12-21 DIAGNOSIS — M2042 Other hammer toe(s) (acquired), left foot: Secondary | ICD-10-CM | POA: Diagnosis not present

## 2021-12-21 DIAGNOSIS — E1142 Type 2 diabetes mellitus with diabetic polyneuropathy: Secondary | ICD-10-CM | POA: Diagnosis not present

## 2021-12-21 DIAGNOSIS — L97511 Non-pressure chronic ulcer of other part of right foot limited to breakdown of skin: Secondary | ICD-10-CM | POA: Diagnosis not present

## 2021-12-21 DIAGNOSIS — M2041 Other hammer toe(s) (acquired), right foot: Secondary | ICD-10-CM | POA: Diagnosis not present

## 2021-12-22 DIAGNOSIS — I999 Unspecified disorder of circulatory system: Secondary | ICD-10-CM | POA: Diagnosis not present

## 2021-12-22 DIAGNOSIS — F0670 Mild neurocognitive disorder due to known physiological condition without behavioral disturbance: Secondary | ICD-10-CM | POA: Diagnosis not present

## 2021-12-22 DIAGNOSIS — F332 Major depressive disorder, recurrent severe without psychotic features: Secondary | ICD-10-CM | POA: Diagnosis not present

## 2021-12-23 ENCOUNTER — Telehealth: Payer: Self-pay | Admitting: Physician Assistant

## 2021-12-23 NOTE — Telephone Encounter (Signed)
I'm not sure which form they are talking about, I haven't received anything by fax. Did they leave a phone number to follow up with her?

## 2021-12-23 NOTE — Telephone Encounter (Signed)
Tina with [facility] called. She is following up on the ammended face to face sheet.  She states that Medicare will not cover services unless the diagnosis matches the reason why pt needs PT. Fax [phone].

## 2021-12-28 DIAGNOSIS — M170 Bilateral primary osteoarthritis of knee: Secondary | ICD-10-CM | POA: Insufficient documentation

## 2021-12-30 ENCOUNTER — Encounter: Payer: Self-pay | Admitting: Physician Assistant

## 2021-12-30 ENCOUNTER — Ambulatory Visit (INDEPENDENT_AMBULATORY_CARE_PROVIDER_SITE_OTHER): Payer: Medicare Other | Admitting: Physician Assistant

## 2021-12-30 ENCOUNTER — Telehealth: Payer: Medicare Other | Admitting: Physician Assistant

## 2021-12-30 DIAGNOSIS — Z0000 Encounter for general adult medical examination without abnormal findings: Secondary | ICD-10-CM

## 2021-12-30 DIAGNOSIS — Z780 Asymptomatic menopausal state: Secondary | ICD-10-CM

## 2021-12-30 DIAGNOSIS — R3989 Other symptoms and signs involving the genitourinary system: Secondary | ICD-10-CM

## 2021-12-30 MED ORDER — CEPHALEXIN 500 MG PO CAPS: 500.0000 mg | ORAL_CAPSULE | Freq: Two times a day (BID) | ORAL | 0 refills | Status: DC

## 2021-12-30 MED ORDER — PHENAZOPYRIDINE HCL 100 MG PO TABS: 100.0000 mg | ORAL_TABLET | Freq: Three times a day (TID) | ORAL | 0 refills | Status: DC | PRN

## 2021-12-30 NOTE — Progress Notes (Signed)
Virtual Visit Consent   Sarah Wright, you are scheduled for a virtual visit with a [facility] provider today. Just as with appointments in the office, your consent must be obtained to participate. Your consent will be active for this visit and any virtual visit you may have with one of our providers in the next 365 days. If you have a MyChart account, a copy of this consent can be sent to you electronically.  As this is a virtual visit, video technology does not allow for your provider to perform a traditional examination. This may limit your provider's ability to fully assess your condition. If your provider identifies any concerns that need to be evaluated in person or the need to arrange testing (such as labs, EKG, etc.), we will make arrangements to do so. Although advances in technology are sophisticated, we cannot ensure that it will always work on either your end or our end. If the connection with a video visit is poor, the visit may have to be switched to a telephone visit. With either a video or telephone visit, we are not always able to ensure that we have a secure connection.  By engaging in this virtual visit, you consent to the provision of healthcare and authorize for your insurance to be billed (if applicable) for the services provided during this visit. Depending on your insurance coverage, you may receive a charge related to this service.  I need to obtain your verbal consent now. Are you willing to proceed with your visit today? Sarah Wright has provided verbal consent on 12/30/2021 for a virtual visit (video or telephone). Mar Daring, PA-C  Date: 12/30/2021 3:22 PM  Virtual Visit via Video Note   IMar Daring, connected with  Sarah Wright  ([id number], August 06, 1945) on 12/30/21 at  3:15 PM EDT by a video-enabled telemedicine application and verified that I am speaking with the correct person using two identifiers.  Location: Patient: Virtual Visit Location Patient:  Home Provider: Virtual Visit Location Provider: Home Office   I discussed the limitations of evaluation and management by telemedicine and the availability of in person appointments. The patient expressed understanding and agreed to proceed.    History of Present Illness: Sarah Wright is a 76 y.o. who identifies as a female who was assigned female at birth, and is being seen today for possible UTI.  HPI: Urinary Tract Infection  This is a new problem. The current episode started today. The problem occurs every urination. The problem has been gradually worsening. The quality of the pain is described as aching and burning. The pain is mild. There has been no fever. She is Not sexually active. There is No history of pyelonephritis. Associated symptoms include frequency, hesitancy, nausea (mild) and urgency. Pertinent negatives include no chills, discharge, flank pain, hematuria or vomiting. Associated symptoms comments: Spasms. She has tried increased fluids for the symptoms. The treatment provided no relief. Her past medical history is significant for recurrent UTIs.      Problems:  Patient Active Problem List   Diagnosis Date Noted   Bilateral primary osteoarthritis of knee 12/28/2021   Fall 12/19/2021   Fatty liver disease, nonalcoholic 42/59/5638   Left adrenal mass ([place]) 11/08/2021   SOB (shortness of breath) on exertion 10/26/2021   Hypoxia 10/26/2021   Lung crackles 10/26/2021   Pain management 07/26/2021   Callus of toe 07/15/2021   Bilateral impacted cerumen 06/14/2021   Orthostatic hypotension 03/22/2021   Severe episode of recurrent major depressive  disorder, without psychotic features ([place]) 08/20/2020   Excessive crying 08/20/2020   Right ankle pain 01/20/2020   COPD exacerbation ([place]) 08/14/2019   RLS (restless legs syndrome) 08/12/2018   Skin abrasion 08/01/2017   Leukocytes in urine 08/01/2017   Dysuria 08/01/2017   Panic attack 08/01/2017   Chronic pain syndrome  05/06/2017   Microalbuminuria 01/30/2017   DDD (degenerative disc disease), cervical 12/29/2016   Numbness around mouth 09/08/2016   Balance problem 08/13/2016   Acute pain of left shoulder 08/13/2016   Right shoulder injury 01/27/2016   Psoriasis 09/23/2015   Memory loss 07/29/2015   Thyroid nodule 06/22/2015   Sensorineural hearing loss of both ears 01/26/2015   Dizziness 01/04/2015   Class 3 severe obesity due to excess calories with serious comorbidity and body mass index (BMI) of 40.0 to 44.9 in adult [facility]) 10/07/2014   Primary osteoarthritis of both knees 07/21/2014   H/O colonoscopy 04/02/2014   Thyroid disease 04/02/2014   Cataract 04/02/2014   Type 2 diabetes mellitus ([place]) 04/02/2014   Chronic venous insufficiency 03/27/2014   Lumbar spondylosis 03/17/2014   Aortic atherosclerosis ([place]) 03/17/2014   Anxiety and depression 03/16/2014   Dyslipidemia, goal LDL below 70 03/16/2014   COLD (chronic obstructive lung disease) ([place]) 03/16/2014   Insomnia 03/16/2014   Esophageal reflux 03/16/2014   Bilateral hand pain 03/16/2014   DDD (degenerative disc disease), lumbar 03/16/2014    Allergies:  Allergies  Allergen Reactions   Molnupiravir Nausea Only    headache   Sulfa Antibiotics     Patient had reaction as a child; does not know what type of reaction   Medications:  Current Outpatient Medications:    cephALEXin (KEFLEX) 500 MG capsule, Take 1 capsule (500 mg total) by mouth 2 (two) times daily., Disp: 14 capsule, Rfl: 0   phenazopyridine (PYRIDIUM) 100 MG tablet, Take 1 tablet (100 mg total) by mouth 3 (three) times daily as needed for pain., Disp: 10 tablet, Rfl: 0   aspirin 81 MG tablet, Take 81 mg by mouth daily., Disp: , Rfl:    atorvastatin (LIPITOR) 20 MG tablet, TAKE 1 TABLET BY MOUTH EVERY DAY ., Disp: 90 tablet, Rfl: 1   Budeson-Glycopyrrol-Formoterol (BREZTRI AEROSPHERE) 160-9-4.8 MCG/ACT AERO, Inhale 2 puffs into the lungs in the morning and at bedtime.,  Disp: 10.7 g, Rfl: 0   buPROPion (WELLBUTRIN XL) 150 MG 24 hr tablet, TAKE 2 TABLETS BY MOUTH DAILY, Disp: 180 tablet, Rfl: 1   cyclobenzaprine (FLEXERIL) 10 MG tablet, TAKE ONE TABLET (10 MG DOSE) BY MOUTH 3 TIMES A DAY AS NEEDED FOR MUSCLE SPASMS FOR UP TO 30 DAYS., Disp: , Rfl:    donepezil (ARICEPT) 10 MG tablet, TAKE 1 TABLET BY MOUTH EVERY DAY, Disp: 90 tablet, Rfl: 3   DULoxetine (CYMBALTA) 60 MG capsule, TAKE 1 CAPSULE BY MOUTH TWICE A DAY, Disp: 180 capsule, Rfl: 1   fluticasone (FLONASE) 50 MCG/ACT nasal spray, SPRAY 2 SPRAYS INTO EACH NOSTRIL EVERY DAY, Disp: 48 mL, Rfl: 1   fluticasone (FLONASE) 50 MCG/ACT nasal spray, Place into both nostrils., Disp: , Rfl:    furosemide (LASIX) 20 MG tablet, Take daily as needed for lower extremity swelling. Needs appointment for another refill, Disp: 90 tablet, Rfl: 0   hydrOXYzine (ATARAX) 25 MG tablet, TAKE 1 TABLET BY MOUTH AS NEEDED UP TO TWICE A DAY, Disp: 180 tablet, Rfl: 1   ipratropium-albuterol (DUONEB) 0.5-2.5 (3) MG/3ML SOLN, Take 3 mLs by nebulization every 4 (four) hours as needed., Disp:  360 mL, Rfl: 1   levalbuterol (XOPENEX HFA) 45 MCG/ACT inhaler, INHALE 1 TO 2 PUFFS BY MOUTH EVERY 6 HOURS AS NEEDED FOR WHEEZE, Disp: 15 each, Rfl: 5   meloxicam (MOBIC) 15 MG tablet, TAKE 1 TABLET BY MOUTH EVERY DAY, Disp: 90 tablet, Rfl: 0   montelukast (SINGULAIR) 10 MG tablet, TAKE 1 TABLET BY MOUTH EVERYDAY AT BEDTIME, Disp: 90 tablet, Rfl: 4   OLANZapine (ZYPREXA) 7.5 MG tablet, TAKE 1 TABLET BY MOUTH EVERYDAY AT BEDTIME, Disp: 90 tablet, Rfl: 0   ondansetron (ZOFRAN) 8 MG tablet, TAKE 1 TABLET BY MOUTH EVERY 8 HOURS AS NEEDED FOR NAUSEA AND VOMITING, Disp: 20 tablet, Rfl: 2   ondansetron (ZOFRAN) 8 MG tablet, Take by mouth., Disp: , Rfl:    oxyCODONE (ROXICODONE) 15 MG immediate release tablet, Take 1 tablet (15 mg total) by mouth in the morning, at noon, and at bedtime., Disp: 90 tablet, Rfl: 0   rOPINIRole (REQUIP) 2 MG tablet, TAKE 1 TABLET BY  MOUTH AT BEDTIME., Disp: 90 tablet, Rfl: 0   Semaglutide, 2 MG/DOSE, 8 MG/3ML SOPN, Inject 2 mg as directed once a week., Disp: 9 mL, Rfl: 0   topiramate (TOPAMAX) 50 MG tablet, TAKE 1 TABLET BY MOUTH TWICE A DAY, Disp: 180 tablet, Rfl: 1   traZODone (DESYREL) 50 MG tablet, Take 0.5-1 tablets (25-50 mg total) by mouth at bedtime as needed for sleep., Disp: 90 tablet, Rfl: 1  Observations/Objective: Patient is well-developed, well-nourished in no acute distress.  Resting comfortably at home.  Head is normocephalic, atraumatic.  No labored breathing. Speech is clear and coherent with logical content.  Patient is alert and oriented at baseline.    Assessment and Plan: 1. Suspected UTI - cephALEXin (KEFLEX) 500 MG capsule; Take 1 capsule (500 mg total) by mouth 2 (two) times daily.  Dispense: 14 capsule; Refill: 0 - phenazopyridine (PYRIDIUM) 100 MG tablet; Take 1 tablet (100 mg total) by mouth 3 (three) times daily as needed for pain.  Dispense: 10 tablet; Refill: 0  - Worsening symptoms.  - Will treat empirically with Keflex and Pyridium - May use AZO for bladder spasms - Continue to push fluids.  - Seek in person evaluation for urine culture if symptoms do not improve or if they worsen.     Follow Up Instructions: I discussed the assessment and treatment plan with the patient. The patient was provided an opportunity to ask questions and all were answered. The patient agreed with the plan and demonstrated an understanding of the instructions.  A copy of instructions were sent to the patient via MyChart unless otherwise noted below.    The patient was advised to call back or seek an in-person evaluation if the symptoms worsen or if the condition fails to improve as anticipated.  Time:  I spent 8 minutes with the patient via telehealth technology discussing the above problems/concerns.    Mar Daring, PA-C

## 2021-12-30 NOTE — Patient Instructions (Addendum)
[facility] Maintenance Summary and Written Plan of Care  [name] ,  Thank you for allowing me to perform your Medicare Annual Wellness Visit and for your ongoing commitment to your health.   Health Maintenance & Immunization History Health Maintenance  Topic Date Due   Diabetic kidney evaluation - Urine ACR  12/31/2021 (Originally 10/27/2020)   COVID-19 Vaccine (4 - Mixed Product risk series) 01/04/2022 (Originally 09/03/2020)   Zoster Vaccines- Shingrix (2 of 2) 03/21/2022 (Originally 01/16/2017)   INFLUENZA VACCINE  07/23/2022 (Originally 11/22/2021)   HEMOGLOBIN A1C  04/28/2022   OPHTHALMOLOGY EXAM  07/06/2022   FOOT EXAM  07/13/2022   Diabetic kidney evaluation - GFR measurement  10/27/2022   TETANUS/TDAP  10/03/2025   Pneumonia Vaccine 50+ Years old  Completed   DEXA SCAN  Completed   Hepatitis C Screening  Completed   HPV VACCINES  Aged Out   COLONOSCOPY (Pts 45-16yr Insurance coverage will need to be confirmed)  Discontinued   Immunization History  Administered Date(s) Administered   Fluad Quad(high Dose 65+) 03/02/2021   Influenza, High Dose Seasonal PF 12/13/2016, 02/08/2018, 12/17/2018   Influenza-Unspecified 12/21/2014, 01/09/2016, 12/13/2016, 02/08/2018, 01/21/2020   Moderna Sars-Covid-2 Vaccination 07/09/2020   PFIZER(Purple Top)SARS-COV-2 Vaccination 05/20/2019, 06/17/2019   PPD Test 09/23/2014   Pneumococcal Conjugate-13 01/12/2014   Pneumococcal Polysaccharide-23 04/29/2003, 03/28/2016   Tdap 10/04/2015   Zoster Recombinat (Shingrix) 11/21/2016   Zoster, Live 05/22/2012    These are the patient goals that we discussed:  Goals Addressed               This Visit's Progress     Patient Stated (pt-stated)        12/30/2021 AWV Goal: Exercise for General Health  Patient will verbalize understanding of the benefits of increased physical activity: Exercising regularly is important. It will improve your overall fitness,  flexibility, and endurance. Regular exercise also will improve your overall health. It can help you control your weight, reduce stress, and improve your bone density. Over the next year, patient will increase physical activity as tolerated with a goal of at least 150 minutes of moderate physical activity per week.  You can tell that you are exercising at a moderate intensity if your heart starts beating faster and you start breathing faster but can still hold a conversation. Moderate-intensity exercise ideas include: Walking 1 mile (1.6 km) in about 15 minutes Biking Hiking Golfing Dancing Water aerobics Patient will verbalize understanding of everyday activities that increase physical activity by providing examples like the following: Yard work, such as: PSales promotion account executiveGardening Washing windows or floors Patient will be able to explain general safety guidelines for exercising:  Before you start a new exercise program, talk with your health care provider. Do not exercise so much that you hurt yourself, feel dizzy, or get very short of breath. Wear comfortable clothes and wear shoes with good support. Drink plenty of water while you exercise to prevent dehydration or heat stroke. Work out until your breathing and your heartbeat get faster.          This is a list of Health Maintenance Items that are overdue or due now: Influenza vaccine Shingrix vaccine-2nd dose.  Orders/Referrals Placed Today: Orders Placed This Encounter  Procedures   DEXAScan    Standing Status:   Future    Standing Expiration Date:   12/31/2022    Scheduling Instructions:  Please call patient to schedule.    Order Specific Question:   Reason for exam:    Answer:   Post menopausal    Order Specific Question:   Preferred imaging location?    Answer:   [facility]    (Contact our referral department at  [phone] if you have not spoken with someone about your referral appointment within the next 5 days)    Follow-up Plan Follow-up with Donella Stade, PA-C as planned Schedule the shingrix vaccine -2nd dose and flu shot at the pharmacy. Bone density referral has been sent.  Medicare wellness visit in one year. Patient will access AVS on my chart.      Health Maintenance, Female Adopting a healthy lifestyle and getting preventive care are important in promoting health and wellness. Ask your health care provider about: The right schedule for you to have regular tests and exams. Things you can do on your own to prevent diseases and keep yourself healthy. What should I know about diet, weight, and exercise? Eat a healthy diet  Eat a diet that includes plenty of vegetables, fruits, low-fat dairy products, and lean protein. Do not eat a lot of foods that are high in solid fats, added sugars, or sodium. Maintain a healthy weight Body mass index (BMI) is used to identify weight problems. It estimates body fat based on height and weight. Your health care provider can help determine your BMI and help you achieve or maintain a healthy weight. Get regular exercise Get regular exercise. This is one of the most important things you can do for your health. Most adults should: Exercise for at least 150 minutes each week. The exercise should increase your heart rate and make you sweat (moderate-intensity exercise). Do strengthening exercises at least twice a week. This is in addition to the moderate-intensity exercise. Spend less time sitting. Even light physical activity can be beneficial. Watch cholesterol and blood lipids Have your blood tested for lipids and cholesterol at 78 years of age, then have this test every 5 years. Have your cholesterol levels checked more often if: Your lipid or cholesterol levels are high. You are older than 78 years of age. You are at high risk for heart  disease. What should I know about cancer screening? Depending on your health history and family history, you may need to have cancer screening at various ages. This may include screening for: Breast cancer. Cervical cancer. Colorectal cancer. Skin cancer. Lung cancer. What should I know about heart disease, diabetes, and high blood pressure? Blood pressure and heart disease High blood pressure causes heart disease and increases the risk of stroke. This is more likely to develop in people who have high blood pressure readings or are overweight. Have your blood pressure checked: Every 3-5 years if you are 70-65 years of age. Every year if you are 28 years old or older. Diabetes Have regular diabetes screenings. This checks your fasting blood sugar level. Have the screening done: Once every three years after age 49 if you are at a normal weight and have a low risk for diabetes. More often and at a younger age if you are overweight or have a high risk for diabetes. What should I know about preventing infection? Hepatitis B If you have a higher risk for hepatitis B, you should be screened for this virus. Talk with your health care provider to find out if you are at risk for hepatitis B infection. Hepatitis C Testing is recommended for: Everyone born  from [place] through 1965. Anyone with known risk factors for hepatitis C. Sexually transmitted infections (STIs) Get screened for STIs, including gonorrhea and chlamydia, if: You are sexually active and are younger than 78 years of age. You are older than 78 years of age and your health care provider tells you that you are at risk for this type of infection. Your sexual activity has changed since you were last screened, and you are at increased risk for chlamydia or gonorrhea. Ask your health care provider if you are at risk. Ask your health care provider about whether you are at high risk for HIV. Your health care provider may recommend a  prescription medicine to help prevent HIV infection. If you choose to take medicine to prevent HIV, you should first get tested for HIV. You should then be tested every 3 months for as long as you are taking the medicine. Pregnancy If you are about to stop having your period (premenopausal) and you may become pregnant, seek counseling before you get pregnant. Take 400 to 800 micrograms (mcg) of folic acid every day if you become pregnant. Ask for birth control (contraception) if you want to prevent pregnancy. Osteoporosis and menopause Osteoporosis is a disease in which the bones lose minerals and strength with aging. This can result in bone fractures. If you are 59 years old or older, or if you are at risk for osteoporosis and fractures, ask your health care provider if you should: Be screened for bone loss. Take a calcium or vitamin D supplement to lower your risk of fractures. Be given hormone replacement therapy (HRT) to treat symptoms of menopause. Follow these instructions at home: Alcohol use Do not drink alcohol if: Your health care provider tells you not to drink. You are pregnant, may be pregnant, or are planning to become pregnant. If you drink alcohol: Limit how much you have to: 0-1 drink a day. Know how much alcohol is in your drink. In the [place] one drink equals one 12 oz bottle of beer (355 mL), one 5 oz glass of wine (148 mL), or one 1 oz glass of hard liquor (44 mL). Lifestyle Do not use any products that contain nicotine or tobacco. These products include cigarettes, chewing tobacco, and vaping devices, such as e-cigarettes. If you need help quitting, ask your health care provider. Do not use street drugs. Do not share needles. Ask your health care provider for help if you need support or information about quitting drugs. General instructions Schedule regular health, dental, and eye exams. Stay current with your vaccines. Tell your health care provider if: You often  feel depressed. You have ever been abused or do not feel safe at home. Summary Adopting a healthy lifestyle and getting preventive care are important in promoting health and wellness. Follow your health care provider's instructions about healthy diet, exercising, and getting tested or screened for diseases. Follow your health care provider's instructions on monitoring your cholesterol and blood pressure. This information is not intended to replace advice given to you by your health care provider. Make sure you discuss any questions you have with your health care provider. Document Revised: 08/30/2020 Document Reviewed: 08/30/2020 Elsevier Patient Education  [place].

## 2021-12-30 NOTE — Patient Instructions (Signed)
[name], thank you for joining Mar Daring, PA-C for today's virtual visit.  While this provider is not your primary care provider (PCP), if your PCP is located in our provider database this encounter information will be shared with them immediately following your visit.  Consent: (Patient) [name] provided verbal consent for this virtual visit at the beginning of the encounter.  Current Medications:  Current Outpatient Medications:    cephALEXin (KEFLEX) 500 MG capsule, Take 1 capsule (500 mg total) by mouth 2 (two) times daily., Disp: 14 capsule, Rfl: 0   phenazopyridine (PYRIDIUM) 100 MG tablet, Take 1 tablet (100 mg total) by mouth 3 (three) times daily as needed for pain., Disp: 10 tablet, Rfl: 0   aspirin 81 MG tablet, Take 81 mg by mouth daily., Disp: , Rfl:    atorvastatin (LIPITOR) 20 MG tablet, TAKE 1 TABLET BY MOUTH EVERY DAY ., Disp: 90 tablet, Rfl: 1   Budeson-Glycopyrrol-Formoterol (BREZTRI AEROSPHERE) 160-9-4.8 MCG/ACT AERO, Inhale 2 puffs into the lungs in the morning and at bedtime., Disp: 10.7 g, Rfl: 0   buPROPion (WELLBUTRIN XL) 150 MG 24 hr tablet, TAKE 2 TABLETS BY MOUTH DAILY, Disp: 180 tablet, Rfl: 1   cyclobenzaprine (FLEXERIL) 10 MG tablet, TAKE ONE TABLET (10 MG DOSE) BY MOUTH 3 TIMES A DAY AS NEEDED FOR MUSCLE SPASMS FOR UP TO 30 DAYS., Disp: , Rfl:    donepezil (ARICEPT) 10 MG tablet, TAKE 1 TABLET BY MOUTH EVERY DAY, Disp: 90 tablet, Rfl: 3   DULoxetine (CYMBALTA) 60 MG capsule, TAKE 1 CAPSULE BY MOUTH TWICE A DAY, Disp: 180 capsule, Rfl: 1   fluticasone (FLONASE) 50 MCG/ACT nasal spray, SPRAY 2 SPRAYS INTO EACH NOSTRIL EVERY DAY, Disp: 48 mL, Rfl: 1   fluticasone (FLONASE) 50 MCG/ACT nasal spray, Place into both nostrils., Disp: , Rfl:    furosemide (LASIX) 20 MG tablet, Take daily as needed for lower extremity swelling. Needs appointment for another refill, Disp: 90 tablet, Rfl: 0   hydrOXYzine (ATARAX) 25 MG tablet, TAKE 1 TABLET BY MOUTH AS  NEEDED UP TO TWICE A DAY, Disp: 180 tablet, Rfl: 1   ipratropium-albuterol (DUONEB) 0.5-2.5 (3) MG/3ML SOLN, Take 3 mLs by nebulization every 4 (four) hours as needed., Disp: 360 mL, Rfl: 1   levalbuterol (XOPENEX HFA) 45 MCG/ACT inhaler, INHALE 1 TO 2 PUFFS BY MOUTH EVERY 6 HOURS AS NEEDED FOR WHEEZE, Disp: 15 each, Rfl: 5   meloxicam (MOBIC) 15 MG tablet, TAKE 1 TABLET BY MOUTH EVERY DAY, Disp: 90 tablet, Rfl: 0   montelukast (SINGULAIR) 10 MG tablet, TAKE 1 TABLET BY MOUTH EVERYDAY AT BEDTIME, Disp: 90 tablet, Rfl: 4   OLANZapine (ZYPREXA) 7.5 MG tablet, TAKE 1 TABLET BY MOUTH EVERYDAY AT BEDTIME, Disp: 90 tablet, Rfl: 0   ondansetron (ZOFRAN) 8 MG tablet, TAKE 1 TABLET BY MOUTH EVERY 8 HOURS AS NEEDED FOR NAUSEA AND VOMITING, Disp: 20 tablet, Rfl: 2   ondansetron (ZOFRAN) 8 MG tablet, Take by mouth., Disp: , Rfl:    oxyCODONE (ROXICODONE) 15 MG immediate release tablet, Take 1 tablet (15 mg total) by mouth in the morning, at noon, and at bedtime., Disp: 90 tablet, Rfl: 0   rOPINIRole (REQUIP) 2 MG tablet, TAKE 1 TABLET BY MOUTH AT BEDTIME., Disp: 90 tablet, Rfl: 0   Semaglutide, 2 MG/DOSE, 8 MG/3ML SOPN, Inject 2 mg as directed once a week., Disp: 9 mL, Rfl: 0   topiramate (TOPAMAX) 50 MG tablet, TAKE 1 TABLET BY MOUTH TWICE A DAY,  Disp: 180 tablet, Rfl: 1   traZODone (DESYREL) 50 MG tablet, Take 0.5-1 tablets (25-50 mg total) by mouth at bedtime as needed for sleep., Disp: 90 tablet, Rfl: 1   Medications ordered in this encounter:  Meds ordered this encounter  Medications   cephALEXin (KEFLEX) 500 MG capsule    Sig: Take 1 capsule (500 mg total) by mouth 2 (two) times daily.    Dispense:  14 capsule    Refill:  0    Order Specific Question:   Supervising Provider    Answer:   Sabra Heck, BRIAN [[id number]]   phenazopyridine (PYRIDIUM) 100 MG tablet    Sig: Take 1 tablet (100 mg total) by mouth 3 (three) times daily as needed for pain.    Dispense:  10 tablet    Refill:  0    Order Specific  Question:   Supervising Provider    Answer:   Sabra Heck, BRIAN [[id number]]     *If you need refills on other medications prior to your next appointment, please contact your pharmacy*  Follow-Up: Call back or seek an in-person evaluation if the symptoms worsen or if the condition fails to improve as anticipated.  Other Instructions Urinary Tract Infection, Adult  A urinary tract infection (UTI) is an infection of any part of the urinary tract. The urinary tract includes the kidneys, ureters, bladder, and urethra. These organs make, store, and get rid of urine in the body. An upper UTI affects the ureters and kidneys. A lower UTI affects the bladder and urethra. What are the causes? Most urinary tract infections are caused by bacteria in your genital area around your urethra, where urine leaves your body. These bacteria grow and cause inflammation of your urinary tract. What increases the risk? You are more likely to develop this condition if: You have a urinary catheter that stays in place. You are not able to control when you urinate or have a bowel movement (incontinence). You are female and you: Use a spermicide or diaphragm for birth control. Have low estrogen levels. Are pregnant. You have certain genes that increase your risk. You are sexually active. You take antibiotic medicines. You have a condition that causes your flow of urine to slow down, such as: An enlarged prostate, if you are female. Blockage in your urethra. A kidney stone. A nerve condition that affects your bladder control (neurogenic bladder). Not getting enough to drink, or not urinating often. You have certain medical conditions, such as: Diabetes. A weak disease-fighting system (immunesystem). Sickle cell disease. Gout. Spinal cord injury. What are the signs or symptoms? Symptoms of this condition include: Needing to urinate right away (urgency). Frequent urination. This may include small amounts of urine each  time you urinate. Pain or burning with urination. Blood in the urine. Urine that smells bad or unusual. Trouble urinating. Cloudy urine. Vaginal discharge, if you are female. Pain in the abdomen or the lower back. You may also have: Vomiting or a decreased appetite. Confusion. Irritability or tiredness. A fever or chills. Diarrhea. The first symptom in older adults may be confusion. In some cases, they may not have any symptoms until the infection has worsened. How is this diagnosed? This condition is diagnosed based on your medical history and a physical exam. You may also have other tests, including: Urine tests. Blood tests. Tests for STIs (sexually transmitted infections). If you have had more than one UTI, a cystoscopy or imaging studies may be done to determine the cause of the infections. How  is this treated? Treatment for this condition includes: Antibiotic medicine. Over-the-counter medicines to treat discomfort. Drinking enough water to stay hydrated. If you have frequent infections or have other conditions such as a kidney stone, you may need to see a health care provider who specializes in the urinary tract (urologist). In rare cases, urinary tract infections can cause sepsis. Sepsis is a life-threatening condition that occurs when the body responds to an infection. Sepsis is treated in the hospital with IV antibiotics, fluids, and other medicines. Follow these instructions at home:  Medicines Take over-the-counter and prescription medicines only as told by your health care provider. If you were prescribed an antibiotic medicine, take it as told by your health care provider. Do not stop using the antibiotic even if you start to feel better. General instructions Make sure you: Empty your bladder often and completely. Do not hold urine for long periods of time. Empty your bladder after sex. Wipe from front to back after urinating or having a bowel movement if you are  female. Use each tissue only one time when you wipe. Drink enough fluid to keep your urine pale yellow. Keep all follow-up visits. This is important. Contact a health care provider if: Your symptoms do not get better after 1-2 days. Your symptoms go away and then return. Get help right away if: You have severe pain in your back or your lower abdomen. You have a fever or chills. You have nausea or vomiting. Summary A urinary tract infection (UTI) is an infection of any part of the urinary tract, which includes the kidneys, ureters, bladder, and urethra. Most urinary tract infections are caused by bacteria in your genital area. Treatment for this condition often includes antibiotic medicines. If you were prescribed an antibiotic medicine, take it as told by your health care provider. Do not stop using the antibiotic even if you start to feel better. Keep all follow-up visits. This is important. This information is not intended to replace advice given to you by your health care provider. Make sure you discuss any questions you have with your health care provider. Document Revised: 11/21/2019 Document Reviewed: 11/21/2019 Elsevier Patient Education  [place].    If you have been instructed to have an in-person evaluation today at a local Urgent Care facility, please use the link below. It will take you to a list of all of our available [facility], including address, phone number and hours of operation. Please do not delay care.  [facility]  If you or a family member do not have a primary care provider, use the link below to schedule a visit and establish care. When you choose a [facility] primary care physician or advanced practice provider, you gain a long-term partner in health. Find a Primary Care Provider  Learn more about 's in-office and virtual care options: [facility]

## 2021-12-30 NOTE — Progress Notes (Signed)
MEDICARE ANNUAL WELLNESS VISIT  12/30/2021  Telephone Visit Disclaimer This Medicare AWV was conducted by telephone due to national recommendations for restrictions regarding the COVID-19 Pandemic (e.g. social distancing).  I verified, using two identifiers, that I am speaking with Sarah Wright or their authorized healthcare agent. I discussed the limitations, risks, security, and privacy concerns of performing an evaluation and management service by telephone and the potential availability of an in-person appointment in the future. The patient expressed understanding and agreed to proceed.  Location of Patient: Home Location of Provider (nurse):  In the office.  Subjective:    Sarah Wright is a 76 y.o. female patient of Alden Hipp, Royetta Car, PA-C who had a [employer] Visit today via telephone. Blessings is Retired and lives with their daughter. she has 3 children. she reports that she is socially active and does interact with friends/family regularly. she is minimally physically active and enjoys spending time with family.  Patient Care Team: Lavada Mesi as PCP - General (Family Medicine)     12/30/2021    1:58 PM 03/16/2014    1:56 PM  Advanced Directives  Does Patient Have a Medical Advance Directive? No No  Would patient like information on creating a medical advance directive? No - Patient declined No - patient declined information    Hospital Utilization Over the Past 12 Months: # of hospitalizations or ER visits: 0 # of surgeries: 0  Review of Systems    Patient reports that her overall health is worse compared to last year.  History obtained from chart review and the patient  Patient Reported Readings (BP, Pulse, CBG, Weight, etc) none  Pain Assessment Pain : 0-10 Pain Score: 10-Worst pain ever Pain Type: Acute pain Pain Location: Pelvis (UTI- pelvic spasms) Pain Descriptors / Indicators: Spasm Pain Relieving Factors: medication  Pain  Relieving Factors: medication  Current Medications & Allergies (verified) Allergies as of 12/30/2021       Reactions   Molnupiravir Nausea Only   headache   Sulfa Antibiotics    Patient had reaction as a child; does not know what type of reaction        Medication List        Accurate as of December 30, 2021  2:09 PM. If you have any questions, ask your nurse or doctor.          aspirin 81 MG tablet Take 81 mg by mouth daily.   atorvastatin 20 MG tablet Commonly known as: LIPITOR TAKE 1 TABLET BY MOUTH EVERY DAY .   Breztri Aerosphere 160-9-4.8 MCG/ACT Aero Generic drug: Budeson-Glycopyrrol-Formoterol Inhale 2 puffs into the lungs in the morning and at bedtime.   buPROPion 150 MG 24 hr tablet Commonly known as: WELLBUTRIN XL TAKE 2 TABLETS BY MOUTH DAILY   cyclobenzaprine 10 MG tablet Commonly known as: FLEXERIL TAKE ONE TABLET (10 MG DOSE) BY MOUTH 3 TIMES A DAY AS NEEDED FOR MUSCLE SPASMS FOR UP TO 30 DAYS.   donepezil 10 MG tablet Commonly known as: ARICEPT TAKE 1 TABLET BY MOUTH EVERY DAY   DULoxetine 60 MG capsule Commonly known as: CYMBALTA TAKE 1 CAPSULE BY MOUTH TWICE A DAY   fluticasone 50 MCG/ACT nasal spray Commonly known as: FLONASE SPRAY 2 SPRAYS INTO EACH NOSTRIL EVERY DAY   fluticasone 50 MCG/ACT nasal spray Commonly known as: FLONASE Place into both nostrils.   furosemide 20 MG tablet Commonly known as: LASIX Take daily as needed for lower extremity swelling. Needs  appointment for another refill   hydrOXYzine 25 MG tablet Commonly known as: ATARAX TAKE 1 TABLET BY MOUTH AS NEEDED UP TO TWICE A DAY   ipratropium-albuterol 0.5-2.5 (3) MG/3ML Soln Commonly known as: DUONEB Take 3 mLs by nebulization every 4 (four) hours as needed.   levalbuterol 45 MCG/ACT inhaler Commonly known as: XOPENEX HFA INHALE 1 TO 2 PUFFS BY MOUTH EVERY 6 HOURS AS NEEDED FOR WHEEZE   meloxicam 15 MG tablet Commonly known as: MOBIC TAKE 1 TABLET BY MOUTH  EVERY DAY   montelukast 10 MG tablet Commonly known as: SINGULAIR TAKE 1 TABLET BY MOUTH EVERYDAY AT BEDTIME   OLANZapine 7.5 MG tablet Commonly known as: ZYPREXA TAKE 1 TABLET BY MOUTH EVERYDAY AT BEDTIME   ondansetron 8 MG tablet Commonly known as: ZOFRAN TAKE 1 TABLET BY MOUTH EVERY 8 HOURS AS NEEDED FOR NAUSEA AND VOMITING   ondansetron 8 MG tablet Commonly known as: ZOFRAN Take by mouth.   oxyCODONE 15 MG immediate release tablet Commonly known as: ROXICODONE Take 1 tablet (15 mg total) by mouth in the morning, at noon, and at bedtime.   rOPINIRole 2 MG tablet Commonly known as: REQUIP TAKE 1 TABLET BY MOUTH AT BEDTIME.   Semaglutide (2 MG/DOSE) 8 MG/3ML Sopn Inject 2 mg as directed once a week.   topiramate 50 MG tablet Commonly known as: TOPAMAX TAKE 1 TABLET BY MOUTH TWICE A DAY   traZODone 50 MG tablet Commonly known as: DESYREL Take 0.5-1 tablets (25-50 mg total) by mouth at bedtime as needed for sleep.        History (reviewed): Past Medical History:  Diagnosis Date   Diabetes mellitus without complication ([place])    Hyperlipidemia    Thyroid disease    Past Surgical History:  Procedure Laterality Date   ABDOMINAL HYSTERECTOMY     CARPAL TUNNEL RELEASE     both wrists   CATARACT EXTRACTION, BILATERAL     CERVICAL FUSION     GALLBLADDER SURGERY     right foot surgery     THYROIDECTOMY, PARTIAL     right side removed   Family History  Problem Relation Age of Onset   Depression Mother    Heart attack Father    Diabetes Father    Uterine cancer Other    Pancreatic cancer Other    Hypertension Other        parents   Social History   Socioeconomic History   Marital status: Divorced    Spouse name: Not on file   Number of children: 3   Years of education: 13.5   Highest education level: Associate degree: occupational, Hotel manager, or vocational program  Occupational History   Occupation: Retired  Tobacco Use   Smoking status: Former     Types: Cigarettes    Quit date: 02/12/2014    Years since quitting: 7.8   Smokeless tobacco: Never  Substance and Sexual Activity   Alcohol use: No    Alcohol/week: 0.0 standard drinks of alcohol   Drug use: No   Sexual activity: Not on file  Other Topics Concern   Not on file  Social History Narrative   Lives with her daughter and grand daughter. She enjoys spending time with family.   Social Determinants of Health   Financial Resource Strain: Low Risk  (12/30/2021)   Overall Financial Resource Strain (CARDIA)    Difficulty of Paying Living Expenses: Not hard at all  Food Insecurity: No Food Insecurity (12/30/2021)   Hunger Vital  Sign    Worried About Charity fundraiser in the Last Year: Never true    [place] in the Last Year: Never true  Transportation Needs: No Transportation Needs (12/30/2021)   PRAPARE - Hydrologist (Medical): No    Lack of Transportation (Non-Medical): No  Physical Activity: Inactive (12/30/2021)   Exercise Vital Sign    Days of Exercise per Week: 0 days    Minutes of Exercise per Session: 0 min  Stress: No Stress Concern Present (12/30/2021)   [place]    Feeling of Stress : Not at all  Social Connections: Socially Isolated (12/30/2021)   Social Connection and Isolation Panel [NHANES]    Frequency of Communication with Friends and Family: Once a week    Frequency of Social Gatherings with Friends and Family: More than three times a week    Attends Religious Services: Never    Marine scientist or Organizations: No    Attends Archivist Meetings: Never    Marital Status: Divorced    Activities of Daily Living    12/30/2021    1:59 PM  In your present state of health, do you have any difficulty performing the following activities:  Hearing? 0  Vision? 1  Comment macular degeneration.  Difficulty concentrating or making decisions? 1   Comment was recently diagnosed with dementia  Walking or climbing stairs? 1  Comment difficulty going up stairs.  Dressing or bathing? 0  Doing errands, shopping? 1  Comment she doesn't drive.  Preparing Food and eating ? Y  Comment her daughter cooks but she can feed herself.  Using the Toilet? N  In the past six months, have you accidently leaked urine? Y  Comment sometimes.  Do you have problems with loss of bowel control? N  Managing your Medications? N  Managing your Finances? N  Housekeeping or managing your Housekeeping? Y  Comment her daughter does that.    Patient Education/ Literacy How often do you need to have someone help you when you read instructions, pamphlets, or other written materials from your doctor or pharmacy?: 1 - Never What is the last grade level you completed in school?: Vocational school  Exercise Current Exercise Habits: The patient does not participate in regular exercise at present, Exercise limited by: orthopedic condition(s)  Diet Patient reports consuming 3 meals a day and 0 snack(s) a day Patient reports that her primary diet is: Regular Patient reports that she does have regular access to food.   Depression Screen    12/30/2021    1:54 PM 12/19/2021   11:09 AM 08/20/2020   11:00 AM 10/28/2019    1:17 PM 08/09/2018    4:31 PM 05/02/2017   11:08 AM 09/26/2016   10:07 AM  PHQ 2/9 Scores  PHQ - 2 Score 0 '6 5 1 2 1 4  '$ PHQ- 9 Score '5 20 7 5 5 5 10     '$ Fall Risk    12/30/2021    1:54 PM 03/17/2019   12:47 PM 11/25/2018    5:38 PM 12/29/2016   11:04 AM 12/23/2015    6:45 PM  Fall Risk   Falls in the past year? 1 0  No No  Comment  Emmi Telephone Survey: data to providers prior to load Emmi Telephone Survey: data to providers prior to load  Emmi Telephone Survey: data to providers prior to load  Number  falls in past yr: 1      Comment   Emmi Telephone Survey Actual Response =     Injury with Fall? 1      Risk for fall due to : History of fall(s)    Impaired balance/gait   Follow up Falls evaluation completed;Education provided;Falls prevention discussed         Objective:  Sarah Wright seemed alert and oriented and she participated appropriately during our telephone visit.  Blood Pressure Weight BMI  BP Readings from Last 3 Encounters:  12/19/21 (!) 113/41  12/08/21 118/84  10/26/21 (!) 112/45   Wt Readings from Last 3 Encounters:  12/19/21 228 lb (103.4 kg)  12/08/21 230 lb (104.3 kg)  10/26/21 229 lb (103.9 kg)   BMI Readings from Last 1 Encounters:  12/19/21 37.94 kg/m    *Unable to obtain current vital signs, weight, and BMI due to telephone visit type  Hearing/Vision  Cinzia did not seem to have difficulty with hearing/understanding during the telephone conversation Reports that she has had a formal eye exam by an eye care professional within the past year Reports that she has not had a formal hearing evaluation within the past year *Unable to fully assess hearing and vision during telephone visit type  Cognitive Function:    12/30/2021    2:03 PM  6CIT Screen  What Year? 0 points  What month? 0 points  What time? 0 points  Count back from 20 0 points  Months in reverse 0 points  Repeat phrase 0 points  Total Score 0 points   (Normal:0-7, Significant for Dysfunction: >8)  Normal Cognitive Function Screening: Yes   Immunization & Health Maintenance Record Immunization History  Administered Date(s) Administered   Fluad Quad(high Dose 65+) 03/02/2021   Influenza, High Dose Seasonal PF 12/13/2016, 02/08/2018, 12/17/2018   Influenza-Unspecified 12/21/2014, 01/09/2016, 12/13/2016, 02/08/2018, 01/21/2020   Moderna Sars-Covid-2 Vaccination 07/09/2020   PFIZER(Purple Top)SARS-COV-2 Vaccination 05/20/2019, 06/17/2019   PPD Test 09/23/2014   Pneumococcal Conjugate-13 01/12/2014   Pneumococcal Polysaccharide-23 04/29/2003, 03/28/2016   Tdap 10/04/2015   Zoster Recombinat (Shingrix) 11/21/2016   Zoster, Live  05/22/2012    Health Maintenance  Topic Date Due   Diabetic kidney evaluation - Urine ACR  12/31/2021 (Originally 10/27/2020)   COVID-19 Vaccine (4 - Mixed Product risk series) 01/04/2022 (Originally 09/03/2020)   Zoster Vaccines- Shingrix (2 of 2) 03/21/2022 (Originally 01/16/2017)   INFLUENZA VACCINE  07/23/2022 (Originally 11/22/2021)   HEMOGLOBIN A1C  04/28/2022   OPHTHALMOLOGY EXAM  07/06/2022   FOOT EXAM  07/13/2022   Diabetic kidney evaluation - GFR measurement  10/27/2022   TETANUS/TDAP  10/03/2025   Pneumonia Vaccine 53+ Years old  Completed   DEXA SCAN  Completed   Hepatitis C Screening  Completed   HPV VACCINES  Aged Out   COLONOSCOPY (Pts 45-54yr Insurance coverage will need to be confirmed)  Discontinued       Assessment  This is a routine wellness examination for LKellogg  Health Maintenance: Due or Overdue There are no preventive care reminders to display for this patient.   LLenard Simmerdoes not need a referral for Community Assistance: Care Management:   no Social Work:    no Prescription Assistance:  no Nutrition/Diabetes Education:  no   Plan:  Personalized Goals  Goals Addressed               This Visit's Progress     Patient Stated (pt-stated)  12/30/2021 AWV Goal: Exercise for General Health  Patient will verbalize understanding of the benefits of increased physical activity: Exercising regularly is important. It will improve your overall fitness, flexibility, and endurance. Regular exercise also will improve your overall health. It can help you control your weight, reduce stress, and improve your bone density. Over the next year, patient will increase physical activity as tolerated with a goal of at least 150 minutes of moderate physical activity per week.  You can tell that you are exercising at a moderate intensity if your heart starts beating faster and you start breathing faster but can still hold a conversation. Moderate-intensity  exercise ideas include: Walking 1 mile (1.6 km) in about 15 minutes Biking Hiking Golfing Dancing Water aerobics Patient will verbalize understanding of everyday activities that increase physical activity by providing examples like the following: Yard work, such as: Sales promotion account executive Gardening Washing windows or floors Patient will be able to explain general safety guidelines for exercising:  Before you start a new exercise program, talk with your health care provider. Do not exercise so much that you hurt yourself, feel dizzy, or get very short of breath. Wear comfortable clothes and wear shoes with good support. Drink plenty of water while you exercise to prevent dehydration or heat stroke. Work out until your breathing and your heartbeat get faster.        Personalized Health Maintenance & Screening Recommendations  Influenza vaccine Shingrix vaccine-2nd dose.  Lung Cancer Screening Recommended: no (Low Dose CT Chest recommended if Age 89-80 years, 30 pack-year currently smoking OR have quit w/in past 15 years) Hepatitis C Screening recommended: no HIV Screening recommended: no  Advanced Directives: Written information was not prepared per patient's request.  Referrals & Orders Orders Placed This Encounter  Procedures   [place]    Follow-up Plan Follow-up with [name] L, PA-C as planned Schedule the shingrix vaccine -2nd dose and flu shot at the pharmacy. Bone density referral has been sent.  Medicare wellness visit in one year. Patient will access AVS on my chart.   I have personally reviewed and noted the following in the patient's chart:   Medical and social history Use of alcohol, tobacco or illicit drugs  Current medications and supplements Functional ability and status Nutritional status Physical activity Advanced directives List of other  physicians Hospitalizations, surgeries, and ER visits in previous 12 months Vitals Screenings to include cognitive, depression, and falls Referrals and appointments  In addition, I have reviewed and discussed with Sarah Wright certain preventive protocols, quality metrics, and best practice recommendations. A written personalized care plan for preventive services as well as general preventive health recommendations is available and can be mailed to the patient at her request.      Tinnie Gens, RN BSN  12/30/2021

## 2022-01-02 ENCOUNTER — Telehealth: Payer: Self-pay | Admitting: Neurology

## 2022-01-02 NOTE — Telephone Encounter (Signed)
Darnelle Maffucci, PT with [facility], called and LVM letting us know start of care delayed. It was supposed to be today but patient woke up with nausea/stomach bug and they delayed care til Thursday. To call with questions [phone].

## 2022-01-12 ENCOUNTER — Other Ambulatory Visit: Payer: Self-pay | Admitting: Physician Assistant

## 2022-01-12 DIAGNOSIS — L84 Corns and callosities: Secondary | ICD-10-CM | POA: Diagnosis not present

## 2022-01-12 DIAGNOSIS — J449 Chronic obstructive pulmonary disease, unspecified: Secondary | ICD-10-CM | POA: Diagnosis not present

## 2022-01-12 DIAGNOSIS — H6123 Impacted cerumen, bilateral: Secondary | ICD-10-CM | POA: Diagnosis not present

## 2022-01-12 DIAGNOSIS — H903 Sensorineural hearing loss, bilateral: Secondary | ICD-10-CM | POA: Diagnosis not present

## 2022-01-12 DIAGNOSIS — F410 Panic disorder [episodic paroxysmal anxiety] without agoraphobia: Secondary | ICD-10-CM | POA: Diagnosis not present

## 2022-01-12 DIAGNOSIS — M5136 Other intervertebral disc degeneration, lumbar region: Secondary | ICD-10-CM | POA: Diagnosis not present

## 2022-01-12 DIAGNOSIS — E1142 Type 2 diabetes mellitus with diabetic polyneuropathy: Secondary | ICD-10-CM | POA: Diagnosis not present

## 2022-01-12 DIAGNOSIS — R413 Other amnesia: Secondary | ICD-10-CM | POA: Diagnosis not present

## 2022-01-12 DIAGNOSIS — E1151 Type 2 diabetes mellitus with diabetic peripheral angiopathy without gangrene: Secondary | ICD-10-CM | POA: Diagnosis not present

## 2022-01-12 DIAGNOSIS — I872 Venous insufficiency (chronic) (peripheral): Secondary | ICD-10-CM | POA: Diagnosis not present

## 2022-01-12 DIAGNOSIS — L409 Psoriasis, unspecified: Secondary | ICD-10-CM | POA: Diagnosis not present

## 2022-01-12 DIAGNOSIS — M170 Bilateral primary osteoarthritis of knee: Secondary | ICD-10-CM | POA: Diagnosis not present

## 2022-01-12 DIAGNOSIS — I951 Orthostatic hypotension: Secondary | ICD-10-CM | POA: Diagnosis not present

## 2022-01-12 DIAGNOSIS — E278 Other specified disorders of adrenal gland: Secondary | ICD-10-CM | POA: Diagnosis not present

## 2022-01-12 DIAGNOSIS — E041 Nontoxic single thyroid nodule: Secondary | ICD-10-CM | POA: Diagnosis not present

## 2022-01-12 DIAGNOSIS — G2581 Restless legs syndrome: Secondary | ICD-10-CM | POA: Diagnosis not present

## 2022-01-12 DIAGNOSIS — G894 Chronic pain syndrome: Secondary | ICD-10-CM | POA: Diagnosis not present

## 2022-01-12 DIAGNOSIS — I700 Atherosclerosis of aorta: Secondary | ICD-10-CM | POA: Diagnosis not present

## 2022-01-12 DIAGNOSIS — K760 Fatty (change of) liver, not elsewhere classified: Secondary | ICD-10-CM | POA: Diagnosis not present

## 2022-01-12 DIAGNOSIS — M7552 Bursitis of left shoulder: Secondary | ICD-10-CM | POA: Diagnosis not present

## 2022-01-12 DIAGNOSIS — M4802 Spinal stenosis, cervical region: Secondary | ICD-10-CM | POA: Diagnosis not present

## 2022-01-12 DIAGNOSIS — F332 Major depressive disorder, recurrent severe without psychotic features: Secondary | ICD-10-CM | POA: Diagnosis not present

## 2022-01-12 DIAGNOSIS — M47816 Spondylosis without myelopathy or radiculopathy, lumbar region: Secondary | ICD-10-CM | POA: Diagnosis not present

## 2022-01-12 DIAGNOSIS — M50322 Other cervical disc degeneration at C5-C6 level: Secondary | ICD-10-CM | POA: Diagnosis not present

## 2022-01-13 DIAGNOSIS — M2041 Other hammer toe(s) (acquired), right foot: Secondary | ICD-10-CM | POA: Diagnosis not present

## 2022-01-13 DIAGNOSIS — L97511 Non-pressure chronic ulcer of other part of right foot limited to breakdown of skin: Secondary | ICD-10-CM | POA: Diagnosis not present

## 2022-01-13 DIAGNOSIS — E1142 Type 2 diabetes mellitus with diabetic polyneuropathy: Secondary | ICD-10-CM | POA: Diagnosis not present

## 2022-01-16 DIAGNOSIS — M4802 Spinal stenosis, cervical region: Secondary | ICD-10-CM | POA: Diagnosis not present

## 2022-01-16 DIAGNOSIS — M5136 Other intervertebral disc degeneration, lumbar region: Secondary | ICD-10-CM | POA: Diagnosis not present

## 2022-01-16 DIAGNOSIS — M50322 Other cervical disc degeneration at C5-C6 level: Secondary | ICD-10-CM | POA: Diagnosis not present

## 2022-01-16 DIAGNOSIS — G894 Chronic pain syndrome: Secondary | ICD-10-CM | POA: Diagnosis not present

## 2022-01-16 DIAGNOSIS — M170 Bilateral primary osteoarthritis of knee: Secondary | ICD-10-CM | POA: Diagnosis not present

## 2022-01-16 DIAGNOSIS — M7552 Bursitis of left shoulder: Secondary | ICD-10-CM | POA: Diagnosis not present

## 2022-01-17 DIAGNOSIS — M4802 Spinal stenosis, cervical region: Secondary | ICD-10-CM | POA: Diagnosis not present

## 2022-01-17 DIAGNOSIS — M7552 Bursitis of left shoulder: Secondary | ICD-10-CM | POA: Diagnosis not present

## 2022-01-17 DIAGNOSIS — M170 Bilateral primary osteoarthritis of knee: Secondary | ICD-10-CM | POA: Diagnosis not present

## 2022-01-17 DIAGNOSIS — M50322 Other cervical disc degeneration at C5-C6 level: Secondary | ICD-10-CM | POA: Diagnosis not present

## 2022-01-17 DIAGNOSIS — G894 Chronic pain syndrome: Secondary | ICD-10-CM | POA: Diagnosis not present

## 2022-01-17 DIAGNOSIS — M5136 Other intervertebral disc degeneration, lumbar region: Secondary | ICD-10-CM | POA: Diagnosis not present

## 2022-01-19 DIAGNOSIS — Q891 Congenital malformations of adrenal gland: Secondary | ICD-10-CM | POA: Diagnosis not present

## 2022-01-19 DIAGNOSIS — E278 Other specified disorders of adrenal gland: Secondary | ICD-10-CM | POA: Diagnosis not present

## 2022-01-19 DIAGNOSIS — J449 Chronic obstructive pulmonary disease, unspecified: Secondary | ICD-10-CM | POA: Diagnosis not present

## 2022-01-20 ENCOUNTER — Encounter: Payer: Self-pay | Admitting: Physician Assistant

## 2022-01-20 ENCOUNTER — Telehealth (INDEPENDENT_AMBULATORY_CARE_PROVIDER_SITE_OTHER): Payer: Medicare Other | Admitting: Physician Assistant

## 2022-01-20 VITALS — Temp 99.1°F | Ht 65.0 in | Wt 228.0 lb

## 2022-01-20 DIAGNOSIS — J441 Chronic obstructive pulmonary disease with (acute) exacerbation: Secondary | ICD-10-CM

## 2022-01-20 MED ORDER — PREDNISONE 50 MG PO TABS: ORAL_TABLET | ORAL | 0 refills | Status: DC

## 2022-01-20 MED ORDER — AZITHROMYCIN 250 MG PO TABS: ORAL_TABLET | ORAL | 0 refills | Status: DC

## 2022-01-20 NOTE — Progress Notes (Signed)
.Virtual Visit via Video Note  I connected with Sarah Wright on 01/20/22 at 11:30 AM EDT by a video enabled telemedicine application and verified that I am speaking with the correct person using two identifiers.  Location: Patient: home Provider: clinic  .[name] KitchenParticipating in visit:  Patient: Sarah Wright Provider: [name] PA-C    I discussed the limitations of evaluation and management by telemedicine and the availability of in person appointments. The patient expressed understanding and agreed to proceed.  History of Present Illness: Pt is a 76 yo obese female with COPD who see pulmonology and recently started on Breztri who is having more and more SOB, cough, sinus pressure, and congestion. She is taking mucinex. Symptoms worsening over the last week. Fever is low grade. She is having body aches and really fatigued. Negative home covid test. No sick contacts. Using albuterol more.   .. Active Ambulatory Problems    Diagnosis Date Noted   Anxiety and depression 03/16/2014   Dyslipidemia, goal LDL below 70 03/16/2014   COLD (chronic obstructive lung disease) ([place]) 03/16/2014   Insomnia 03/16/2014   Esophageal reflux 03/16/2014   Bilateral hand pain 03/16/2014   DDD (degenerative disc disease), lumbar 03/16/2014   Lumbar spondylosis 03/17/2014   Aortic atherosclerosis ([place]) 03/17/2014   Chronic venous insufficiency 03/27/2014   H/O colonoscopy 04/02/2014   Thyroid disease 04/02/2014   Cataract 04/02/2014   Type 2 diabetes mellitus ([place]) 04/02/2014   Primary osteoarthritis of both knees 07/21/2014   Class 3 severe obesity due to excess calories with serious comorbidity and body mass index (BMI) of 40.0 to 44.9 in adult [facility]) 10/07/2014   Dizziness 01/04/2015   Sensorineural hearing loss of both ears 01/26/2015   Thyroid nodule 06/22/2015   Memory loss 07/29/2015   Psoriasis 09/23/2015   Right shoulder injury 01/27/2016   Balance problem 08/13/2016   Acute pain of left shoulder  08/13/2016   Numbness around mouth 09/08/2016   DDD (degenerative disc disease), cervical 12/29/2016   Microalbuminuria 01/30/2017   Chronic pain syndrome 05/06/2017   Skin abrasion 08/01/2017   Leukocytes in urine 08/01/2017   Dysuria 08/01/2017   Panic attack 08/01/2017   RLS (restless legs syndrome) 08/12/2018   COPD exacerbation ([place]) 08/14/2019   Right ankle pain 01/20/2020   Severe episode of recurrent major depressive disorder, without psychotic features ([place]) 08/20/2020   Excessive crying 08/20/2020   Orthostatic hypotension 03/22/2021   Bilateral impacted cerumen 06/14/2021   Callus of toe 07/15/2021   Pain management 07/26/2021   SOB (shortness of breath) on exertion 10/26/2021   Hypoxia 10/26/2021   Lung crackles 10/26/2021   Left adrenal mass ([place]) 11/08/2021   Fatty liver disease, nonalcoholic 28/78/6767   Fall 12/19/2021   Bilateral primary osteoarthritis of knee 12/28/2021   Resolved Ambulatory Problems    Diagnosis Date Noted   No Resolved Ambulatory Problems   Past Medical History:  Diagnosis Date   Diabetes mellitus without complication ([place])    Hyperlipidemia        Observations/Objective: No acute distress Productive cough  .[name] Today's Vitals   01/20/22 1117  Temp: 99.1 F (37.3 C)  TempSrc: Oral  Weight: 228 lb (103.4 kg)  Height: '5\' 5"'$  (1.651 m)   Body mass index is 37.94 kg/m.    Assessment and Plan: [name] KitchenMarland KitchenLuanne was seen today for sinus problem.  Diagnoses and all orders for this visit:  COPD exacerbation ([place]) -     azithromycin (ZITHROMAX Z-PAK) 250 MG tablet; Take 2 tablets (500 mg)  on  Day 1,  followed by 1 tablet (250 mg) once daily on Days 2 through 5. -     predniSONE (DELTASONE) 50 MG tablet; One tab PO daily for 5 days.   Sent zpak and prednisone Continue rescue and preventative inhalers Follow up as needed or if symptoms worsen   Follow Up Instructions:    I discussed the assessment and treatment plan with the  patient. The patient was provided an opportunity to ask questions and all were answered. The patient agreed with the plan and demonstrated an understanding of the instructions.   The patient was advised to call back or seek an in-person evaluation if the symptoms worsen or if the condition fails to improve as anticipated.     [name], PA-C

## 2022-01-20 NOTE — Progress Notes (Signed)
One week history  Sinus congestion, fever (99.1), body aches/chills, sore throat, headaches Feels like getting worse Currently taking a home Covid test and waiting on results

## 2022-01-24 ENCOUNTER — Encounter: Payer: Self-pay | Admitting: Physician Assistant

## 2022-01-29 ENCOUNTER — Other Ambulatory Visit: Payer: Self-pay | Admitting: Physician Assistant

## 2022-01-29 DIAGNOSIS — R110 Nausea: Secondary | ICD-10-CM

## 2022-01-30 ENCOUNTER — Other Ambulatory Visit: Payer: Self-pay | Admitting: Physician Assistant

## 2022-01-30 DIAGNOSIS — M50322 Other cervical disc degeneration at C5-C6 level: Secondary | ICD-10-CM | POA: Diagnosis not present

## 2022-01-30 DIAGNOSIS — G894 Chronic pain syndrome: Secondary | ICD-10-CM | POA: Diagnosis not present

## 2022-01-30 DIAGNOSIS — M7552 Bursitis of left shoulder: Secondary | ICD-10-CM | POA: Diagnosis not present

## 2022-01-30 DIAGNOSIS — M170 Bilateral primary osteoarthritis of knee: Secondary | ICD-10-CM | POA: Diagnosis not present

## 2022-01-30 DIAGNOSIS — M5136 Other intervertebral disc degeneration, lumbar region: Secondary | ICD-10-CM | POA: Diagnosis not present

## 2022-01-30 DIAGNOSIS — M4802 Spinal stenosis, cervical region: Secondary | ICD-10-CM | POA: Diagnosis not present

## 2022-01-30 NOTE — Telephone Encounter (Signed)
Routing to covering provider.   [facility] requesting med refill for ondansetron. Written by historical provider.

## 2022-01-30 NOTE — Telephone Encounter (Signed)
Refills denied for Zofran.  She was sent 20 tabs with 2 refills in August.  If she is already used all of that then she probably needs to see GI.

## 2022-01-31 DIAGNOSIS — M170 Bilateral primary osteoarthritis of knee: Secondary | ICD-10-CM | POA: Diagnosis not present

## 2022-01-31 DIAGNOSIS — M5136 Other intervertebral disc degeneration, lumbar region: Secondary | ICD-10-CM | POA: Diagnosis not present

## 2022-01-31 DIAGNOSIS — G894 Chronic pain syndrome: Secondary | ICD-10-CM | POA: Diagnosis not present

## 2022-01-31 DIAGNOSIS — M7552 Bursitis of left shoulder: Secondary | ICD-10-CM | POA: Diagnosis not present

## 2022-01-31 DIAGNOSIS — M50322 Other cervical disc degeneration at C5-C6 level: Secondary | ICD-10-CM | POA: Diagnosis not present

## 2022-01-31 DIAGNOSIS — M4802 Spinal stenosis, cervical region: Secondary | ICD-10-CM | POA: Diagnosis not present

## 2022-02-01 DIAGNOSIS — E538 Deficiency of other specified B group vitamins: Secondary | ICD-10-CM | POA: Diagnosis not present

## 2022-02-02 ENCOUNTER — Encounter: Payer: Self-pay | Admitting: Physician Assistant

## 2022-02-02 DIAGNOSIS — G894 Chronic pain syndrome: Secondary | ICD-10-CM

## 2022-02-02 DIAGNOSIS — R52 Pain, unspecified: Secondary | ICD-10-CM

## 2022-02-06 ENCOUNTER — Other Ambulatory Visit: Payer: Self-pay

## 2022-02-06 DIAGNOSIS — Q891 Congenital malformations of adrenal gland: Secondary | ICD-10-CM | POA: Diagnosis not present

## 2022-02-06 DIAGNOSIS — M170 Bilateral primary osteoarthritis of knee: Secondary | ICD-10-CM | POA: Diagnosis not present

## 2022-02-06 DIAGNOSIS — G894 Chronic pain syndrome: Secondary | ICD-10-CM

## 2022-02-06 DIAGNOSIS — R52 Pain, unspecified: Secondary | ICD-10-CM

## 2022-02-06 DIAGNOSIS — M7552 Bursitis of left shoulder: Secondary | ICD-10-CM | POA: Diagnosis not present

## 2022-02-06 DIAGNOSIS — E278 Other specified disorders of adrenal gland: Secondary | ICD-10-CM | POA: Diagnosis not present

## 2022-02-06 DIAGNOSIS — M5136 Other intervertebral disc degeneration, lumbar region: Secondary | ICD-10-CM | POA: Diagnosis not present

## 2022-02-06 DIAGNOSIS — M50322 Other cervical disc degeneration at C5-C6 level: Secondary | ICD-10-CM | POA: Diagnosis not present

## 2022-02-06 DIAGNOSIS — M4802 Spinal stenosis, cervical region: Secondary | ICD-10-CM | POA: Diagnosis not present

## 2022-02-06 MED ORDER — OXYCODONE HCL 15 MG PO TABS: 15.0000 mg | ORAL_TABLET | Freq: Three times a day (TID) | ORAL | 0 refills | Status: DC

## 2022-02-06 NOTE — Telephone Encounter (Signed)
Pt called back stating she is waiting to hear back from [facility] for her Oxycodone that she left a message about last week

## 2022-02-06 NOTE — Telephone Encounter (Signed)
Patient is on pain contract - UTD as of April 2023 for oxycodone.   Last written 12/19/2021 #90 no refills Last appt 01/20/2022

## 2022-02-06 NOTE — Telephone Encounter (Signed)
Sent today

## 2022-02-06 NOTE — Telephone Encounter (Signed)
Patient left a vm msg after hours on medical assistant line requesting a med refill for oxycodone. Rx not listed in active med list.

## 2022-02-07 DIAGNOSIS — M5136 Other intervertebral disc degeneration, lumbar region: Secondary | ICD-10-CM | POA: Diagnosis not present

## 2022-02-07 DIAGNOSIS — G894 Chronic pain syndrome: Secondary | ICD-10-CM | POA: Diagnosis not present

## 2022-02-07 DIAGNOSIS — M50322 Other cervical disc degeneration at C5-C6 level: Secondary | ICD-10-CM | POA: Diagnosis not present

## 2022-02-07 DIAGNOSIS — M170 Bilateral primary osteoarthritis of knee: Secondary | ICD-10-CM | POA: Diagnosis not present

## 2022-02-07 DIAGNOSIS — M7552 Bursitis of left shoulder: Secondary | ICD-10-CM | POA: Diagnosis not present

## 2022-02-07 DIAGNOSIS — M4802 Spinal stenosis, cervical region: Secondary | ICD-10-CM | POA: Diagnosis not present

## 2022-02-07 NOTE — Telephone Encounter (Signed)
Patient made aware RX has been sent.

## 2022-02-09 DIAGNOSIS — M4802 Spinal stenosis, cervical region: Secondary | ICD-10-CM | POA: Diagnosis not present

## 2022-02-09 DIAGNOSIS — M170 Bilateral primary osteoarthritis of knee: Secondary | ICD-10-CM | POA: Diagnosis not present

## 2022-02-09 DIAGNOSIS — M5136 Other intervertebral disc degeneration, lumbar region: Secondary | ICD-10-CM | POA: Diagnosis not present

## 2022-02-09 DIAGNOSIS — G894 Chronic pain syndrome: Secondary | ICD-10-CM | POA: Diagnosis not present

## 2022-02-09 DIAGNOSIS — M7552 Bursitis of left shoulder: Secondary | ICD-10-CM | POA: Diagnosis not present

## 2022-02-09 DIAGNOSIS — M50322 Other cervical disc degeneration at C5-C6 level: Secondary | ICD-10-CM | POA: Diagnosis not present

## 2022-02-11 DIAGNOSIS — I951 Orthostatic hypotension: Secondary | ICD-10-CM | POA: Diagnosis not present

## 2022-02-11 DIAGNOSIS — K760 Fatty (change of) liver, not elsewhere classified: Secondary | ICD-10-CM | POA: Diagnosis not present

## 2022-02-11 DIAGNOSIS — H903 Sensorineural hearing loss, bilateral: Secondary | ICD-10-CM | POA: Diagnosis not present

## 2022-02-11 DIAGNOSIS — L409 Psoriasis, unspecified: Secondary | ICD-10-CM | POA: Diagnosis not present

## 2022-02-11 DIAGNOSIS — R413 Other amnesia: Secondary | ICD-10-CM | POA: Diagnosis not present

## 2022-02-11 DIAGNOSIS — E278 Other specified disorders of adrenal gland: Secondary | ICD-10-CM | POA: Diagnosis not present

## 2022-02-11 DIAGNOSIS — I700 Atherosclerosis of aorta: Secondary | ICD-10-CM | POA: Diagnosis not present

## 2022-02-11 DIAGNOSIS — M5136 Other intervertebral disc degeneration, lumbar region: Secondary | ICD-10-CM | POA: Diagnosis not present

## 2022-02-11 DIAGNOSIS — G2581 Restless legs syndrome: Secondary | ICD-10-CM | POA: Diagnosis not present

## 2022-02-11 DIAGNOSIS — M170 Bilateral primary osteoarthritis of knee: Secondary | ICD-10-CM | POA: Diagnosis not present

## 2022-02-11 DIAGNOSIS — L84 Corns and callosities: Secondary | ICD-10-CM | POA: Diagnosis not present

## 2022-02-11 DIAGNOSIS — I872 Venous insufficiency (chronic) (peripheral): Secondary | ICD-10-CM | POA: Diagnosis not present

## 2022-02-11 DIAGNOSIS — M7552 Bursitis of left shoulder: Secondary | ICD-10-CM | POA: Diagnosis not present

## 2022-02-11 DIAGNOSIS — H6123 Impacted cerumen, bilateral: Secondary | ICD-10-CM | POA: Diagnosis not present

## 2022-02-11 DIAGNOSIS — G894 Chronic pain syndrome: Secondary | ICD-10-CM | POA: Diagnosis not present

## 2022-02-11 DIAGNOSIS — E1151 Type 2 diabetes mellitus with diabetic peripheral angiopathy without gangrene: Secondary | ICD-10-CM | POA: Diagnosis not present

## 2022-02-11 DIAGNOSIS — M47816 Spondylosis without myelopathy or radiculopathy, lumbar region: Secondary | ICD-10-CM | POA: Diagnosis not present

## 2022-02-11 DIAGNOSIS — F410 Panic disorder [episodic paroxysmal anxiety] without agoraphobia: Secondary | ICD-10-CM | POA: Diagnosis not present

## 2022-02-11 DIAGNOSIS — M4802 Spinal stenosis, cervical region: Secondary | ICD-10-CM | POA: Diagnosis not present

## 2022-02-11 DIAGNOSIS — M50322 Other cervical disc degeneration at C5-C6 level: Secondary | ICD-10-CM | POA: Diagnosis not present

## 2022-02-11 DIAGNOSIS — J449 Chronic obstructive pulmonary disease, unspecified: Secondary | ICD-10-CM | POA: Diagnosis not present

## 2022-02-11 DIAGNOSIS — E1142 Type 2 diabetes mellitus with diabetic polyneuropathy: Secondary | ICD-10-CM | POA: Diagnosis not present

## 2022-02-11 DIAGNOSIS — E041 Nontoxic single thyroid nodule: Secondary | ICD-10-CM | POA: Diagnosis not present

## 2022-02-11 DIAGNOSIS — F332 Major depressive disorder, recurrent severe without psychotic features: Secondary | ICD-10-CM | POA: Diagnosis not present

## 2022-02-12 ENCOUNTER — Other Ambulatory Visit: Payer: Self-pay | Admitting: Pulmonary Disease

## 2022-02-13 DIAGNOSIS — M7552 Bursitis of left shoulder: Secondary | ICD-10-CM | POA: Diagnosis not present

## 2022-02-13 DIAGNOSIS — M170 Bilateral primary osteoarthritis of knee: Secondary | ICD-10-CM | POA: Diagnosis not present

## 2022-02-13 DIAGNOSIS — M50322 Other cervical disc degeneration at C5-C6 level: Secondary | ICD-10-CM | POA: Diagnosis not present

## 2022-02-13 DIAGNOSIS — M5136 Other intervertebral disc degeneration, lumbar region: Secondary | ICD-10-CM | POA: Diagnosis not present

## 2022-02-13 DIAGNOSIS — G894 Chronic pain syndrome: Secondary | ICD-10-CM | POA: Diagnosis not present

## 2022-02-13 DIAGNOSIS — M4802 Spinal stenosis, cervical region: Secondary | ICD-10-CM | POA: Diagnosis not present

## 2022-02-17 ENCOUNTER — Other Ambulatory Visit: Payer: Self-pay | Admitting: Physician Assistant

## 2022-02-17 DIAGNOSIS — G2581 Restless legs syndrome: Secondary | ICD-10-CM

## 2022-02-17 DIAGNOSIS — G4700 Insomnia, unspecified: Secondary | ICD-10-CM

## 2022-02-17 DIAGNOSIS — K760 Fatty (change of) liver, not elsewhere classified: Secondary | ICD-10-CM

## 2022-02-17 DIAGNOSIS — E1142 Type 2 diabetes mellitus with diabetic polyneuropathy: Secondary | ICD-10-CM

## 2022-02-19 ENCOUNTER — Other Ambulatory Visit: Payer: Self-pay | Admitting: Physician Assistant

## 2022-02-19 DIAGNOSIS — R413 Other amnesia: Secondary | ICD-10-CM

## 2022-02-19 DIAGNOSIS — M25512 Pain in left shoulder: Secondary | ICD-10-CM

## 2022-02-20 ENCOUNTER — Ambulatory Visit (INDEPENDENT_AMBULATORY_CARE_PROVIDER_SITE_OTHER): Payer: Medicare Other | Admitting: Physician Assistant

## 2022-02-20 ENCOUNTER — Encounter: Payer: Self-pay | Admitting: Physician Assistant

## 2022-02-20 VITALS — BP 112/51 | HR 72 | Ht 65.0 in | Wt 224.0 lb

## 2022-02-20 DIAGNOSIS — F411 Generalized anxiety disorder: Secondary | ICD-10-CM | POA: Diagnosis not present

## 2022-02-20 DIAGNOSIS — E1142 Type 2 diabetes mellitus with diabetic polyneuropathy: Secondary | ICD-10-CM | POA: Diagnosis not present

## 2022-02-20 DIAGNOSIS — Z6841 Body Mass Index (BMI) 40.0 and over, adult: Secondary | ICD-10-CM | POA: Diagnosis not present

## 2022-02-20 DIAGNOSIS — R52 Pain, unspecified: Secondary | ICD-10-CM | POA: Diagnosis not present

## 2022-02-20 DIAGNOSIS — Z23 Encounter for immunization: Secondary | ICD-10-CM

## 2022-02-20 DIAGNOSIS — J9611 Chronic respiratory failure with hypoxia: Secondary | ICD-10-CM | POA: Diagnosis not present

## 2022-02-20 DIAGNOSIS — G894 Chronic pain syndrome: Secondary | ICD-10-CM | POA: Diagnosis not present

## 2022-02-20 LAB — POCT UA - MICROALBUMIN
Creatinine, POC: 200 mg/dL
Microalbumin Ur, POC: 80 mg/L

## 2022-02-20 LAB — POCT GLYCOSYLATED HEMOGLOBIN (HGB A1C): Hemoglobin A1C: 5.5 % (ref 4.0–5.6)

## 2022-02-20 MED ORDER — MOUNJARO 7.5 MG/0.5ML ~~LOC~~ SOAJ: 7.5000 mg | SUBCUTANEOUS | 0 refills | Status: DC

## 2022-02-20 MED ORDER — BUSPIRONE HCL 10 MG PO TABS: 10.0000 mg | ORAL_TABLET | Freq: Three times a day (TID) | ORAL | 1 refills | Status: DC

## 2022-02-20 MED ORDER — OXYCODONE HCL 15 MG PO TABS: 15.0000 mg | ORAL_TABLET | Freq: Three times a day (TID) | ORAL | 0 refills | Status: DC

## 2022-02-20 NOTE — Progress Notes (Signed)
Established Patient Office Visit  Subjective   Patient ID: Sarah Wright, female    DOB: [birth year]  Age: 76 y.o. MRN: [id number]  Chief Complaint  Patient presents with   Follow-up   Diabetes    HPI Pt is a 76 yo obese female with T2DM, HLD, Chronic venous insufficiency, thyroid disease, COPD with chronic hypoxia on 2L, MDD, anxiety who presents to the clinic for follow up and medication refills.   Pt will have surgery in Jan 2024 to remove adrenal mass. She is worried about this surgery.    Pt is taking ozempic weekly for diabetes. She would like to start mounjaro to see if it could help with some weight loss. Tolerating ozempic well. No hypoglycemic events. No open sores or wounds.   For her anxiety she is not sure why she stopped buspar and wants to go back on it. She wants to decrease hydroxyzine. Continues on cymbalta, zyprexa, wellbutrin.   COPD- on breztri and doing well. On 2L of O2 continuously.    Patient Active Problem List   Diagnosis Date Noted   Chronic respiratory failure with hypoxia ([place]) 02/20/2022   GAD (generalized anxiety disorder) 02/20/2022   Bilateral primary osteoarthritis of knee 12/28/2021   Fall 12/19/2021   Fatty liver disease, nonalcoholic 35/36/1443   Left adrenal mass ([place]) 11/08/2021   SOB (shortness of breath) on exertion 10/26/2021   Hypoxia 10/26/2021   Lung crackles 10/26/2021   Pain management 07/26/2021   Callus of toe 07/15/2021   Bilateral impacted cerumen 06/14/2021   Orthostatic hypotension 03/22/2021   Severe episode of recurrent major depressive disorder, without psychotic features ([place]) 08/20/2020   Excessive crying 08/20/2020   Right ankle pain 01/20/2020   COPD exacerbation ([place]) 08/14/2019   RLS (restless legs syndrome) 08/12/2018   Skin abrasion 08/01/2017   Leukocytes in urine 08/01/2017   Dysuria 08/01/2017   Panic attack 08/01/2017   Chronic pain syndrome 05/06/2017   Microalbuminuria 01/30/2017   DDD  (degenerative disc disease), cervical 12/29/2016   Numbness around mouth 09/08/2016   Balance problem 08/13/2016   Acute pain of left shoulder 08/13/2016   Right shoulder injury 01/27/2016   Psoriasis 09/23/2015   Memory loss 07/29/2015   Thyroid nodule 06/22/2015   Sensorineural hearing loss of both ears 01/26/2015   Dizziness 01/04/2015   Class 3 severe obesity due to excess calories with serious comorbidity and body mass index (BMI) of 40.0 to 44.9 in adult ([place]) 10/07/2014   Primary osteoarthritis of both knees 07/21/2014   H/O colonoscopy 04/02/2014   Thyroid disease 04/02/2014   Cataract 04/02/2014   Type 2 diabetes mellitus ([place]) 04/02/2014   Chronic venous insufficiency 03/27/2014   Lumbar spondylosis 03/17/2014   Aortic atherosclerosis ([place]) 03/17/2014   Anxiety and depression 03/16/2014   Dyslipidemia, goal LDL below 70 03/16/2014   COLD (chronic obstructive lung disease) ([place]) 03/16/2014   Insomnia 03/16/2014   Esophageal reflux 03/16/2014   Bilateral hand pain 03/16/2014   DDD (degenerative disc disease), lumbar 03/16/2014   Past Medical History:  Diagnosis Date   Diabetes mellitus without complication ([place])    Hyperlipidemia    Thyroid disease    Family History  Problem Relation Age of Onset   Depression Mother    Heart attack Father    Diabetes Father    Uterine cancer Other    Pancreatic cancer Other    Hypertension Other        parents   Allergies  Allergen Reactions  Molnupiravir Nausea Only    headache   Sulfa Antibiotics     Patient had reaction as a child; does not know what type of reaction      Review of Systems  All other systems reviewed and are negative.     Objective:     BP (!) 112/51   Pulse 72   Ht '5\' 5"'$  (1.651 m)   Wt 224 lb (101.6 kg)   SpO2 96%   BMI 37.28 kg/m  BP Readings from Last 3 Encounters:  02/20/22 (!) 112/51  12/19/21 (!) 113/41  12/08/21 118/84   Wt Readings from Last 3 Encounters:  02/20/22 224 lb  (101.6 kg)  01/20/22 228 lb (103.4 kg)  12/19/21 228 lb (103.4 kg)    .[name] Results for orders placed or performed in visit on 02/20/22  POCT glycosylated hemoglobin (Hb A1C)  Result Value Ref Range   Hemoglobin A1C 5.5 4.0 - 5.6 %   HbA1c POC (<> result, manual entry)     HbA1c, POC (prediabetic range)     HbA1c, POC (controlled diabetic range)    POCT UA - Microalbumin  Result Value Ref Range   Microalbumin Ur, POC 80 mg/L   Creatinine, POC 200 mg/dL   Albumin/Creatinine Ratio, Urine, POC 30-300      Physical Exam Vitals reviewed.  Constitutional:      Appearance: Normal appearance. She is obese.  HENT:     Head: Normocephalic.  Cardiovascular:     Rate and Rhythm: Normal rate and regular rhythm.     Heart sounds: Murmur heard.  Pulmonary:     Effort: Pulmonary effort is normal.     Breath sounds: Normal breath sounds.  Neurological:     General: No focal deficit present.     Mental Status: She is alert.  Psychiatric:        Mood and Affect: Mood normal.        Assessment & Plan:  [name] KitchenMarland KitchenLuanne was seen today for follow-up and diabetes.  Diagnoses and all orders for this visit:  Type 2 diabetes mellitus with diabetic polyneuropathy, without long-term current use of insulin (HCC) -     POCT glycosylated hemoglobin (Hb A1C) -     tirzepatide (MOUNJARO) 7.5 MG/0.5ML Pen; Inject 7.5 mg into the skin once a week. -     POCT UA - Microalbumin  Encounter for immunization -     Cancel: Flu Vaccine MDCK QUAD PF  Need for immunization against influenza -     Flu Vaccine QUAD High Dose(Fluad)  Pain management -     oxyCODONE (ROXICODONE) 15 MG immediate release tablet; Take 1 tablet (15 mg total) by mouth in the morning, at noon, and at bedtime.  Chronic pain syndrome -     oxyCODONE (ROXICODONE) 15 MG immediate release tablet; Take 1 tablet (15 mg total) by mouth in the morning, at noon, and at bedtime.  Class 3 severe obesity due to excess calories with serious  comorbidity and body mass index (BMI) of 40.0 to 44.9 in adult (HCC) -     tirzepatide (MOUNJARO) 7.5 MG/0.5ML Pen; Inject 7.5 mg into the skin once a week.  GAD (generalized anxiety disorder) -     busPIRone (BUSPAR) 10 MG tablet; Take 1 tablet (10 mg total) by mouth 3 (three) times daily.  Chronic respiratory failure with hypoxia (HCC)   A1C is great Will switch at patients request to mounjaro from ozempic On statin BP to goal Flu shot given  Covid booster to get at pharmacy Eye exam UTD Follow up in 3 months  On pain contract UTD .[name] KitchenPDMP reviewed during this encounter.  Refilled oxy with post date of 02/2022  Discussed anxiety added buspar Taper off hydroxyzine  [name], PA-C

## 2022-02-21 ENCOUNTER — Telehealth: Payer: Self-pay

## 2022-02-21 NOTE — Telephone Encounter (Addendum)
Initiated Prior authorization LSL:[id number] 7.'5MG'$ /0.5ML pen-injectors Via: Covermymeds Case/Key:[id number] Status: approved  as of 02/21/22 Reason:Approved through 02/21/2023 Notified Pt via: Mychart

## 2022-02-22 DIAGNOSIS — E1142 Type 2 diabetes mellitus with diabetic polyneuropathy: Secondary | ICD-10-CM | POA: Diagnosis not present

## 2022-02-22 DIAGNOSIS — L602 Onychogryphosis: Secondary | ICD-10-CM | POA: Diagnosis not present

## 2022-02-24 DIAGNOSIS — D3500 Benign neoplasm of unspecified adrenal gland: Secondary | ICD-10-CM | POA: Diagnosis not present

## 2022-02-27 ENCOUNTER — Other Ambulatory Visit: Payer: Self-pay | Admitting: Physician Assistant

## 2022-02-27 DIAGNOSIS — Z780 Asymptomatic menopausal state: Secondary | ICD-10-CM

## 2022-02-27 DIAGNOSIS — Z13820 Encounter for screening for osteoporosis: Secondary | ICD-10-CM

## 2022-02-28 DIAGNOSIS — E278 Other specified disorders of adrenal gland: Secondary | ICD-10-CM | POA: Diagnosis not present

## 2022-02-28 DIAGNOSIS — N281 Cyst of kidney, acquired: Secondary | ICD-10-CM | POA: Diagnosis not present

## 2022-02-28 DIAGNOSIS — Q891 Congenital malformations of adrenal gland: Secondary | ICD-10-CM | POA: Diagnosis not present

## 2022-02-28 DIAGNOSIS — K5730 Diverticulosis of large intestine without perforation or abscess without bleeding: Secondary | ICD-10-CM | POA: Diagnosis not present

## 2022-02-28 DIAGNOSIS — K760 Fatty (change of) liver, not elsewhere classified: Secondary | ICD-10-CM | POA: Diagnosis not present

## 2022-03-01 DIAGNOSIS — E538 Deficiency of other specified B group vitamins: Secondary | ICD-10-CM | POA: Diagnosis not present

## 2022-03-02 DIAGNOSIS — M5136 Other intervertebral disc degeneration, lumbar region: Secondary | ICD-10-CM | POA: Diagnosis not present

## 2022-03-02 DIAGNOSIS — M4802 Spinal stenosis, cervical region: Secondary | ICD-10-CM | POA: Diagnosis not present

## 2022-03-02 DIAGNOSIS — M7552 Bursitis of left shoulder: Secondary | ICD-10-CM | POA: Diagnosis not present

## 2022-03-02 DIAGNOSIS — M50322 Other cervical disc degeneration at C5-C6 level: Secondary | ICD-10-CM | POA: Diagnosis not present

## 2022-03-02 DIAGNOSIS — G894 Chronic pain syndrome: Secondary | ICD-10-CM | POA: Diagnosis not present

## 2022-03-02 DIAGNOSIS — M170 Bilateral primary osteoarthritis of knee: Secondary | ICD-10-CM | POA: Diagnosis not present

## 2022-03-05 ENCOUNTER — Other Ambulatory Visit: Payer: Self-pay | Admitting: Physician Assistant

## 2022-03-05 ENCOUNTER — Encounter: Payer: Self-pay | Admitting: Physician Assistant

## 2022-03-05 DIAGNOSIS — F332 Major depressive disorder, recurrent severe without psychotic features: Secondary | ICD-10-CM

## 2022-03-06 ENCOUNTER — Other Ambulatory Visit: Payer: Self-pay | Admitting: Physician Assistant

## 2022-03-09 DIAGNOSIS — M5136 Other intervertebral disc degeneration, lumbar region: Secondary | ICD-10-CM | POA: Diagnosis not present

## 2022-03-09 DIAGNOSIS — M170 Bilateral primary osteoarthritis of knee: Secondary | ICD-10-CM | POA: Diagnosis not present

## 2022-03-09 DIAGNOSIS — M50322 Other cervical disc degeneration at C5-C6 level: Secondary | ICD-10-CM | POA: Diagnosis not present

## 2022-03-09 DIAGNOSIS — G894 Chronic pain syndrome: Secondary | ICD-10-CM | POA: Diagnosis not present

## 2022-03-09 DIAGNOSIS — M7552 Bursitis of left shoulder: Secondary | ICD-10-CM | POA: Diagnosis not present

## 2022-03-09 DIAGNOSIS — M4802 Spinal stenosis, cervical region: Secondary | ICD-10-CM | POA: Diagnosis not present

## 2022-03-16 ENCOUNTER — Other Ambulatory Visit: Payer: Self-pay | Admitting: Physician Assistant

## 2022-03-16 DIAGNOSIS — J0140 Acute pansinusitis, unspecified: Secondary | ICD-10-CM

## 2022-04-05 DIAGNOSIS — E538 Deficiency of other specified B group vitamins: Secondary | ICD-10-CM | POA: Diagnosis not present

## 2022-04-06 DIAGNOSIS — E278 Other specified disorders of adrenal gland: Secondary | ICD-10-CM | POA: Diagnosis not present

## 2022-04-11 ENCOUNTER — Encounter: Payer: Self-pay | Admitting: Physician Assistant

## 2022-04-12 ENCOUNTER — Telehealth: Payer: Medicare Other | Admitting: Family

## 2022-04-12 DIAGNOSIS — J441 Chronic obstructive pulmonary disease with (acute) exacerbation: Secondary | ICD-10-CM

## 2022-04-12 MED ORDER — AZITHROMYCIN 250 MG PO TABS: ORAL_TABLET | ORAL | 0 refills | Status: DC

## 2022-04-12 MED ORDER — PREDNISONE 10 MG (21) PO TBPK: ORAL_TABLET | ORAL | 0 refills | Status: DC

## 2022-04-12 MED ORDER — BENZONATATE 100 MG PO CAPS: 100.0000 mg | ORAL_CAPSULE | Freq: Three times a day (TID) | ORAL | 0 refills | Status: DC | PRN

## 2022-04-12 NOTE — Progress Notes (Signed)
Virtual Visit Consent   [name], you are scheduled for a virtual visit with a [facility] provider today. Just as with appointments in the office, your consent must be obtained to participate. Your consent will be active for this visit and any virtual visit you may have with one of our providers in the next 365 days. If you have a MyChart account, a copy of this consent can be sent to you electronically.  As this is a virtual visit, video technology does not allow for your provider to perform a traditional examination. This may limit your provider's ability to fully assess your condition. If your provider identifies any concerns that need to be evaluated in person or the need to arrange testing (such as labs, EKG, etc.), we will make arrangements to do so. Although advances in technology are sophisticated, we cannot ensure that it will always work on either your end or our end. If the connection with a video visit is poor, the visit may have to be switched to a telephone visit. With either a video or telephone visit, we are not always able to ensure that we have a secure connection.  By engaging in this virtual visit, you consent to the provision of healthcare and authorize for your insurance to be billed (if applicable) for the services provided during this visit. Depending on your insurance coverage, you may receive a charge related to this service.  I need to obtain your verbal consent now. Are you willing to proceed with your visit today? Sarah Wright has provided verbal consent on 04/12/2022 for a virtual visit (video or telephone). Evelina Dun, FNP  Date: 04/12/2022 5:31 PM  Virtual Visit via Video Note   I, Evelina Dun, connected with  Sarah Wright  ([id number], Nov 06, 1945) on 04/12/22 at  5:45 PM EST by a video-enabled telemedicine application and verified that I am speaking with the correct person using two identifiers.  Location: Patient: Virtual Visit Location Patient:  Home Provider: Virtual Visit Location Provider: Home Office   I discussed the limitations of evaluation and management by telemedicine and the availability of in person appointments. The patient expressed understanding and agreed to proceed.    History of Present Illness: Sarah Wright is a 76 y.o. who identifies as a female who was assigned female at birth, and is being seen today for cough and congestion that started three days ago. Home COVID test is negative.   HPI: URI  This is a new problem. The current episode started in the past 7 days. The problem has been gradually worsening. There has been no fever. Associated symptoms include congestion, coughing, headaches, rhinorrhea, sinus pain, sneezing, a sore throat and wheezing. Pertinent negatives include no ear pain. She has tried acetaminophen and decongestant for the symptoms.  Cough This is a new problem. The current episode started yesterday. The problem has been waxing and waning. The problem occurs every few minutes. The cough is Productive of sputum. Associated symptoms include headaches, nasal congestion, rhinorrhea, a sore throat and wheezing. Pertinent negatives include no ear pain or fever. She has tried prescription cough suppressant and rest for the symptoms. The treatment provided mild relief. Her past medical history is significant for COPD.    Problems:  Patient Active Problem List   Diagnosis Date Noted   Chronic respiratory failure with hypoxia (Home) 02/20/2022   GAD (generalized anxiety disorder) 02/20/2022   Bilateral primary osteoarthritis of knee 12/28/2021   Fall 12/19/2021   Fatty liver disease, nonalcoholic 37/16/9678  Left adrenal mass (HCC) 11/08/2021   SOB (shortness of breath) on exertion 10/26/2021   Hypoxia 10/26/2021   Lung crackles 10/26/2021   Pain management 07/26/2021   Callus of toe 07/15/2021   Bilateral impacted cerumen 06/14/2021   Orthostatic hypotension 03/22/2021   Severe episode of  recurrent major depressive disorder, without psychotic features ([place]) 08/20/2020   Excessive crying 08/20/2020   Right ankle pain 01/20/2020   COPD exacerbation ([place]) 08/14/2019   RLS (restless legs syndrome) 08/12/2018   Skin abrasion 08/01/2017   Leukocytes in urine 08/01/2017   Dysuria 08/01/2017   Panic attack 08/01/2017   Chronic pain syndrome 05/06/2017   Microalbuminuria 01/30/2017   DDD (degenerative disc disease), cervical 12/29/2016   Numbness around mouth 09/08/2016   Balance problem 08/13/2016   Acute pain of left shoulder 08/13/2016   Right shoulder injury 01/27/2016   Psoriasis 09/23/2015   Memory loss 07/29/2015   Thyroid nodule 06/22/2015   Sensorineural hearing loss of both ears 01/26/2015   Dizziness 01/04/2015   Class 3 severe obesity due to excess calories with serious comorbidity and body mass index (BMI) of 40.0 to 44.9 in adult [facility]) 10/07/2014   Primary osteoarthritis of both knees 07/21/2014   H/O colonoscopy 04/02/2014   Thyroid disease 04/02/2014   Cataract 04/02/2014   Type 2 diabetes mellitus ([place]) 04/02/2014   Chronic venous insufficiency 03/27/2014   Lumbar spondylosis 03/17/2014   Aortic atherosclerosis ([place]) 03/17/2014   Anxiety and depression 03/16/2014   Dyslipidemia, goal LDL below 70 03/16/2014   COLD (chronic obstructive lung disease) ([place]) 03/16/2014   Insomnia 03/16/2014   Esophageal reflux 03/16/2014   Bilateral hand pain 03/16/2014   DDD (degenerative disc disease), lumbar 03/16/2014    Allergies:  Allergies  Allergen Reactions   Molnupiravir Nausea Only    headache   Sulfa Antibiotics     Patient had reaction as a child; does not know what type of reaction   Medications:  Current Outpatient Medications:    azithromycin (ZITHROMAX) 250 MG tablet, Take 500 mg once, then 250 mg for four days, Disp: 6 tablet, Rfl: 0   benzonatate (TESSALON PERLES) 100 MG capsule, Take 1 capsule (100 mg total) by mouth 3 (three) times daily as  needed., Disp: 20 capsule, Rfl: 0   predniSONE (STERAPRED UNI-PAK 21 TAB) 10 MG (21) TBPK tablet, Use as directed, Disp: 21 tablet, Rfl: 0   aspirin 81 MG tablet, Take 81 mg by mouth daily., Disp: , Rfl:    atorvastatin (LIPITOR) 20 MG tablet, TAKE 1 TABLET BY MOUTH EVERY DAY ., Disp: 90 tablet, Rfl: 1   BREZTRI AEROSPHERE 160-9-4.8 MCG/ACT AERO, INHALE 2 PUFFS INTO THE LUNGS IN THE MORNING AND AT BEDTIME., Disp: 10.7 g, Rfl: 4   buPROPion (WELLBUTRIN XL) 150 MG 24 hr tablet, TAKE 2 TABLETS BY MOUTH DAILY, Disp: 180 tablet, Rfl: 1   busPIRone (BUSPAR) 10 MG tablet, Take 1 tablet (10 mg total) by mouth 3 (three) times daily., Disp: 180 tablet, Rfl: 1   cyclobenzaprine (FLEXERIL) 10 MG tablet, TAKE ONE TABLET (10 MG DOSE) BY MOUTH 3 TIMES A DAY AS NEEDED FOR MUSCLE SPASMS FOR UP TO 30 DAYS., Disp: , Rfl:    donepezil (ARICEPT) 10 MG tablet, TAKE 1 TABLET BY MOUTH EVERY DAY, Disp: 90 tablet, Rfl: 3   DULoxetine (CYMBALTA) 60 MG capsule, TAKE 1 CAPSULE BY MOUTH TWICE A DAY, Disp: 180 capsule, Rfl: 1   fluticasone (FLONASE) 50 MCG/ACT nasal spray, SPRAY 2 SPRAYS INTO [facility]  NOSTRIL EVERY DAY, Disp: 48 mL, Rfl: 1   furosemide (LASIX) 20 MG tablet, Take daily as needed for lower extremity swelling. Needs appointment for another refill, Disp: 90 tablet, Rfl: 0   hydrOXYzine (ATARAX) 25 MG tablet, TAKE 1 TABLET BY MOUTH AS NEEDED UP TO TWICE A DAY, Disp: 180 tablet, Rfl: 1   ipratropium-albuterol (DUONEB) 0.5-2.5 (3) MG/3ML SOLN, Take 3 mLs by nebulization every 4 (four) hours as needed., Disp: 360 mL, Rfl: 1   levalbuterol (XOPENEX HFA) 45 MCG/ACT inhaler, INHALE 1 TO 2 PUFFS BY MOUTH EVERY 6 HOURS AS NEEDED FOR WHEEZE, Disp: 15 each, Rfl: 5   meloxicam (MOBIC) 15 MG tablet, TAKE 1 TABLET BY MOUTH EVERY DAY, Disp: 90 tablet, Rfl: 0   montelukast (SINGULAIR) 10 MG tablet, TAKE 1 TABLET BY MOUTH EVERYDAY AT BEDTIME, Disp: 90 tablet, Rfl: 4   OLANZapine (ZYPREXA) 7.5 MG tablet, TAKE 1 TABLET BY MOUTH EVERYDAY AT  BEDTIME, Disp: 90 tablet, Rfl: 0   ondansetron (ZOFRAN) 8 MG tablet, TAKE 1 TABLET BY MOUTH EVERY 8 HOURS AS NEEDED FOR NAUSEA AND VOMITING, Disp: 20 tablet, Rfl: 2   rOPINIRole (REQUIP) 2 MG tablet, TAKE 1 TABLET BY MOUTH EVERYDAY AT BEDTIME, Disp: 90 tablet, Rfl: 0   tirzepatide (MOUNJARO) 7.5 MG/0.5ML Pen, Inject 7.5 mg into the skin once a week., Disp: 6 mL, Rfl: 0   topiramate (TOPAMAX) 50 MG tablet, TAKE 1 TABLET BY MOUTH TWICE A DAY, Disp: 180 tablet, Rfl: 1   traZODone (DESYREL) 50 MG tablet, TAKE 0.5-1 TABLETS BY MOUTH AT BEDTIME AS NEEDED FOR SLEEP., Disp: 90 tablet, Rfl: 1  Observations/Objective: Patient is well-developed, well-nourished in no acute distress.  Resting comfortably  at home.  Head is normocephalic, atraumatic.  No labored breathing.  Speech is clear and coherent with logical content.  Patient is alert and oriented at baseline.  Nasal congestion  Coarse cough  Assessment and Plan: 1. COPD exacerbation (HCC) - predniSONE (STERAPRED UNI-PAK 21 TAB) 10 MG (21) TBPK tablet; Use as directed  Dispense: 21 tablet; Refill: 0 - benzonatate (TESSALON PERLES) 100 MG capsule; Take 1 capsule (100 mg total) by mouth 3 (three) times daily as needed.  Dispense: 20 capsule; Refill: 0 - azithromycin (ZITHROMAX) 250 MG tablet; Take 500 mg once, then 250 mg for four days  Dispense: 6 tablet; Refill: 0  - Take meds as prescribed - Use a cool mist humidifier  -Use saline nose sprays frequently -Force fluids -For any cough or congestion  Use plain Mucinex- regular strength or max strength is fine -For fever or aces or pains- take tylenol or ibuprofen. -Throat lozenges if help -Follow up if symptoms worsen or do not improve   Follow Up Instructions: I discussed the assessment and treatment plan with the patient. The patient was provided an opportunity to ask questions and all were answered. The patient agreed with the plan and demonstrated an understanding of the instructions.  A  copy of instructions were sent to the patient via MyChart unless otherwise noted below.     The patient was advised to call back or seek an in-person evaluation if the symptoms worsen or if the condition fails to improve as anticipated.  Time:  I spent 11 minutes with the patient via telehealth technology discussing the above problems/concerns.    Evelina Dun, FNP

## 2022-04-15 ENCOUNTER — Other Ambulatory Visit: Payer: Self-pay | Admitting: Physician Assistant

## 2022-04-15 DIAGNOSIS — F332 Major depressive disorder, recurrent severe without psychotic features: Secondary | ICD-10-CM

## 2022-04-15 DIAGNOSIS — F32A Depression, unspecified: Secondary | ICD-10-CM

## 2022-04-15 DIAGNOSIS — F411 Generalized anxiety disorder: Secondary | ICD-10-CM

## 2022-04-20 ENCOUNTER — Encounter: Payer: Self-pay | Admitting: Physician Assistant

## 2022-04-20 DIAGNOSIS — G894 Chronic pain syndrome: Secondary | ICD-10-CM

## 2022-04-20 DIAGNOSIS — R52 Pain, unspecified: Secondary | ICD-10-CM

## 2022-04-20 MED ORDER — OXYCODONE HCL 15 MG PO TABS: 15.0000 mg | ORAL_TABLET | Freq: Three times a day (TID) | ORAL | 0 refills | Status: DC

## 2022-04-20 NOTE — Telephone Encounter (Signed)
UTD pain contract.  ..PDMP reviewed during this encounter. No concerns.  UTD office visit.  Refill sent.

## 2022-04-21 ENCOUNTER — Other Ambulatory Visit: Payer: Self-pay | Admitting: Physician Assistant

## 2022-04-21 DIAGNOSIS — E7849 Other hyperlipidemia: Secondary | ICD-10-CM

## 2022-04-21 DIAGNOSIS — F410 Panic disorder [episodic paroxysmal anxiety] without agoraphobia: Secondary | ICD-10-CM

## 2022-04-30 ENCOUNTER — Encounter: Payer: Self-pay | Admitting: Physician Assistant

## 2022-05-01 ENCOUNTER — Other Ambulatory Visit: Payer: Self-pay | Admitting: Physician Assistant

## 2022-05-01 DIAGNOSIS — G2581 Restless legs syndrome: Secondary | ICD-10-CM

## 2022-05-01 MED ORDER — MOUNJARO 10 MG/0.5ML ~~LOC~~ SOAJ: 10.0000 mg | SUBCUTANEOUS | 0 refills | Status: DC

## 2022-05-04 DIAGNOSIS — E538 Deficiency of other specified B group vitamins: Secondary | ICD-10-CM | POA: Diagnosis not present

## 2022-05-04 DIAGNOSIS — R2689 Other abnormalities of gait and mobility: Secondary | ICD-10-CM | POA: Diagnosis not present

## 2022-05-05 DIAGNOSIS — M2041 Other hammer toe(s) (acquired), right foot: Secondary | ICD-10-CM | POA: Diagnosis not present

## 2022-05-05 DIAGNOSIS — E1159 Type 2 diabetes mellitus with other circulatory complications: Secondary | ICD-10-CM | POA: Diagnosis not present

## 2022-05-05 DIAGNOSIS — L97511 Non-pressure chronic ulcer of other part of right foot limited to breakdown of skin: Secondary | ICD-10-CM | POA: Diagnosis not present

## 2022-05-05 DIAGNOSIS — E1142 Type 2 diabetes mellitus with diabetic polyneuropathy: Secondary | ICD-10-CM | POA: Diagnosis not present

## 2022-05-05 DIAGNOSIS — L602 Onychogryphosis: Secondary | ICD-10-CM | POA: Diagnosis not present

## 2022-05-05 DIAGNOSIS — M21611 Bunion of right foot: Secondary | ICD-10-CM | POA: Diagnosis not present

## 2022-05-08 DIAGNOSIS — R918 Other nonspecific abnormal finding of lung field: Secondary | ICD-10-CM | POA: Diagnosis not present

## 2022-05-08 DIAGNOSIS — G4733 Obstructive sleep apnea (adult) (pediatric): Secondary | ICD-10-CM | POA: Diagnosis not present

## 2022-05-08 DIAGNOSIS — Z9981 Dependence on supplemental oxygen: Secondary | ICD-10-CM | POA: Diagnosis not present

## 2022-05-08 DIAGNOSIS — J432 Centrilobular emphysema: Secondary | ICD-10-CM | POA: Diagnosis not present

## 2022-05-08 DIAGNOSIS — J9611 Chronic respiratory failure with hypoxia: Secondary | ICD-10-CM | POA: Diagnosis not present

## 2022-05-08 DIAGNOSIS — Z01818 Encounter for other preprocedural examination: Secondary | ICD-10-CM | POA: Diagnosis not present

## 2022-05-11 ENCOUNTER — Telehealth: Payer: Self-pay | Admitting: Neurology

## 2022-05-11 NOTE — Telephone Encounter (Signed)
Received call from [facility] with [employer] [phone] ext [phone]) wanting to know patient's last appt day and next scheduled date. Called back and LVM with information.

## 2022-05-13 ENCOUNTER — Other Ambulatory Visit: Payer: Self-pay | Admitting: Physician Assistant

## 2022-05-20 ENCOUNTER — Other Ambulatory Visit: Payer: Self-pay | Admitting: Physician Assistant

## 2022-05-20 ENCOUNTER — Telehealth: Payer: Medicare Other | Admitting: Family Medicine

## 2022-05-20 DIAGNOSIS — J069 Acute upper respiratory infection, unspecified: Secondary | ICD-10-CM

## 2022-05-20 DIAGNOSIS — J0140 Acute pansinusitis, unspecified: Secondary | ICD-10-CM | POA: Diagnosis not present

## 2022-05-20 DIAGNOSIS — J441 Chronic obstructive pulmonary disease with (acute) exacerbation: Secondary | ICD-10-CM

## 2022-05-20 DIAGNOSIS — M25512 Pain in left shoulder: Secondary | ICD-10-CM

## 2022-05-20 MED ORDER — BENZONATATE 100 MG PO CAPS: 100.0000 mg | ORAL_CAPSULE | Freq: Three times a day (TID) | ORAL | 0 refills | Status: DC | PRN

## 2022-05-20 MED ORDER — FLUTICASONE PROPIONATE 50 MCG/ACT NA SUSP: NASAL | 1 refills | Status: AC

## 2022-05-20 NOTE — Progress Notes (Signed)

## 2022-05-24 ENCOUNTER — Telehealth (INDEPENDENT_AMBULATORY_CARE_PROVIDER_SITE_OTHER): Payer: Medicare Other | Admitting: Physician Assistant

## 2022-05-24 ENCOUNTER — Encounter: Payer: Self-pay | Admitting: Physician Assistant

## 2022-05-24 DIAGNOSIS — J441 Chronic obstructive pulmonary disease with (acute) exacerbation: Secondary | ICD-10-CM

## 2022-05-24 DIAGNOSIS — J9611 Chronic respiratory failure with hypoxia: Secondary | ICD-10-CM

## 2022-05-24 MED ORDER — DOXYCYCLINE HYCLATE 100 MG PO TABS: 100.0000 mg | ORAL_TABLET | Freq: Two times a day (BID) | ORAL | 0 refills | Status: DC

## 2022-05-24 MED ORDER — PREDNISONE 20 MG PO TABS: ORAL_TABLET | ORAL | 0 refills | Status: DC

## 2022-05-24 NOTE — Progress Notes (Signed)
..Virtual Visit via Telephone Note  I connected with Sarah Wright on 05/24/22 at 11:30 AM EST by telephone and verified that I am speaking with the correct person using two identifiers.  Location: Patient: home Provider: clinic  .Sarah KitchenParticipating in visit:  Patient: Sarah Wright Provider:Mylen Mangan PA-C    I discussed the limitations, risks, security and privacy concerns of performing an evaluation and management service by telephone and the availability of in person appointments. I also discussed with the patient that there may be a patient responsible charge related to this service. The patient expressed understanding and agreed to proceed.   History of Present Illness: Pt is a 77 yo obese female with COPD and chronic respiratory failure and on continuous O2 at 2-3L. She has been struggling with cough, chest tightness, shortness of breath for the last 2 weeks.  She did have a virtual visit with a nurse practitioner on Friday and they gave her [place].  Her cough has not improved and her production has increased.  She is becoming more more labored breathing.  She has not been able to check her pulse ox at home or her vitals.  She denies any fever.  She is on 2 to 3 L of continuous oxygen.  She continues to use her nebulizers and her maintenance inhaler.  She denies any fever, chills, body aches.  She does have an upcoming surgery the beginning of February that she is trying to get better for.  She has been taking some Mucinex.  She finds this just dries out her sinuses but has not been helping her shortness of breath and chest tightness.   .. Active Ambulatory Problems    Diagnosis Date Noted   Anxiety and depression 03/16/2014   Dyslipidemia, goal LDL below 70 03/16/2014   COLD (chronic obstructive lung disease) ([place]) 03/16/2014   Insomnia 03/16/2014   Esophageal reflux 03/16/2014   Bilateral hand pain 03/16/2014   DDD (degenerative disc disease), lumbar 03/16/2014   Lumbar  spondylosis 03/17/2014   Aortic atherosclerosis ([place]) 03/17/2014   Chronic venous insufficiency 03/27/2014   H/O colonoscopy 04/02/2014   Thyroid disease 04/02/2014   Cataract 04/02/2014   Type 2 diabetes mellitus ([place]) 04/02/2014   Primary osteoarthritis of both knees 07/21/2014   Class 3 severe obesity due to excess calories with serious comorbidity and body mass index (BMI) of 40.0 to 44.9 in adult [facility]) 10/07/2014   Dizziness 01/04/2015   Sensorineural hearing loss of both ears 01/26/2015   Thyroid nodule 06/22/2015   Memory loss 07/29/2015   Psoriasis 09/23/2015   Right shoulder injury 01/27/2016   Balance problem 08/13/2016   Acute pain of left shoulder 08/13/2016   Numbness around mouth 09/08/2016   DDD (degenerative disc disease), cervical 12/29/2016   Microalbuminuria 01/30/2017   Chronic pain syndrome 05/06/2017   Skin abrasion 08/01/2017   Leukocytes in urine 08/01/2017   Dysuria 08/01/2017   Panic attack 08/01/2017   RLS (restless legs syndrome) 08/12/2018   COPD exacerbation ([place]) 08/14/2019   Right ankle pain 01/20/2020   Severe episode of recurrent major depressive disorder, without psychotic features ([place]) 08/20/2020   Excessive crying 08/20/2020   Orthostatic hypotension 03/22/2021   Bilateral impacted cerumen 06/14/2021   Callus of toe 07/15/2021   Pain management 07/26/2021   SOB (shortness of breath) on exertion 10/26/2021   Hypoxia 10/26/2021   Lung crackles 10/26/2021   Left adrenal mass ([place]) 11/08/2021   Fatty liver disease, nonalcoholic 35/57/3220   Fall 12/19/2021   Bilateral  primary osteoarthritis of knee 12/28/2021   Chronic respiratory failure with hypoxia ([place]) 02/20/2022   GAD (generalized anxiety disorder) 02/20/2022   Resolved Ambulatory Problems    Diagnosis Date Noted   No Resolved Ambulatory Problems   Past Medical History:  Diagnosis Date   Diabetes mellitus without complication ([place])    Hyperlipidemia         Observations/Objective: No acute distress Productive cough on phone Slightly labored breathing  Assessment and Plan: Sarah KitchenMarland KitchenLuanne was seen today for cough.  Diagnoses and all orders for this visit:  COPD exacerbation ([place]) -     predniSONE (DELTASONE) 20 MG tablet; Take 3 tablets for 3 days, take 2 tablets for 3 days, take 1 tablet for 3 days, take 1/2 tablet for 3 days. -     doxycycline (VIBRA-TABS) 100 MG tablet; Take 1 tablet (100 mg total) by mouth 2 (two) times daily.  Chronic respiratory failure with hypoxia (HCC) -     predniSONE (DELTASONE) 20 MG tablet; Take 3 tablets for 3 days, take 2 tablets for 3 days, take 1 tablet for 3 days, take 1/2 tablet for 3 days. -     doxycycline (VIBRA-TABS) 100 MG tablet; Take 1 tablet (100 mg total) by mouth 2 (two) times daily.   Pt has COPD and in exacerbation today Prednisone taper and doxycycline sent to pharmacy Continue 3L O2, continuous Encouraged regular duoneb/albuterol treatments at least every 2-3 hours Follow up as needed or if symptoms worsen    Follow Up Instructions:    I discussed the assessment and treatment plan with the patient. The patient was provided an opportunity to ask questions and all were answered. The patient agreed with the plan and demonstrated an understanding of the instructions.   The patient was advised to call back or seek an in-person evaluation if the symptoms worsen or if the condition fails to improve as anticipated.  I provided  10 minutes of non-face-to-face time during this encounter.   Sarah Planas, PA-C

## 2022-05-26 ENCOUNTER — Other Ambulatory Visit: Payer: Self-pay | Admitting: Physician Assistant

## 2022-05-26 DIAGNOSIS — E1142 Type 2 diabetes mellitus with diabetic polyneuropathy: Secondary | ICD-10-CM

## 2022-05-29 DIAGNOSIS — Z01818 Encounter for other preprocedural examination: Secondary | ICD-10-CM | POA: Diagnosis not present

## 2022-05-29 DIAGNOSIS — D497 Neoplasm of unspecified behavior of endocrine glands and other parts of nervous system: Secondary | ICD-10-CM | POA: Diagnosis not present

## 2022-05-29 DIAGNOSIS — Z9981 Dependence on supplemental oxygen: Secondary | ICD-10-CM | POA: Diagnosis not present

## 2022-05-29 DIAGNOSIS — G4733 Obstructive sleep apnea (adult) (pediatric): Secondary | ICD-10-CM | POA: Diagnosis not present

## 2022-05-29 DIAGNOSIS — F332 Major depressive disorder, recurrent severe without psychotic features: Secondary | ICD-10-CM | POA: Diagnosis not present

## 2022-05-29 DIAGNOSIS — E669 Obesity, unspecified: Secondary | ICD-10-CM | POA: Diagnosis not present

## 2022-05-29 DIAGNOSIS — E119 Type 2 diabetes mellitus without complications: Secondary | ICD-10-CM | POA: Diagnosis not present

## 2022-05-29 DIAGNOSIS — Z01812 Encounter for preprocedural laboratory examination: Secondary | ICD-10-CM | POA: Diagnosis not present

## 2022-05-29 DIAGNOSIS — Z6836 Body mass index (BMI) 36.0-36.9, adult: Secondary | ICD-10-CM | POA: Diagnosis not present

## 2022-05-29 DIAGNOSIS — R54 Age-related physical debility: Secondary | ICD-10-CM | POA: Diagnosis not present

## 2022-05-29 DIAGNOSIS — R413 Other amnesia: Secondary | ICD-10-CM | POA: Diagnosis not present

## 2022-05-29 DIAGNOSIS — J449 Chronic obstructive pulmonary disease, unspecified: Secondary | ICD-10-CM | POA: Diagnosis not present

## 2022-05-29 DIAGNOSIS — T148XXA Other injury of unspecified body region, initial encounter: Secondary | ICD-10-CM | POA: Diagnosis not present

## 2022-05-29 DIAGNOSIS — E278 Other specified disorders of adrenal gland: Secondary | ICD-10-CM | POA: Diagnosis not present

## 2022-05-30 ENCOUNTER — Other Ambulatory Visit: Payer: Self-pay | Admitting: Physician Assistant

## 2022-06-01 ENCOUNTER — Other Ambulatory Visit: Payer: Self-pay | Admitting: Physician Assistant

## 2022-06-01 DIAGNOSIS — F332 Major depressive disorder, recurrent severe without psychotic features: Secondary | ICD-10-CM

## 2022-06-05 DIAGNOSIS — E1142 Type 2 diabetes mellitus with diabetic polyneuropathy: Secondary | ICD-10-CM | POA: Diagnosis not present

## 2022-06-05 DIAGNOSIS — J9611 Chronic respiratory failure with hypoxia: Secondary | ICD-10-CM | POA: Diagnosis not present

## 2022-06-05 DIAGNOSIS — M21611 Bunion of right foot: Secondary | ICD-10-CM | POA: Diagnosis not present

## 2022-06-05 DIAGNOSIS — R0609 Other forms of dyspnea: Secondary | ICD-10-CM | POA: Diagnosis not present

## 2022-06-05 DIAGNOSIS — L97512 Non-pressure chronic ulcer of other part of right foot with fat layer exposed: Secondary | ICD-10-CM | POA: Diagnosis not present

## 2022-06-06 ENCOUNTER — Encounter: Payer: Self-pay | Admitting: Physician Assistant

## 2022-06-06 DIAGNOSIS — G894 Chronic pain syndrome: Secondary | ICD-10-CM

## 2022-06-06 DIAGNOSIS — E538 Deficiency of other specified B group vitamins: Secondary | ICD-10-CM | POA: Diagnosis not present

## 2022-06-06 DIAGNOSIS — R52 Pain, unspecified: Secondary | ICD-10-CM

## 2022-06-07 MED ORDER — OXYCODONE HCL 15 MG PO TABS: 15.0000 mg | ORAL_TABLET | Freq: Three times a day (TID) | ORAL | 0 refills | Status: DC

## 2022-06-07 NOTE — Telephone Encounter (Signed)
..  PDMP reviewed during this encounter. No concerns Refilled oxycodone.

## 2022-06-10 ENCOUNTER — Other Ambulatory Visit: Payer: Self-pay | Admitting: Family

## 2022-06-10 ENCOUNTER — Other Ambulatory Visit: Payer: Self-pay | Admitting: Physician Assistant

## 2022-06-10 DIAGNOSIS — J9611 Chronic respiratory failure with hypoxia: Secondary | ICD-10-CM

## 2022-06-10 DIAGNOSIS — J441 Chronic obstructive pulmonary disease with (acute) exacerbation: Secondary | ICD-10-CM

## 2022-06-13 DIAGNOSIS — G4733 Obstructive sleep apnea (adult) (pediatric): Secondary | ICD-10-CM | POA: Diagnosis not present

## 2022-06-13 DIAGNOSIS — Z9981 Dependence on supplemental oxygen: Secondary | ICD-10-CM | POA: Diagnosis not present

## 2022-06-13 DIAGNOSIS — Z01818 Encounter for other preprocedural examination: Secondary | ICD-10-CM | POA: Diagnosis not present

## 2022-06-13 DIAGNOSIS — R918 Other nonspecific abnormal finding of lung field: Secondary | ICD-10-CM | POA: Diagnosis not present

## 2022-06-13 DIAGNOSIS — J9611 Chronic respiratory failure with hypoxia: Secondary | ICD-10-CM | POA: Diagnosis not present

## 2022-06-13 DIAGNOSIS — J432 Centrilobular emphysema: Secondary | ICD-10-CM | POA: Diagnosis not present

## 2022-06-15 DIAGNOSIS — F418 Other specified anxiety disorders: Secondary | ICD-10-CM | POA: Diagnosis not present

## 2022-06-15 DIAGNOSIS — M7122 Synovial cyst of popliteal space [Baker], left knee: Secondary | ICD-10-CM | POA: Diagnosis not present

## 2022-06-15 DIAGNOSIS — M7121 Synovial cyst of popliteal space [Baker], right knee: Secondary | ICD-10-CM | POA: Diagnosis not present

## 2022-06-15 DIAGNOSIS — E7800 Pure hypercholesterolemia, unspecified: Secondary | ICD-10-CM | POA: Diagnosis not present

## 2022-06-15 DIAGNOSIS — L409 Psoriasis, unspecified: Secondary | ICD-10-CM | POA: Diagnosis not present

## 2022-06-15 DIAGNOSIS — M47816 Spondylosis without myelopathy or radiculopathy, lumbar region: Secondary | ICD-10-CM | POA: Diagnosis not present

## 2022-06-15 DIAGNOSIS — E279 Disorder of adrenal gland, unspecified: Secondary | ICD-10-CM | POA: Diagnosis not present

## 2022-06-15 DIAGNOSIS — Z9049 Acquired absence of other specified parts of digestive tract: Secondary | ICD-10-CM | POA: Diagnosis not present

## 2022-06-15 DIAGNOSIS — E669 Obesity, unspecified: Secondary | ICD-10-CM | POA: Diagnosis not present

## 2022-06-15 DIAGNOSIS — K219 Gastro-esophageal reflux disease without esophagitis: Secondary | ICD-10-CM | POA: Diagnosis not present

## 2022-06-15 DIAGNOSIS — I10 Essential (primary) hypertension: Secondary | ICD-10-CM | POA: Diagnosis not present

## 2022-06-15 DIAGNOSIS — E890 Postprocedural hypothyroidism: Secondary | ICD-10-CM | POA: Diagnosis not present

## 2022-06-15 DIAGNOSIS — E119 Type 2 diabetes mellitus without complications: Secondary | ICD-10-CM | POA: Diagnosis not present

## 2022-06-15 DIAGNOSIS — Z882 Allergy status to sulfonamides status: Secondary | ICD-10-CM | POA: Diagnosis not present

## 2022-06-15 DIAGNOSIS — E278 Other specified disorders of adrenal gland: Secondary | ICD-10-CM | POA: Diagnosis not present

## 2022-06-15 DIAGNOSIS — G4730 Sleep apnea, unspecified: Secondary | ICD-10-CM | POA: Diagnosis not present

## 2022-06-15 DIAGNOSIS — M79661 Pain in right lower leg: Secondary | ICD-10-CM | POA: Diagnosis not present

## 2022-06-15 DIAGNOSIS — Z87891 Personal history of nicotine dependence: Secondary | ICD-10-CM | POA: Diagnosis not present

## 2022-06-15 DIAGNOSIS — M170 Bilateral primary osteoarthritis of knee: Secondary | ICD-10-CM | POA: Diagnosis not present

## 2022-06-15 DIAGNOSIS — F332 Major depressive disorder, recurrent severe without psychotic features: Secondary | ICD-10-CM | POA: Diagnosis not present

## 2022-06-15 DIAGNOSIS — K760 Fatty (change of) liver, not elsewhere classified: Secondary | ICD-10-CM | POA: Diagnosis not present

## 2022-06-15 DIAGNOSIS — J449 Chronic obstructive pulmonary disease, unspecified: Secondary | ICD-10-CM | POA: Diagnosis not present

## 2022-06-15 DIAGNOSIS — Z809 Family history of malignant neoplasm, unspecified: Secondary | ICD-10-CM | POA: Diagnosis not present

## 2022-06-15 DIAGNOSIS — M5136 Other intervertebral disc degeneration, lumbar region: Secondary | ICD-10-CM | POA: Diagnosis not present

## 2022-06-15 DIAGNOSIS — Z981 Arthrodesis status: Secondary | ICD-10-CM | POA: Diagnosis not present

## 2022-06-15 DIAGNOSIS — G4733 Obstructive sleep apnea (adult) (pediatric): Secondary | ICD-10-CM | POA: Diagnosis not present

## 2022-06-15 DIAGNOSIS — D4412 Neoplasm of uncertain behavior of left adrenal gland: Secondary | ICD-10-CM | POA: Diagnosis not present

## 2022-06-15 DIAGNOSIS — Z9981 Dependence on supplemental oxygen: Secondary | ICD-10-CM | POA: Diagnosis not present

## 2022-06-19 ENCOUNTER — Telehealth: Payer: Self-pay

## 2022-06-19 NOTE — Transitions of Care (Post Inpatient/ED Visit) (Unsigned)
   06/19/2022  Name: Sarah Wright MRN: [id number] DOB: 1946-04-23  Today's TOC FU Call Status: Today's TOC FU Call Status:: Unsuccessul Call (1st Attempt) Unsuccessful Call (1st Attempt) Date: 06/19/22  Attempted to reach the patient regarding the most recent Inpatient/ED visit.  Follow Up Plan: Additional outreach attempts will be made to reach the patient to complete the Transitions of Care (Post Inpatient/ED visit) call.   Signature Juanda Crumble, [place] Direct Dial [phone]

## 2022-06-20 ENCOUNTER — Encounter: Payer: Self-pay | Admitting: Physician Assistant

## 2022-06-20 MED ORDER — CYCLOBENZAPRINE HCL 10 MG PO TABS: ORAL_TABLET | ORAL | 1 refills | Status: DC

## 2022-06-21 NOTE — Transitions of Care (Post Inpatient/ED Visit) (Signed)
   06/21/2022  Name: Sarah Wright MRN: [id number] DOB: 1945/09/10  Today's TOC FU Call Status: Today's TOC FU Call Status:: Successful TOC FU Call Competed Unsuccessful Call (1st Attempt) Date: 06/19/22 [facility] FU Call Complete Date: 06/21/22  Transition Care Management Follow-up Telephone Call Date of Discharge: 06/18/22 Discharge Facility: Other ([place]) Name of Other ([facility]) Discharge Facility: [facility] Type of Discharge: Inpatient Admission Primary Inpatient Discharge Diagnosis:: adrenal gland disorder How have you been since you were released from the hospital?: Better Any questions or concerns?: No  Items Reviewed: Did you receive and understand the discharge instructions provided?: Yes Medications obtained and verified?: Yes (Medications Reviewed) Any new allergies since your discharge?: No Dietary orders reviewed?: Yes  Home Care and Equipment/Supplies: [place] Ordered?: NA Any new equipment or medical supplies ordered?: NA  Functional Questionnaire: Do you need assistance with bathing/showering or dressing?: No Do you need assistance with meal preparation?: No Do you need assistance with eating?: No Do you have difficulty maintaining continence: No Do you need assistance with getting out of bed/getting out of a chair/moving?: No Do you have difficulty managing or taking your medications?: No  Folllow up appointments reviewed: PCP Follow-up appointment confirmed?: NA Specialist Hospital Follow-up appointment confirmed?: Yes Date of Specialist follow-up appointment?: 06/29/22 Follow-Up Specialty Provider:: Dr Daymon Larsen Do you need transportation to your follow-up appointment?: No Do you understand care options if your condition(s) worsen?: Yes-patient verbalized understanding    [place], [place] Nurse Health Advisor Direct Dial [phone]

## 2022-06-23 ENCOUNTER — Other Ambulatory Visit: Payer: Self-pay | Admitting: Physician Assistant

## 2022-06-26 ENCOUNTER — Other Ambulatory Visit: Payer: Self-pay | Admitting: Physician Assistant

## 2022-06-26 DIAGNOSIS — F332 Major depressive disorder, recurrent severe without psychotic features: Secondary | ICD-10-CM

## 2022-06-28 DIAGNOSIS — Z9981 Dependence on supplemental oxygen: Secondary | ICD-10-CM | POA: Diagnosis not present

## 2022-06-28 DIAGNOSIS — M170 Bilateral primary osteoarthritis of knee: Secondary | ICD-10-CM | POA: Diagnosis not present

## 2022-06-28 DIAGNOSIS — J449 Chronic obstructive pulmonary disease, unspecified: Secondary | ICD-10-CM | POA: Diagnosis not present

## 2022-06-29 DIAGNOSIS — E278 Other specified disorders of adrenal gland: Secondary | ICD-10-CM | POA: Diagnosis not present

## 2022-07-02 ENCOUNTER — Encounter: Payer: Self-pay | Admitting: Physician Assistant

## 2022-07-04 MED ORDER — ROPINIROLE HCL 4 MG PO TABS: 4.0000 mg | ORAL_TABLET | Freq: Every day | ORAL | 1 refills | Status: DC

## 2022-07-04 NOTE — Addendum Note (Signed)
Addended by: Donella Stade on: 07/04/2022 04:11 PM   Modules accepted: Orders

## 2022-07-05 DIAGNOSIS — R2689 Other abnormalities of gait and mobility: Secondary | ICD-10-CM | POA: Diagnosis not present

## 2022-07-05 DIAGNOSIS — E538 Deficiency of other specified B group vitamins: Secondary | ICD-10-CM | POA: Diagnosis not present

## 2022-07-13 ENCOUNTER — Ambulatory Visit (INDEPENDENT_AMBULATORY_CARE_PROVIDER_SITE_OTHER): Payer: Medicare Other

## 2022-07-13 ENCOUNTER — Ambulatory Visit
Admission: EM | Admit: 2022-07-13 | Discharge: 2022-07-13 | Disposition: A | Discharge status: Home / Self Care | Payer: Medicare Other | Attending: Family Medicine | Admitting: Family Medicine

## 2022-07-13 DIAGNOSIS — S4992XA Unspecified injury of left shoulder and upper arm, initial encounter: Secondary | ICD-10-CM

## 2022-07-13 DIAGNOSIS — W19XXXA Unspecified fall, initial encounter: Secondary | ICD-10-CM | POA: Diagnosis not present

## 2022-07-13 DIAGNOSIS — S59912A Unspecified injury of left forearm, initial encounter: Secondary | ICD-10-CM | POA: Diagnosis not present

## 2022-07-13 DIAGNOSIS — M79602 Pain in left arm: Secondary | ICD-10-CM | POA: Diagnosis not present

## 2022-07-13 DIAGNOSIS — Y92009 Unspecified place in unspecified non-institutional (private) residence as the place of occurrence of the external cause: Secondary | ICD-10-CM | POA: Diagnosis not present

## 2022-07-13 DIAGNOSIS — M7989 Other specified soft tissue disorders: Secondary | ICD-10-CM | POA: Diagnosis not present

## 2022-07-13 DIAGNOSIS — S40022A Contusion of left upper arm, initial encounter: Secondary | ICD-10-CM

## 2022-07-13 NOTE — ED Provider Notes (Signed)
[facility]    CSN: [id number] Arrival date & time: 07/13/22  1049      History   Chief Complaint Chief Complaint  Patient presents with   Fall   Arm Injury    LT    HPI Samaree Kinner is a 77 y.o. female.   HPI  Patient fell on Sunday.  Is now Thursday.  She has bruising and pain that persists in her left arm.  She hit her left arm on the side table next to her bed.  No other complaints, no pain in neck or back.  No head injury  Last bone density was in 2016.  At that time it was normal  Past Medical History:  Diagnosis Date   Diabetes mellitus without complication ([place])    Hyperlipidemia    Thyroid disease     Patient Active Problem List   Diagnosis Date Noted   Chronic respiratory failure with hypoxia ([place]) 02/20/2022   GAD (generalized anxiety disorder) 02/20/2022   Bilateral primary osteoarthritis of knee 12/28/2021   Fall 12/19/2021   Fatty liver disease, nonalcoholic [id number]   Left adrenal mass ([place]) 11/08/2021   SOB (shortness of breath) on exertion 10/26/2021   Hypoxia 10/26/2021   Lung crackles 10/26/2021   Pain management 07/26/2021   Callus of toe 07/15/2021   Bilateral impacted cerumen 06/14/2021   Orthostatic hypotension 03/22/2021   Severe episode of recurrent major depressive disorder, without psychotic features ([place]) 08/20/2020   Excessive crying 08/20/2020   Right ankle pain 01/20/2020   COPD exacerbation ([place]) 08/14/2019   RLS (restless legs syndrome) 08/12/2018   Skin abrasion 08/01/2017   Leukocytes in urine 08/01/2017   Dysuria 08/01/2017   Panic attack 08/01/2017   Chronic pain syndrome 05/06/2017   Microalbuminuria 01/30/2017   DDD (degenerative disc disease), cervical 12/29/2016   Numbness around mouth 09/08/2016   Balance problem 08/13/2016   Acute pain of left shoulder 08/13/2016   Right shoulder injury 01/27/2016   Psoriasis 09/23/2015   Memory loss 07/29/2015   Thyroid nodule 06/22/2015   Sensorineural  hearing loss of both ears 01/26/2015   Dizziness 01/04/2015   Class 3 severe obesity due to excess calories with serious comorbidity and body mass index (BMI) of 40.0 to 44.9 in adult ([place]) 10/07/2014   Primary osteoarthritis of both knees 07/21/2014   H/O colonoscopy 04/02/2014   Thyroid disease 04/02/2014   Cataract 04/02/2014   Type 2 diabetes mellitus ([place]) 04/02/2014   Chronic venous insufficiency 03/27/2014   Lumbar spondylosis 03/17/2014   Aortic atherosclerosis ([place]) 03/17/2014   Anxiety and depression 03/16/2014   Dyslipidemia, goal LDL below 70 03/16/2014   COLD (chronic obstructive lung disease) ([place]) 03/16/2014   Insomnia 03/16/2014   Esophageal reflux 03/16/2014   Bilateral hand pain 03/16/2014   DDD (degenerative disc disease), lumbar 03/16/2014    Past Surgical History:  Procedure Laterality Date   ABDOMINAL HYSTERECTOMY     CARPAL TUNNEL RELEASE     both wrists   CATARACT EXTRACTION, BILATERAL     CERVICAL FUSION     GALLBLADDER SURGERY     right foot surgery     THYROIDECTOMY, PARTIAL     right side removed    OB History   No obstetric history on file.      Home Medications    Prior to Admission medications   Medication Sig Start Date End Date Taking? Authorizing Provider  aspirin 81 MG tablet Take 81 mg by mouth daily.  [provider]  atorvastatin (LIPITOR) 20 MG tablet TAKE 1 TABLET BY MOUTH EVERY DAY 04/21/22   Breeback, Jade L, PA-C  BREZTRI AEROSPHERE 160-9-4.8 MCG/ACT AERO INHALE 2 PUFFS INTO THE LUNGS IN THE MORNING AND AT BEDTIME. 02/13/22   Juanito Doom, MD  buPROPion (WELLBUTRIN XL) 150 MG 24 hr tablet TAKE 2 TABLETS BY MOUTH DAILY 04/18/22   Breeback, Jade L, PA-C  busPIRone (BUSPAR) 10 MG tablet TAKE 1 TABLET BY MOUTH THREE TIMES A DAY 04/18/22   Breeback, Jade L, PA-C  cyclobenzaprine (FLEXERIL) 10 MG tablet TAKE ONE TABLET (10 MG DOSE) BY MOUTH 3 TIMES A DAY AS NEEDED FOR MUSCLE SPASMS FOR UP TO 30 DAYS. 06/20/22    Breeback, Jade L, PA-C  donepezil (ARICEPT) 10 MG tablet TAKE 1 TABLET BY MOUTH EVERY DAY 02/20/22   Breeback, Jade L, PA-C  DULoxetine (CYMBALTA) 60 MG capsule TAKE 1 CAPSULE BY MOUTH TWICE A DAY 04/18/22   Breeback, Jade L, PA-C  fluticasone (FLONASE) 50 MCG/ACT nasal spray SPRAY 2 SPRAYS INTO EACH NOSTRIL EVERY DAY 05/20/22   Tempie Hoist, FNP  furosemide (LASIX) 20 MG tablet Take daily as needed for lower extremity swelling. Needs appointment for another refill 06/07/20   [name] L, PA-C  hydrOXYzine (ATARAX) 25 MG tablet TAKE 1 TABLET BY MOUTH AS NEEDED UP TO TWICE A DAY 04/21/22   Breeback, Jade L, PA-C  ipratropium-albuterol (DUONEB) 0.5-2.5 (3) MG/3ML SOLN Take 3 mLs by nebulization every 4 (four) hours as needed. 03/17/20   Breeback, Jade L, PA-C  levalbuterol (XOPENEX HFA) 45 MCG/ACT inhaler INHALE 1 TO 2 PUFFS BY MOUTH EVERY 6 HOURS AS NEEDED FOR WHEEZE 05/15/22   Breeback, Jade L, PA-C  meloxicam (MOBIC) 15 MG tablet TAKE 1 TABLET BY MOUTH EVERY DAY 05/22/22   Breeback, Jade L, PA-C  montelukast (SINGULAIR) 10 MG tablet TAKE 1 TABLET BY MOUTH EVERYDAY AT BEDTIME 06/23/22   Breeback, Jade L, PA-C  OLANZapine (ZYPREXA) 7.5 MG tablet TAKE 1 TABLET (7.5 MG TOTAL) BY MOUTH AT BEDTIME. NO REFILLS. OVERDUE FOR A VISIT W/PCP 06/26/22   Breeback, Jade L, PA-C  ondansetron (ZOFRAN) 8 MG tablet TAKE 1 TABLET BY MOUTH EVERY 8 HOURS AS NEEDED FOR NAUSEA AND VOMITING 05/15/22   Breeback, Jade L, PA-C  rOPINIRole (REQUIP) 4 MG tablet Take 1 tablet (4 mg total) by mouth at bedtime. 07/04/22   Breeback, Royetta Car, PA-C  tirzepatide [facility]) 10 MG/0.5ML Pen INJECT 10 MG INTO THE SKIN ONE TIME PER WEEK 06/02/22   Breeback, Jade L, PA-C  topiramate (TOPAMAX) 50 MG tablet TAKE 1 TABLET BY MOUTH TWICE A DAY 04/18/22   Breeback, Jade L, PA-C  traZODone (DESYREL) 50 MG tablet TAKE 0.5-1 TABLETS BY MOUTH AT BEDTIME AS NEEDED FOR SLEEP. 02/17/22   Donella Stade, PA-C    Family History Family History  Problem  Relation Age of Onset   Depression Mother    Heart attack Father    Diabetes Father    Uterine cancer Other    Pancreatic cancer Other    Hypertension Other        parents    Social History Social History   Tobacco Use   Smoking status: Former    Types: Cigarettes    Quit date: 02/12/2014    Years since quitting: 8.4   Smokeless tobacco: Never  Substance Use Topics   Alcohol use: No    Alcohol/week: 0.0 standard drinks of alcohol   Drug use: No  Allergies   Molnupiravir and Sulfa antibiotics   Review of Systems Review of Systems See HPI  Physical Exam Triage Vital Signs ED Triage Vitals  Enc Vitals Group     BP 07/13/22 1055 (!) 109/56     Pulse Rate 07/13/22 1055 75     Resp 07/13/22 1055 17     Temp 07/13/22 1055 (!) 97.5 F (36.4 C)     Temp Source 07/13/22 1055 Oral     SpO2 07/13/22 1055 97 %     Weight --      Height --      Head Circumference --      Peak Flow --      Pain Score 07/13/22 1054 6     Pain Loc --      Pain Edu? --      Excl. in [place]? --    No data found.  Updated Vital Signs BP (!) 109/56 (BP Location: Right Arm)   Pulse 75   Temp (!) 97.5 F (36.4 C) (Oral)   Resp 17   SpO2 97%      Physical Exam Constitutional:      General: She is not in acute distress.    Appearance: She is well-developed. She is obese.  HENT:     Head: Normocephalic and atraumatic.  Eyes:     Conjunctiva/sclera: Conjunctivae normal.     Pupils: Pupils are equal, round, and reactive to light.  Cardiovascular:     Rate and Rhythm: Normal rate.  Pulmonary:     Effort: Pulmonary effort is normal. No respiratory distress.  Abdominal:     General: There is no distension.     Palpations: Abdomen is soft.  Musculoskeletal:        General: Swelling, tenderness and signs of injury present. Normal range of motion.     Cervical back: Normal range of motion.     Comments: Right upper extremity has bruising on the region of the distal humerus, lateral  arm.  There are abrasions healing on the forearm just below the elbow, dorsal surface.  There is tenderness of the distal radius and ulna near the scaphoid.  Good range of motion of all joints.  Skin:    General: Skin is warm and dry.  Neurological:     Mental Status: She is alert.      UC Treatments / Results  Labs (all labs ordered are listed, but only abnormal results are displayed) Labs Reviewed - No data to display  EKG   Radiology DG Forearm Left  Result Date: 07/13/2022 CLINICAL DATA:  Fall, left arm pain EXAM: LEFT FOREARM - 2 VIEW COMPARISON:  None Available. FINDINGS: Left radius and ulna are intact without fracture or dislocation. No elbow joint effusion. Severe osteoarthritis of the first carpometacarpal joint. Soft tissue swelling of the proximal forearm. IMPRESSION: 1. No acute fracture or dislocation of the left forearm. 2. Soft tissue swelling of the proximal forearm. Electronically Signed   By: Davina Poke D.O.   On: 07/13/2022 11:51   DG Humerus Left  Result Date: 07/13/2022 CLINICAL DATA:  Fall, left arm pain EXAM: LEFT HUMERUS - 2+ VIEW COMPARISON:  08/11/2016 FINDINGS: There is no evidence of fracture or other focal bone lesions. No shoulder dislocation. Mild-to-moderate degenerative changes of the left shoulder. Alignment at the elbow joint is maintained. Soft tissues are unremarkable. IMPRESSION: No acute fracture or dislocation of the left humerus. Electronically Signed   By: Davina Poke D.O.  On: 07/13/2022 11:50    Procedures Procedures (including critical care time)  Medications Ordered in UC Medications - No data to display  Initial Impression / Assessment and Plan / UC Course  I have reviewed the triage vital signs and the nursing notes.  Pertinent labs & imaging results that were available during my care of the patient were reviewed by me and considered in my medical decision making (see chart for details).    Last tetanus was in  2017 REVIEWED X RAYS Final Clinical Impressions(s) / UC Diagnoses   Final diagnoses:  Arm contusion, left, initial encounter  Fall in home, initial encounter     Discharge Instructions      Limit use of arm while it is painful See your doctor if not improving by next week     ED Prescriptions   None    PDMP not reviewed this encounter.   Raylene Everts, MD 07/13/22 1226

## 2022-07-13 NOTE — ED Triage Notes (Signed)
Pt c/o fall that occurred on Sunday which caused injury to LT arm and wrist. Some scabbing to forearm. Pain 6/10 Tylenol prn.

## 2022-07-13 NOTE — Discharge Instructions (Signed)
Limit use of arm while it is painful See your doctor if not improving by next week

## 2022-07-17 ENCOUNTER — Other Ambulatory Visit: Payer: Self-pay | Admitting: Physician Assistant

## 2022-07-17 DIAGNOSIS — E11621 Type 2 diabetes mellitus with foot ulcer: Secondary | ICD-10-CM | POA: Diagnosis not present

## 2022-07-17 DIAGNOSIS — B351 Tinea unguium: Secondary | ICD-10-CM | POA: Diagnosis not present

## 2022-07-17 DIAGNOSIS — G4700 Insomnia, unspecified: Secondary | ICD-10-CM

## 2022-07-17 DIAGNOSIS — E1159 Type 2 diabetes mellitus with other circulatory complications: Secondary | ICD-10-CM | POA: Diagnosis not present

## 2022-07-17 DIAGNOSIS — M2011 Hallux valgus (acquired), right foot: Secondary | ICD-10-CM | POA: Diagnosis not present

## 2022-07-17 DIAGNOSIS — L97512 Non-pressure chronic ulcer of other part of right foot with fat layer exposed: Secondary | ICD-10-CM | POA: Diagnosis not present

## 2022-07-18 ENCOUNTER — Encounter: Payer: Self-pay | Admitting: Physician Assistant

## 2022-07-18 DIAGNOSIS — R52 Pain, unspecified: Secondary | ICD-10-CM

## 2022-07-18 DIAGNOSIS — G894 Chronic pain syndrome: Secondary | ICD-10-CM

## 2022-07-19 MED ORDER — OXYCODONE HCL 15 MG PO TABS: 15.0000 mg | ORAL_TABLET | Freq: Three times a day (TID) | ORAL | 0 refills | Status: DC

## 2022-07-19 NOTE — Telephone Encounter (Signed)
..  PDMP reviewed during this encounter. No concerns.

## 2022-07-19 NOTE — Telephone Encounter (Signed)
Pt made an appointment for April 10th and is requesting refill on Oxycodone until then. Pharmacy on [facility]

## 2022-07-24 ENCOUNTER — Other Ambulatory Visit: Payer: Self-pay | Admitting: Physician Assistant

## 2022-07-26 DIAGNOSIS — E278 Other specified disorders of adrenal gland: Secondary | ICD-10-CM | POA: Diagnosis not present

## 2022-08-02 ENCOUNTER — Ambulatory Visit (INDEPENDENT_AMBULATORY_CARE_PROVIDER_SITE_OTHER): Payer: Medicare Other | Admitting: Physician Assistant

## 2022-08-02 ENCOUNTER — Encounter: Payer: Self-pay | Admitting: Physician Assistant

## 2022-08-02 VITALS — BP 125/80 | HR 64 | Ht 64.0 in | Wt 212.0 lb

## 2022-08-02 DIAGNOSIS — M170 Bilateral primary osteoarthritis of knee: Secondary | ICD-10-CM

## 2022-08-02 DIAGNOSIS — E1142 Type 2 diabetes mellitus with diabetic polyneuropathy: Secondary | ICD-10-CM

## 2022-08-02 DIAGNOSIS — M47816 Spondylosis without myelopathy or radiculopathy, lumbar region: Secondary | ICD-10-CM | POA: Diagnosis not present

## 2022-08-02 DIAGNOSIS — Z6841 Body Mass Index (BMI) 40.0 and over, adult: Secondary | ICD-10-CM

## 2022-08-02 DIAGNOSIS — G894 Chronic pain syndrome: Secondary | ICD-10-CM | POA: Diagnosis not present

## 2022-08-02 DIAGNOSIS — F5101 Primary insomnia: Secondary | ICD-10-CM | POA: Diagnosis not present

## 2022-08-02 DIAGNOSIS — J411 Mucopurulent chronic bronchitis: Secondary | ICD-10-CM

## 2022-08-02 DIAGNOSIS — R52 Pain, unspecified: Secondary | ICD-10-CM

## 2022-08-02 DIAGNOSIS — Z79899 Other long term (current) drug therapy: Secondary | ICD-10-CM | POA: Diagnosis not present

## 2022-08-02 LAB — POCT GLYCOSYLATED HEMOGLOBIN (HGB A1C): Hemoglobin A1C: 5.4 % (ref 4.0–5.6)

## 2022-08-02 MED ORDER — TRAZODONE HCL 100 MG PO TABS: 100.0000 mg | ORAL_TABLET | Freq: Every day | ORAL | 0 refills | Status: DC

## 2022-08-02 MED ORDER — TOPIRAMATE 100 MG PO TABS: 100.0000 mg | ORAL_TABLET | Freq: Two times a day (BID) | ORAL | 0 refills | Status: DC

## 2022-08-02 MED ORDER — IPRATROPIUM-ALBUTEROL 0.5-2.5 (3) MG/3ML IN SOLN: 3.0000 mL | RESPIRATORY_TRACT | 1 refills | Status: DC | PRN

## 2022-08-02 NOTE — Patient Instructions (Addendum)
Decrease buspar down to 10mg  for 3-5 days then stop. Let me know how anxiety is after.   Stop hydroxyzine. Increased trazodone to 100mg  at bedtime.   Increase Topamax 100mg  twice a day.

## 2022-08-02 NOTE — Progress Notes (Unsigned)
Established Patient Office Visit  Subjective   Patient ID: Sarah Wright, female    DOB: March 19, 1946  Age: 77 y.o. MRN: [id number]  Chief Complaint  Patient presents with   Follow-up    HPI Pt is a 77 yo obese female with chronic respiratory failure on 3L of O2, COPD, Chronic pain due to DDD, T2DM, MDD and GAD who presents to the clinic with daughter.   COPD-stable. No concerns. On O2 continuosly. Using [place] daily. Very rarely having to use duoneb.   Pt has been more sleepy throughout the day but her anxiety seems to be worse per patient. She will have a few panic feelings a few times a week. She does not feel like buspar helps her anxiety at all and maybe just makes her sleepy. No changes to her medications. She is having more migraines and headaches as well.   She is not checking her sugars. She is on mounjaro and doing well. No CP, palpitations. No open wounds.   She has been taking trazodone and hydroxyzine together for sleep at night. She wakes her groggy.    .. Active Ambulatory Problems    Diagnosis Date Noted   Anxiety and depression 03/16/2014   Dyslipidemia, goal LDL below 70 03/16/2014   COLD (chronic obstructive lung disease) 03/16/2014   Insomnia 03/16/2014   Esophageal reflux 03/16/2014   Bilateral hand pain 03/16/2014   DDD (degenerative disc disease), lumbar 03/16/2014   Lumbar spondylosis 03/17/2014   Aortic atherosclerosis ([facility]) 03/17/2014   Chronic venous insufficiency 03/27/2014   H/O colonoscopy 04/02/2014   Thyroid disease 04/02/2014   Cataract 04/02/2014   Type 2 diabetes mellitus 04/02/2014   Primary osteoarthritis of both knees 07/21/2014   Class 3 severe obesity due to excess calories with serious comorbidity and body mass index (BMI) of 40.0 to 44.9 in adult 10/07/2014   Dizziness 01/04/2015   Sensorineural hearing loss of both ears 01/26/2015   Thyroid nodule 06/22/2015   Memory loss 07/29/2015   Psoriasis 09/23/2015   Right shoulder  injury 01/27/2016   Balance problem 08/13/2016   Acute pain of left shoulder 08/13/2016   Numbness around mouth 09/08/2016   DDD (degenerative disc disease), cervical 12/29/2016   Microalbuminuria 01/30/2017   Chronic pain syndrome 05/06/2017   Skin abrasion 08/01/2017   Leukocytes in urine 08/01/2017   Dysuria 08/01/2017   Panic attack 08/01/2017   RLS (restless legs syndrome) 08/12/2018   COPD exacerbation 08/14/2019   Right ankle pain 01/20/2020   Severe episode of recurrent major depressive disorder, without psychotic features 08/20/2020   Excessive crying 08/20/2020   Orthostatic hypotension 03/22/2021   Bilateral impacted cerumen 06/14/2021   Callus of toe 07/15/2021   Pain management 07/26/2021   SOB (shortness of breath) on exertion 10/26/2021   Hypoxia 10/26/2021   Lung crackles 10/26/2021   Left adrenal mass 11/08/2021   Fatty liver disease, nonalcoholic 11/11/2021   Fall 12/19/2021   Bilateral primary osteoarthritis of knee 12/28/2021   Chronic respiratory failure with hypoxia 02/20/2022   GAD (generalized anxiety disorder) 02/20/2022   Resolved Ambulatory Problems    Diagnosis Date Noted   No Resolved Ambulatory Problems   Past Medical History:  Diagnosis Date   Diabetes mellitus without complication    Hyperlipidemia     ROS   See HPI.  Objective:     BP 125/80   Pulse 64   Ht 5\' 4"  (1.626 m)   Wt 212 lb (96.2 kg)   SpO2 99%  BMI 36.39 kg/m  BP Readings from Last 3 Encounters:  08/02/22 125/80  07/13/22 (!) 109/56  02/20/22 (!) 112/51   Wt Readings from Last 3 Encounters:  08/02/22 212 lb (96.2 kg)  02/20/22 224 lb (101.6 kg)  01/20/22 228 lb (103.4 kg)    .[name]    08/02/2022   11:18 AM 12/30/2021    1:54 PM 12/19/2021   11:09 AM 08/20/2020   11:00 AM 10/28/2019    1:17 PM  Depression screen PHQ 2/9  Decreased Interest 0 0 3 2 1   Down, Depressed, Hopeless 0 0 3 3 0  PHQ - 2 Score 0 0 6 5 1   Altered sleeping  0 3 0 1  Tired, decreased  energy  3 3 1 1   Change in appetite  0 0 0 1  Feeling bad or failure about yourself   2 3 1 1   Trouble concentrating  0 1 0 0  Moving slowly or fidgety/restless  0 2 0 0  Suicidal thoughts  0 2 0 0  PHQ-9 Score  5 20 7 5   Difficult doing work/chores  Not difficult at all Very difficult Not difficult at all Not difficult at all  .[name] Results for orders placed or performed in visit on 08/02/22  DRUG MONITORING, PANEL 6 WITH CONFIRMATION, URINE  Result Value Ref Range   Alcohol Metabolites NEGATIVE <500 ng/mL   Amphetamines NEGATIVE <500 ng/mL   Barbiturates NEGATIVE <300 ng/mL   Benzodiazepines NEGATIVE <100 ng/mL   Cocaine Metabolite NEGATIVE <150 ng/mL   6 Acetylmorphine NEGATIVE <10 ng/mL   Marijuana Metabolite NEGATIVE <20 ng/mL   Methadone Metabolite NEGATIVE <100 ng/mL   Opiates NEGATIVE CONFIRMED <100 ng/mL   Codeine NEGATIVE <50 ng/mL   Hydrocodone NEGATIVE <50 ng/mL   Hydromorphone NEGATIVE <50 ng/mL   Morphine NEGATIVE <50 ng/mL   Norhydrocodone NEGATIVE <50 ng/mL   Opiates Comments     Oxycodone POSITIVE (A) <100 ng/mL   Noroxycodone >10,000 (H) <50 ng/mL   Oxycodone 2,298 (H) <50 ng/mL   Oxymorphone 336 (H) <50 ng/mL   Oxycodone Comments     Phencyclidine NEGATIVE <25 ng/mL   Creatinine 171.1 > or = 20.0 mg/dL   pH 5.9 4.5 - 9.0   Oxidant NEGATIVE <200 mcg/mL  DM TEMPLATE  Result Value Ref Range   Notes and Comments    POCT HgB A1C  Result Value Ref Range   Hemoglobin A1C 5.4 4.0 - 5.6 %   HbA1c POC (<> result, manual entry)     HbA1c, POC (prediabetic range)     HbA1c, POC (controlled diabetic range)       Physical Exam Constitutional:      Appearance: Normal appearance. She is obese.  HENT:     Head: Normocephalic.  Cardiovascular:     Rate and Rhythm: Normal rate and regular rhythm.     Heart sounds: Murmur heard.  Pulmonary:     Effort: Pulmonary effort is normal.     Comments: On O2 Musculoskeletal:     Right lower leg: No edema.     Left  lower leg: No edema.  Neurological:     General: No focal deficit present.     Mental Status: She is alert and oriented to person, place, and time.  Psychiatric:     Comments: Flat affect        Assessment & Plan:  [name] KitchenMarland KitchenLuanne was seen today for follow-up.  Diagnoses and all orders for this visit:  Pain management -  DRUG MONITORING, PANEL 6 WITH CONFIRMATION, URINE -     DM TEMPLATE -     oxyCODONE (ROXICODONE) 15 MG immediate release tablet; Take 1 tablet (15 mg total) by mouth in the morning, at noon, and at bedtime.  Chronic pain syndrome -     DRUG MONITORING, PANEL 6 WITH CONFIRMATION, URINE -     DM TEMPLATE -     oxyCODONE (ROXICODONE) 15 MG immediate release tablet; Take 1 tablet (15 mg total) by mouth in the morning, at noon, and at bedtime.  Type 2 diabetes mellitus with diabetic polyneuropathy, without long-term current use of insulin -     POCT HgB A1C  Mucopurulent chronic bronchitis -     ipratropium-albuterol (DUONEB) 0.5-2.5 (3) MG/3ML SOLN; Take 3 mLs by nebulization every 4 (four) hours as needed.  Class 3 severe obesity due to excess calories with serious comorbidity and body mass index (BMI) of 40.0 to 44.9 in adult -     topiramate (TOPAMAX) 100 MG tablet; Take 1 tablet (100 mg total) by mouth 2 (two) times daily.  Primary insomnia -     traZODone (DESYREL) 100 MG tablet; Take 1 tablet (100 mg total) by mouth at bedtime.  Lumbar spondylosis  Bilateral primary osteoarthritis of knee   Pain contract signed .[name] KitchenPDMP reviewed during this encounter. UDS ordered Oxycodone sent for next month Continue on cymbalta for pain and mood.  Follow up in 3 months  A1C looks great Continue same DM medications BP looks great On statin Foot and eye exam UTD Vaccines UTD  COPD-sees pulmonology On BREZTRI On O2 3L Nebulizer if needed   Reviewed medications and made some adjustments since daughter reports a lot of sleepiness during the day.  Stop  hydroxyzine and increased trazodone to  at bedtime.   Decrease buspar down to  for 3-5 days and then stop. See if anxiety changes, see if a little less sleepy during the day Could consider increase in zyprexa.   Increased topamax  bid for headaches/migraines and perhaps could help some with weight loss.   Follow up in 3 months or sooner if needed       Tandy Gaw, PA-C

## 2022-08-04 LAB — DRUG MONITORING, PANEL 6 WITH CONFIRMATION, URINE
6 Acetylmorphine: NEGATIVE ng/mL (ref <10)
Alcohol Metabolites: NEGATIVE ng/mL (ref <500)
Amphetamines: NEGATIVE ng/mL (ref <500)
Barbiturates: NEGATIVE ng/mL (ref <300)
Benzodiazepines: NEGATIVE ng/mL (ref <100)
Cocaine Metabolite: NEGATIVE ng/mL (ref <150)
Codeine: NEGATIVE ng/mL (ref <50)
Creatinine: 171.1 mg/dL (ref >=20.0)
Hydrocodone: NEGATIVE ng/mL (ref <50)
Hydromorphone: NEGATIVE ng/mL (ref <50)
Marijuana Metabolite: NEGATIVE ng/mL (ref <20)
Methadone Metabolite: NEGATIVE ng/mL (ref <100)
Morphine: NEGATIVE ng/mL (ref <50)
Norhydrocodone: NEGATIVE ng/mL (ref <50)
Noroxycodone: >10000 ng/mL — ABNORMAL HIGH (ref <50)
Opiates: NEGATIVE ng/mL (ref <100)
Oxidant: NEGATIVE ug/mL (ref <200)
Oxycodone: 2298 ng/mL — ABNORMAL HIGH (ref <50)
Oxycodone: POSITIVE ng/mL — AB (ref <100)
Oxymorphone: 336 ng/mL — ABNORMAL HIGH (ref <50)
Phencyclidine: NEGATIVE ng/mL (ref <25)
pH: 5.9 (ref 4.5–9.0)

## 2022-08-04 LAB — DM TEMPLATE

## 2022-08-04 NOTE — Progress Notes (Signed)
No concerns. 

## 2022-08-07 ENCOUNTER — Encounter: Payer: Self-pay | Admitting: Physician Assistant

## 2022-08-07 ENCOUNTER — Encounter: Payer: Self-pay | Admitting: *Deleted

## 2022-08-07 MED ORDER — OXYCODONE HCL 15 MG PO TABS: 15.0000 mg | ORAL_TABLET | Freq: Three times a day (TID) | ORAL | 0 refills | Status: DC

## 2022-08-10 DIAGNOSIS — E538 Deficiency of other specified B group vitamins: Secondary | ICD-10-CM | POA: Diagnosis not present

## 2022-08-10 DIAGNOSIS — R2689 Other abnormalities of gait and mobility: Secondary | ICD-10-CM | POA: Diagnosis not present

## 2022-08-13 ENCOUNTER — Other Ambulatory Visit: Payer: Self-pay | Admitting: Physician Assistant

## 2022-08-14 DIAGNOSIS — E1159 Type 2 diabetes mellitus with other circulatory complications: Secondary | ICD-10-CM | POA: Diagnosis not present

## 2022-08-14 DIAGNOSIS — L84 Corns and callosities: Secondary | ICD-10-CM | POA: Diagnosis not present

## 2022-08-19 ENCOUNTER — Other Ambulatory Visit: Payer: Self-pay | Admitting: Physician Assistant

## 2022-08-19 DIAGNOSIS — M25512 Pain in left shoulder: Secondary | ICD-10-CM

## 2022-08-21 ENCOUNTER — Other Ambulatory Visit: Payer: Self-pay | Admitting: Physician Assistant

## 2022-08-27 ENCOUNTER — Encounter: Payer: Self-pay | Admitting: Physician Assistant

## 2022-08-28 ENCOUNTER — Other Ambulatory Visit: Payer: Self-pay | Admitting: Physician Assistant

## 2022-08-28 DIAGNOSIS — M25512 Pain in left shoulder: Secondary | ICD-10-CM

## 2022-08-29 MED ORDER — ONDANSETRON HCL 8 MG PO TABS: ORAL_TABLET | ORAL | 0 refills | Status: DC

## 2022-08-29 NOTE — Addendum Note (Signed)
Addended by: Janeece Riggers D on: 08/29/2022 03:26 PM   Modules accepted: Orders

## 2022-08-30 MED ORDER — OLANZAPINE 10 MG PO TABS: 10.0000 mg | ORAL_TABLET | Freq: Every day | ORAL | 0 refills | Status: DC

## 2022-09-05 ENCOUNTER — Other Ambulatory Visit: Payer: Self-pay | Admitting: Physician Assistant

## 2022-09-05 DIAGNOSIS — E7849 Other hyperlipidemia: Secondary | ICD-10-CM

## 2022-09-13 ENCOUNTER — Other Ambulatory Visit: Payer: Self-pay | Admitting: Physician Assistant

## 2022-09-14 DIAGNOSIS — E538 Deficiency of other specified B group vitamins: Secondary | ICD-10-CM | POA: Diagnosis not present

## 2022-09-25 DIAGNOSIS — E1159 Type 2 diabetes mellitus with other circulatory complications: Secondary | ICD-10-CM | POA: Diagnosis not present

## 2022-09-25 DIAGNOSIS — B351 Tinea unguium: Secondary | ICD-10-CM | POA: Diagnosis not present

## 2022-09-25 DIAGNOSIS — L84 Corns and callosities: Secondary | ICD-10-CM | POA: Diagnosis not present

## 2022-09-25 DIAGNOSIS — L603 Nail dystrophy: Secondary | ICD-10-CM | POA: Diagnosis not present

## 2022-09-27 ENCOUNTER — Ambulatory Visit: Payer: Medicare Other | Admitting: Physician Assistant

## 2022-09-27 ENCOUNTER — Encounter: Payer: Self-pay | Admitting: Physician Assistant

## 2022-09-27 DIAGNOSIS — G894 Chronic pain syndrome: Secondary | ICD-10-CM

## 2022-09-27 DIAGNOSIS — R52 Pain, unspecified: Secondary | ICD-10-CM

## 2022-09-27 MED ORDER — OXYCODONE HCL 15 MG PO TABS: 15.0000 mg | ORAL_TABLET | Freq: Three times a day (TID) | ORAL | 0 refills | Status: DC

## 2022-10-02 ENCOUNTER — Ambulatory Visit (INDEPENDENT_AMBULATORY_CARE_PROVIDER_SITE_OTHER): Payer: Medicare Other | Admitting: Physician Assistant

## 2022-10-02 ENCOUNTER — Encounter: Payer: Self-pay | Admitting: Physician Assistant

## 2022-10-02 VITALS — Ht 64.0 in | Wt 193.8 lb

## 2022-10-02 DIAGNOSIS — I951 Orthostatic hypotension: Secondary | ICD-10-CM | POA: Diagnosis not present

## 2022-10-02 DIAGNOSIS — R42 Dizziness and giddiness: Secondary | ICD-10-CM | POA: Diagnosis not present

## 2022-10-02 MED ORDER — MIDODRINE HCL 5 MG PO TABS: 5.0000 mg | ORAL_TABLET | Freq: Three times a day (TID) | ORAL | 0 refills | Status: DC

## 2022-10-02 NOTE — Progress Notes (Unsigned)
   Acute Office Visit  Subjective:     Patient ID: Sarah Wright, female    DOB: 09-20-1945, 77 y.o.   MRN: [id number]  No chief complaint on file.   HPI Patient is in today for ***  ROS      Objective:    There were no vitals taken for this visit. {Vitals History (Optional):23777}  Physical Exam  No results found for any visits on 10/02/22.      Assessment & Plan:   Problem List Items Addressed This Visit   None   No orders of the defined types were placed in this encounter.   No follow-ups on file.  Tandy Gaw, PA-C

## 2022-10-02 NOTE — Patient Instructions (Signed)
Orthostatic Hypotension Blood pressure is a measurement of how strongly, or weakly, your circulating blood is pressing against the walls of your arteries. Orthostatic hypotension is a drop in blood pressure that can happen when you change positions, such as when you go from lying down to standing. Arteries are blood vessels that carry blood from your heart throughout your body. When blood pressure is too low, you may not get enough blood to your brain or to the rest of your organs. Orthostatic hypotension can cause light-headedness, sweating, rapid heartbeat, blurred vision, and fainting. These symptoms require further investigation into the cause. What are the causes? Orthostatic hypotension can be caused by many things, including: Sudden changes in posture, such as standing up quickly after you have been sitting or lying down. Loss of blood (anemia) or loss of body fluids (dehydration). Heart problems, neurologic problems, or hormone problems. Pregnancy. Aging. The risk for this condition increases as you get older. Severe infection (sepsis). Certain medicines, such as medicines for high blood pressure or medicines that make the body lose excess fluids (diuretics). What are the signs or symptoms? Symptoms of this condition may include: Weakness, light-headedness, or dizziness. Sweating. Blurred vision. Tiredness (fatigue). Rapid heartbeat. Fainting, in severe cases. How is this diagnosed? This condition is diagnosed based on: Your symptoms and medical history. Your blood pressure measurements. Your health care provider will check your blood pressure when you are: Lying down. Sitting. Standing. A blood pressure reading is recorded as two numbers, such as "120 over 80" (or 120/80). The first ("top") number is called the systolic pressure. It is a measure of the pressure in your arteries as your heart beats. The second ("bottom") number is called the diastolic pressure. It is a measure of  the pressure in your arteries when your heart relaxes between beats. Blood pressure is measured in a unit called mmHg. Healthy blood pressure for most adults is 120/80 mmHg. Orthostatic hypotension is defined as a 20 mmHg drop in systolic pressure or a 10 mmHg drop in diastolic pressure within 3 minutes of standing. Other information or tests that may be used to diagnose orthostatic hypotension include: Your other vital signs, such as your heart rate and temperature. Blood tests. An electrocardiogram (ECG) or echocardiogram. A Holter monitor. This is a device you wear that records your heart rhythm continuously, usually for 24-48 hours. Tilt table test. For this test, you will be safely secured to a table that moves you from a lying position to an upright position. Your heart rhythm and blood pressure will be monitored during the test. How is this treated? This condition may be treated by: Changing your diet. This may involve eating more salt (sodium) or drinking more water. Changing the dosage of certain medicines you are taking that might be lowering your blood pressure. Correcting the underlying reason for the orthostatic hypotension. Wearing compression stockings. Taking medicines to raise your blood pressure. Avoiding actions that trigger symptoms. Follow these instructions at home: Medicines Take over-the-counter and prescription medicines only as told by your health care provider. Follow instructions from your health care provider about changing the dosage of your current medicines, if this applies. Do not stop or adjust any of your medicines on your own. Eating and drinking  Drink enough fluid to keep your urine pale yellow. Eat extra salt only as directed. Do not add extra salt to your diet unless advised by your health care provider. Eat frequent, small meals. Avoid standing up suddenly after eating. General instructions    Get up slowly from lying down or sitting positions. This  gives your blood pressure a chance to adjust. Avoid hot showers and excessive heat as directed by your health care provider. Engage in regular physical activity as directed by your health care provider. If you have compression stockings, wear them as told. Keep all follow-up visits. This is important. Contact a health care provider if: You have a fever for more than 2-3 days. You feel more thirsty than usual. You feel dizzy or weak. Get help right away if: You have chest pain. You have a fast or irregular heartbeat. You become sweaty or feel light-headed. You feel short of breath. You faint. You have any symptoms of a stroke. "BE FAST" is an easy way to remember the main warning signs of a stroke: B - Balance. Signs are dizziness, sudden trouble walking, or loss of balance. E - Eyes. Signs are trouble seeing or a sudden change in vision. F - Face. Signs are sudden weakness or numbness of the face, or the face or eyelid drooping on one side. A - Arms. Signs are weakness or numbness in an arm. This happens suddenly and usually on one side of the body. S - Speech. Signs are sudden trouble speaking, slurred speech, or trouble understanding what people say. T - Time. Time to call emergency services. Write down what time symptoms started. You have other signs of a stroke, such as: A sudden, severe headache with no known cause. Nausea or vomiting. Seizure. These symptoms may represent a serious problem that is an emergency. Do not wait to see if the symptoms will go away. Get medical help right away. Call your local emergency services (911 in the [place] Do not drive yourself to the hospital. Summary Orthostatic hypotension is a sudden drop in blood pressure. It can cause light-headedness, sweating, rapid heartbeat, blurred vision, and fainting. Orthostatic hypotension can be diagnosed by having your blood pressure taken while lying down, sitting, and then standing. Treatment may involve  changing your diet, wearing compression stockings, sitting up slowly, adjusting your medicines, or correcting the underlying reason for the orthostatic hypotension. Get help right away if you have chest pain, a fast or irregular heartbeat, or symptoms of a stroke. This information is not intended to replace advice given to you by your health care provider. Make sure you discuss any questions you have with your health care provider. Document Revised: 06/24/2020 Document Reviewed: 06/24/2020 Elsevier Patient Education  2024 [employer]  

## 2022-10-16 ENCOUNTER — Ambulatory Visit (INDEPENDENT_AMBULATORY_CARE_PROVIDER_SITE_OTHER): Payer: Medicare Other | Admitting: Physician Assistant

## 2022-10-16 ENCOUNTER — Encounter: Payer: Self-pay | Admitting: Physician Assistant

## 2022-10-16 VITALS — BP 102/45 | HR 97 | Ht 64.0 in | Wt 193.0 lb

## 2022-10-16 DIAGNOSIS — I951 Orthostatic hypotension: Secondary | ICD-10-CM | POA: Diagnosis not present

## 2022-10-16 DIAGNOSIS — F332 Major depressive disorder, recurrent severe without psychotic features: Secondary | ICD-10-CM | POA: Diagnosis not present

## 2022-10-16 DIAGNOSIS — L299 Pruritus, unspecified: Secondary | ICD-10-CM | POA: Insufficient documentation

## 2022-10-16 MED ORDER — OLANZAPINE 10 MG PO TABS: 10.0000 mg | ORAL_TABLET | Freq: Every day | ORAL | 0 refills | Status: DC

## 2022-10-16 MED ORDER — MIDODRINE HCL 10 MG PO TABS: 10.0000 mg | ORAL_TABLET | Freq: Three times a day (TID) | ORAL | 0 refills | Status: DC

## 2022-10-16 NOTE — Patient Instructions (Signed)
Referral to cardiology  Orthostatic Hypotension Blood pressure is a measurement of how strongly, or weakly, your circulating blood is pressing against the walls of your arteries. Orthostatic hypotension is a drop in blood pressure that can happen when you change positions, such as when you go from lying down to standing. Arteries are blood vessels that carry blood from your heart throughout your body. When blood pressure is too low, you may not get enough blood to your brain or to the rest of your organs. Orthostatic hypotension can cause light-headedness, sweating, rapid heartbeat, blurred vision, and fainting. These symptoms require further investigation into the cause. What are the causes? Orthostatic hypotension can be caused by many things, including: Sudden changes in posture, such as standing up quickly after you have been sitting or lying down. Loss of blood (anemia) or loss of body fluids (dehydration). Heart problems, neurologic problems, or hormone problems. Pregnancy. Aging. The risk for this condition increases as you get older. Severe infection (sepsis). Certain medicines, such as medicines for high blood pressure or medicines that make the body lose excess fluids (diuretics). What are the signs or symptoms? Symptoms of this condition may include: Weakness, light-headedness, or dizziness. Sweating. Blurred vision. Tiredness (fatigue). Rapid heartbeat. Fainting, in severe cases. How is this diagnosed? This condition is diagnosed based on: Your symptoms and medical history. Your blood pressure measurements. Your health care provider will check your blood pressure when you are: Lying down. Sitting. Standing. A blood pressure reading is recorded as two numbers, such as "120 over 80" (or 120/80). The first ("top") number is called the systolic pressure. It is a measure of the pressure in your arteries as your heart beats. The second ("bottom") number is called the diastolic  pressure. It is a measure of the pressure in your arteries when your heart relaxes between beats. Blood pressure is measured in a unit called mmHg. Healthy blood pressure for most adults is 120/80 mmHg. Orthostatic hypotension is defined as a 20 mmHg drop in systolic pressure or a 10 mmHg drop in diastolic pressure within 3 minutes of standing. Other information or tests that may be used to diagnose orthostatic hypotension include: Your other vital signs, such as your heart rate and temperature. Blood tests. An electrocardiogram (ECG) or echocardiogram. A Holter monitor. This is a device you wear that records your heart rhythm continuously, usually for 24-48 hours. Tilt table test. For this test, you will be safely secured to a table that moves you from a lying position to an upright position. Your heart rhythm and blood pressure will be monitored during the test. How is this treated? This condition may be treated by: Changing your diet. This may involve eating more salt (sodium) or drinking more water. Changing the dosage of certain medicines you are taking that might be lowering your blood pressure. Correcting the underlying reason for the orthostatic hypotension. Wearing compression stockings. Taking medicines to raise your blood pressure. Avoiding actions that trigger symptoms. Follow these instructions at home: Medicines Take over-the-counter and prescription medicines only as told by your health care provider. Follow instructions from your health care provider about changing the dosage of your current medicines, if this applies. Do not stop or adjust any of your medicines on your own. Eating and drinking  Drink enough fluid to keep your urine pale yellow. Eat extra salt only as directed. Do not add extra salt to your diet unless advised by your health care provider. Eat frequent, small meals. Avoid standing up suddenly after  eating. General instructions  Get up slowly from lying  down or sitting positions. This gives your blood pressure a chance to adjust. Avoid hot showers and excessive heat as directed by your health care provider. Engage in regular physical activity as directed by your health care provider. If you have compression stockings, wear them as told. Keep all follow-up visits. This is important. Contact a health care provider if: You have a fever for more than 2-3 days. You feel more thirsty than usual. You feel dizzy or weak. Get help right away if: You have chest pain. You have a fast or irregular heartbeat. You become sweaty or feel light-headed. You feel short of breath. You faint. You have any symptoms of a stroke. "BE FAST" is an easy way to remember the main warning signs of a stroke: B - Balance. Signs are dizziness, sudden trouble walking, or loss of balance. E - Eyes. Signs are trouble seeing or a sudden change in vision. F - Face. Signs are sudden weakness or numbness of the face, or the face or eyelid drooping on one side. A - Arms. Signs are weakness or numbness in an arm. This happens suddenly and usually on one side of the body. S - Speech. Signs are sudden trouble speaking, slurred speech, or trouble understanding what people say. T - Time. Time to call emergency services. Write down what time symptoms started. You have other signs of a stroke, such as: A sudden, severe headache with no known cause. Nausea or vomiting. Seizure. These symptoms may represent a serious problem that is an emergency. Do not wait to see if the symptoms will go away. Get medical help right away. Call your local emergency services (911 in the [place] Do not drive yourself to the hospital. Summary Orthostatic hypotension is a sudden drop in blood pressure. It can cause light-headedness, sweating, rapid heartbeat, blurred vision, and fainting. Orthostatic hypotension can be diagnosed by having your blood pressure taken while lying down, sitting, and then  standing. Treatment may involve changing your diet, wearing compression stockings, sitting up slowly, adjusting your medicines, or correcting the underlying reason for the orthostatic hypotension. Get help right away if you have chest pain, a fast or irregular heartbeat, or symptoms of a stroke. This information is not intended to replace advice given to you by your health care provider. Make sure you discuss any questions you have with your health care provider. Document Revised: 06/24/2020 Document Reviewed: 06/24/2020 Elsevier Patient Education  2024 [employer].

## 2022-10-16 NOTE — Progress Notes (Addendum)
Established Patient Office Visit  Subjective   Patient ID: Sarah Wright, female    DOB: [birth year]  Age: 77 y.o. MRN: [id number]  Chief Complaint  Patient presents with   Follow-up   Hypotension    HPI Pt is a 77 yo obese female who presents to the clinic with daughter to follow up on orthostatic hypotension. She started midodrine and is doing a little better but still having dizziness with standing. Not regularly wearing compression stockings. She is eating more saltly snacks. No recent falls. She did have left adrenal mass removed in march but was having symptoms before this but seemed to worsen after this.   Pharmacy refilled zyprexa 7.5mg  dose and not 10mg  dose.   Having some itching of left thumb base. Using benadryl. No rash.   .. Active Ambulatory Problems    Diagnosis Date Noted   Anxiety and depression 03/16/2014   Dyslipidemia, goal LDL below 70 03/16/2014   COLD (chronic obstructive lung disease) (HCC) 03/16/2014   Insomnia 03/16/2014   Esophageal reflux 03/16/2014   Bilateral hand pain 03/16/2014   DDD (degenerative disc disease), lumbar 03/16/2014   Lumbar spondylosis 03/17/2014   Aortic atherosclerosis (HCC) 03/17/2014   Chronic venous insufficiency 03/27/2014   H/O colonoscopy 04/02/2014   Thyroid disease 04/02/2014   Cataract 04/02/2014   Type 2 diabetes mellitus (HCC) 04/02/2014   Primary osteoarthritis of both knees 07/21/2014   Class 3 severe obesity due to excess calories with serious comorbidity and body mass index (BMI) of 40.0 to 44.9 in adult [facility]) 10/07/2014   Vertigo 01/04/2015   Sensorineural hearing loss of both ears 01/26/2015   Thyroid nodule 06/22/2015   Memory loss 07/29/2015   Psoriasis 09/23/2015   Right shoulder injury 01/27/2016   Balance problem 08/13/2016   Acute pain of left shoulder 08/13/2016   Numbness around mouth 09/08/2016   DDD (degenerative disc disease), cervical 12/29/2016   Microalbuminuria 01/30/2017   Chronic pain  syndrome 05/06/2017   Skin abrasion 08/01/2017   Leukocytes in urine 08/01/2017   Dysuria 08/01/2017   Panic attack 08/01/2017   RLS (restless legs syndrome) 08/12/2018   COPD exacerbation (HCC) 08/14/2019   Right ankle pain 01/20/2020   Severe episode of recurrent major depressive disorder, without psychotic features (HCC) 08/20/2020   Excessive crying 08/20/2020   Orthostatic hypotension 03/22/2021   Bilateral impacted cerumen 06/14/2021   Callus of toe 07/15/2021   Pain management 07/26/2021   SOB (shortness of breath) on exertion 10/26/2021   Hypoxia 10/26/2021   Lung crackles 10/26/2021   Left adrenal mass (HCC) 11/08/2021   Fatty liver disease, nonalcoholic 11/11/2021   Fall 12/19/2021   Bilateral primary osteoarthritis of knee 12/28/2021   Chronic respiratory failure with hypoxia (HCC) 02/20/2022   GAD (generalized anxiety disorder) 02/20/2022   Resolved Ambulatory Problems    Diagnosis Date Noted   No Resolved Ambulatory Problems   Past Medical History:  Diagnosis Date   Diabetes mellitus without complication (HCC)    Hyperlipidemia     ROS See HPI.    Objective:     BP (!) 102/45 (BP Location: Left Arm, Patient Position: Sitting, Cuff Size: Normal)   Pulse 97   Ht 5\' 4"  (1.626 m)   Wt 193 lb (87.5 kg)   SpO2 99%   BMI 33.13 kg/m  BP Readings from Last 3 Encounters:  10/16/22 (!) 102/45  08/02/22 125/80  07/13/22 (!) 109/56   Wt Readings from Last 3 Encounters:  10/16/22 193 lb (87.5  kg)  10/02/22 193 lb 12 oz (87.9 kg)  08/02/22 212 lb (96.2 kg)   .[name] KitchenOrthostatic VS for the past 24 hrs (Last 3 readings):  BP- Lying BP- Sitting BP- Standing at 0 minutes  10/16/22 1311 112/58 112/70 100/65    Physical Exam Constitutional:      Appearance: Normal appearance. She is obese.  HENT:     Head: Normocephalic.  Cardiovascular:     Rate and Rhythm: Normal rate.  Pulmonary:     Effort: Pulmonary effort is normal.     Breath sounds: Normal breath  sounds.  Musculoskeletal:     Right lower leg: No edema.     Left lower leg: No edema.  Neurological:     General: No focal deficit present.     Mental Status: She is alert and oriented to person, place, and time.     Motor: No weakness.     Gait: Gait normal.     Comments: Dizziness upon standing  Psychiatric:     Comments: Flat affect       Assessment & Plan:  [name] KitchenMarland KitchenLuanne was seen today for follow-up and hypotension.  Diagnoses and all orders for this visit:  Orthostatic hypotension -     midodrine (PROAMATINE) 10 MG tablet; Take 1 tablet (10 mg total) by mouth 3 (three) times daily. -     Ambulatory referral to Cardiology  Severe episode of recurrent major depressive disorder, without psychotic features (HCC) -     OLANZapine (ZYPREXA) 10 MG tablet; Take 1 tablet (10 mg total) by mouth at bedtime.   Systolic drop was better at than at last visit BP is still low Not wearing compression stockings regularly Continue to not restrict salt Drink plenty of fluids Increase midodrine to 10mg  TID Cardiology referral made to consider more testing and labs Zyprexa was not increased sent over 10mg  dose again.  Use hydroxyzine as needed and cool compress for itching ? If neuropathy presenting as itching   Follow up after cardiology appt or in 1 month.    Tandy Gaw, PA-C

## 2022-10-18 ENCOUNTER — Ambulatory Visit (INDEPENDENT_AMBULATORY_CARE_PROVIDER_SITE_OTHER): Payer: Medicare Other | Admitting: Cardiology

## 2022-10-18 ENCOUNTER — Encounter: Payer: Self-pay | Admitting: Cardiology

## 2022-10-18 VITALS — BP 107/70 | HR 81 | Ht 65.0 in

## 2022-10-18 DIAGNOSIS — I951 Orthostatic hypotension: Secondary | ICD-10-CM

## 2022-10-18 DIAGNOSIS — E785 Hyperlipidemia, unspecified: Secondary | ICD-10-CM

## 2022-10-18 NOTE — Patient Instructions (Signed)
   Follow-Up: At [facility], you and your health needs are our priority.  As part of our continuing mission to provide you with exceptional heart care, we have created designated Provider Care Teams.  These Care Teams include your primary Cardiologist (physician) and Advanced Practice Providers (APPs -  Physician Assistants and Nurse Practitioners) who all work together to provide you with the care you need, when you need it.  We recommend signing up for the patient portal called "MyChart".  Sign up information is provided on this After Visit Summary.  MyChart is used to connect with patients for Virtual Visits (Telemedicine).  Patients are able to view lab/test results, encounter notes, upcoming appointments, etc.  Non-urgent messages can be sent to your provider as well.   To learn more about what you can do with MyChart, go to [email or web address].    Your next appointment:   3 month(s)  Provider:   Brian Crenshaw MD    

## 2022-10-18 NOTE — Progress Notes (Signed)
Referring-Jade Breeback PA-C Reason for referral-orthostatic hypotension  HPI: 77 year old female for evaluation of orthostatic hypotension at request of Tandy Gaw PA-C.  Carotid Dopplers June 2018 showed 1 to 39% left stenosis and normal right.  Abdominal CT July 2023 showed indeterminate mass in the left adrenal gland measuring 4.1 cm.  Follow-up biopsy versus biochemical assays to exclude pheochromocytoma and referral to urology for possible resection recommended.  Echocardiogram February 2024 showed normal LV function, mild right ventricular enlargement, mild tricuspid regurgitation, mild left atrial enlargement and mild right atrial enlargement.  Patient had resection of mass in March.  Pathology by report was benign.  She has had difficulties with orthostatic hypotension and cardiology asked to evaluate.  Patient states that she occasionally feels dizziness in the sitting position but not the lying position.  However she has significant dizziness with standing.  She has not had syncope.  She has dyspnea on exertion but no orthopnea, PND, pedal edema, exertional chest pain.  Cardiology now asked to evaluate.  Current Outpatient Medications  Medication Sig Dispense Refill   aspirin 81 MG tablet Take 81 mg by mouth daily.     atorvastatin (LIPITOR) 20 MG tablet TAKE 1 TABLET BY MOUTH EVERY DAY 90 tablet 0   BREZTRI AEROSPHERE 160-9-4.8 MCG/ACT AERO INHALE 2 PUFFS INTO THE LUNGS IN THE MORNING AND AT BEDTIME. 10.7 g 4   buPROPion (WELLBUTRIN XL) 150 MG 24 hr tablet TAKE 2 TABLETS BY MOUTH DAILY 180 tablet 1   busPIRone (BUSPAR) 10 MG tablet TAKE 1 TABLET BY MOUTH THREE TIMES A DAY 270 tablet 1   donepezil (ARICEPT) 10 MG tablet TAKE 1 TABLET BY MOUTH EVERY DAY 90 tablet 3   DULoxetine (CYMBALTA) 60 MG capsule TAKE 1 CAPSULE BY MOUTH TWICE A DAY 180 capsule 1   fluticasone (FLONASE) 50 MCG/ACT nasal spray SPRAY 2 SPRAYS INTO EACH NOSTRIL EVERY DAY 48 mL 1   furosemide (LASIX) 20 MG tablet  Take daily as needed for lower extremity swelling. Needs appointment for another refill 90 tablet 0   ipratropium-albuterol (DUONEB) 0.5-2.5 (3) MG/3ML SOLN Take 3 mLs by nebulization every 4 (four) hours as needed. 360 mL 1   levalbuterol (XOPENEX HFA) 45 MCG/ACT inhaler INHALE 1 TO 2 PUFFS BY MOUTH EVERY 6 HOURS AS NEEDED FOR WHEEZE 15 each 5   meloxicam (MOBIC) 15 MG tablet TAKE 1 TABLET BY MOUTH EVERY DAY 90 tablet 0   midodrine (PROAMATINE) 10 MG tablet Take 1 tablet (10 mg total) by mouth 3 (three) times daily. 90 tablet 0   montelukast (SINGULAIR) 10 MG tablet TAKE 1 TABLET BY MOUTH EVERYDAY AT BEDTIME 90 tablet 4   OLANZapine (ZYPREXA) 10 MG tablet Take 1 tablet (10 mg total) by mouth at bedtime. 90 tablet 0   ondansetron (ZOFRAN) 8 MG tablet TAKE 1 TABLET BY MOUTH EVERY 8 HOURS AS NEEDED FOR NAUSEA AND VOMITING 20 tablet 0   oxyCODONE (ROXICODONE) 15 MG immediate release tablet Take 1 tablet (15 mg total) by mouth in the morning, at noon, and at bedtime. 90 tablet 0   rOPINIRole (REQUIP) 4 MG tablet Take 1 tablet (4 mg total) by mouth at bedtime. 90 tablet 1   tirzepatide (MOUNJARO) 10 MG/0.5ML Pen INJECT 10 MG INTO THE SKIN ONE TIME PER WEEK 2 mL 1   topiramate (TOPAMAX) 100 MG tablet Take 1 tablet (100 mg total) by mouth 2 (two) times daily. 180 tablet 0   traZODone (DESYREL) 100 MG tablet Take 1  tablet (100 mg total) by mouth at bedtime. 90 tablet 0   No current facility-administered medications for this visit.    Allergies  Allergen Reactions   Molnupiravir Nausea Only    headache   Sulfa Antibiotics     Patient had reaction as a child; does not know what type of reaction     Past Medical History:  Diagnosis Date   Diabetes mellitus without complication (HCC)    Hyperlipidemia    Orthostatic hypotension    Thyroid disease     Past Surgical History:  Procedure Laterality Date   ABDOMINAL HYSTERECTOMY     Adrenal mass resection     CARPAL TUNNEL RELEASE     both  wrists   CATARACT EXTRACTION, BILATERAL     CERVICAL FUSION     GALLBLADDER SURGERY     right foot surgery     THYROIDECTOMY, PARTIAL     right side removed    Social History   Socioeconomic History   Marital status: Divorced    Spouse name: Not on file   Number of children: 3   Years of education: 13.5   Highest education level: Associate degree: occupational, Scientist, product/process development, or vocational program  Occupational History   Occupation: Retired  Tobacco Use   Smoking status: Former    Types: Cigarettes    Quit date: 02/12/2014    Years since quitting: 8.6   Smokeless tobacco: Never  Substance and Sexual Activity   Alcohol use: No    Alcohol/week: 0.0 standard drinks of alcohol   Drug use: No   Sexual activity: Not on file  Other Topics Concern   Not on file  Social History Narrative   Lives with her daughter and grand daughter. She enjoys spending time with family.   Social Determinants of Health   Financial Resource Strain: Low Risk  (08/01/2022)   Overall Financial Resource Strain (CARDIA)    Difficulty of Paying Living Expenses: Not hard at all  Food Insecurity: No Food Insecurity (08/01/2022)   Hunger Vital Sign    Worried About Running Out of Food in the Last Year: Never true    Ran Out of Food in the Last Year: Never true  Transportation Needs: No Transportation Needs (08/01/2022)   PRAPARE - Administrator, Civil Service (Medical): No    Lack of Transportation (Non-Medical): No  Physical Activity: Inactive (08/01/2022)   Exercise Vital Sign    Days of Exercise per Week: 0 days    Minutes of Exercise per Session: 0 min  Stress: Stress Concern Present (08/01/2022)   [employer] of Occupational Health - Occupational Stress Questionnaire    Feeling of Stress : To some extent  Social Connections: Unknown (08/01/2022)   Social Connection and Isolation Panel [NHANES]    Frequency of Communication with Friends and Family: Twice a week    Frequency of Social  Gatherings with Friends and Family: Patient declined    Attends Religious Services: Never    Database administrator or Organizations: No    Attends Banker Meetings: Never    Marital Status: Divorced  Catering manager Violence: Not At Risk (12/30/2021)   Humiliation, Afraid, Rape, and Kick questionnaire    Fear of Current or Ex-Partner: No    Emotionally Abused: No    Physically Abused: No    Sexually Abused: No    Family History  Problem Relation Age of Onset   Depression Mother    Heart attack Father  Diabetes Father    Uterine cancer Other    Pancreatic cancer Other    Hypertension Other        parents    ROS: no fevers or chills, productive cough, hemoptysis, dysphasia, odynophagia, melena, hematochezia, dysuria, hematuria, rash, seizure activity, orthopnea, PND, pedal edema, claudication. Remaining systems are negative.  Physical Exam:   Blood pressure 107/70, pulse 81, height 5\' 5"  (1.651 m), SpO2 98 %.  General:  Well developed/well nourished in NAD Skin warm/dry Patient not depressed No peripheral clubbing Back-normal HEENT-normal/normal eyelids Neck supple/normal carotid upstroke bilaterally; no bruits; no JVD; no thyromegaly chest - CTA/ normal expansion CV - RRR/normal S1 and S2; no murmurs, rubs or gallops;  PMI nondisplaced Abdomen -NT/ND, no HSM, no mass, + bowel sounds, no bruit 2+ femoral pulses, no bruits Ext-no edema, chords, 2+ DP Neuro-grossly nonfocal  ECG -normal sinus rhythm at a rate of 90, left intrafascicular block, right bundle branch block.  Personally reviewed  A/P  1 orthostatic hypotension-Zyprexa can contribute to orthostasis as can trazodone.  Will ask her to discuss changing these medications with primary care.  We discussed the importance of increased fluid intake and sodium intake.  I asked her to wear her compression hose daily.  Will continue higher dose of midodrine which was increased recently.  Can consider other  medication such as Florinef in the future if necessary.  2 hyperlipidemia-continue statin.  Olga Millers, MD

## 2022-10-21 ENCOUNTER — Other Ambulatory Visit: Payer: Self-pay | Admitting: Physician Assistant

## 2022-10-21 DIAGNOSIS — F419 Anxiety disorder, unspecified: Secondary | ICD-10-CM

## 2022-10-21 DIAGNOSIS — F332 Major depressive disorder, recurrent severe without psychotic features: Secondary | ICD-10-CM

## 2022-10-23 ENCOUNTER — Other Ambulatory Visit: Payer: Self-pay | Admitting: Physician Assistant

## 2022-10-23 DIAGNOSIS — F5101 Primary insomnia: Secondary | ICD-10-CM

## 2022-10-24 ENCOUNTER — Encounter: Payer: Self-pay | Admitting: Physician Assistant

## 2022-10-24 DIAGNOSIS — M25512 Pain in left shoulder: Secondary | ICD-10-CM

## 2022-10-24 MED ORDER — ONDANSETRON HCL 8 MG PO TABS: ORAL_TABLET | ORAL | 0 refills | Status: DC

## 2022-10-25 ENCOUNTER — Other Ambulatory Visit: Payer: Self-pay | Admitting: Physician Assistant

## 2022-10-25 DIAGNOSIS — R52 Pain, unspecified: Secondary | ICD-10-CM

## 2022-10-25 DIAGNOSIS — G894 Chronic pain syndrome: Secondary | ICD-10-CM

## 2022-10-25 MED ORDER — OXYCODONE HCL 15 MG PO TABS: 15.0000 mg | ORAL_TABLET | Freq: Three times a day (TID) | ORAL | 0 refills | Status: DC

## 2022-10-25 MED ORDER — MELOXICAM 15 MG PO TABS: 15.0000 mg | ORAL_TABLET | Freq: Every day | ORAL | 0 refills | Status: DC

## 2022-10-27 MED ORDER — GABAPENTIN 100 MG PO CAPS: 100.0000 mg | ORAL_CAPSULE | Freq: Every day | ORAL | 1 refills | Status: DC

## 2022-10-27 NOTE — Addendum Note (Signed)
Addended by: Nani Gasser D on: 10/27/2022 08:29 AM   Modules accepted: Orders

## 2022-11-07 ENCOUNTER — Other Ambulatory Visit: Payer: Self-pay | Admitting: Physician Assistant

## 2022-11-07 DIAGNOSIS — F5101 Primary insomnia: Secondary | ICD-10-CM

## 2022-11-09 ENCOUNTER — Encounter: Payer: Self-pay | Admitting: Physician Assistant

## 2022-11-12 ENCOUNTER — Other Ambulatory Visit: Payer: Self-pay | Admitting: Physician Assistant

## 2022-11-12 DIAGNOSIS — I951 Orthostatic hypotension: Secondary | ICD-10-CM

## 2022-11-14 ENCOUNTER — Encounter: Payer: Self-pay | Admitting: Physician Assistant

## 2022-11-15 ENCOUNTER — Other Ambulatory Visit: Payer: Self-pay | Admitting: Physician Assistant

## 2022-11-15 MED ORDER — PRIMIDONE 50 MG PO TABS: 25.0000 mg | ORAL_TABLET | Freq: Every day | ORAL | 0 refills | Status: DC

## 2022-11-22 ENCOUNTER — Other Ambulatory Visit: Payer: Self-pay | Admitting: Physician Assistant

## 2022-11-22 DIAGNOSIS — E7849 Other hyperlipidemia: Secondary | ICD-10-CM

## 2022-11-23 ENCOUNTER — Encounter: Payer: Self-pay | Admitting: Physician Assistant

## 2022-11-23 DIAGNOSIS — G894 Chronic pain syndrome: Secondary | ICD-10-CM

## 2022-11-23 DIAGNOSIS — R52 Pain, unspecified: Secondary | ICD-10-CM

## 2022-11-23 NOTE — Telephone Encounter (Signed)
Requesting rx rf of oxycodone 15mg  Last written 10/25/2022 Last OV 10/16/2022 No upcoming appt schld

## 2022-11-24 MED ORDER — OXYCODONE HCL 15 MG PO TABS: 15.0000 mg | ORAL_TABLET | Freq: Three times a day (TID) | ORAL | 0 refills | Status: DC

## 2022-11-24 NOTE — Telephone Encounter (Signed)
..  PDMP reviewed during this encounter. Up to date appt On pain contract No concerns Oxycodone refilled

## 2022-12-05 ENCOUNTER — Encounter: Payer: Self-pay | Admitting: Physician Assistant

## 2022-12-05 DIAGNOSIS — E1142 Type 2 diabetes mellitus with diabetic polyneuropathy: Secondary | ICD-10-CM | POA: Diagnosis not present

## 2022-12-05 DIAGNOSIS — L84 Corns and callosities: Secondary | ICD-10-CM | POA: Diagnosis not present

## 2022-12-05 DIAGNOSIS — B351 Tinea unguium: Secondary | ICD-10-CM | POA: Diagnosis not present

## 2022-12-08 ENCOUNTER — Other Ambulatory Visit: Payer: Self-pay | Admitting: Physician Assistant

## 2022-12-09 ENCOUNTER — Encounter: Payer: Self-pay | Admitting: Physician Assistant

## 2022-12-09 DIAGNOSIS — G2581 Restless legs syndrome: Secondary | ICD-10-CM

## 2022-12-11 MED ORDER — ROPINIROLE HCL 4 MG PO TABS: 4.0000 mg | ORAL_TABLET | Freq: Every day | ORAL | 1 refills | Status: DC

## 2022-12-13 ENCOUNTER — Encounter: Payer: Self-pay | Admitting: Physician Assistant

## 2022-12-13 ENCOUNTER — Ambulatory Visit (INDEPENDENT_AMBULATORY_CARE_PROVIDER_SITE_OTHER): Payer: Medicare Other | Admitting: Physician Assistant

## 2022-12-13 VITALS — BP 104/70 | HR 60 | Ht 64.0 in | Wt 184.0 lb

## 2022-12-13 DIAGNOSIS — R1030 Lower abdominal pain, unspecified: Secondary | ICD-10-CM

## 2022-12-13 DIAGNOSIS — J3489 Other specified disorders of nose and nasal sinuses: Secondary | ICD-10-CM | POA: Diagnosis not present

## 2022-12-13 DIAGNOSIS — F5101 Primary insomnia: Secondary | ICD-10-CM

## 2022-12-13 DIAGNOSIS — D649 Anemia, unspecified: Secondary | ICD-10-CM | POA: Diagnosis not present

## 2022-12-13 DIAGNOSIS — R197 Diarrhea, unspecified: Secondary | ICD-10-CM

## 2022-12-13 DIAGNOSIS — R159 Full incontinence of feces: Secondary | ICD-10-CM

## 2022-12-13 DIAGNOSIS — R109 Unspecified abdominal pain: Secondary | ICD-10-CM | POA: Diagnosis not present

## 2022-12-13 DIAGNOSIS — K625 Hemorrhage of anus and rectum: Secondary | ICD-10-CM

## 2022-12-13 DIAGNOSIS — G2581 Restless legs syndrome: Secondary | ICD-10-CM | POA: Diagnosis not present

## 2022-12-13 DIAGNOSIS — F411 Generalized anxiety disorder: Secondary | ICD-10-CM | POA: Diagnosis not present

## 2022-12-13 DIAGNOSIS — E1142 Type 2 diabetes mellitus with diabetic polyneuropathy: Secondary | ICD-10-CM | POA: Diagnosis not present

## 2022-12-13 DIAGNOSIS — F332 Major depressive disorder, recurrent severe without psychotic features: Secondary | ICD-10-CM | POA: Diagnosis not present

## 2022-12-13 LAB — POCT GLYCOSYLATED HEMOGLOBIN (HGB A1C): Hemoglobin A1C: 4.8 % (ref 4.0–5.6)

## 2022-12-13 MED ORDER — PRIMIDONE 50 MG PO TABS: 25.0000 mg | ORAL_TABLET | Freq: Every day | ORAL | 3 refills | Status: DC

## 2022-12-13 NOTE — Progress Notes (Signed)
Established Patient Office Visit  Subjective   Patient ID: Sarah Wright, female    DOB: 10-22-1945  Age: 77 y.o. MRN: [id number]  Chief Complaint  Patient presents with   Medical Management of Chronic Issues    Ibs     HPI Pt is a 77 yo obese female with T2DM, COPD, HLD, MDD, Insomnia, GAD, Chronic pain due to DDD who presents to the clinic for follow up. She is accompanied by daughter.   Pt is not checking sugars. She is taking her medication. No hypoglycemic events. No open sores or wounds. Ongoing neuropathy.   Chronic pain ongoing and pain medications do help. She is in a wheelchair most of the day and cannot stand for long periods of time. She can still take a shower by herself.   Her breathing is stable. No changes in SOB.   Primadone and requip work to keep her RLS controlled.   Pt also is having diarrhea for months with blood in it. She has not mentioned it before. She feels like it is getting worse. She is having 6 or more loose stools a day. Stools have a really bad odor. She reports bright red blood in toilet. No pain with bowel movements. She denies any abdominal pain but is having lower abdominal cramping. She does not have any fecal control. Last colonoscopy 2018 and stated she did not need any more screenings at last visit. Her loss of bowel control has made her really upset. She wears a diaper.   Having sinus pressure and congestion for at least 2 weeks. No fever, chills, body aches. She is using nasal sprays and mucinex with some relief. Hx of sinus infections.   She admits to be depressed. She does not have any friends here. She does not do anything. She just stays in her room. She feels like a "problem".    .. Active Ambulatory Problems    Diagnosis Date Noted   Anxiety and depression 03/16/2014   Dyslipidemia, goal LDL below 70 03/16/2014   COLD (chronic obstructive lung disease) (HCC) 03/16/2014   Insomnia 03/16/2014   Esophageal reflux 03/16/2014    Bilateral hand pain 03/16/2014   DDD (degenerative disc disease), lumbar 03/16/2014   Lumbar spondylosis 03/17/2014   Aortic atherosclerosis (HCC) 03/17/2014   Chronic venous insufficiency 03/27/2014   H/O colonoscopy 04/02/2014   Thyroid disease 04/02/2014   Cataract 04/02/2014   Type 2 diabetes mellitus (HCC) 04/02/2014   Primary osteoarthritis of both knees 07/21/2014   Class 3 severe obesity due to excess calories with serious comorbidity and body mass index (BMI) of 40.0 to 44.9 in adult [facility]) 10/07/2014   Vertigo 01/04/2015   Sensorineural hearing loss of both ears 01/26/2015   Thyroid nodule 06/22/2015   Memory loss 07/29/2015   Psoriasis 09/23/2015   Right shoulder injury 01/27/2016   Balance problem 08/13/2016   Acute pain of left shoulder 08/13/2016   Numbness around mouth 09/08/2016   DDD (degenerative disc disease), cervical 12/29/2016   Microalbuminuria 01/30/2017   Chronic pain syndrome 05/06/2017   Skin abrasion 08/01/2017   Leukocytes in urine 08/01/2017   Dysuria 08/01/2017   Panic attack 08/01/2017   RLS (restless legs syndrome) 08/12/2018   COPD exacerbation (HCC) 08/14/2019   Right ankle pain 01/20/2020   Severe episode of recurrent major depressive disorder, without psychotic features (HCC) 08/20/2020   Excessive crying 08/20/2020   Orthostatic hypotension 03/22/2021   Bilateral impacted cerumen 06/14/2021   Callus of toe 07/15/2021  Pain management 07/26/2021   SOB (shortness of breath) on exertion 10/26/2021   Hypoxia 10/26/2021   Lung crackles 10/26/2021   Left adrenal mass (HCC) 11/08/2021   Fatty liver disease, nonalcoholic 11/11/2021   Fall 12/19/2021   Bilateral primary osteoarthritis of knee 12/28/2021   Chronic respiratory failure with hypoxia (HCC) 02/20/2022   GAD (generalized anxiety disorder) 02/20/2022   Itching 10/16/2022   Incontinence of feces 12/15/2022   Resolved Ambulatory Problems    Diagnosis Date Noted   No Resolved  Ambulatory Problems   Past Medical History:  Diagnosis Date   Diabetes mellitus without complication (HCC)    Hyperlipidemia      ROS   See HPI.  Objective:     BP 104/70   Pulse 60   Ht 5\' 4"  (1.626 m)   Wt 184 lb (83.5 kg)   SpO2 96%   BMI 31.58 kg/m  BP Readings from Last 3 Encounters:  12/13/22 104/70  10/18/22 107/70  10/16/22 (!) 102/45   Wt Readings from Last 3 Encounters:  12/13/22 184 lb (83.5 kg)  10/16/22 193 lb (87.5 kg)  10/02/22 193 lb 12 oz (87.9 kg)     .[name] Lab Results  Component Value Date   HGBA1C 4.8 12/13/2022    Physical Exam Constitutional:      Appearance: Normal appearance. She is obese.  HENT:     Head: Normocephalic.  Cardiovascular:     Rate and Rhythm: Normal rate and regular rhythm.     Heart sounds: Murmur heard.  Pulmonary:     Effort: Pulmonary effort is normal.     Breath sounds: Normal breath sounds.     Comments: On 2L of O2.  Abdominal:     General: There is no distension.     Palpations: Abdomen is soft. There is no mass.     Tenderness: There is no abdominal tenderness. There is no right CVA tenderness, left CVA tenderness, guarding or rebound.     Hernia: No hernia is present.  Musculoskeletal:     Right lower leg: No edema.     Left lower leg: No edema.  Skin:    Coloration: Skin is pale.  Neurological:     Mental Status: She is alert and oriented to person, place, and time.  Psychiatric:     Comments: Very tearful         Assessment & Plan:  [name] KitchenMarland KitchenLuanne was seen today for medical management of chronic issues.  Diagnoses and all orders for this visit:  Type 2 diabetes mellitus with diabetic polyneuropathy, without long-term current use of insulin (HCC) -     POCT HgB A1C  Diarrhea, unspecified type -     CMP14+EGFR -     CBC w/Diff/Platelet -     Cdiff NAA+O+P+Stool Culture -     CT ABDOMEN PELVIS W CONTRAST; Future -     Ambulatory referral to Gastroenterology  Rectal bleeding -     CT ABDOMEN  PELVIS W CONTRAST; Future -     Ambulatory referral to Gastroenterology  Anemia, unspecified type -     CT ABDOMEN PELVIS W CONTRAST; Future -     Ambulatory referral to Gastroenterology  Abdominal cramping -     CT ABDOMEN PELVIS W CONTRAST; Future -     Ambulatory referral to Gastroenterology  Lower abdominal pain -     CT ABDOMEN PELVIS W CONTRAST; Future -     Ambulatory referral to Gastroenterology  Severe episode of recurrent  major depressive disorder, without psychotic features (HCC) -     Ambulatory referral to Psychiatry  GAD (generalized anxiety disorder)  Primary insomnia -     Ambulatory referral to Psychiatry  RLS (restless legs syndrome) -     primidone (MYSOLINE) 50 MG tablet; Take 0.5 tablets (25 mg total) by mouth at bedtime.  Sinus pressure  Incontinence of feces, unspecified fecal incontinence type   A1C to goal.  Continue same medications BP to goal On statin Needs eye exam Declines vaccines today  Refilled primadone with requip for RLS.   I do think some depression involved Referred out due to complexity of medications  Concerned with frequency and blood in urine Last colonoscopy 2018 Referral to [facility] Will get stool culture/c.diff  She does have some sinus pressure and congestion but will hold on any abx until c.diff testin Continue symptomatic care  Return in about 3 months (around 03/15/2023) for Needs to schedule Medicare Wellness Exam, Follow up.    Tandy Gaw, PA-C

## 2022-12-13 NOTE — Patient Instructions (Signed)
Will get labs Will order CT of abdomen and make GI referral Will refer to [facility]

## 2022-12-14 LAB — CBC WITH DIFFERENTIAL/PLATELET
Basophils Absolute: 0 10*3/uL (ref 0.0–0.2)
Basos: 1 %
EOS (ABSOLUTE): 0 10*3/uL (ref 0.0–0.4)
Eos: 1 %
Hematocrit: 32.8 % — ABNORMAL LOW (ref 34.0–46.6)
Hemoglobin: 10.2 g/dL — ABNORMAL LOW (ref 11.1–15.9)
Immature Grans (Abs): 0 10*3/uL (ref 0.0–0.1)
Immature Granulocytes: 0 %
Lymphocytes Absolute: 0.6 10*3/uL — ABNORMAL LOW (ref 0.7–3.1)
Lymphs: 14 %
MCH: 26 pg — ABNORMAL LOW (ref 26.6–33.0)
MCHC: 31.1 g/dL — ABNORMAL LOW (ref 31.5–35.7)
MCV: 84 fL (ref 79–97)
Monocytes Absolute: 0.5 10*3/uL (ref 0.1–0.9)
Monocytes: 11 %
Neutrophils Absolute: 3.1 10*3/uL (ref 1.4–7.0)
Neutrophils: 73 %
Platelets: 158 10*3/uL (ref 150–450)
RBC: 3.92 x10E6/uL (ref 3.77–5.28)
RDW: 14.3 % (ref 11.7–15.4)
WBC: 4.3 10*3/uL (ref 3.4–10.8)

## 2022-12-14 LAB — CMP14+EGFR
ALT: 14 IU/L (ref 0–32)
AST: 20 IU/L (ref 0–40)
Albumin: 3.8 g/dL (ref 3.8–4.8)
Alkaline Phosphatase: 132 IU/L — ABNORMAL HIGH (ref 44–121)
BUN/Creatinine Ratio: 15 (ref 12–28)
BUN: 13 mg/dL (ref 8–27)
Bilirubin Total: 0.2 mg/dL (ref 0.0–1.2)
CO2: 18 mmol/L — ABNORMAL LOW (ref 20–29)
Calcium: 8.6 mg/dL — ABNORMAL LOW (ref 8.7–10.3)
Chloride: 108 mmol/L — ABNORMAL HIGH (ref 96–106)
Creatinine, Ser: 0.85 mg/dL (ref 0.57–1.00)
Globulin, Total: 2.3 g/dL (ref 1.5–4.5)
Glucose: 82 mg/dL (ref 70–99)
Potassium: 3.9 mmol/L (ref 3.5–5.2)
Sodium: 140 mmol/L (ref 134–144)
Total Protein: 6.1 g/dL (ref 6.0–8.5)
eGFR: 70 mL/min/{1.73_m2} (ref >59)

## 2022-12-14 NOTE — Progress Notes (Signed)
Kidney function improved a lot. This is great.  You are anemic. Could be from blood loss in the colon. GI will evaluate this. Can you tolerate oral iron?

## 2022-12-15 ENCOUNTER — Encounter: Payer: Self-pay | Admitting: Physician Assistant

## 2022-12-15 DIAGNOSIS — R159 Full incontinence of feces: Secondary | ICD-10-CM | POA: Insufficient documentation

## 2022-12-20 ENCOUNTER — Encounter: Payer: Self-pay | Admitting: Physician Assistant

## 2022-12-20 DIAGNOSIS — R52 Pain, unspecified: Secondary | ICD-10-CM

## 2022-12-20 DIAGNOSIS — G894 Chronic pain syndrome: Secondary | ICD-10-CM

## 2022-12-20 MED ORDER — OXYCODONE HCL 15 MG PO TABS
15.0000 mg | ORAL_TABLET | Freq: Three times a day (TID) | ORAL | 0 refills | Status: DC
Start: 2022-12-20 — End: 2023-02-01

## 2022-12-20 NOTE — Progress Notes (Deleted)
HPI: Fu orthostatic hypotension.  Carotid Dopplers June 2018 showed 1 to 39% left stenosis and normal right.  Abdominal CT July 2023 showed indeterminate mass in the left adrenal gland measuring 4.1 cm.  Follow-up biopsy versus biochemical assays to exclude pheochromocytoma and referral to urology for possible resection recommended.  Echocardiogram February 2024 showed normal LV function, mild right ventricular enlargement, mild tricuspid regurgitation, mild left atrial enlargement and mild right atrial enlargement.  Patient had resection of mass in March.  Pathology by report was benign.  She has had difficulties with orthostatic hypotension.  Since last seen  Current Outpatient Medications  Medication Sig Dispense Refill   aspirin 81 MG tablet Take 81 mg by mouth daily.     atorvastatin (LIPITOR) 20 MG tablet TAKE 1 TABLET BY MOUTH EVERY DAY 90 tablet 0   BREZTRI AEROSPHERE 160-9-4.8 MCG/ACT AERO INHALE 2 PUFFS INTO THE LUNGS IN THE MORNING AND AT BEDTIME. 10.7 g 4   buPROPion (WELLBUTRIN XL) 150 MG 24 hr tablet TAKE 2 TABLETS BY MOUTH EVERY DAY 180 tablet 1   busPIRone (BUSPAR) 10 MG tablet TAKE 1 TABLET BY MOUTH THREE TIMES A DAY 270 tablet 1   donepezil (ARICEPT) 10 MG tablet TAKE 1 TABLET BY MOUTH EVERY DAY 90 tablet 3   DULoxetine (CYMBALTA) 60 MG capsule TAKE 1 CAPSULE BY MOUTH TWICE A DAY 180 capsule 1   fluticasone (FLONASE) 50 MCG/ACT nasal spray SPRAY 2 SPRAYS INTO EACH NOSTRIL EVERY DAY 48 mL 1   furosemide (LASIX) 20 MG tablet Take daily as needed for lower extremity swelling. Needs appointment for another refill 90 tablet 0   ipratropium-albuterol (DUONEB) 0.5-2.5 (3) MG/3ML SOLN Take 3 mLs by nebulization every 4 (four) hours as needed. 360 mL 1   levalbuterol (XOPENEX HFA) 45 MCG/ACT inhaler INHALE 1 TO 2 PUFFS BY MOUTH EVERY 6 HOURS AS NEEDED FOR WHEEZE 15 each 5   meloxicam (MOBIC) 15 MG tablet Take 1 tablet (15 mg total) by mouth daily. 90 tablet 0   midodrine (PROAMATINE)  10 MG tablet TAKE 1 TABLET BY MOUTH THREE TIMES A DAY 270 tablet 1   montelukast (SINGULAIR) 10 MG tablet TAKE 1 TABLET BY MOUTH EVERYDAY AT BEDTIME 90 tablet 4   OLANZapine (ZYPREXA) 10 MG tablet Take 1 tablet (10 mg total) by mouth at bedtime. 90 tablet 0   ondansetron (ZOFRAN) 8 MG tablet TAKE 1 TABLET BY MOUTH EVERY 8 HOURS AS NEEDED FOR NAUSEA AND VOMITING 20 tablet 0   oxyCODONE (ROXICODONE) 15 MG immediate release tablet Take 1 tablet (15 mg total) by mouth in the morning, at noon, and at bedtime. 90 tablet 0   primidone (MYSOLINE) 50 MG tablet Take 0.5 tablets (25 mg total) by mouth at bedtime. 90 tablet 3   rOPINIRole (REQUIP) 4 MG tablet Take 1 tablet (4 mg total) by mouth at bedtime. 90 tablet 1   tirzepatide (MOUNJARO) 10 MG/0.5ML Pen INJECT 10 MG INTO THE SKIN ONE TIME PER WEEK 2 mL 1   topiramate (TOPAMAX) 100 MG tablet TAKE 1 TABLET BY MOUTH TWICE A DAY 180 tablet 1   traZODone (DESYREL) 100 MG tablet TAKE 1 TABLET BY MOUTH EVERYDAY AT BEDTIME 90 tablet 1   No current facility-administered medications for this visit.     Past Medical History:  Diagnosis Date   Diabetes mellitus without complication (HCC)    Hyperlipidemia    Orthostatic hypotension    Thyroid disease     Past Surgical  History:  Procedure Laterality Date   ABDOMINAL HYSTERECTOMY     Adrenal mass resection     CARPAL TUNNEL RELEASE     both wrists   CATARACT EXTRACTION, BILATERAL     CERVICAL FUSION     GALLBLADDER SURGERY     right foot surgery     THYROIDECTOMY, PARTIAL     right side removed    Social History   Socioeconomic History   Marital status: Divorced    Spouse name: Not on file   Number of children: 3   Years of education: 13.5   Highest education level: Associate degree: occupational, Scientist, product/process development, or vocational program  Occupational History   Occupation: Retired  Tobacco Use   Smoking status: Former    Current packs/day: 0.00    Types: Cigarettes    Quit date: 02/12/2014     Years since quitting: 8.8   Smokeless tobacco: Never  Substance and Sexual Activity   Alcohol use: No    Alcohol/week: 0.0 standard drinks of alcohol   Drug use: No   Sexual activity: Not on file  Other Topics Concern   Not on file  Social History Narrative   Lives with her daughter and grand daughter. She enjoys spending time with family.   Social Determinants of Health   Financial Resource Strain: Low Risk  (08/01/2022)   Overall Financial Resource Strain (CARDIA)    Difficulty of Paying Living Expenses: Not hard at all  Food Insecurity: No Food Insecurity (08/01/2022)   Hunger Vital Sign    Worried About Running Out of Food in the Last Year: Never true    Ran Out of Food in the Last Year: Never true  Transportation Needs: No Transportation Needs (08/01/2022)   PRAPARE - Administrator, Civil Service (Medical): No    Lack of Transportation (Non-Medical): No  Physical Activity: Inactive (08/01/2022)   Exercise Vital Sign    Days of Exercise per Week: 0 days    Minutes of Exercise per Session: 0 min  Stress: Stress Concern Present (08/01/2022)   [employer] of Occupational Health - Occupational Stress Questionnaire    Feeling of Stress : To some extent  Social Connections: Unknown (08/01/2022)   Social Connection and Isolation Panel [NHANES]    Frequency of Communication with Friends and Family: Twice a week    Frequency of Social Gatherings with Friends and Family: Patient declined    Attends Religious Services: Never    Database administrator or Organizations: No    Attends Banker Meetings: Never    Marital Status: Divorced  Catering manager Violence: Not At Risk (12/30/2021)   Humiliation, Afraid, Rape, and Kick questionnaire    Fear of Current or Ex-Partner: No    Emotionally Abused: No    Physically Abused: No    Sexually Abused: No    Family History  Problem Relation Age of Onset   Depression Mother    Heart attack Father    Diabetes Father     Uterine cancer Other    Pancreatic cancer Other    Hypertension Other        parents    ROS: no fevers or chills, productive cough, hemoptysis, dysphasia, odynophagia, melena, hematochezia, dysuria, hematuria, rash, seizure activity, orthopnea, PND, pedal edema, claudication. Remaining systems are negative.  Physical Exam: Well-developed well-nourished in no acute distress.  Skin is warm and dry.  HEENT is normal.  Neck is supple.  Chest is clear to auscultation with  normal expansion.  Cardiovascular exam is regular rate and rhythm.  Abdominal exam nontender or distended. No masses palpated. Extremities show no edema. neuro grossly intact  ECG- personally reviewed  A/P  1 orthostatic hypotension-symptoms have improved.  Will continue midodrine.  We also discussed the importance of continued increased sodium and fluid intake.  2 hyperlipidemia-continue statin.  Olga Millers, MD

## 2022-12-20 NOTE — Telephone Encounter (Signed)
..  PDMP reviewed during this encounter. Sent percocet.

## 2022-12-21 ENCOUNTER — Other Ambulatory Visit: Payer: Self-pay | Admitting: Physician Assistant

## 2022-12-21 DIAGNOSIS — R413 Other amnesia: Secondary | ICD-10-CM

## 2022-12-21 DIAGNOSIS — R197 Diarrhea, unspecified: Secondary | ICD-10-CM | POA: Diagnosis not present

## 2022-12-23 ENCOUNTER — Other Ambulatory Visit: Payer: Self-pay | Admitting: Physician Assistant

## 2022-12-23 DIAGNOSIS — F332 Major depressive disorder, recurrent severe without psychotic features: Secondary | ICD-10-CM

## 2022-12-23 DIAGNOSIS — Z23 Encounter for immunization: Secondary | ICD-10-CM | POA: Diagnosis not present

## 2022-12-25 ENCOUNTER — Other Ambulatory Visit: Payer: Self-pay | Admitting: Physician Assistant

## 2022-12-25 ENCOUNTER — Encounter: Payer: Self-pay | Admitting: Physician Assistant

## 2022-12-25 DIAGNOSIS — G2581 Restless legs syndrome: Secondary | ICD-10-CM

## 2022-12-25 MED ORDER — RIFAXIMIN 550 MG PO TABS: 550.0000 mg | ORAL_TABLET | Freq: Three times a day (TID) | ORAL | 0 refills | Status: DC

## 2022-12-25 NOTE — Progress Notes (Signed)
Negative for C.diff. GREAT news.  Sent xifaxin for bowel reset.

## 2022-12-26 ENCOUNTER — Other Ambulatory Visit: Payer: Self-pay | Admitting: Physician Assistant

## 2022-12-26 DIAGNOSIS — Z780 Asymptomatic menopausal state: Secondary | ICD-10-CM

## 2022-12-26 DIAGNOSIS — Z0000 Encounter for general adult medical examination without abnormal findings: Secondary | ICD-10-CM

## 2022-12-26 MED ORDER — PRIMIDONE 50 MG PO TABS
50.0000 mg | ORAL_TABLET | Freq: Every day | ORAL | 1 refills | Status: DC
Start: 2022-12-26 — End: 2023-06-15

## 2022-12-27 LAB — CDIFF NAA+O+P+STOOL CULTURE
E coli, Shiga toxin Assay: NEGATIVE
Toxigenic C. Difficile by PCR: NEGATIVE

## 2022-12-28 ENCOUNTER — Encounter: Payer: Self-pay | Admitting: Physician Assistant

## 2022-12-28 DIAGNOSIS — F332 Major depressive disorder, recurrent severe without psychotic features: Secondary | ICD-10-CM

## 2022-12-28 MED ORDER — OLANZAPINE 10 MG PO TABS: 10.0000 mg | ORAL_TABLET | Freq: Every day | ORAL | 0 refills | Status: DC

## 2023-01-01 ENCOUNTER — Ambulatory Visit: Payer: Medicare Other | Admitting: Cardiology

## 2023-01-01 ENCOUNTER — Ambulatory Visit (INDEPENDENT_AMBULATORY_CARE_PROVIDER_SITE_OTHER): Payer: Medicare Other

## 2023-01-01 ENCOUNTER — Other Ambulatory Visit: Payer: Medicare Other

## 2023-01-01 DIAGNOSIS — R1030 Lower abdominal pain, unspecified: Secondary | ICD-10-CM | POA: Diagnosis not present

## 2023-01-01 DIAGNOSIS — K5730 Diverticulosis of large intestine without perforation or abscess without bleeding: Secondary | ICD-10-CM | POA: Diagnosis not present

## 2023-01-01 DIAGNOSIS — K625 Hemorrhage of anus and rectum: Secondary | ICD-10-CM | POA: Diagnosis not present

## 2023-01-01 DIAGNOSIS — R197 Diarrhea, unspecified: Secondary | ICD-10-CM | POA: Diagnosis not present

## 2023-01-01 DIAGNOSIS — D649 Anemia, unspecified: Secondary | ICD-10-CM | POA: Diagnosis not present

## 2023-01-01 DIAGNOSIS — R1032 Left lower quadrant pain: Secondary | ICD-10-CM | POA: Diagnosis not present

## 2023-01-01 DIAGNOSIS — R109 Unspecified abdominal pain: Secondary | ICD-10-CM

## 2023-01-01 MED ORDER — IOHEXOL 300 MG/ML  SOLN
100.0000 mL | Freq: Once | INTRAMUSCULAR | Status: AC | PRN
  Administered 2023-01-01: 100 mL via INTRAVENOUS

## 2023-01-02 ENCOUNTER — Ambulatory Visit (INDEPENDENT_AMBULATORY_CARE_PROVIDER_SITE_OTHER): Payer: Medicare Other | Admitting: Physician Assistant

## 2023-01-02 DIAGNOSIS — Z780 Asymptomatic menopausal state: Secondary | ICD-10-CM | POA: Diagnosis not present

## 2023-01-02 DIAGNOSIS — Z87891 Personal history of nicotine dependence: Secondary | ICD-10-CM

## 2023-01-02 DIAGNOSIS — Z0000 Encounter for general adult medical examination without abnormal findings: Secondary | ICD-10-CM

## 2023-01-02 NOTE — Patient Instructions (Signed)
MEDICARE ANNUAL WELLNESS VISIT Health Maintenance Summary and Written Plan of Care  [name] ,  Thank you for allowing me to perform your Medicare Annual Wellness Visit and for your ongoing commitment to your health.   Health Maintenance & Immunization History Health Maintenance  Topic Date Due   OPHTHALMOLOGY EXAM  01/02/2023 (Originally 07/26/2022)   COVID-19 Vaccine (4 - 2023-24 season) 01/18/2023 (Originally 12/24/2022)   Lung Cancer Screening  01/23/2023 (Originally 11/08/2022)   Diabetic kidney evaluation - Urine ACR  02/21/2023   HEMOGLOBIN A1C  06/15/2023   FOOT EXAM  08/02/2023   Diabetic kidney evaluation - eGFR measurement  12/13/2023   Medicare Annual Wellness (AWV)  01/02/2024   DTaP/Tdap/Td (2 - Td or Tdap) 10/03/2025   Pneumonia Vaccine 44+ Years old  Completed   INFLUENZA VACCINE  Completed   DEXA SCAN  Completed   Hepatitis C Screening  Completed   Zoster Vaccines- Shingrix  Completed   HPV VACCINES  Aged Out   Colonoscopy  Discontinued   Immunization History  Administered Date(s) Administered   Fluad Quad(high Dose 65+) 03/02/2021, 02/20/2022   Fluad Trivalent(High Dose 65+) 01/31/2022   Influenza, High Dose Seasonal PF 12/13/2016, 02/08/2018, 12/17/2018, 12/23/2022   Influenza-Unspecified 12/21/2014, 01/09/2016, 12/13/2016, 02/08/2018, 01/21/2020   Moderna Sars-Covid-2 Vaccination 07/09/2020   PFIZER(Purple Top)SARS-COV-2 Vaccination 05/20/2019, 06/17/2019   PPD Test 09/23/2014   Pneumococcal Conjugate-13 01/12/2014   Pneumococcal Polysaccharide-23 04/29/2003, 03/28/2016   Tdap 10/04/2015   Zoster Recombinant(Shingrix) 11/21/2016, 04/26/2022   Zoster, Live 05/22/2012    These are the patient goals that we discussed:  Goals Addressed               This Visit's Progress     Patient Stated (pt-stated)        Patient stated that she would like to maintain her current lifestyle.         This is a list of Health Maintenance Items that are overdue  or due now: Eye exam - patient scheduled.  Dexa scan Lung cancer screening  Orders/Referrals Placed Today: Orders Placed This Encounter  Procedures   DEXAScan    Standing Status:   Future    Standing Expiration Date:   01/02/2024    Scheduling Instructions:     Please call patient to schedule.    Order Specific Question:   Reason for exam:    Answer:   post menopausal    Order Specific Question:   Preferred imaging location?    Answer:   [facility]   Ambulatory Referral for Lung Cancer Scre    Referral Priority:   Routine    Referral Type:   Consultation    Referral Reason:   Specialty Services Required    Number of Visits Requested:   1   (Contact our referral department at [phone] if you have not spoken with someone about your referral appointment within the next 5 days)    Follow-up Plan Follow-up with Tandy Gaw L, PA-C as planned Schedule lung cancer screening and bone density scan.  Please have your eye exam records faxed to our office. Medicare wellness visit in one year.  Patient will access AVS on my chart.      Health Maintenance, Female Adopting a healthy lifestyle and getting preventive care are important in promoting health and wellness. Ask your health care provider about: The right schedule for you to have regular tests and exams. Things you can do on your own to prevent diseases and keep yourself healthy.  What should I know about diet, weight, and exercise? Eat a healthy diet  Eat a diet that includes plenty of vegetables, fruits, low-fat dairy products, and lean protein. Do not eat a lot of foods that are high in solid fats, added sugars, or sodium. Maintain a healthy weight Body mass index (BMI) is used to identify weight problems. It estimates body fat based on height and weight. Your health care provider can help determine your BMI and help you achieve or maintain a healthy weight. Get regular exercise Get regular exercise. This  is one of the most important things you can do for your health. Most adults should: Exercise for at least 150 minutes each week. The exercise should increase your heart rate and make you sweat (moderate-intensity exercise). Do strengthening exercises at least twice a week. This is in addition to the moderate-intensity exercise. Spend less time sitting. Even light physical activity can be beneficial. Watch cholesterol and blood lipids Have your blood tested for lipids and cholesterol at 79 years of age, then have this test every 5 years. Have your cholesterol levels checked more often if: Your lipid or cholesterol levels are high. You are older than 79 years of age. You are at high risk for heart disease. What should I know about cancer screening? Depending on your health history and family history, you may need to have cancer screening at various ages. This may include screening for: Breast cancer. Cervical cancer. Colorectal cancer. Skin cancer. Lung cancer. What should I know about heart disease, diabetes, and high blood pressure? Blood pressure and heart disease High blood pressure causes heart disease and increases the risk of stroke. This is more likely to develop in people who have high blood pressure readings or are overweight. Have your blood pressure checked: Every 3-5 years if you are 35-30 years of age. Every year if you are 107 years old or older. Diabetes Have regular diabetes screenings. This checks your fasting blood sugar level. Have the screening done: Once every three years after age 67 if you are at a normal weight and have a low risk for diabetes. More often and at a younger age if you are overweight or have a high risk for diabetes. What should I know about preventing infection? Hepatitis B If you have a higher risk for hepatitis B, you should be screened for this virus. Talk with your health care provider to find out if you are at risk for hepatitis B  infection. Hepatitis C Testing is recommended for: Everyone born from [birth year] through 1967. Anyone with known risk factors for hepatitis C. Sexually transmitted infections (STIs) Get screened for STIs, including gonorrhea and chlamydia, if: You are sexually active and are younger than 79 years of age. You are older than 79 years of age and your health care provider tells you that you are at risk for this type of infection. Your sexual activity has changed since you were last screened, and you are at increased risk for chlamydia or gonorrhea. Ask your health care provider if you are at risk. Ask your health care provider about whether you are at high risk for HIV. Your health care provider may recommend a prescription medicine to help prevent HIV infection. If you choose to take medicine to prevent HIV, you should first get tested for HIV. You should then be tested every 3 months for as long as you are taking the medicine. Pregnancy If you are about to stop having your period (premenopausal) and you may  become pregnant, seek counseling before you get pregnant. Take 400 to 800 micrograms (mcg) of folic acid every day if you become pregnant. Ask for birth control (contraception) if you want to prevent pregnancy. Osteoporosis and menopause Osteoporosis is a disease in which the bones lose minerals and strength with aging. This can result in bone fractures. If you are 61 years old or older, or if you are at risk for osteoporosis and fractures, ask your health care provider if you should: Be screened for bone loss. Take a calcium or vitamin D supplement to lower your risk of fractures. Be given hormone replacement therapy (HRT) to treat symptoms of menopause. Follow these instructions at home: Alcohol use Do not drink alcohol if: Your health care provider tells you not to drink. You are pregnant, may be pregnant, or are planning to become pregnant. If you drink alcohol: Limit how much you have  to: 0-1 drink a day. Know how much alcohol is in your drink. In the [place] one drink equals one 12 oz bottle of beer (355 mL), one 5 oz glass of wine (148 mL), or one 1 oz glass of hard liquor (44 mL). Lifestyle Do not use any products that contain nicotine or tobacco. These products include cigarettes, chewing tobacco, and vaping devices, such as e-cigarettes. If you need help quitting, ask your health care provider. Do not use street drugs. Do not share needles. Ask your health care provider for help if you need support or information about quitting drugs. General instructions Schedule regular health, dental, and eye exams. Stay current with your vaccines. Tell your health care provider if: You often feel depressed. You have ever been abused or do not feel safe at home. Summary Adopting a healthy lifestyle and getting preventive care are important in promoting health and wellness. Follow your health care provider's instructions about healthy diet, exercising, and getting tested or screened for diseases. Follow your health care provider's instructions on monitoring your cholesterol and blood pressure. This information is not intended to replace advice given to you by your health care provider. Make sure you discuss any questions you have with your health care provider. Document Revised: 08/30/2020 Document Reviewed: 08/30/2020 Elsevier Patient Education  2024 [employer].

## 2023-01-02 NOTE — Progress Notes (Signed)
MEDICARE ANNUAL WELLNESS VISIT  01/02/2023  Telephone Visit Disclaimer This Medicare AWV was conducted by telephone due to national recommendations for restrictions regarding the COVID-19 Pandemic (e.g. social distancing).  I verified, using two identifiers, that I am speaking with Sarah Wright or their authorized healthcare agent. I discussed the limitations, risks, security, and privacy concerns of performing an evaluation and management service by telephone and the potential availability of an in-person appointment in the future. The patient expressed understanding and agreed to proceed.  Location of Patient: Home Location of Provider (nurse):  In the office.  Subjective:    Sarah Wright is a 77 y.o. female patient of Sarah Wright, Sarah Cobb, PA-C who had a [employer] Visit today via telephone. Perlie is Retired and lives with their daughter. she has 3 children. she reports that she is socially active and does interact with friends/family regularly. she is minimally physically active and enjoys spending time with family.  Patient Care Team: Nolene Ebbs as PCP - General (Family Medicine)     01/02/2023    1:25 PM 12/30/2021    1:58 PM 03/16/2014    1:56 PM  Advanced Directives  Does Patient Have a Medical Advance Directive? Yes No No  Type of Advance Directive Living will;Healthcare Power of Attorney    Does patient want to make changes to medical advance directive? No - Patient declined    Copy of Healthcare Power of Attorney in Chart? No - copy requested    Would patient like information on creating a medical advance directive?  No - Patient declined No - patient declined information    Hospital Utilization Over the Past 12 Months: # of hospitalizations or ER visits: 1 # of surgeries: 1  Review of Systems    Patient reports that her overall health is unchanged compared to last year.  History obtained from chart review and the patient  Patient Reported  Readings (BP, Pulse, CBG, Weight, etc) none Per patient no change in vitals since last visit, unable to obtain new vitals due to telehealth visit  Pain Assessment Pain : No/denies pain     Current Medications & Allergies (verified) Allergies as of 01/02/2023       Reactions   Molnupiravir Nausea Only   headache   Gabapentin    Sedated and groggy.    Sulfa Antibiotics    Patient had reaction as a child; does not know what type of reaction        Medication List        Accurate as of January 02, 2023  1:41 PM. If you have any questions, ask your nurse or doctor.          STOP taking these medications    busPIRone 10 MG tablet Commonly known as: BUSPAR       TAKE these medications    aspirin 81 MG tablet Take 81 mg by mouth daily.   atorvastatin 20 MG tablet Commonly known as: LIPITOR TAKE 1 TABLET BY MOUTH EVERY DAY   Breztri Aerosphere 160-9-4.8 MCG/ACT Aero Generic drug: Budeson-Glycopyrrol-Formoterol INHALE 2 PUFFS INTO THE LUNGS IN THE MORNING AND AT BEDTIME.   buPROPion 150 MG 24 hr tablet Commonly known as: WELLBUTRIN XL TAKE 2 TABLETS BY MOUTH EVERY DAY   donepezil 10 MG tablet Commonly known as: ARICEPT TAKE 1 TABLET BY MOUTH EVERY DAY   DULoxetine 60 MG capsule Commonly known as: CYMBALTA TAKE 1 CAPSULE BY MOUTH TWICE A DAY   fluticasone  50 MCG/ACT nasal spray Commonly known as: FLONASE SPRAY 2 SPRAYS INTO EACH NOSTRIL EVERY DAY   furosemide 20 MG tablet Commonly known as: LASIX Take daily as needed for lower extremity swelling. Needs appointment for another refill   ipratropium-albuterol 0.5-2.5 (3) MG/3ML Soln Commonly known as: DUONEB Take 3 mLs by nebulization every 4 (four) hours as needed.   levalbuterol 45 MCG/ACT inhaler Commonly known as: XOPENEX HFA INHALE 1 TO 2 PUFFS BY MOUTH EVERY 6 HOURS AS NEEDED FOR WHEEZE   meloxicam 15 MG tablet Commonly known as: MOBIC Take 1 tablet (15 mg total) by mouth daily.    midodrine 10 MG tablet Commonly known as: PROAMATINE TAKE 1 TABLET BY MOUTH THREE TIMES A DAY   montelukast 10 MG tablet Commonly known as: SINGULAIR TAKE 1 TABLET BY MOUTH EVERYDAY AT BEDTIME   Mounjaro 10 MG/0.5ML Pen Generic drug: tirzepatide INJECT 10 MG INTO THE SKIN ONE TIME PER WEEK   OLANZapine 10 MG tablet Commonly known as: ZyPREXA Take 1 tablet (10 mg total) by mouth at bedtime.   ondansetron 8 MG tablet Commonly known as: ZOFRAN TAKE 1 TABLET BY MOUTH EVERY 8 HOURS AS NEEDED FOR NAUSEA AND VOMITING   oxyCODONE 15 MG immediate release tablet Commonly known as: ROXICODONE Take 1 tablet (15 mg total) by mouth in the morning, at noon, and at bedtime.   primidone 50 MG tablet Commonly known as: MYSOLINE Take 1 tablet (50 mg total) by mouth at bedtime.   rifaximin 550 MG Tabs tablet Commonly known as: Xifaxan Take 1 tablet (550 mg total) by mouth 3 (three) times daily. For 14 days.   rOPINIRole 4 MG tablet Commonly known as: REQUIP Take 1 tablet (4 mg total) by mouth at bedtime.   topiramate 100 MG tablet Commonly known as: TOPAMAX TAKE 1 TABLET BY MOUTH TWICE A DAY   traZODone 100 MG tablet Commonly known as: DESYREL TAKE 1 TABLET BY MOUTH EVERYDAY AT BEDTIME        History (reviewed): Past Medical History:  Diagnosis Date   Diabetes mellitus without complication (HCC)    Hyperlipidemia    Orthostatic hypotension    Thyroid disease    Past Surgical History:  Procedure Laterality Date   ABDOMINAL HYSTERECTOMY     Adrenal mass resection     CARPAL TUNNEL RELEASE     both wrists   CATARACT EXTRACTION, BILATERAL     CERVICAL FUSION     GALLBLADDER SURGERY     right foot surgery     THYROIDECTOMY, PARTIAL     right side removed   Family History  Problem Relation Age of Onset   Depression Mother    Heart attack Father    Diabetes Father    Uterine cancer Other    Pancreatic cancer Other    Hypertension Other        parents   Social  History   Socioeconomic History   Marital status: Divorced    Spouse name: Not on file   Number of children: 3   Years of education: 13.5   Highest education level: Associate degree: occupational, Scientist, product/process development, or vocational program  Occupational History   Occupation: Retired  Tobacco Use   Smoking status: Former    Current packs/day: 0.00    Types: Cigarettes    Quit date: 02/12/2014    Years since quitting: 8.8   Smokeless tobacco: Never  Vaping Use   Vaping status: Never Used  Substance and Sexual Activity   Alcohol  use: No    Alcohol/week: 0.0 standard drinks of alcohol   Drug use: No   Sexual activity: Not on file  Other Topics Concern   Not on file  Social History Narrative   Lives with her daughter and grand daughter. She enjoys spending time with family.   Social Determinants of Health   Financial Resource Strain: Low Risk  (01/02/2023)   Overall Financial Resource Strain (CARDIA)    Difficulty of Paying Living Expenses: Not hard at all  Food Insecurity: No Food Insecurity (01/02/2023)   Hunger Vital Sign    Worried About Running Out of Food in the Last Year: Never true    Ran Out of Food in the Last Year: Never true  Transportation Needs: No Transportation Needs (01/02/2023)   PRAPARE - Administrator, Civil Service (Medical): No    Lack of Transportation (Non-Medical): No  Physical Activity: Inactive (01/02/2023)   Exercise Vital Sign    Days of Exercise per Week: 0 days    Minutes of Exercise per Session: 0 min  Stress: No Stress Concern Present (01/02/2023)   [employer] of Occupational Health - Occupational Stress Questionnaire    Feeling of Stress : Only a little  Social Connections: Socially Isolated (01/02/2023)   Social Connection and Isolation Panel [NHANES]    Frequency of Communication with Friends and Family: More than three times a week    Frequency of Social Gatherings with Friends and Family: More than three times a week    Attends  Religious Services: Never    Database administrator or Organizations: No    Attends Banker Meetings: Never    Marital Status: Divorced    Activities of Daily Living    01/02/2023    1:29 PM  In your present state of health, do you have any difficulty performing the following activities:  Hearing? 0  Vision? 1  Comment "things are not clear"  Difficulty concentrating or making decisions? 0  Walking or climbing stairs? 1  Comment uses a walker  Dressing or bathing? 0  Doing errands, shopping? 0  Preparing Food and eating ? Y  Comment preparing food is difficult  Using the Toilet? N  In the past six months, have you accidently leaked urine? Y  Do you have problems with loss of bowel control? Y  Managing your Medications? N  Managing your Finances? N  Housekeeping or managing your Housekeeping? N    Patient Education/ Literacy How often do you need to have someone help you when you read instructions, pamphlets, or other written materials from your doctor or pharmacy?: 1 - Never What is the last grade level you completed in school?: 12th grade  Exercise    Diet Patient reports consuming 3 meals a day and 2 snack(s) a day Patient reports that her primary diet is: Regular Patient reports that she does have regular access to food.   Depression Screen    01/02/2023    1:26 PM 10/16/2022   11:15 AM 10/02/2022    3:59 PM 08/02/2022   11:18 AM 12/30/2021    1:54 PM 12/19/2021   11:09 AM 08/20/2020   11:00 AM  PHQ 2/9 Scores  PHQ - 2 Score 0 0 6 0 0 6 5  PHQ- 9 Score     5 20 7      Fall Risk    01/02/2023    1:26 PM 10/16/2022   11:15 AM 10/02/2022    3:59 PM  08/02/2022   11:18 AM 12/30/2021    1:54 PM  Fall Risk   Falls in the past year? 1 1 1  0 1  Number falls in past yr: 1 1 1  0 1  Injury with Fall? 1 1 1  0 1  Risk for fall due to : History of fall(s);Impaired mobility History of fall(s);Impaired balance/gait History of fall(s)  History of fall(s)  Follow up  Falls evaluation completed;Education provided;Falls prevention discussed Falls evaluation completed Falls evaluation completed  Falls evaluation completed;Education provided;Falls prevention discussed     Objective:  Sarah Wright seemed alert and oriented and she participated appropriately during our telephone visit.  Blood Pressure Weight BMI  BP Readings from Last 3 Encounters:  12/13/22 104/70  10/18/22 107/70  10/16/22 (!) 102/45   Wt Readings from Last 3 Encounters:  12/13/22 184 lb (83.5 kg)  10/16/22 193 lb (87.5 kg)  10/02/22 193 lb 12 oz (87.9 kg)   BMI Readings from Last 1 Encounters:  12/13/22 31.58 kg/m    *Unable to obtain current vital signs, weight, and BMI due to telephone visit type  Hearing/Vision  [place] did not seem to have difficulty with hearing/understanding during the telephone conversation Reports that she has had a formal eye exam by an eye care professional within the past year Reports that she has not had a formal hearing evaluation within the past year *Unable to fully assess hearing and vision during telephone visit type  Cognitive Function:    01/02/2023    1:33 PM 12/30/2021    2:03 PM  6CIT Screen  What Year? 0 points 0 points  What month? 0 points 0 points  What time? 0 points 0 points  Count back from 20 0 points 0 points  Months in reverse 0 points 0 points  Repeat phrase 0 points 0 points  Total Score 0 points 0 points   (Normal:0-7, Significant for Dysfunction: >8)  Normal Cognitive Function Screening: Yes   Immunization & Health Maintenance Record Immunization History  Administered Date(s) Administered   Fluad Quad(high Dose 65+) 03/02/2021, 02/20/2022   Fluad Trivalent(High Dose 65+) 01/31/2022   Influenza, High Dose Seasonal PF 12/13/2016, 02/08/2018, 12/17/2018, 12/23/2022   Influenza-Unspecified 12/21/2014, 01/09/2016, 12/13/2016, 02/08/2018, 01/21/2020   Moderna Sars-Covid-2 Vaccination 07/09/2020   PFIZER(Purple  Top)SARS-COV-2 Vaccination 05/20/2019, 06/17/2019   PPD Test 09/23/2014   Pneumococcal Conjugate-13 01/12/2014   Pneumococcal Polysaccharide-23 04/29/2003, 03/28/2016   Tdap 10/04/2015   Zoster Recombinant(Shingrix) 11/21/2016, 04/26/2022   Zoster, Live 05/22/2012    Health Maintenance  Topic Date Due   OPHTHALMOLOGY EXAM  01/02/2023 (Originally 07/26/2022)   COVID-19 Vaccine (4 - 2023-24 season) 01/18/2023 (Originally 12/24/2022)   Lung Cancer Screening  01/23/2023 (Originally 11/08/2022)   Diabetic kidney evaluation - Urine ACR  02/21/2023   HEMOGLOBIN A1C  06/15/2023   FOOT EXAM  08/02/2023   Diabetic kidney evaluation - eGFR measurement  12/13/2023   Medicare Annual Wellness (AWV)  01/02/2024   DTaP/Tdap/Td (2 - Td or Tdap) 10/03/2025   Pneumonia Vaccine 92+ Years old  Completed   INFLUENZA VACCINE  Completed   DEXA SCAN  Completed   Hepatitis C Screening  Completed   Zoster Vaccines- Shingrix  Completed   HPV VACCINES  Aged Out   Colonoscopy  Discontinued       Assessment  This is a routine wellness examination for [employer].  Health Maintenance: Due or Overdue There are no preventive care reminders to display for this patient.   Sarah Wright does not  need a referral for Community Assistance: Care Management:   no Social Work:    no Prescription Assistance:  no Nutrition/Diabetes Education:  no   Plan:  Personalized Goals  Goals Addressed               This Visit's Progress     Patient Stated (pt-stated)        Patient stated that she would like to maintain her current lifestyle.       Personalized Health Maintenance & Screening Recommendations  Eye exam - patient scheduled.  Dexa scan Lung cancer screening  Lung Cancer Screening Recommended: yes; due (Low Dose CT Chest recommended if Age 62-80 years, 20 pack-year currently smoking OR have quit w/in past 15 years) Hepatitis C Screening recommended: no HIV Screening recommended: no  Advanced  Directives: Written information was not prepared per patient's request.  Referrals & Orders Orders Placed This Encounter  Procedures   DEXAScan   Ambulatory Referral for Lung Cancer Scre    Follow-up Plan Follow-up with Jomarie Longs, PA-C as planned Schedule lung cancer screening and bone density scan.  Please have your eye exam records faxed to our office. Medicare wellness visit in one year.  Patient will access AVS on my chart.   I have personally reviewed and noted the following in the patient's chart:   Medical and social history Use of alcohol, tobacco or illicit drugs  Current medications and supplements Functional ability and status Nutritional status Physical activity Advanced directives List of other physicians Hospitalizations, surgeries, and ER visits in previous 12 months Vitals Screenings to include cognitive, depression, and falls Referrals and appointments  In addition, I have reviewed and discussed with Sarah Wright certain preventive protocols, quality metrics, and best practice recommendations. A written personalized care plan for preventive services as well as general preventive health recommendations is available and can be mailed to the patient at her request.      Modesto Charon. RN BSN  01/02/2023

## 2023-01-03 DIAGNOSIS — R159 Full incontinence of feces: Secondary | ICD-10-CM | POA: Diagnosis not present

## 2023-01-03 DIAGNOSIS — K5909 Other constipation: Secondary | ICD-10-CM | POA: Diagnosis not present

## 2023-01-03 DIAGNOSIS — K625 Hemorrhage of anus and rectum: Secondary | ICD-10-CM | POA: Diagnosis not present

## 2023-01-04 DIAGNOSIS — H43811 Vitreous degeneration, right eye: Secondary | ICD-10-CM | POA: Diagnosis not present

## 2023-01-04 DIAGNOSIS — H35372 Puckering of macula, left eye: Secondary | ICD-10-CM | POA: Diagnosis not present

## 2023-01-04 DIAGNOSIS — H353133 Nonexudative age-related macular degeneration, bilateral, advanced atrophic without subfoveal involvement: Secondary | ICD-10-CM | POA: Diagnosis not present

## 2023-01-04 DIAGNOSIS — Z961 Presence of intraocular lens: Secondary | ICD-10-CM | POA: Diagnosis not present

## 2023-01-04 DIAGNOSIS — H35033 Hypertensive retinopathy, bilateral: Secondary | ICD-10-CM | POA: Diagnosis not present

## 2023-01-05 ENCOUNTER — Telehealth: Payer: Self-pay | Admitting: Physician Assistant

## 2023-01-05 NOTE — Telephone Encounter (Signed)
Patient called requesting an update on her C scan that she had on Monday. Please advise.

## 2023-01-07 ENCOUNTER — Other Ambulatory Visit: Payer: Self-pay | Admitting: Physician Assistant

## 2023-01-07 DIAGNOSIS — E1142 Type 2 diabetes mellitus with diabetic polyneuropathy: Secondary | ICD-10-CM

## 2023-01-08 ENCOUNTER — Encounter: Payer: Self-pay | Admitting: Physician Assistant

## 2023-01-08 NOTE — Progress Notes (Signed)
No acute findings on CT. Shows diverticulosis without any inflammation or infection. Non obstructing renal stones seen. Evidence of some plaque accumulation in aorta. How is diarrhea?

## 2023-01-11 ENCOUNTER — Encounter: Payer: Self-pay | Admitting: Physician Assistant

## 2023-01-11 MED ORDER — MOUNJARO 10 MG/0.5ML ~~LOC~~ SOAJ: 10.0000 mg | SUBCUTANEOUS | 1 refills | Status: DC

## 2023-01-17 ENCOUNTER — Ambulatory Visit: Payer: Medicare Other

## 2023-01-17 DIAGNOSIS — Z0000 Encounter for general adult medical examination without abnormal findings: Secondary | ICD-10-CM | POA: Diagnosis not present

## 2023-01-17 DIAGNOSIS — Z780 Asymptomatic menopausal state: Secondary | ICD-10-CM | POA: Diagnosis not present

## 2023-01-17 NOTE — Progress Notes (Signed)
Bone density is NORMAL that is awesome! Continue vitamin D and calcium!

## 2023-01-18 DIAGNOSIS — H353113 Nonexudative age-related macular degeneration, right eye, advanced atrophic without subfoveal involvement: Secondary | ICD-10-CM | POA: Diagnosis not present

## 2023-01-24 DIAGNOSIS — F4323 Adjustment disorder with mixed anxiety and depressed mood: Secondary | ICD-10-CM | POA: Diagnosis not present

## 2023-01-29 DIAGNOSIS — R197 Diarrhea, unspecified: Secondary | ICD-10-CM | POA: Diagnosis not present

## 2023-01-29 DIAGNOSIS — M2042 Other hammer toe(s) (acquired), left foot: Secondary | ICD-10-CM | POA: Diagnosis not present

## 2023-01-29 DIAGNOSIS — M216X1 Other acquired deformities of right foot: Secondary | ICD-10-CM | POA: Diagnosis not present

## 2023-01-29 DIAGNOSIS — B351 Tinea unguium: Secondary | ICD-10-CM | POA: Diagnosis not present

## 2023-01-29 DIAGNOSIS — M79671 Pain in right foot: Secondary | ICD-10-CM | POA: Diagnosis not present

## 2023-01-29 DIAGNOSIS — M898X7 Other specified disorders of bone, ankle and foot: Secondary | ICD-10-CM | POA: Diagnosis not present

## 2023-01-29 DIAGNOSIS — L851 Acquired keratosis [keratoderma] palmaris et plantaris: Secondary | ICD-10-CM | POA: Diagnosis not present

## 2023-01-29 DIAGNOSIS — M2041 Other hammer toe(s) (acquired), right foot: Secondary | ICD-10-CM | POA: Diagnosis not present

## 2023-01-29 DIAGNOSIS — T8484XA Pain due to internal orthopedic prosthetic devices, implants and grafts, initial encounter: Secondary | ICD-10-CM | POA: Diagnosis not present

## 2023-01-29 DIAGNOSIS — I739 Peripheral vascular disease, unspecified: Secondary | ICD-10-CM | POA: Diagnosis not present

## 2023-01-31 ENCOUNTER — Encounter: Payer: Self-pay | Admitting: Physician Assistant

## 2023-01-31 DIAGNOSIS — R52 Pain, unspecified: Secondary | ICD-10-CM

## 2023-01-31 DIAGNOSIS — G894 Chronic pain syndrome: Secondary | ICD-10-CM

## 2023-02-01 ENCOUNTER — Encounter: Payer: Self-pay | Admitting: Physician Assistant

## 2023-02-01 DIAGNOSIS — F4323 Adjustment disorder with mixed anxiety and depressed mood: Secondary | ICD-10-CM | POA: Diagnosis not present

## 2023-02-01 MED ORDER — OXYCODONE HCL 15 MG PO TABS: 15.0000 mg | ORAL_TABLET | Freq: Three times a day (TID) | ORAL | 0 refills | Status: AC

## 2023-02-15 DIAGNOSIS — H35033 Hypertensive retinopathy, bilateral: Secondary | ICD-10-CM | POA: Diagnosis not present

## 2023-02-15 DIAGNOSIS — H35372 Puckering of macula, left eye: Secondary | ICD-10-CM | POA: Diagnosis not present

## 2023-02-15 DIAGNOSIS — Z961 Presence of intraocular lens: Secondary | ICD-10-CM | POA: Diagnosis not present

## 2023-02-15 DIAGNOSIS — H353123 Nonexudative age-related macular degeneration, left eye, advanced atrophic without subfoveal involvement: Secondary | ICD-10-CM | POA: Diagnosis not present

## 2023-02-15 DIAGNOSIS — H43811 Vitreous degeneration, right eye: Secondary | ICD-10-CM | POA: Diagnosis not present

## 2023-03-14 ENCOUNTER — Encounter: Payer: Self-pay | Admitting: Cardiology

## 2023-03-14 ENCOUNTER — Ambulatory Visit (INDEPENDENT_AMBULATORY_CARE_PROVIDER_SITE_OTHER): Payer: Medicare Other | Admitting: Cardiology

## 2023-03-14 VITALS — BP 120/63 | HR 61 | Ht 64.0 in | Wt 178.4 lb

## 2023-03-14 DIAGNOSIS — E785 Hyperlipidemia, unspecified: Secondary | ICD-10-CM | POA: Diagnosis not present

## 2023-03-14 DIAGNOSIS — I951 Orthostatic hypotension: Secondary | ICD-10-CM | POA: Diagnosis not present

## 2023-03-14 NOTE — Patient Instructions (Signed)
    Follow-Up: At [facility], you and your health needs are our priority.  As part of our continuing mission to provide you with exceptional heart care, we have created designated Provider Care Teams.  These Care Teams include your primary Cardiologist (physician) and Advanced Practice Providers (APPs -  Physician Assistants and Nurse Practitioners) who all work together to provide you with the care you need, when you need it.   Your next appointment:   12 month(s)  Provider:   Olga Millers, MD

## 2023-03-14 NOTE — Progress Notes (Signed)
HPI: FU orthostatic hypotension.  Carotid Dopplers June 2018 showed 1 to 39% left stenosis and normal right.  Abdominal CT July 2023 showed indeterminate mass in the left adrenal gland measuring 4.1 cm.  Follow-up biopsy versus biochemical assays to exclude pheochromocytoma and referral to urology for possible resection recommended.  Echocardiogram February 2024 showed normal LV function, mild right ventricular enlargement, mild tricuspid regurgitation, mild left atrial enlargement and mild right atrial enlargement.  Patient had resection of mass in March.  Pathology by report was benign.  She has had difficulties with orthostatic hypotension since. Since last seen, she has some dizziness with standing but has not had syncope.  She denies dyspnea or chest pain.  Current Outpatient Medications  Medication Sig Dispense Refill   aspirin 81 MG tablet Take 81 mg by mouth daily.     atorvastatin (LIPITOR) 20 MG tablet TAKE 1 TABLET BY MOUTH EVERY DAY 90 tablet 0   BREZTRI AEROSPHERE 160-9-4.8 MCG/ACT AERO INHALE 2 PUFFS INTO THE LUNGS IN THE MORNING AND AT BEDTIME. 10.7 g 4   buPROPion (WELLBUTRIN XL) 150 MG 24 hr tablet TAKE 2 TABLETS BY MOUTH EVERY DAY 180 tablet 1   donepezil (ARICEPT) 10 MG tablet TAKE 1 TABLET BY MOUTH EVERY DAY 90 tablet 3   DULoxetine (CYMBALTA) 60 MG capsule TAKE 1 CAPSULE BY MOUTH TWICE A DAY 180 capsule 1   fluticasone (FLONASE) 50 MCG/ACT nasal spray SPRAY 2 SPRAYS INTO EACH NOSTRIL EVERY DAY 48 mL 1   furosemide (LASIX) 20 MG tablet Take daily as needed for lower extremity swelling. Needs appointment for another refill 90 tablet 0   ipratropium-albuterol (DUONEB) 0.5-2.5 (3) MG/3ML SOLN Take 3 mLs by nebulization every 4 (four) hours as needed. 360 mL 1   levalbuterol (XOPENEX HFA) 45 MCG/ACT inhaler INHALE 1 TO 2 PUFFS BY MOUTH EVERY 6 HOURS AS NEEDED FOR WHEEZE 15 each 5   meloxicam (MOBIC) 15 MG tablet Take 1 tablet (15 mg total) by mouth daily. 90 tablet 0    midodrine (PROAMATINE) 10 MG tablet TAKE 1 TABLET BY MOUTH THREE TIMES A DAY 270 tablet 1   montelukast (SINGULAIR) 10 MG tablet TAKE 1 TABLET BY MOUTH EVERYDAY AT BEDTIME 90 tablet 4   OLANZapine (ZYPREXA) 10 MG tablet Take 1 tablet (10 mg total) by mouth at bedtime. 90 tablet 0   ondansetron (ZOFRAN) 8 MG tablet TAKE 1 TABLET BY MOUTH EVERY 8 HOURS AS NEEDED FOR NAUSEA AND VOMITING 20 tablet 0   primidone (MYSOLINE) 50 MG tablet Take 1 tablet (50 mg total) by mouth at bedtime. 90 tablet 1   rifaximin (XIFAXAN) 550 MG TABS tablet Take 1 tablet (550 mg total) by mouth 3 (three) times daily. For 14 days. 42 tablet 0   rOPINIRole (REQUIP) 4 MG tablet Take 1 tablet (4 mg total) by mouth at bedtime. 90 tablet 1   tirzepatide (MOUNJARO) 10 MG/0.5ML Pen Inject 10 mg into the skin once a week. 6 mL 1   topiramate (TOPAMAX) 100 MG tablet TAKE 1 TABLET BY MOUTH TWICE A DAY 180 tablet 1   traZODone (DESYREL) 100 MG tablet TAKE 1 TABLET BY MOUTH EVERYDAY AT BEDTIME 90 tablet 1   No current facility-administered medications for this visit.     Past Medical History:  Diagnosis Date   Diabetes mellitus without complication (HCC)    Hyperlipidemia    Orthostatic hypotension    Thyroid disease     Past Surgical History:  Procedure Laterality  Date   ABDOMINAL HYSTERECTOMY     Adrenal mass resection     CARPAL TUNNEL RELEASE     both wrists   CATARACT EXTRACTION, BILATERAL     CERVICAL FUSION     GALLBLADDER SURGERY     right foot surgery     THYROIDECTOMY, PARTIAL     right side removed    Social History   Socioeconomic History   Marital status: Divorced    Spouse name: Not on file   Number of children: 3   Years of education: 13.5   Highest education level: Associate degree: occupational, Scientist, product/process development, or vocational program  Occupational History   Occupation: Retired  Tobacco Use   Smoking status: Former    Current packs/day: 0.00    Types: Cigarettes    Quit date: 02/12/2014     Years since quitting: 9.0   Smokeless tobacco: Never  Vaping Use   Vaping status: Never Used  Substance and Sexual Activity   Alcohol use: No    Alcohol/week: 0.0 standard drinks of alcohol   Drug use: No   Sexual activity: Not on file  Other Topics Concern   Not on file  Social History Narrative   Lives with her daughter and grand daughter. She enjoys spending time with family.   Social Determinants of Health   Financial Resource Strain: Low Risk  (01/02/2023)   Overall Financial Resource Strain (CARDIA)    Difficulty of Paying Living Expenses: Not hard at all  Food Insecurity: No Food Insecurity (01/02/2023)   Hunger Vital Sign    Worried About Running Out of Food in the Last Year: Never true    Ran Out of Food in the Last Year: Never true  Transportation Needs: No Transportation Needs (01/02/2023)   PRAPARE - Administrator, Civil Service (Medical): No    Lack of Transportation (Non-Medical): No  Physical Activity: Inactive (01/02/2023)   Exercise Vital Sign    Days of Exercise per Week: 0 days    Minutes of Exercise per Session: 0 min  Stress: No Stress Concern Present (01/02/2023)   [employer] of Occupational Health - Occupational Stress Questionnaire    Feeling of Stress : Only a little  Social Connections: Socially Isolated (01/02/2023)   Social Connection and Isolation Panel [NHANES]    Frequency of Communication with Friends and Family: More than three times a week    Frequency of Social Gatherings with Friends and Family: More than three times a week    Attends Religious Services: Never    Database administrator or Organizations: No    Attends Banker Meetings: Never    Marital Status: Divorced  Catering manager Violence: Not At Risk (01/02/2023)   Humiliation, Afraid, Rape, and Kick questionnaire    Fear of Current or Ex-Partner: No    Emotionally Abused: No    Physically Abused: No    Sexually Abused: No    Family History  Problem  Relation Age of Onset   Depression Mother    Heart attack Father    Diabetes Father    Uterine cancer Other    Pancreatic cancer Other    Hypertension Other        parents    ROS: no fevers or chills, productive cough, hemoptysis, dysphasia, odynophagia, melena, hematochezia, dysuria, hematuria, rash, seizure activity, orthopnea, PND, pedal edema, claudication. Remaining systems are negative.  Physical Exam: Well-developed well-nourished in no acute distress.  Skin is warm and dry.  HEENT is normal.  Neck is supple.  Chest is clear to auscultation with normal expansion.  Cardiovascular exam is regular rate and rhythm.  Abdominal exam nontender or distended. No masses palpated. Extremities show no edema. neuro grossly intact   A/P  1 orthostatic hypotension-patient has some dizziness with standing occasionally but no syncope.  Will continue midodrine at present dose.  We discussed the importance of fluid intake and increase sodium intake.  We also discussed compression hose.  She has lost close to 100 pounds on [facility] which is likely contributing as well.  2 hyperlipidemia-continue statin.  Olga Millers, MD

## 2023-03-15 DIAGNOSIS — H35372 Puckering of macula, left eye: Secondary | ICD-10-CM | POA: Diagnosis not present

## 2023-03-15 DIAGNOSIS — H353133 Nonexudative age-related macular degeneration, bilateral, advanced atrophic without subfoveal involvement: Secondary | ICD-10-CM | POA: Diagnosis not present

## 2023-03-15 DIAGNOSIS — H35033 Hypertensive retinopathy, bilateral: Secondary | ICD-10-CM | POA: Diagnosis not present

## 2023-03-15 DIAGNOSIS — H43811 Vitreous degeneration, right eye: Secondary | ICD-10-CM | POA: Diagnosis not present

## 2023-03-15 DIAGNOSIS — Z961 Presence of intraocular lens: Secondary | ICD-10-CM | POA: Diagnosis not present

## 2023-03-16 ENCOUNTER — Ambulatory Visit: Payer: Medicare Other | Admitting: Physician Assistant

## 2023-03-16 DIAGNOSIS — G4730 Sleep apnea, unspecified: Secondary | ICD-10-CM | POA: Diagnosis not present

## 2023-03-16 DIAGNOSIS — M51369 Other intervertebral disc degeneration, lumbar region without mention of lumbar back pain or lower extremity pain: Secondary | ICD-10-CM | POA: Diagnosis not present

## 2023-03-16 DIAGNOSIS — M899 Disorder of bone, unspecified: Secondary | ICD-10-CM | POA: Diagnosis not present

## 2023-03-16 DIAGNOSIS — Z4789 Encounter for other orthopedic aftercare: Secondary | ICD-10-CM | POA: Diagnosis not present

## 2023-03-16 DIAGNOSIS — Z7951 Long term (current) use of inhaled steroids: Secondary | ICD-10-CM | POA: Diagnosis not present

## 2023-03-16 DIAGNOSIS — Z7985 Long-term (current) use of injectable non-insulin antidiabetic drugs: Secondary | ICD-10-CM | POA: Diagnosis not present

## 2023-03-16 DIAGNOSIS — I10 Essential (primary) hypertension: Secondary | ICD-10-CM | POA: Diagnosis not present

## 2023-03-16 DIAGNOSIS — I739 Peripheral vascular disease, unspecified: Secondary | ICD-10-CM | POA: Diagnosis not present

## 2023-03-16 DIAGNOSIS — M216X1 Other acquired deformities of right foot: Secondary | ICD-10-CM | POA: Diagnosis not present

## 2023-03-16 DIAGNOSIS — T8484XA Pain due to internal orthopedic prosthetic devices, implants and grafts, initial encounter: Secondary | ICD-10-CM | POA: Diagnosis not present

## 2023-03-16 DIAGNOSIS — J449 Chronic obstructive pulmonary disease, unspecified: Secondary | ICD-10-CM | POA: Diagnosis not present

## 2023-03-16 DIAGNOSIS — F418 Other specified anxiety disorders: Secondary | ICD-10-CM | POA: Diagnosis not present

## 2023-03-16 DIAGNOSIS — E119 Type 2 diabetes mellitus without complications: Secondary | ICD-10-CM | POA: Diagnosis not present

## 2023-03-16 DIAGNOSIS — Z87891 Personal history of nicotine dependence: Secondary | ICD-10-CM | POA: Diagnosis not present

## 2023-03-16 DIAGNOSIS — M2042 Other hammer toe(s) (acquired), left foot: Secondary | ICD-10-CM | POA: Diagnosis not present

## 2023-03-16 DIAGNOSIS — Z6836 Body mass index (BMI) 36.0-36.9, adult: Secondary | ICD-10-CM | POA: Diagnosis not present

## 2023-03-16 DIAGNOSIS — M170 Bilateral primary osteoarthritis of knee: Secondary | ICD-10-CM | POA: Diagnosis not present

## 2023-03-16 DIAGNOSIS — Z7982 Long term (current) use of aspirin: Secondary | ICD-10-CM | POA: Diagnosis not present

## 2023-03-16 DIAGNOSIS — M2041 Other hammer toe(s) (acquired), right foot: Secondary | ICD-10-CM | POA: Diagnosis not present

## 2023-03-16 DIAGNOSIS — Z791 Long term (current) use of non-steroidal anti-inflammatories (NSAID): Secondary | ICD-10-CM | POA: Diagnosis not present

## 2023-03-16 DIAGNOSIS — K219 Gastro-esophageal reflux disease without esophagitis: Secondary | ICD-10-CM | POA: Diagnosis not present

## 2023-03-16 DIAGNOSIS — D369 Benign neoplasm, unspecified site: Secondary | ICD-10-CM | POA: Diagnosis not present

## 2023-03-16 DIAGNOSIS — E785 Hyperlipidemia, unspecified: Secondary | ICD-10-CM | POA: Diagnosis not present

## 2023-03-16 DIAGNOSIS — M898X7 Other specified disorders of bone, ankle and foot: Secondary | ICD-10-CM | POA: Diagnosis not present

## 2023-03-16 DIAGNOSIS — F332 Major depressive disorder, recurrent severe without psychotic features: Secondary | ICD-10-CM | POA: Diagnosis not present

## 2023-03-19 DIAGNOSIS — Z9981 Dependence on supplemental oxygen: Secondary | ICD-10-CM | POA: Diagnosis not present

## 2023-03-19 DIAGNOSIS — J432 Centrilobular emphysema: Secondary | ICD-10-CM | POA: Diagnosis not present

## 2023-03-19 DIAGNOSIS — G4733 Obstructive sleep apnea (adult) (pediatric): Secondary | ICD-10-CM | POA: Diagnosis not present

## 2023-03-19 DIAGNOSIS — J9611 Chronic respiratory failure with hypoxia: Secondary | ICD-10-CM | POA: Diagnosis not present

## 2023-03-19 DIAGNOSIS — J309 Allergic rhinitis, unspecified: Secondary | ICD-10-CM | POA: Diagnosis not present

## 2023-03-20 ENCOUNTER — Encounter: Payer: Self-pay | Admitting: Physician Assistant

## 2023-03-20 NOTE — Telephone Encounter (Signed)
Patient scheduled.

## 2023-03-21 ENCOUNTER — Ambulatory Visit (INDEPENDENT_AMBULATORY_CARE_PROVIDER_SITE_OTHER): Payer: Medicare Other | Admitting: Physician Assistant

## 2023-03-21 ENCOUNTER — Encounter: Payer: Self-pay | Admitting: Physician Assistant

## 2023-03-21 VITALS — BP 120/62 | HR 70 | Temp 97.5°F | Resp 20 | Ht 64.0 in | Wt 177.0 lb

## 2023-03-21 DIAGNOSIS — G894 Chronic pain syndrome: Secondary | ICD-10-CM

## 2023-03-21 DIAGNOSIS — D508 Other iron deficiency anemias: Secondary | ICD-10-CM

## 2023-03-21 DIAGNOSIS — I959 Hypotension, unspecified: Secondary | ICD-10-CM

## 2023-03-21 DIAGNOSIS — J9611 Chronic respiratory failure with hypoxia: Secondary | ICD-10-CM

## 2023-03-21 DIAGNOSIS — E1142 Type 2 diabetes mellitus with diabetic polyneuropathy: Secondary | ICD-10-CM | POA: Diagnosis not present

## 2023-03-21 DIAGNOSIS — R52 Pain, unspecified: Secondary | ICD-10-CM | POA: Diagnosis not present

## 2023-03-21 MED ORDER — OXYCODONE HCL 15 MG PO TABS: 15.0000 mg | ORAL_TABLET | Freq: Three times a day (TID) | ORAL | 0 refills | Status: AC | PRN

## 2023-03-21 MED ORDER — OXYCODONE HCL 15 MG PO TABS: 15.0000 mg | ORAL_TABLET | Freq: Three times a day (TID) | ORAL | 0 refills | Status: DC | PRN

## 2023-03-21 NOTE — Progress Notes (Signed)
Established Patient Office Visit  Subjective   Patient ID: Sarah Wright, female    DOB: 02-07-1946  Age: 77 y.o. MRN: [id number]  Chief Complaint  Patient presents with   Medical Management of Chronic Issues   Diabetes   Pain Management    Diabetes   77 yo female presents today for DM follow up and pain management. Her last A1c was 4.8. She wants to increase her Mounjaro dosage to lose more weight. Her daughter reports she does not eat a lot. She also is not exercising. She received a right foot surgery to remove hardware on 03/16/23. She is following up with Dr. Ladona Ridgel. She is still in pain and needs a refill on her oxycodone.  Her BP was initially low today. Denies recent falls.  She also reports taking off her oxygen sometimes.  .. Active Ambulatory Problems    Diagnosis Date Noted   Anxiety and depression 03/16/2014   Dyslipidemia, goal LDL below 70 03/16/2014   COLD (chronic obstructive lung disease) (HCC) 03/16/2014   Insomnia 03/16/2014   Esophageal reflux 03/16/2014   Bilateral hand pain 03/16/2014   DDD (degenerative disc disease), lumbar 03/16/2014   Lumbar spondylosis 03/17/2014   Aortic atherosclerosis (HCC) 03/17/2014   Chronic venous insufficiency 03/27/2014   H/O colonoscopy 04/02/2014   Thyroid disease 04/02/2014   Cataract 04/02/2014   Type 2 diabetes mellitus (HCC) 04/02/2014   Primary osteoarthritis of both knees 07/21/2014   Class 3 severe obesity due to excess calories with serious comorbidity and body mass index (BMI) of 40.0 to 44.9 in adult [facility]) 10/07/2014   Vertigo 01/04/2015   Sensorineural hearing loss of both ears 01/26/2015   Thyroid nodule 06/22/2015   Memory loss 07/29/2015   Psoriasis 09/23/2015   Right shoulder injury 01/27/2016   Balance problem 08/13/2016   Acute pain of left shoulder 08/13/2016   Numbness around mouth 09/08/2016   DDD (degenerative disc disease), cervical 12/29/2016   Microalbuminuria 01/30/2017   Chronic pain  syndrome 05/06/2017   Skin abrasion 08/01/2017   Leukocytes in urine 08/01/2017   Dysuria 08/01/2017   Panic attack 08/01/2017   RLS (restless legs syndrome) 08/12/2018   COPD exacerbation (HCC) 08/14/2019   Right ankle pain 01/20/2020   Severe episode of recurrent major depressive disorder, without psychotic features (HCC) 08/20/2020   Excessive crying 08/20/2020   Orthostatic hypotension 03/22/2021   Bilateral impacted cerumen 06/14/2021   Callus of toe 07/15/2021   Pain management 07/26/2021   SOB (shortness of breath) on exertion 10/26/2021   Hypoxia 10/26/2021   Lung crackles 10/26/2021   Left adrenal mass (HCC) 11/08/2021   Fatty liver disease, nonalcoholic 11/11/2021   Fall 12/19/2021   Bilateral primary osteoarthritis of knee 12/28/2021   Chronic respiratory failure with hypoxia (HCC) 02/20/2022   GAD (generalized anxiety disorder) 02/20/2022   Itching 10/16/2022   Incontinence of feces 12/15/2022   Iron deficiency anemia secondary to inadequate dietary iron intake 03/26/2023   Hypotension 03/26/2023   Resolved Ambulatory Problems    Diagnosis Date Noted   No Resolved Ambulatory Problems   Past Medical History:  Diagnosis Date   Diabetes mellitus without complication (HCC)    Hyperlipidemia          Objective:     BP 120/62 (BP Location: Left Arm)   Pulse 70   Temp (!) 97.5 F (36.4 C)   Resp 20   Ht 5\' 4"  (1.626 m)   Wt 177 lb (80.3 kg)   SpO2 100%  BMI 30.38 kg/m  BP Readings from Last 3 Encounters:  03/21/23 120/62  03/14/23 120/63  12/13/22 104/70   Wt Readings from Last 3 Encounters:  03/21/23 177 lb (80.3 kg)  03/14/23 178 lb 6.4 oz (80.9 kg)  12/13/22 184 lb (83.5 kg)      Physical Exam Constitutional:      Appearance: Normal appearance. She is obese.  HENT:     Head: Normocephalic.  Cardiovascular:     Rate and Rhythm: Normal rate and regular rhythm.  Pulmonary:     Effort: Pulmonary effort is normal.     Breath sounds:  Normal breath sounds.     Comments: On 2-3L of O2 continuously.  Musculoskeletal:     Cervical back: Normal range of motion.     Right lower leg: No edema.     Left lower leg: No edema.  Neurological:     General: No focal deficit present.     Mental Status: She is alert and oriented to person, place, and time.  Psychiatric:        Mood and Affect: Mood normal.     Last hemoglobin A1c Lab Results  Component Value Date   HGBA1C 4.7 (L) 03/21/2023        Assessment & Plan:  [name] KitchenMarland KitchenLuanne was seen today for medical management of chronic issues, diabetes and pain management.  Diagnoses and all orders for this visit:  Type 2 diabetes mellitus with diabetic polyneuropathy, without long-term current use of insulin (HCC) -     POCT UA - Microalbumin -     CMP14+EGFR -     Hemoglobin A1c -     CBC w/Diff/Platelet -     Fe+TIBC+Fer  Iron deficiency anemia secondary to inadequate dietary iron intake -     CBC w/Diff/Platelet -     Fe+TIBC+Fer  Hypotension, unspecified hypotension type  Pain management -     oxyCODONE (ROXICODONE) 15 MG immediate release tablet; Take 1 tablet (15 mg total) by mouth every 8 (eight) hours as needed for pain. -     oxyCODONE (ROXICODONE) 15 MG immediate release tablet; Take 1 tablet (15 mg total) by mouth every 8 (eight) hours as needed for pain. -     oxyCODONE (ROXICODONE) 15 MG immediate release tablet; Take 1 tablet (15 mg total) by mouth every 8 (eight) hours as needed for pain.  Chronic pain syndrome -     oxyCODONE (ROXICODONE) 15 MG immediate release tablet; Take 1 tablet (15 mg total) by mouth every 8 (eight) hours as needed for pain. -     oxyCODONE (ROXICODONE) 15 MG immediate release tablet; Take 1 tablet (15 mg total) by mouth every 8 (eight) hours as needed for pain. -     oxyCODONE (ROXICODONE) 15 MG immediate release tablet; Take 1 tablet (15 mg total) by mouth every 8 (eight) hours as needed for pain.  Chronic respiratory failure with  hypoxia (HCC)   A1C have been under control, will check in blood today Do not increase Mounjaro if A1c well controlled due to risk of muscle loss and decreased mobility. Recommended increasing exercising and improving diet.  Advised patient to use her O2 if her O2 sat gets below 88%.  Advised patient that if SBP <100, do not take Lasix. BP did come up with manuel in office  Pain contract UTD. [name].PDMP reviewed during this encounter. UDS UTD Continue oxycodone   Patient declined COVID vaccine.  Return in about 3 months (around 06/21/2023).  Tandy Gaw, PA-C

## 2023-03-21 NOTE — Patient Instructions (Signed)
Hypotension As the heart beats, it forces blood through the body. Hypotension, commonly called low blood pressure, is when the force of blood pumping through the arteries is too weak. Arteries are blood vessels that carry blood from the heart throughout the body. Depending on the cause and severity, hypotension may be harmless (benign) or may cause serious problems (be critical). When your blood pressure is too low, you may not get enough blood to your brain or to the rest of your organs. This can cause weakness, light-headedness, a rapid heartbeat, and fainting. What are the causes? This condition may be caused by: Blood loss. Loss of body fluids (dehydration). Heart problems. Hormone (endocrine) problems. Pregnancy. Severe infection. Lack of certain nutrients. Severe allergic reactions (anaphylaxis). Certain medicines, such as blood pressure medicine or medicines that make the body lose excess fluids (diuretics). Sometimes, hypotension may be caused by not taking medicine as directed, such as taking too much of a certain medicine. What increases the risk? The following factors may make you more likely to develop this condition: Age. Risk increases as you get older. Having a condition that affects the heart or the central nervous system. What are the signs or symptoms? Common symptoms of this condition include: Weakness. Light-headedness. Dizziness. Blurred vision. Tiredness (fatigue). Rapid heartbeat. Fainting, in severe cases. How is this diagnosed? This condition is diagnosed based on: Your medical history. Your symptoms. Your blood pressure measurement. Your health care provider will check your blood pressure when you are: Lying down. Sitting. Standing. A blood pressure reading is recorded as two numbers, such as "120 over 80" (or 120/80). The first ("top") number is called the systolic pressure. It is a measure of the pressure in your arteries as your heart beats. The second  ("bottom") number is called the diastolic pressure. It is a measure of the pressure in your arteries when your heart relaxes between beats. Blood pressure is measured in a unit called mm Hg. Healthy blood pressure for most adults is 120/80. If your blood pressure is below 90/60, you may be diagnosed with hypotension. Other information or tests that may be used to diagnose hypotension include: Your other vital signs, such as your heart rate and temperature. Blood tests. Tilt table test. For this test, you will be safely secured to a table that moves you from a lying position to an upright position. Your heart rhythm and blood pressure will be monitored during the test. How is this treated? Treatment for this condition may include: Changing your diet. This may involve drinking more water or increasing your salt (sodium) intake with high-sodium foods. Taking medicines to raise your blood pressure. Changing the dosage of certain medicines you are taking that might be lowering your blood pressure. Wearing compression stockings. These stockings help to prevent blood clots and reduce swelling in your legs. In some cases, you may need to go to the hospital for: Fluid replacement. This means you will receive fluids through an IV. Blood replacement. This means you will receive donated blood through an IV (transfusion). Treating an infection or heart problems, if this applies. Monitoring. You may need to be monitored while medicines that you are taking wear off. Follow these instructions at home: Eating and drinking  Drink enough fluid to keep your urine pale yellow. Eat a healthy diet, and follow instructions from your health care provider about eating or drinking restrictions. A healthy diet includes: Fresh fruits and vegetables. Whole grains. Lean meats. Low-fat dairy products. Increase your salt intake if told  to do so. Do not add extra salt to your diet unless your health care provider tells you  to do that. Eat frequent, small meals. Avoid standing up suddenly after eating. Medicines Take over-the-counter and prescription medicines only as told by your health care provider. Follow instructions from your health care provider about changing the dosage of your current medicines, if this applies. Do not stop or adjust any of your medicines on your own. General instructions  Wear compression stockings as told by your health care provider. Get up slowly from lying down or sitting positions. This gives your blood pressure a chance to adjust. Avoid hot showers and excessive heat as directed by your health care provider. Return to your normal activities as told by your health care provider. Ask your health care provider what activities are safe for you. Do not use any products that contain nicotine or tobacco. These products include cigarettes, chewing tobacco, and vaping devices, such as e-cigarettes. If you need help quitting, ask your health care provider. Keep all follow-up visits. This is important. Contact a health care provider if: You vomit. You have diarrhea. You have a fever for more than 2-3 days. You feel more thirsty than usual. You feel weak and tired. Get help right away if: You have chest pain. You have a fast or irregular heartbeat. You develop numbness in any part of your body. You cannot move your arms or your legs. You have trouble speaking. You become sweaty or feel light-headed. You faint. You feel short of breath. You have trouble staying awake. You feel confused. These symptoms may be an emergency. Get help right away. Call 911. Do not wait to see if the symptoms will go away. Do not drive yourself to the hospital. Summary Hypotension is when the force of blood pumping through the arteries is too weak. Hypotension may be harmless (benign) or may cause serious problems (be critical). Treatment for this condition may include changing your diet, changing  your medicines, and wearing compression stockings. In some cases, you may need to go to the hospital for fluid or blood replacement. This information is not intended to replace advice given to you by your health care provider. Make sure you discuss any questions you have with your health care provider. Document Revised: 11/29/2020 Document Reviewed: 11/29/2020 Elsevier Patient Education  2024 [employer].

## 2023-03-22 LAB — CMP14+EGFR
ALT: 26 [IU]/L (ref 0–32)
AST: 21 [IU]/L (ref 0–40)
Albumin: 3.8 g/dL (ref 3.8–4.8)
Alkaline Phosphatase: 131 [IU]/L — ABNORMAL HIGH (ref 44–121)
BUN/Creatinine Ratio: 22 (ref 12–28)
BUN: 20 mg/dL (ref 8–27)
Bilirubin Total: 0.3 mg/dL (ref 0.0–1.2)
CO2: 23 mmol/L (ref 20–29)
Calcium: 9 mg/dL (ref 8.7–10.3)
Chloride: 108 mmol/L — ABNORMAL HIGH (ref 96–106)
Creatinine, Ser: 0.9 mg/dL (ref 0.57–1.00)
Globulin, Total: 2 g/dL (ref 1.5–4.5)
Glucose: 82 mg/dL (ref 70–99)
Potassium: 4.4 mmol/L (ref 3.5–5.2)
Sodium: 142 mmol/L (ref 134–144)
Total Protein: 5.8 g/dL — ABNORMAL LOW (ref 6.0–8.5)
eGFR: 65 mL/min/{1.73_m2} (ref >59)

## 2023-03-22 LAB — CBC WITH DIFFERENTIAL/PLATELET
Basophils Absolute: 0.1 10*3/uL (ref 0.0–0.2)
Basos: 2 %
EOS (ABSOLUTE): 0.4 10*3/uL (ref 0.0–0.4)
Eos: 7 %
Hematocrit: 35.2 % (ref 34.0–46.6)
Hemoglobin: 11.1 g/dL (ref 11.1–15.9)
Immature Grans (Abs): 0 10*3/uL (ref 0.0–0.1)
Immature Granulocytes: 0 %
Lymphocytes Absolute: 0.8 10*3/uL (ref 0.7–3.1)
Lymphs: 14 %
MCH: 27.8 pg (ref 26.6–33.0)
MCHC: 31.5 g/dL (ref 31.5–35.7)
MCV: 88 fL (ref 79–97)
Monocytes Absolute: 0.6 10*3/uL (ref 0.1–0.9)
Monocytes: 10 %
Neutrophils Absolute: 4.2 10*3/uL (ref 1.4–7.0)
Neutrophils: 67 %
Platelets: 203 10*3/uL (ref 150–450)
RBC: 4 x10E6/uL (ref 3.77–5.28)
RDW: 13.2 % (ref 11.7–15.4)
WBC: 6.2 10*3/uL (ref 3.4–10.8)

## 2023-03-22 LAB — IRON,TIBC AND FERRITIN PANEL
Ferritin: 47 ng/mL (ref 15–150)
Iron Saturation: 12 % — ABNORMAL LOW (ref 15–55)
Iron: 33 ug/dL (ref 27–139)
Total Iron Binding Capacity: 282 ug/dL (ref 250–450)
UIBC: 249 ug/dL (ref 118–369)

## 2023-03-22 LAB — HEMOGLOBIN A1C
Est. average glucose Bld gHb Est-mCnc: 88 mg/dL
Hgb A1c MFr Bld: 4.7 % — ABNORMAL LOW (ref 4.8–5.6)

## 2023-03-26 ENCOUNTER — Encounter: Payer: Self-pay | Admitting: Physician Assistant

## 2023-03-26 DIAGNOSIS — I959 Hypotension, unspecified: Secondary | ICD-10-CM | POA: Insufficient documentation

## 2023-03-26 DIAGNOSIS — D508 Other iron deficiency anemias: Secondary | ICD-10-CM | POA: Insufficient documentation

## 2023-03-26 NOTE — Progress Notes (Signed)
Sarah Wright,   Kidney function looks good.  Protein low and A1C looks GREAT so we do not need to increase mounjaro.  Hemoglobin is back up some but still iron stores are low.  Are you taking any iron supplement?

## 2023-04-02 DIAGNOSIS — H53411 Scotoma involving central area, right eye: Secondary | ICD-10-CM | POA: Diagnosis not present

## 2023-04-04 DIAGNOSIS — Z4789 Encounter for other orthopedic aftercare: Secondary | ICD-10-CM | POA: Diagnosis not present

## 2023-04-06 ENCOUNTER — Other Ambulatory Visit: Payer: Self-pay | Admitting: Physician Assistant

## 2023-04-06 DIAGNOSIS — F332 Major depressive disorder, recurrent severe without psychotic features: Secondary | ICD-10-CM

## 2023-04-07 ENCOUNTER — Other Ambulatory Visit: Payer: Self-pay | Admitting: Physician Assistant

## 2023-04-07 DIAGNOSIS — E7849 Other hyperlipidemia: Secondary | ICD-10-CM

## 2023-04-13 DIAGNOSIS — Z882 Allergy status to sulfonamides status: Secondary | ICD-10-CM | POA: Diagnosis not present

## 2023-04-13 DIAGNOSIS — Z87891 Personal history of nicotine dependence: Secondary | ICD-10-CM | POA: Diagnosis not present

## 2023-04-13 DIAGNOSIS — E785 Hyperlipidemia, unspecified: Secondary | ICD-10-CM | POA: Diagnosis not present

## 2023-04-13 DIAGNOSIS — Z6828 Body mass index (BMI) 28.0-28.9, adult: Secondary | ICD-10-CM | POA: Diagnosis not present

## 2023-04-13 DIAGNOSIS — Z7982 Long term (current) use of aspirin: Secondary | ICD-10-CM | POA: Diagnosis not present

## 2023-04-13 DIAGNOSIS — G4733 Obstructive sleep apnea (adult) (pediatric): Secondary | ICD-10-CM | POA: Diagnosis not present

## 2023-04-13 DIAGNOSIS — F419 Anxiety disorder, unspecified: Secondary | ICD-10-CM | POA: Diagnosis not present

## 2023-04-13 DIAGNOSIS — K219 Gastro-esophageal reflux disease without esophagitis: Secondary | ICD-10-CM | POA: Diagnosis not present

## 2023-04-13 DIAGNOSIS — K5909 Other constipation: Secondary | ICD-10-CM | POA: Diagnosis not present

## 2023-04-13 DIAGNOSIS — F32A Depression, unspecified: Secondary | ICD-10-CM | POA: Diagnosis not present

## 2023-04-13 DIAGNOSIS — Z79899 Other long term (current) drug therapy: Secondary | ICD-10-CM | POA: Diagnosis not present

## 2023-04-13 DIAGNOSIS — I10 Essential (primary) hypertension: Secondary | ICD-10-CM | POA: Diagnosis not present

## 2023-04-13 DIAGNOSIS — J449 Chronic obstructive pulmonary disease, unspecified: Secondary | ICD-10-CM | POA: Diagnosis not present

## 2023-04-13 DIAGNOSIS — Z888 Allergy status to other drugs, medicaments and biological substances status: Secondary | ICD-10-CM | POA: Diagnosis not present

## 2023-04-13 DIAGNOSIS — K625 Hemorrhage of anus and rectum: Secondary | ICD-10-CM | POA: Diagnosis not present

## 2023-04-13 DIAGNOSIS — E119 Type 2 diabetes mellitus without complications: Secondary | ICD-10-CM | POA: Diagnosis not present

## 2023-04-13 DIAGNOSIS — E669 Obesity, unspecified: Secondary | ICD-10-CM | POA: Diagnosis not present

## 2023-04-13 DIAGNOSIS — K5730 Diverticulosis of large intestine without perforation or abscess without bleeding: Secondary | ICD-10-CM | POA: Diagnosis not present

## 2023-04-13 DIAGNOSIS — R197 Diarrhea, unspecified: Secondary | ICD-10-CM | POA: Diagnosis not present

## 2023-04-13 DIAGNOSIS — K648 Other hemorrhoids: Secondary | ICD-10-CM | POA: Diagnosis not present

## 2023-04-16 DIAGNOSIS — H53411 Scotoma involving central area, right eye: Secondary | ICD-10-CM | POA: Diagnosis not present

## 2023-04-19 ENCOUNTER — Other Ambulatory Visit: Payer: Self-pay | Admitting: Physician Assistant

## 2023-04-19 DIAGNOSIS — F332 Major depressive disorder, recurrent severe without psychotic features: Secondary | ICD-10-CM

## 2023-04-19 DIAGNOSIS — F419 Anxiety disorder, unspecified: Secondary | ICD-10-CM

## 2023-04-23 DIAGNOSIS — H53411 Scotoma involving central area, right eye: Secondary | ICD-10-CM | POA: Diagnosis not present

## 2023-04-27 ENCOUNTER — Telehealth: Payer: Self-pay

## 2023-04-27 NOTE — Telephone Encounter (Signed)
 Copied from CRM [id number]. Topic: Clinical - Medication Question >> Apr 26, 2023  1:28 PM Almira Coaster wrote: Reason for CRM: Patient Is calling to advise that tirzepatide [facility]) 10 MG/0.5ML Pen  requires a pre-auth through her insurance company.

## 2023-04-30 DIAGNOSIS — H53411 Scotoma involving central area, right eye: Secondary | ICD-10-CM | POA: Diagnosis not present

## 2023-05-03 ENCOUNTER — Other Ambulatory Visit: Payer: Self-pay | Admitting: Family Medicine

## 2023-05-03 DIAGNOSIS — F5101 Primary insomnia: Secondary | ICD-10-CM

## 2023-05-04 ENCOUNTER — Other Ambulatory Visit: Payer: Self-pay | Admitting: Physician Assistant

## 2023-05-04 DIAGNOSIS — F410 Panic disorder [episodic paroxysmal anxiety] without agoraphobia: Secondary | ICD-10-CM

## 2023-05-04 DIAGNOSIS — M25512 Pain in left shoulder: Secondary | ICD-10-CM

## 2023-05-07 DIAGNOSIS — L851 Acquired keratosis [keratoderma] palmaris et plantaris: Secondary | ICD-10-CM | POA: Diagnosis not present

## 2023-05-07 DIAGNOSIS — H53411 Scotoma involving central area, right eye: Secondary | ICD-10-CM | POA: Diagnosis not present

## 2023-05-07 DIAGNOSIS — R234 Changes in skin texture: Secondary | ICD-10-CM | POA: Diagnosis not present

## 2023-05-07 DIAGNOSIS — I872 Venous insufficiency (chronic) (peripheral): Secondary | ICD-10-CM | POA: Diagnosis not present

## 2023-05-07 DIAGNOSIS — M216X1 Other acquired deformities of right foot: Secondary | ICD-10-CM | POA: Diagnosis not present

## 2023-05-07 DIAGNOSIS — B351 Tinea unguium: Secondary | ICD-10-CM | POA: Diagnosis not present

## 2023-05-07 DIAGNOSIS — M2041 Other hammer toe(s) (acquired), right foot: Secondary | ICD-10-CM | POA: Diagnosis not present

## 2023-05-07 DIAGNOSIS — I739 Peripheral vascular disease, unspecified: Secondary | ICD-10-CM | POA: Diagnosis not present

## 2023-05-07 DIAGNOSIS — M2042 Other hammer toe(s) (acquired), left foot: Secondary | ICD-10-CM | POA: Diagnosis not present

## 2023-05-08 ENCOUNTER — Other Ambulatory Visit: Payer: Self-pay | Admitting: Physician Assistant

## 2023-05-10 ENCOUNTER — Telehealth: Payer: Self-pay

## 2023-05-10 NOTE — Telephone Encounter (Signed)
Copied from CRM [id number]. Topic: Clinical - Medication Question >> May 09, 2023  3:08 PM Nila Nephew wrote: Reason for CRM: Patient calling to inquire about status of prior authorization [employer]. Informed patient no update as of yet.

## 2023-05-14 DIAGNOSIS — H53411 Scotoma involving central area, right eye: Secondary | ICD-10-CM | POA: Diagnosis not present

## 2023-05-15 ENCOUNTER — Encounter: Payer: Self-pay | Admitting: Physician Assistant

## 2023-05-15 DIAGNOSIS — F410 Panic disorder [episodic paroxysmal anxiety] without agoraphobia: Secondary | ICD-10-CM

## 2023-05-17 DIAGNOSIS — N200 Calculus of kidney: Secondary | ICD-10-CM | POA: Diagnosis not present

## 2023-05-17 DIAGNOSIS — N281 Cyst of kidney, acquired: Secondary | ICD-10-CM | POA: Diagnosis not present

## 2023-05-17 DIAGNOSIS — E278 Other specified disorders of adrenal gland: Secondary | ICD-10-CM | POA: Diagnosis not present

## 2023-05-20 ENCOUNTER — Other Ambulatory Visit: Payer: Self-pay | Admitting: Physician Assistant

## 2023-05-20 DIAGNOSIS — I951 Orthostatic hypotension: Secondary | ICD-10-CM

## 2023-05-21 DIAGNOSIS — H53411 Scotoma involving central area, right eye: Secondary | ICD-10-CM | POA: Diagnosis not present

## 2023-05-23 ENCOUNTER — Encounter: Payer: Self-pay | Admitting: Physician Assistant

## 2023-05-23 ENCOUNTER — Ambulatory Visit: Payer: Medicare Other

## 2023-05-23 ENCOUNTER — Telehealth: Payer: Self-pay | Admitting: Sports Medicine

## 2023-05-23 ENCOUNTER — Other Ambulatory Visit (INDEPENDENT_AMBULATORY_CARE_PROVIDER_SITE_OTHER): Payer: Medicare Other

## 2023-05-23 ENCOUNTER — Ambulatory Visit (INDEPENDENT_AMBULATORY_CARE_PROVIDER_SITE_OTHER): Payer: Medicare Other | Admitting: Sports Medicine

## 2023-05-23 DIAGNOSIS — M170 Bilateral primary osteoarthritis of knee: Secondary | ICD-10-CM

## 2023-05-23 DIAGNOSIS — M25461 Effusion, right knee: Secondary | ICD-10-CM | POA: Diagnosis not present

## 2023-05-23 DIAGNOSIS — M25462 Effusion, left knee: Secondary | ICD-10-CM | POA: Diagnosis not present

## 2023-05-23 MED ORDER — TRIAMCINOLONE ACETONIDE 40 MG/ML IJ SUSP
80.0000 mg | Freq: Once | INTRAMUSCULAR | Status: AC
  Administered 2023-05-23: 80 mg via INTRAMUSCULAR

## 2023-05-23 MED ORDER — HYDROXYZINE PAMOATE 25 MG PO CAPS: ORAL_CAPSULE | ORAL | 2 refills | Status: DC

## 2023-05-23 NOTE — Telephone Encounter (Signed)
Sarah Wright (Key: [id number])  PA for [facility] submitted today  Your information has been submitted to [facility] Medicare Part D. [employer] D will review the request and will issue a decision, typically within 1-3 days from your submission. You can check the updated outcome later by reopening this request.  If [employer] has not responded in 1-3 days or if you have any questions about your ePA request, please contact [employer] at [phone]. If you think there may be a problem with your PA request, use our live chat feature at the bottom right.  Patient also requesting rx rf of hydroxyzine  Last written 07/25/2021 ( not showing in current med list)  Last OV 03/21/2023 Upcoming appt 06/22/2023

## 2023-05-23 NOTE — Addendum Note (Signed)
Addended by: Carren Rang A on: 05/23/2023 03:32 PM   Modules accepted: Orders

## 2023-05-23 NOTE — Assessment & Plan Note (Signed)
Very pleasant 78 year old female, known to me from 2018 when we last did a left knee injection, she has known osteoarthritis. Since then she has been to another provider, Dr. Marlene Bast with orthopedic surgery who deemed her not a good operative candidate. She has also had an adrenalectomy, benign. She is here with persistence of pain, taking meloxicam without improvement. Pain at the joint lines. We will add x-rays, I have injected both of her knees today, we will also get her approved for viscosupplementation. Options after that include geniculate artery embolization.

## 2023-05-23 NOTE — Telephone Encounter (Signed)
[employer] approval please, bilateral x-ray confirmed osteoarthritis, failed over 6 weeks of physical therapy, NSAIDs and steroid injections.

## 2023-05-23 NOTE — Progress Notes (Signed)
    Procedures performed today:    Procedure: Real-time Ultrasound Guided injection of the left knee Device: Samsung HS60  Verbal informed consent obtained.  Time-out conducted.  Noted no overlying erythema, induration, or other signs of local infection.  Skin prepped in a sterile fashion.  Local anesthesia: Topical Ethyl chloride.  With sterile technique and under real time ultrasound guidance: Trace effusion noted, 1 cc Kenalog 40, 2 cc lidocaine, 2 cc bupivacaine injected easily Completed without difficulty  Advised to call if fevers/chills, erythema, induration, drainage, or persistent bleeding.  Images permanently stored and available for review in PACS.  Impression: Technically successful ultrasound guided injection.  Procedure: Real-time Ultrasound Guided injection of the right knee Device: Samsung HS60  Verbal informed consent obtained.  Time-out conducted.  Noted no overlying erythema, induration, or other signs of local infection.  Skin prepped in a sterile fashion.  Local anesthesia: Topical Ethyl chloride.  With sterile technique and under real time ultrasound guidance: Trace effusion noted, 1 cc Kenalog 40, 2 cc lidocaine, 2 cc bupivacaine injected easily Completed without difficulty  Advised to call if fevers/chills, erythema, induration, drainage, or persistent bleeding.  Images permanently stored and available for review in PACS.  Impression: Technically successful ultrasound guided injection.  Independent interpretation of notes and tests performed by another provider:   None.  Brief History, Exam, Impression, and Recommendations:    Primary osteoarthritis of both knees Very pleasant 78 year old female, known to me from 2018 when we last did a left knee injection, she has known osteoarthritis. Since then she has been to another provider, Dr. Marlene Bast with orthopedic surgery who deemed her not a good operative candidate. She has also had an adrenalectomy,  benign. She is here with persistence of pain, taking meloxicam without improvement. Pain at the joint lines. We will add x-rays, I have injected both of her knees today, we will also get her approved for viscosupplementation. Options after that include geniculate artery embolization.    ____________________________________________ Ihor Austin. Benjamin Stain, M.D., ABFM., CAQSM., AME. Primary Care and Sports Medicine [facility]  Adjunct Professor of Family Medicine  [place] [employer] [facility] [employer]  Restaurant manager, fast food

## 2023-05-23 NOTE — Telephone Encounter (Signed)
Patient had an appointment with Dr. Karie Schwalbe and was inquiring about her pre authorization for my [facility] and haven't heard anything. I also asked for a refill for Hydroxizine and it was denied, please advise, thanks.

## 2023-05-23 NOTE — Telephone Encounter (Signed)
PA information submitted via [email or web address] for Orthovisc Paperwork has been printed and given to Dr. Karie Schwalbe for signatures. Once obtained, information will be faxed to [employer] at [phone]

## 2023-05-23 NOTE — Code Documentation (Signed)
done

## 2023-05-28 ENCOUNTER — Ambulatory Visit: Payer: Medicare Other | Admitting: Cardiology

## 2023-05-31 DIAGNOSIS — H353134 Nonexudative age-related macular degeneration, bilateral, advanced atrophic with subfoveal involvement: Secondary | ICD-10-CM | POA: Diagnosis not present

## 2023-06-01 ENCOUNTER — Telehealth: Payer: Medicare Other

## 2023-06-04 DIAGNOSIS — H53411 Scotoma involving central area, right eye: Secondary | ICD-10-CM | POA: Diagnosis not present

## 2023-06-06 ENCOUNTER — Other Ambulatory Visit: Payer: Self-pay | Admitting: Physician Assistant

## 2023-06-11 ENCOUNTER — Encounter: Payer: Self-pay | Admitting: Emergency Medicine

## 2023-06-11 ENCOUNTER — Ambulatory Visit
Admission: EM | Admit: 2023-06-11 | Discharge: 2023-06-11 | Disposition: A | Discharge status: Home / Self Care | Payer: Medicare Other | Attending: Family Medicine | Admitting: Family Medicine

## 2023-06-11 ENCOUNTER — Other Ambulatory Visit: Payer: Self-pay | Admitting: Physician Assistant

## 2023-06-11 DIAGNOSIS — G2581 Restless legs syndrome: Secondary | ICD-10-CM

## 2023-06-11 DIAGNOSIS — I951 Orthostatic hypotension: Secondary | ICD-10-CM

## 2023-06-11 DIAGNOSIS — R059 Cough, unspecified: Secondary | ICD-10-CM | POA: Diagnosis not present

## 2023-06-11 DIAGNOSIS — F332 Major depressive disorder, recurrent severe without psychotic features: Secondary | ICD-10-CM

## 2023-06-11 DIAGNOSIS — J069 Acute upper respiratory infection, unspecified: Secondary | ICD-10-CM

## 2023-06-11 MED ORDER — PREDNISONE 10 MG (21) PO TBPK: ORAL_TABLET | Freq: Every day | ORAL | 0 refills | Status: DC

## 2023-06-11 MED ORDER — DOXYCYCLINE HYCLATE 100 MG PO CAPS: 100.0000 mg | ORAL_CAPSULE | Freq: Two times a day (BID) | ORAL | 0 refills | Status: AC

## 2023-06-11 MED ORDER — BENZONATATE 200 MG PO CAPS: 200.0000 mg | ORAL_CAPSULE | Freq: Three times a day (TID) | ORAL | 0 refills | Status: AC | PRN

## 2023-06-11 MED ORDER — PROMETHAZINE-DM 6.25-15 MG/5ML PO SYRP
5.0000 mL | ORAL_SOLUTION | Freq: Two times a day (BID) | ORAL | 0 refills | Status: DC | PRN
Start: 2023-06-11 — End: 2023-07-18

## 2023-06-11 NOTE — Discharge Instructions (Addendum)
 Advised patient to take medications as directed with food to completion.  Advised patient to take prednisone with first dose of doxycycline until complete.  Advised may take Tessalon capsules daily or as needed for cough.  Advised may take Promethazine DM at night prior to sleep due to sedative effects.  Encouraged to increase daily water intake to 64 ounces per day while taking these medications.  Advised if symptoms worsen and/or unresolved please follow-up PCP or here for further evaluation.

## 2023-06-11 NOTE — ED Triage Notes (Signed)
 Patient c/o cough, head congestion and body aches x 3 weeks.  Patient has taken cough drops.

## 2023-06-11 NOTE — ED Provider Notes (Signed)
 [facility]    CSN: [id number] Arrival date & time: 06/11/23  1008      History   Chief Complaint Chief Complaint  Patient presents with   Cough    HPI Sarah Wright is a 78 y.o. female.   HPI 78 year old female presents with cough and congestion for 3 weeks.  PMH significant for MDD, hypotension, and pain management.  Past Medical History:  Diagnosis Date   Diabetes mellitus without complication (HCC)    Hyperlipidemia    Orthostatic hypotension    Thyroid disease     Patient Active Problem List   Diagnosis Date Noted   Iron deficiency anemia secondary to inadequate dietary iron intake 03/26/2023   Hypotension 03/26/2023   Incontinence of feces 12/15/2022   Itching 10/16/2022   Chronic respiratory failure with hypoxia (HCC) 02/20/2022   GAD (generalized anxiety disorder) 02/20/2022   Bilateral primary osteoarthritis of knee 12/28/2021   Fall 12/19/2021   Fatty liver disease, nonalcoholic 11/11/2021   Left adrenal mass (HCC) 11/08/2021   SOB (shortness of breath) on exertion 10/26/2021   Hypoxia 10/26/2021   Lung crackles 10/26/2021   Pain management 07/26/2021   Callus of toe 07/15/2021   Bilateral impacted cerumen 06/14/2021   Orthostatic hypotension 03/22/2021   Severe episode of recurrent major depressive disorder, without psychotic features (HCC) 08/20/2020   Excessive crying 08/20/2020   Right ankle pain 01/20/2020   COPD exacerbation (HCC) 08/14/2019   RLS (restless legs syndrome) 08/12/2018   Skin abrasion 08/01/2017   Leukocytes in urine 08/01/2017   Dysuria 08/01/2017   Panic attack 08/01/2017   Chronic pain syndrome 05/06/2017   Microalbuminuria 01/30/2017   DDD (degenerative disc disease), cervical 12/29/2016   Numbness around mouth 09/08/2016   Balance problem 08/13/2016   Acute pain of left shoulder 08/13/2016   Right shoulder injury 01/27/2016   Psoriasis 09/23/2015   Memory loss 07/29/2015   Thyroid nodule 06/22/2015    Sensorineural hearing loss of both ears 01/26/2015   Vertigo 01/04/2015   Class 3 severe obesity due to excess calories with serious comorbidity and body mass index (BMI) of 40.0 to 44.9 in adult (HCC) 10/07/2014   Primary osteoarthritis of both knees 07/21/2014   H/O colonoscopy 04/02/2014   Thyroid disease 04/02/2014   Cataract 04/02/2014   Type 2 diabetes mellitus (HCC) 04/02/2014   Chronic venous insufficiency 03/27/2014   Lumbar spondylosis 03/17/2014   Aortic atherosclerosis (HCC) 03/17/2014   Anxiety and depression 03/16/2014   Dyslipidemia, goal LDL below 70 03/16/2014   COLD (chronic obstructive lung disease) (HCC) 03/16/2014   Insomnia 03/16/2014   Esophageal reflux 03/16/2014   Bilateral hand pain 03/16/2014   DDD (degenerative disc disease), lumbar 03/16/2014    Past Surgical History:  Procedure Laterality Date   ABDOMINAL HYSTERECTOMY     Adrenal mass resection     CARPAL TUNNEL RELEASE     both wrists   CATARACT EXTRACTION, BILATERAL     CERVICAL FUSION     GALLBLADDER SURGERY     right foot surgery     THYROIDECTOMY, PARTIAL     right side removed    OB History   No obstetric history on file.      Home Medications    Prior to Admission medications   Medication Sig Start Date End Date Taking? Authorizing Provider  aspirin 81 MG tablet Take 81 mg by mouth daily.   Yes [provider]  atorvastatin (LIPITOR) 20 MG tablet TAKE 1 TABLET BY MOUTH  EVERY DAY 04/12/23  Yes Breeback, Jade L, PA-C  benzonatate (TESSALON) 200 MG capsule Take 1 capsule (200 mg total) by mouth 3 (three) times daily as needed for up to 7 days. 06/11/23 06/18/23 Yes Trevor Iha, FNP  BREZTRI AEROSPHERE 160-9-4.8 MCG/ACT AERO INHALE 2 PUFFS INTO THE LUNGS IN THE MORNING AND AT BEDTIME. 02/13/22  Yes Lupita Leash, MD  buPROPion (WELLBUTRIN XL) 150 MG 24 hr tablet TAKE 2 TABLETS BY MOUTH EVERY DAY 04/19/23  Yes Breeback, Jade L, PA-C  donepezil (ARICEPT) 10 MG tablet TAKE  1 TABLET BY MOUTH EVERY DAY 12/27/22  Yes Breeback, Jade L, PA-C  doxycycline (VIBRAMYCIN) 100 MG capsule Take 1 capsule (100 mg total) by mouth 2 (two) times daily for 7 days. 06/11/23 06/18/23 Yes Trevor Iha, FNP  DULoxetine (CYMBALTA) 60 MG capsule TAKE 1 CAPSULE BY MOUTH TWICE A DAY 04/19/23  Yes Breeback, Jade L, PA-C  fluticasone (FLONASE) 50 MCG/ACT nasal spray SPRAY 2 SPRAYS INTO EACH NOSTRIL EVERY DAY 05/20/22  Yes Delorse Lek, FNP  furosemide (LASIX) 20 MG tablet Take daily as needed for lower extremity swelling. Needs appointment for another refill 06/07/20  Yes Breeback, Jade L, PA-C  hydrOXYzine (VISTARIL) 25 MG capsule Take 1-2 tablets as needed up to three times a day for anxiety/panic. 05/23/23  Yes Breeback, Jade L, PA-C  ipratropium-albuterol (DUONEB) 0.5-2.5 (3) MG/3ML SOLN Take 3 mLs by nebulization every 4 (four) hours as needed. 08/02/22  Yes Breeback, Jade L, PA-C  levalbuterol (XOPENEX HFA) 45 MCG/ACT inhaler INHALE 1 TO 2 PUFFS BY MOUTH EVERY 6 HOURS AS NEEDED FOR WHEEZE 05/15/22  Yes Breeback, Jade L, PA-C  meloxicam (MOBIC) 15 MG tablet TAKE 1 TABLET (15 MG TOTAL) BY MOUTH DAILY. 05/07/23  Yes Breeback, Jade L, PA-C  midodrine (PROAMATINE) 10 MG tablet TAKE 1 TABLET BY MOUTH THREE TIMES A DAY 05/24/23  Yes Breeback, Jade L, PA-C  montelukast (SINGULAIR) 10 MG tablet TAKE 1 TABLET BY MOUTH EVERYDAY AT BEDTIME 06/23/22  Yes Breeback, Jade L, PA-C  OLANZapine (ZYPREXA) 10 MG tablet TAKE 1 TABLET BY MOUTH EVERYDAY AT BEDTIME 04/06/23  Yes Breeback, Jade L, PA-C  ondansetron (ZOFRAN) 8 MG tablet TAKE 1 TABLET BY MOUTH EVERY 8 HOURS AS NEEDED FOR NAUSEA AND VOMITING 05/09/23  Yes Breeback, Jade L, PA-C  oxyCODONE (ROXICODONE) 15 MG immediate release tablet Take 1 tablet (15 mg total) by mouth every 8 (eight) hours as needed for pain. 05/20/23 06/19/23 Yes Breeback, Jade L, PA-C  predniSONE (STERAPRED UNI-PAK 21 TAB) 10 MG (21) TBPK tablet Take by mouth daily. Take 6 tabs by mouth daily  for  2 days, then 5 tabs for 2 days, then 4 tabs for 2 days, then 3 tabs for 2 days, 2 tabs for 2 days, then 1 tab by mouth daily for 2 days 06/11/23  Yes Trevor Iha, FNP  primidone (MYSOLINE) 50 MG tablet Take 1 tablet (50 mg total) by mouth at bedtime. 12/26/22  Yes Breeback, Jade L, PA-C  promethazine-dextromethorphan (PROMETHAZINE-DM) 6.25-15 MG/5ML syrup Take 5 mLs by mouth 2 (two) times daily as needed for cough. 06/11/23  Yes Trevor Iha, FNP  rOPINIRole (REQUIP) 4 MG tablet TAKE 1 TABLET BY MOUTH AT BEDTIME. 06/06/23  Yes Breeback, Jade L, PA-C  tirzepatide [facility]) 10 MG/0.5ML Pen Inject 10 mg into the skin once a week. 01/11/23  Yes Breeback, Jade L, PA-C  topiramate (TOPAMAX) 100 MG tablet TAKE 1 TABLET BY MOUTH TWICE A DAY 11/09/22  Yes Metheney,  Barbarann Ehlers, MD  traZODone (DESYREL) 100 MG tablet TAKE 1 TABLET BY MOUTH EVERYDAY AT BEDTIME 05/04/23  Yes Breeback, Jade L, PA-C    Family History Family History  Problem Relation Age of Onset   Depression Mother    Heart attack Father    Diabetes Father    Uterine cancer Other    Pancreatic cancer Other    Hypertension Other        parents    Social History Social History   Tobacco Use   Smoking status: Former    Current packs/day: 0.00    Types: Cigarettes    Quit date: 02/12/2014    Years since quitting: 9.3   Smokeless tobacco: Never  Vaping Use   Vaping status: Never Used  Substance Use Topics   Alcohol use: No    Alcohol/week: 0.0 standard drinks of alcohol   Drug use: No     Allergies   Molnupiravir, Gabapentin, and Sulfa antibiotics   Review of Systems Review of Systems  HENT:  Positive for congestion.   Respiratory:  Positive for cough.   All other systems reviewed and are negative.    Physical Exam Triage Vital Signs ED Triage Vitals  Encounter Vitals Group     BP 06/11/23 1023 (!) 91/50     Systolic BP Percentile --      Diastolic BP Percentile --      Pulse Rate 06/11/23 1023 88     Resp  06/11/23 1023 18     Temp 06/11/23 1023 98.6 F (37 C)     Temp Source 06/11/23 1023 Oral     SpO2 06/11/23 1023 95 %     Weight 06/11/23 1025 170 lb (77.1 kg)     Height 06/11/23 1025 5\' 4"  (1.626 m)     Head Circumference --      Peak Flow --      Pain Score 06/11/23 1025 0     Pain Loc --      Pain Education --      Exclude from Growth Chart --    No data found.  Updated Vital Signs BP (!) 91/50 (BP Location: Right Arm)   Pulse 88   Temp 98.6 F (37 C) (Oral)   Resp 18   Ht 5\' 4"  (1.626 m)   Wt 170 lb (77.1 kg)   SpO2 95%   BMI 29.18 kg/m      Physical Exam Vitals and nursing note reviewed.  Constitutional:      Appearance: Normal appearance. She is obese.  HENT:     Head: Normocephalic and atraumatic.     Right Ear: Tympanic membrane, ear canal and external ear normal.     Left Ear: Tympanic membrane, ear canal and external ear normal.     Nose: Nose normal.     Mouth/Throat:     Mouth: Mucous membranes are dry.     Pharynx: Oropharynx is clear.  Eyes:     Extraocular Movements: Extraocular movements intact.     Conjunctiva/sclera: Conjunctivae normal.     Pupils: Pupils are equal, round, and reactive to light.  Cardiovascular:     Rate and Rhythm: Normal rate and regular rhythm.     Pulses: Normal pulses.     Heart sounds: Normal heart sounds.  Pulmonary:     Effort: Pulmonary effort is normal.     Breath sounds: Normal breath sounds. No wheezing, rhonchi or rales.     Comments: Infrequent nonproductive cough on  exam Musculoskeletal:        General: Normal range of motion.     Cervical back: Normal range of motion and neck supple.  Skin:    General: Skin is warm and dry.  Neurological:     General: No focal deficit present.     Mental Status: She is alert and oriented to person, place, and time. Mental status is at baseline.      UC Treatments / Results  Labs (all labs ordered are listed, but only abnormal results are displayed) Labs Reviewed  - No data to display  EKG   Radiology No results found.  Procedures Procedures (including critical care time)  Medications Ordered in UC Medications - No data to display  Initial Impression / Assessment and Plan / UC Course  I have reviewed the triage vital signs and the nursing notes.  Pertinent labs & imaging results that were available during my care of the patient were reviewed by me and considered in my medical decision making (see chart for details).     MDM: 1.  Acute upper respiratory infection-Rx'd doxycycline 100 mg capsule: Take 1 capsule twice daily x 7 days; 2.  Cough, unspecified type-Rx'd Sterapred Unipak (tapering from 60 mg to 10 mg) Rx Tessalon 200 mg capsule: Take 1 capsule 3 times daily, as needed for cough, Rx'd Promethazine DM 6.25-15 Mg/5 mL syrup: Take 5 mL 2 times daily as needed for cough. Advised patient to take medications as directed with food to completion.  Advised patient to take prednisone with first dose of doxycycline until complete.  Advised may take Tessalon capsules daily or as needed for cough.  Advised may take Promethazine DM at night prior to sleep due to sedative effects.  Encouraged to increase daily water intake to 64 ounces per day while taking these medications.  Advised if symptoms worsen and/or unresolved please follow-up PCP or here for further evaluation.  Patient discharged home, hemodynamically stable. Final Clinical Impressions(s) / UC Diagnoses   Final diagnoses:  Cough, unspecified type  Acute upper respiratory infection     Discharge Instructions      Advised patient to take medications as directed with food to completion.  Advised patient to take prednisone with first dose of doxycycline until complete.  Advised may take Tessalon capsules daily or as needed for cough.  Advised may take Promethazine DM at night prior to sleep due to sedative effects.  Encouraged to increase daily water intake to 64 ounces per day while taking  these medications.  Advised if symptoms worsen and/or unresolved please follow-up PCP or here for further evaluation.     ED Prescriptions     Medication Sig Dispense Auth. Provider   doxycycline (VIBRAMYCIN) 100 MG capsule Take 1 capsule (100 mg total) by mouth 2 (two) times daily for 7 days. 14 capsule Trevor Iha, FNP   promethazine-dextromethorphan (PROMETHAZINE-DM) 6.25-15 MG/5ML syrup Take 5 mLs by mouth 2 (two) times daily as needed for cough. 118 mL Trevor Iha, FNP   benzonatate (TESSALON) 200 MG capsule Take 1 capsule (200 mg total) by mouth 3 (three) times daily as needed for up to 7 days. 40 capsule Trevor Iha, FNP   predniSONE (STERAPRED UNI-PAK 21 TAB) 10 MG (21) TBPK tablet Take by mouth daily. Take 6 tabs by mouth daily  for 2 days, then 5 tabs for 2 days, then 4 tabs for 2 days, then 3 tabs for 2 days, 2 tabs for 2 days, then 1 tab by mouth  daily for 2 days 42 tablet Trevor Iha, FNP      I have reviewed the PDMP during this encounter.   Trevor Iha, FNP 06/11/23 1111

## 2023-06-13 NOTE — Telephone Encounter (Signed)
 Prior auth for: [facility]  Determination: APPROVED Auth #: [id number]  Valid from: 04/25/23 - 05/22/24

## 2023-06-14 ENCOUNTER — Encounter: Payer: Self-pay | Admitting: Physician Assistant

## 2023-06-19 DIAGNOSIS — H53411 Scotoma involving central area, right eye: Secondary | ICD-10-CM | POA: Diagnosis not present

## 2023-06-20 ENCOUNTER — Ambulatory Visit (INDEPENDENT_AMBULATORY_CARE_PROVIDER_SITE_OTHER): Payer: Medicare Other | Admitting: Physician Assistant

## 2023-06-20 ENCOUNTER — Encounter: Payer: Self-pay | Admitting: Physician Assistant

## 2023-06-20 VITALS — BP 111/66 | HR 78 | Ht 64.0 in | Wt 177.5 lb

## 2023-06-20 DIAGNOSIS — G894 Chronic pain syndrome: Secondary | ICD-10-CM

## 2023-06-20 DIAGNOSIS — J9611 Chronic respiratory failure with hypoxia: Secondary | ICD-10-CM

## 2023-06-20 DIAGNOSIS — J411 Mucopurulent chronic bronchitis: Secondary | ICD-10-CM

## 2023-06-20 DIAGNOSIS — E1142 Type 2 diabetes mellitus with diabetic polyneuropathy: Secondary | ICD-10-CM

## 2023-06-20 DIAGNOSIS — Z7985 Long-term (current) use of injectable non-insulin antidiabetic drugs: Secondary | ICD-10-CM

## 2023-06-20 DIAGNOSIS — R52 Pain, unspecified: Secondary | ICD-10-CM

## 2023-06-20 NOTE — Progress Notes (Signed)
 Established Patient Office Visit  Subjective   Patient ID: Sarah Wright, female    DOB: [birth year]  Age: 78 y.o. MRN: [id number]  Chief Complaint  Patient presents with   Medical Management of Chronic Issues    T2DM last A1C 11/27    HPI Patient is a 78 yo female with T2DM who presents for diabetes follow up.   She denies any known hypoglycemic events. She is not checking her sugars at home. She is not active. She does not have appetite. She denies any CP, palpitations, headaches or vision changes.   She needs pain medication refilled.   Her breathing has improved today.  She has been on steroids for bronchitis.   . Active Ambulatory Problems    Diagnosis Date Noted   Anxiety and depression 03/16/2014   Dyslipidemia, goal LDL below 70 03/16/2014   COLD (chronic obstructive lung disease) (HCC) 03/16/2014   Insomnia 03/16/2014   Esophageal reflux 03/16/2014   Bilateral hand pain 03/16/2014   DDD (degenerative disc disease), lumbar 03/16/2014   Lumbar spondylosis 03/17/2014   Aortic atherosclerosis (HCC) 03/17/2014   Chronic venous insufficiency 03/27/2014   H/O colonoscopy 04/02/2014   Thyroid disease 04/02/2014   Cataract 04/02/2014   Type 2 diabetes mellitus (HCC) 04/02/2014   Primary osteoarthritis of both knees 07/21/2014   Class 3 severe obesity due to excess calories with serious comorbidity and body mass index (BMI) of 40.0 to 44.9 in adult [facility]) 10/07/2014   Vertigo 01/04/2015   Sensorineural hearing loss of both ears 01/26/2015   Thyroid nodule 06/22/2015   Memory loss 07/29/2015   Psoriasis 09/23/2015   Right shoulder injury 01/27/2016   Balance problem 08/13/2016   Acute pain of left shoulder 08/13/2016   Numbness around mouth 09/08/2016   DDD (degenerative disc disease), cervical 12/29/2016   Microalbuminuria 01/30/2017   Chronic pain syndrome 05/06/2017   Skin abrasion 08/01/2017   Leukocytes in urine 08/01/2017   Dysuria 08/01/2017   Panic attack  08/01/2017   RLS (restless legs syndrome) 08/12/2018   COPD exacerbation (HCC) 08/14/2019   Right ankle pain 01/20/2020   Severe episode of recurrent major depressive disorder, without psychotic features (HCC) 08/20/2020   Excessive crying 08/20/2020   Orthostatic hypotension 03/22/2021   Bilateral impacted cerumen 06/14/2021   Callus of toe 07/15/2021   Pain management 07/26/2021   SOB (shortness of breath) on exertion 10/26/2021   Hypoxia 10/26/2021   Lung crackles 10/26/2021   Left adrenal mass (HCC) 11/08/2021   Fatty liver disease, nonalcoholic 11/11/2021   Fall 12/19/2021   Bilateral primary osteoarthritis of knee 12/28/2021   Chronic respiratory failure with hypoxia (HCC) 02/20/2022   GAD (generalized anxiety disorder) 02/20/2022   Itching 10/16/2022   Incontinence of feces 12/15/2022   Iron deficiency anemia secondary to inadequate dietary iron intake 03/26/2023   Hypotension 03/26/2023   Resolved Ambulatory Problems    Diagnosis Date Noted   No Resolved Ambulatory Problems   Past Medical History:  Diagnosis Date   Diabetes mellitus without complication (HCC)    Hyperlipidemia      Review of Systems  All other systems reviewed and are negative.     Objective:     BP 111/66 (BP Location: Left Arm, Patient Position: Sitting, Cuff Size: Large)   Pulse 78   Ht 5\' 4"  (1.626 m)   Wt 177 lb 8 oz (80.5 kg)   SpO2 97%   BMI 30.47 kg/m  BP Readings from Last 3 Encounters:  06/20/23 111/66  06/11/23 (!) 91/50  03/21/23 120/62   Wt Readings from Last 3 Encounters:  06/20/23 177 lb 8 oz (80.5 kg)  06/11/23 170 lb (77.1 kg)  03/21/23 177 lb (80.3 kg)   SpO2 Readings from Last 3 Encounters:  06/20/23 97%  06/11/23 95%  03/21/23 100%    Physical Exam Constitutional:      Appearance: Normal appearance.  HENT:     Head: Normocephalic and atraumatic.  Eyes:     Extraocular Movements: Extraocular movements intact.  Cardiovascular:     Rate and Rhythm:  Normal rate and regular rhythm.     Pulses: Normal pulses.     Heart sounds: Normal heart sounds.  Pulmonary:     Effort: Pulmonary effort is normal.     Breath sounds: Normal breath sounds.     Comments: 3L of O2.  Musculoskeletal:     Cervical back: Normal range of motion.     Right lower leg: No edema.     Left lower leg: No edema.     Comments: Patient uses a walker  Skin:    General: Skin is warm.  Neurological:     Mental Status: She is alert.     .. Lab Results  Component Value Date   HGBA1C 4.7 (L) 03/21/2023         Assessment & Plan:  [name] KitchenMarland KitchenLuanne was seen today for medical management of chronic issues.  Diagnoses and all orders for this visit:  Type 2 diabetes mellitus with diabetic polyneuropathy, without long-term current use of insulin (HCC) -     tirzepatide (MOUNJARO) 10 MG/0.5ML Pen; Inject 10 mg into the skin once a week.  Pain management -     oxyCODONE (ROXICODONE) 15 MG immediate release tablet; Take 1 tablet (15 mg total) by mouth every 8 (eight) hours as needed for pain. -     oxyCODONE (ROXICODONE) 15 MG immediate release tablet; Take 1 tablet (15 mg total) by mouth every 8 (eight) hours as needed for pain. -     oxyCODONE (ROXICODONE) 15 MG immediate release tablet; Take 1 tablet (15 mg total) by mouth every 8 (eight) hours as needed for pain.  Chronic pain syndrome -     oxyCODONE (ROXICODONE) 15 MG immediate release tablet; Take 1 tablet (15 mg total) by mouth every 8 (eight) hours as needed for pain. -     oxyCODONE (ROXICODONE) 15 MG immediate release tablet; Take 1 tablet (15 mg total) by mouth every 8 (eight) hours as needed for pain. -     oxyCODONE (ROXICODONE) 15 MG immediate release tablet; Take 1 tablet (15 mg total) by mouth every 8 (eight) hours as needed for pain.  Chronic respiratory failure with hypoxia (HCC)  Mucopurulent chronic bronchitis (HCC)    - A1C today: 5.0% - to goal!   - Patient educated on importance of protein and  eating smaller, more frequent meals to prevent hypoglycemic episodes.   -refilled mounjaro  -BP to goal  - on statin  -need eye exam  -need microalbumin but could not given urine today  -vaccines UTD  - Pain contract UTD/UDS UTD .[name] KitchenPDMP reviewed during this encounter.   - Refilled pain medications   - Labs UTD - F/u in 3 months for A1C and recheck protein - on 3L O2 24/7; on Nicola Girt, PA-C

## 2023-06-22 ENCOUNTER — Encounter: Payer: Self-pay | Admitting: Physician Assistant

## 2023-06-22 ENCOUNTER — Ambulatory Visit: Payer: Medicare Other | Admitting: Physician Assistant

## 2023-06-22 LAB — POCT GLYCOSYLATED HEMOGLOBIN (HGB A1C): Hemoglobin A1C: 5 % (ref 4.0–5.6)

## 2023-06-22 MED ORDER — OXYCODONE HCL 15 MG PO TABS: 15.0000 mg | ORAL_TABLET | Freq: Three times a day (TID) | ORAL | 0 refills | Status: AC | PRN

## 2023-06-22 MED ORDER — MOUNJARO 10 MG/0.5ML ~~LOC~~ SOAJ: 10.0000 mg | SUBCUTANEOUS | 1 refills | Status: DC

## 2023-06-22 MED ORDER — OXYCODONE HCL 15 MG PO TABS: 15.0000 mg | ORAL_TABLET | Freq: Three times a day (TID) | ORAL | 0 refills | Status: DC | PRN

## 2023-06-22 NOTE — Addendum Note (Signed)
 Addended byRoselyn Reef on: 06/22/2023 01:05 PM   Modules accepted: Orders

## 2023-06-29 DIAGNOSIS — H53411 Scotoma involving central area, right eye: Secondary | ICD-10-CM | POA: Diagnosis not present

## 2023-07-09 ENCOUNTER — Encounter: Payer: Self-pay | Admitting: Sports Medicine

## 2023-07-09 ENCOUNTER — Ambulatory Visit (INDEPENDENT_AMBULATORY_CARE_PROVIDER_SITE_OTHER): Admitting: Sports Medicine

## 2023-07-09 ENCOUNTER — Other Ambulatory Visit (INDEPENDENT_AMBULATORY_CARE_PROVIDER_SITE_OTHER): Payer: Self-pay

## 2023-07-09 DIAGNOSIS — M170 Bilateral primary osteoarthritis of knee: Secondary | ICD-10-CM | POA: Diagnosis not present

## 2023-07-09 MED ORDER — HYALURONAN 30 MG/2ML IX SOSY
30.0000 mg | PREFILLED_SYRINGE | Freq: Once | INTRA_ARTICULAR | Status: AC
  Administered 2023-07-09: 30 mg via INTRA_ARTICULAR

## 2023-07-09 NOTE — Assessment & Plan Note (Signed)
 This very pleasant 78 year old female returns, she has bilateral knee osteoarthritis, she did see Dr. Marlene Bast with orthopedic surgery who deemed her not a good operative candidate. She is status post adrenalectomy for benign etiology. Meloxicam not improving, I injected both of her knees at the end of January of this year, unfortunately she did not get sufficient improvement so we did start Orthovisc No. 1 of 4 today both knees. She will return in a week for shot #2 of 4 both knees Orthovisc. We did discuss geniculate artery embolization should the Orthovisc fail.

## 2023-07-09 NOTE — Addendum Note (Signed)
 Addended by: Samule Dry on: 07/09/2023 11:08 AM   Modules accepted: Orders

## 2023-07-09 NOTE — Progress Notes (Signed)
    Procedures performed today:    Procedure: Real-time Ultrasound Guided injection of the left knee Device: Samsung HS60  Verbal informed consent obtained.  Time-out conducted.  Noted no overlying erythema, induration, or other signs of local infection.  Skin prepped in a sterile fashion.  Local anesthesia: Topical Ethyl chloride.  With sterile technique and under real time ultrasound guidance: Trace effusion noted, 30 mg/2 mL of OrthoVisc (sodium hyaluronate) in a prefilled syringe was injected easily into the knee through a 22-gauge needle. Completed without difficulty  Advised to call if fevers/chills, erythema, induration, drainage, or persistent bleeding.  Images permanently stored and available for review in PACS.  Impression: Technically successful ultrasound guided injection.   Procedure: Real-time Ultrasound Guided injection of the right knee Device: Samsung HS60  Verbal informed consent obtained.  Time-out conducted.  Noted no overlying erythema, induration, or other signs of local infection.  Skin prepped in a sterile fashion.  Local anesthesia: Topical Ethyl chloride.  With sterile technique and under real time ultrasound guidance: Trace effusion noted, 30 mg/2 mL of OrthoVisc (sodium hyaluronate) in a prefilled syringe was injected easily into the knee through a 22-gauge needle. Completed without difficulty  Advised to call if fevers/chills, erythema, induration, drainage, or persistent bleeding.  Images permanently stored and available for review in PACS.  Impression: Technically successful ultrasound guided injection.  Independent interpretation of notes and tests performed by another provider:   None.  Brief History, Exam, Impression, and Recommendations:    Primary osteoarthritis of both knees This very pleasant 78 year old female returns, she has bilateral knee osteoarthritis, she did see Dr. Marlene Bast with orthopedic surgery who deemed her not a good operative  candidate. She is status post adrenalectomy for benign etiology. Meloxicam not improving, I injected both of her knees at the end of January of this year, unfortunately she did not get sufficient improvement so we did start Orthovisc No. 1 of 4 today both knees. She will return in a week for shot #2 of 4 both knees Orthovisc. We did discuss geniculate artery embolization should the Orthovisc fail.    ____________________________________________ Ihor Austin. Benjamin Stain, M.D., ABFM., CAQSM., AME. Primary Care and Sports Medicine [place] [facility]  Adjunct Professor of Family Medicine  [place] [employer] [facility] [employer]  Restaurant manager, fast food

## 2023-07-17 ENCOUNTER — Other Ambulatory Visit (INDEPENDENT_AMBULATORY_CARE_PROVIDER_SITE_OTHER): Payer: Self-pay

## 2023-07-17 ENCOUNTER — Ambulatory Visit (INDEPENDENT_AMBULATORY_CARE_PROVIDER_SITE_OTHER): Admitting: Sports Medicine

## 2023-07-17 DIAGNOSIS — M170 Bilateral primary osteoarthritis of knee: Secondary | ICD-10-CM | POA: Diagnosis not present

## 2023-07-17 MED ORDER — HYALURONAN 30 MG/2ML IX SOSY
30.0000 mg | PREFILLED_SYRINGE | Freq: Once | INTRA_ARTICULAR | Status: AC
  Administered 2023-07-17: 30 mg via INTRA_ARTICULAR

## 2023-07-17 NOTE — Assessment & Plan Note (Signed)
 This very pleasant 78 year old female returns, known bilateral knee osteoarthritis, she did see Dr. Marlene Bast with orthopedic surgery who deemed her not a good operative candidate, she is status post adrenalectomy for benign etiology. She did not respond to conservative treatment, today we did Orthovisc No. 2 of 4 both knees, return in 1 week for #3 of 4 both knees, we will consider geniculate artery embolization should the Orthovisc fail.

## 2023-07-17 NOTE — Addendum Note (Signed)
 Addended by: Samule Dry on: 07/17/2023 12:06 PM   Modules accepted: Orders

## 2023-07-17 NOTE — Progress Notes (Signed)
    Procedures performed today:    Procedure: Real-time Ultrasound Guided injection of the left knee Device: Samsung HS60  Verbal informed consent obtained.  Time-out conducted.  Noted no overlying erythema, induration, or other signs of local infection.  Skin prepped in a sterile fashion.  Local anesthesia: Topical Ethyl chloride.  With sterile technique and under real time ultrasound guidance: Trace effusion noted, 30 mg/2 mL of OrthoVisc (sodium hyaluronate) in a prefilled syringe was injected easily into the knee through a 22-gauge needle. Completed without difficulty  Advised to call if fevers/chills, erythema, induration, drainage, or persistent bleeding.  Images permanently stored and available for review in PACS.  Impression: Technically successful ultrasound guided injection.   Procedure: Real-time Ultrasound Guided injection of the right knee Device: Samsung HS60  Verbal informed consent obtained.  Time-out conducted.  Noted no overlying erythema, induration, or other signs of local infection.  Skin prepped in a sterile fashion.  Local anesthesia: Topical Ethyl chloride.  With sterile technique and under real time ultrasound guidance: Trace effusion noted, 30 mg/2 mL of OrthoVisc (sodium hyaluronate) in a prefilled syringe was injected easily into the knee through a 22-gauge needle. Completed without difficulty  Advised to call if fevers/chills, erythema, induration, drainage, or persistent bleeding.  Images permanently stored and available for review in PACS.  Impression: Technically successful ultrasound guided injection.  Independent interpretation of notes and tests performed by another provider:   None.  Brief History, Exam, Impression, and Recommendations:    Primary osteoarthritis of both knees This very pleasant 78 year old female returns, known bilateral knee osteoarthritis, she did see Dr. Marlene Bast with orthopedic surgery who deemed her not a good operative  candidate, she is status post adrenalectomy for benign etiology. She did not respond to conservative treatment, today we did Orthovisc No. 2 of 4 both knees, return in 1 week for #3 of 4 both knees, we will consider geniculate artery embolization should the Orthovisc fail.    ____________________________________________ Sarah Wright. Sarah Wright, M.D., ABFM., CAQSM., AME. Primary Care and Sports Medicine [place] [facility]  Adjunct Professor of Family Medicine  [place] [employer] [facility] [employer]  Restaurant manager, fast food

## 2023-07-18 ENCOUNTER — Encounter: Payer: Self-pay | Admitting: Physician Assistant

## 2023-07-18 ENCOUNTER — Other Ambulatory Visit: Payer: Self-pay | Admitting: Physician Assistant

## 2023-07-24 ENCOUNTER — Ambulatory Visit (INDEPENDENT_AMBULATORY_CARE_PROVIDER_SITE_OTHER): Admitting: Sports Medicine

## 2023-07-24 ENCOUNTER — Other Ambulatory Visit (INDEPENDENT_AMBULATORY_CARE_PROVIDER_SITE_OTHER): Payer: Self-pay

## 2023-07-24 DIAGNOSIS — M170 Bilateral primary osteoarthritis of knee: Secondary | ICD-10-CM | POA: Diagnosis not present

## 2023-07-24 MED ORDER — HYALURONAN 30 MG/2ML IX SOSY
30.0000 mg | PREFILLED_SYRINGE | Freq: Once | INTRA_ARTICULAR | Status: AC
  Administered 2023-07-24: 30 mg via INTRA_ARTICULAR

## 2023-07-24 NOTE — Progress Notes (Signed)
    Procedures performed today:    Procedure: Real-time Ultrasound Guided injection of the left knee Device: Samsung HS60  Verbal informed consent obtained.  Time-out conducted.  Noted no overlying erythema, induration, or other signs of local infection.  Skin prepped in a sterile fashion.  Local anesthesia: Topical Ethyl chloride.  With sterile technique and under real time ultrasound guidance: Trace effusion noted, 30 mg/2 mL of OrthoVisc (sodium hyaluronate) in a prefilled syringe was injected easily into the knee through a 22-gauge needle. Completed without difficulty  Advised to call if fevers/chills, erythema, induration, drainage, or persistent bleeding.  Images permanently stored and available for review in PACS.  Impression: Technically successful ultrasound guided injection.   Procedure: Real-time Ultrasound Guided injection of the right knee Device: Samsung HS60  Verbal informed consent obtained.  Time-out conducted.  Noted no overlying erythema, induration, or other signs of local infection.  Skin prepped in a sterile fashion.  Local anesthesia: Topical Ethyl chloride.  With sterile technique and under real time ultrasound guidance: Trace effusion noted, 30 mg/2 mL of OrthoVisc (sodium hyaluronate) in a prefilled syringe was injected easily into the knee through a 22-gauge needle. Completed without difficulty  Advised to call if fevers/chills, erythema, induration, drainage, or persistent bleeding.  Images permanently stored and available for review in PACS.  Impression: Technically successful ultrasound guided injection.  Independent interpretation of notes and tests performed by another provider:   None.  Brief History, Exam, Impression, and Recommendations:    Primary osteoarthritis of both knees Saw Dr. Marlene Bast with orthopedic surgery, not a good operative candidate, she is post adrenalectomy for benign etiology, today we did Orthovisc 3 of 4 both knees, return in 1  week for #4 of 4, we will consider geniculate artery embolization should Orthovisc fail.    ____________________________________________ Ihor Austin. Benjamin Stain, M.D., ABFM., CAQSM., AME. Primary Care and Sports Medicine [place] [facility]  Adjunct Professor of Family Medicine  [place] [employer] [facility] [employer]  Restaurant manager, fast food

## 2023-07-24 NOTE — Addendum Note (Signed)
 Addended by: Samule Dry on: 07/24/2023 11:07 AM   Modules accepted: Orders

## 2023-07-24 NOTE — Assessment & Plan Note (Signed)
 Saw Dr. Marlene Bast with orthopedic surgery, not a good operative candidate, she is post adrenalectomy for benign etiology, today we did Orthovisc 3 of 4 both knees, return in 1 week for #4 of 4, we will consider geniculate artery embolization should Orthovisc fail.

## 2023-07-31 ENCOUNTER — Encounter: Payer: Self-pay | Admitting: Sports Medicine

## 2023-07-31 ENCOUNTER — Ambulatory Visit (INDEPENDENT_AMBULATORY_CARE_PROVIDER_SITE_OTHER): Admitting: Sports Medicine

## 2023-07-31 ENCOUNTER — Other Ambulatory Visit (INDEPENDENT_AMBULATORY_CARE_PROVIDER_SITE_OTHER)

## 2023-07-31 DIAGNOSIS — M170 Bilateral primary osteoarthritis of knee: Secondary | ICD-10-CM

## 2023-07-31 MED ORDER — HYALURONAN 30 MG/2ML IX SOSY
30.0000 mg | PREFILLED_SYRINGE | Freq: Once | INTRA_ARTICULAR | Status: AC
  Administered 2023-07-31: 30 mg via INTRA_ARTICULAR

## 2023-07-31 NOTE — Assessment & Plan Note (Signed)
 Orthovisc 4 of 4 both knees. We can always consider geniculate artery embolization should Orthovisc fail. She is starting to improve, we can do a follow-up in 6 weeks only if needed. Deemed not a good operative candidate for arthroplasty, she is post adrenalectomy for benign etiology.

## 2023-07-31 NOTE — Progress Notes (Signed)
    Procedures performed today:    Procedure: Real-time Ultrasound Guided injection of the left knee Device: Samsung HS60  Verbal informed consent obtained.  Time-out conducted.  Noted no overlying erythema, induration, or other signs of local infection.  Skin prepped in a sterile fashion.  Local anesthesia: Topical Ethyl chloride.  With sterile technique and under real time ultrasound guidance: Trace effusion noted, 30 mg/2 mL of OrthoVisc (sodium hyaluronate) in a prefilled syringe was injected easily into the knee through a 22-gauge needle. Completed without difficulty  Advised to call if fevers/chills, erythema, induration, drainage, or persistent bleeding.  Images permanently stored and available for review in PACS.  Impression: Technically successful ultrasound guided injection.   Procedure: Real-time Ultrasound Guided injection of the right knee Device: Samsung HS60  Verbal informed consent obtained.  Time-out conducted.  Noted no overlying erythema, induration, or other signs of local infection.  Skin prepped in a sterile fashion.  Local anesthesia: Topical Ethyl chloride.  With sterile technique and under real time ultrasound guidance: Trace effusion noted, 30 mg/2 mL of OrthoVisc (sodium hyaluronate) in a prefilled syringe was injected easily into the knee through a 22-gauge needle. Completed without difficulty  Advised to call if fevers/chills, erythema, induration, drainage, or persistent bleeding.  Images permanently stored and available for review in PACS.  Impression: Technically successful ultrasound guided injection.  Independent interpretation of notes and tests performed by another provider:   None.  Brief History, Exam, Impression, and Recommendations:    Primary osteoarthritis of both knees Orthovisc 4 of 4 both knees. We can always consider geniculate artery embolization should Orthovisc fail. She is starting to improve, we can do a follow-up in 6 weeks  only if needed. Deemed not a good operative candidate for arthroplasty, she is post adrenalectomy for benign etiology.    ____________________________________________ Ihor Austin. Benjamin Stain, M.D., ABFM., CAQSM., AME. Primary Care and Sports Medicine [place] [facility]  Adjunct Professor of Family Medicine  [place] [employer] [facility] [employer]  Restaurant manager, fast food

## 2023-08-01 DIAGNOSIS — H353134 Nonexudative age-related macular degeneration, bilateral, advanced atrophic with subfoveal involvement: Secondary | ICD-10-CM | POA: Diagnosis not present

## 2023-08-03 ENCOUNTER — Encounter: Payer: Self-pay | Admitting: Physician Assistant

## 2023-08-03 DIAGNOSIS — I951 Orthostatic hypotension: Secondary | ICD-10-CM

## 2023-08-07 MED ORDER — PRAMIPEXOLE DIHYDROCHLORIDE 0.125 MG PO TABS: ORAL_TABLET | ORAL | 0 refills | Status: DC

## 2023-08-09 MED ORDER — MIDODRINE HCL 10 MG PO TABS: 10.0000 mg | ORAL_TABLET | Freq: Three times a day (TID) | ORAL | 1 refills | Status: DC

## 2023-08-09 NOTE — Addendum Note (Signed)
 Addended by: Araceli Knight on: 08/09/2023 10:18 AM   Modules accepted: Orders

## 2023-08-10 ENCOUNTER — Other Ambulatory Visit: Payer: Self-pay | Admitting: Physician Assistant

## 2023-08-10 DIAGNOSIS — M25512 Pain in left shoulder: Secondary | ICD-10-CM

## 2023-08-15 ENCOUNTER — Other Ambulatory Visit: Payer: Self-pay | Admitting: Physician Assistant

## 2023-08-15 DIAGNOSIS — G2581 Restless legs syndrome: Secondary | ICD-10-CM

## 2023-08-16 ENCOUNTER — Encounter: Payer: Self-pay | Admitting: Physician Assistant

## 2023-08-20 MED ORDER — PRAMIPEXOLE DIHYDROCHLORIDE 0.5 MG PO TABS: ORAL_TABLET | ORAL | 0 refills | Status: DC

## 2023-08-21 DIAGNOSIS — B351 Tinea unguium: Secondary | ICD-10-CM | POA: Diagnosis not present

## 2023-08-21 DIAGNOSIS — M2041 Other hammer toe(s) (acquired), right foot: Secondary | ICD-10-CM | POA: Diagnosis not present

## 2023-08-21 DIAGNOSIS — L851 Acquired keratosis [keratoderma] palmaris et plantaris: Secondary | ICD-10-CM | POA: Diagnosis not present

## 2023-08-21 DIAGNOSIS — I739 Peripheral vascular disease, unspecified: Secondary | ICD-10-CM | POA: Diagnosis not present

## 2023-08-21 DIAGNOSIS — M2042 Other hammer toe(s) (acquired), left foot: Secondary | ICD-10-CM | POA: Diagnosis not present

## 2023-08-21 DIAGNOSIS — M216X1 Other acquired deformities of right foot: Secondary | ICD-10-CM | POA: Diagnosis not present

## 2023-08-21 DIAGNOSIS — I872 Venous insufficiency (chronic) (peripheral): Secondary | ICD-10-CM | POA: Diagnosis not present

## 2023-08-21 DIAGNOSIS — L84 Corns and callosities: Secondary | ICD-10-CM | POA: Diagnosis not present

## 2023-08-22 ENCOUNTER — Other Ambulatory Visit: Payer: Self-pay | Admitting: Physician Assistant

## 2023-09-03 ENCOUNTER — Other Ambulatory Visit: Payer: Self-pay | Admitting: Physician Assistant

## 2023-09-04 ENCOUNTER — Encounter: Payer: Self-pay | Admitting: Physician Assistant

## 2023-09-04 NOTE — Telephone Encounter (Signed)
 Primidone    Shows last filled 06/15/2023 for qty #90 [facility] [place]  [facility] [place] and [facility] agreed that patient should still have one refill. They are processing this medication and patient can pick it up today.

## 2023-09-05 ENCOUNTER — Encounter: Payer: Self-pay | Admitting: Physician Assistant

## 2023-09-05 DIAGNOSIS — R52 Pain, unspecified: Secondary | ICD-10-CM

## 2023-09-05 DIAGNOSIS — G894 Chronic pain syndrome: Secondary | ICD-10-CM

## 2023-09-05 MED ORDER — OXYCODONE HCL 15 MG PO TABS: 15.0000 mg | ORAL_TABLET | Freq: Three times a day (TID) | ORAL | 0 refills | Status: DC | PRN

## 2023-09-05 NOTE — Telephone Encounter (Signed)
 Will fill but needs pain management appt with me within the month.

## 2023-09-05 NOTE — Telephone Encounter (Signed)
 Requesting a rx rf oxycodone  15mg  IR Last written 08/21/2023 Last OV  Dr. Sandy Crumb  07/31/2023 Sandy Crumb 06/20/2023 Upcoming appt  09/11/2023 Dr. Sandy Crumb 01/08/2024 AWV

## 2023-09-06 NOTE — Telephone Encounter (Signed)
 Attempetd call to patient. Left a detailed voice mail message on primary phone # allowed on DPR  -requesting a return call to scheduled a pain management follow up appt . - left office contact information as well.

## 2023-09-11 ENCOUNTER — Ambulatory Visit: Admitting: Sports Medicine

## 2023-09-13 ENCOUNTER — Other Ambulatory Visit: Payer: Self-pay | Admitting: Physician Assistant

## 2023-09-13 DIAGNOSIS — F332 Major depressive disorder, recurrent severe without psychotic features: Secondary | ICD-10-CM

## 2023-09-14 NOTE — Telephone Encounter (Signed)
 Left detailed voice mail message on patient's daughter number Evertt Hoe) allowed on DPR.

## 2023-09-18 ENCOUNTER — Other Ambulatory Visit: Payer: Self-pay | Admitting: Physician Assistant

## 2023-09-18 ENCOUNTER — Ambulatory Visit: Payer: Medicare Other | Admitting: Physician Assistant

## 2023-09-18 ENCOUNTER — Encounter: Payer: Self-pay | Admitting: Physician Assistant

## 2023-09-18 NOTE — Telephone Encounter (Signed)
 Patient has upcoming appt schld for September 24, 2023.

## 2023-09-19 MED ORDER — PRAMIPEXOLE DIHYDROCHLORIDE 0.5 MG PO TABS: ORAL_TABLET | ORAL | 0 refills | Status: DC

## 2023-09-24 ENCOUNTER — Encounter: Payer: Self-pay | Admitting: Physician Assistant

## 2023-09-24 ENCOUNTER — Ambulatory Visit (INDEPENDENT_AMBULATORY_CARE_PROVIDER_SITE_OTHER): Admitting: Physician Assistant

## 2023-09-24 VITALS — BP 107/82 | HR 70 | Ht 64.0 in | Wt 180.0 lb

## 2023-09-24 DIAGNOSIS — G2581 Restless legs syndrome: Secondary | ICD-10-CM

## 2023-09-24 DIAGNOSIS — F410 Panic disorder [episodic paroxysmal anxiety] without agoraphobia: Secondary | ICD-10-CM | POA: Diagnosis not present

## 2023-09-24 DIAGNOSIS — R110 Nausea: Secondary | ICD-10-CM

## 2023-09-24 DIAGNOSIS — Z23 Encounter for immunization: Secondary | ICD-10-CM | POA: Diagnosis not present

## 2023-09-24 DIAGNOSIS — Z7985 Long-term (current) use of injectable non-insulin antidiabetic drugs: Secondary | ICD-10-CM | POA: Diagnosis not present

## 2023-09-24 DIAGNOSIS — J9611 Chronic respiratory failure with hypoxia: Secondary | ICD-10-CM | POA: Diagnosis not present

## 2023-09-24 DIAGNOSIS — I951 Orthostatic hypotension: Secondary | ICD-10-CM

## 2023-09-24 DIAGNOSIS — E1142 Type 2 diabetes mellitus with diabetic polyneuropathy: Secondary | ICD-10-CM | POA: Diagnosis not present

## 2023-09-24 DIAGNOSIS — R52 Pain, unspecified: Secondary | ICD-10-CM | POA: Diagnosis not present

## 2023-09-24 DIAGNOSIS — M170 Bilateral primary osteoarthritis of knee: Secondary | ICD-10-CM | POA: Diagnosis not present

## 2023-09-24 DIAGNOSIS — G894 Chronic pain syndrome: Secondary | ICD-10-CM

## 2023-09-24 LAB — POCT GLYCOSYLATED HEMOGLOBIN (HGB A1C): Hemoglobin A1C: 4.7 % (ref 4.0–5.6)

## 2023-09-24 LAB — POCT UA - MICROALBUMIN
Creatinine, POC: 200 mg/dL
Microalbumin Ur, POC: 80 mg/L

## 2023-09-24 MED ORDER — HYDROXYZINE PAMOATE 25 MG PO CAPS: ORAL_CAPSULE | ORAL | 5 refills | Status: DC

## 2023-09-24 MED ORDER — ONDANSETRON HCL 8 MG PO TABS: ORAL_TABLET | ORAL | 1 refills | Status: DC

## 2023-09-26 ENCOUNTER — Encounter: Payer: Self-pay | Admitting: Physician Assistant

## 2023-09-26 DIAGNOSIS — Z961 Presence of intraocular lens: Secondary | ICD-10-CM | POA: Diagnosis not present

## 2023-09-26 DIAGNOSIS — H35372 Puckering of macula, left eye: Secondary | ICD-10-CM | POA: Diagnosis not present

## 2023-09-26 DIAGNOSIS — H43811 Vitreous degeneration, right eye: Secondary | ICD-10-CM | POA: Diagnosis not present

## 2023-09-26 DIAGNOSIS — H353133 Nonexudative age-related macular degeneration, bilateral, advanced atrophic without subfoveal involvement: Secondary | ICD-10-CM | POA: Diagnosis not present

## 2023-09-26 DIAGNOSIS — H35033 Hypertensive retinopathy, bilateral: Secondary | ICD-10-CM | POA: Diagnosis not present

## 2023-09-26 NOTE — Progress Notes (Signed)
 Established Patient Office Visit  Subjective   Patient ID: Sarah Wright, female    DOB: 24-May-1945  Age: 78 y.o. MRN: [id number]  Chief Complaint  Patient presents with   Medical Management of Chronic Issues    3 mo d/m    HPI Pt is a 78 yo female with T2DM, Chronic pain, HLD, RLS, COPD who presents to the clinic for 3 month follow up.   Pt request to have mounjaro  increased for more weight loss. She is not checking her sugars. She denies any CP, palpitations. She denies any hypoglycemic events.   She is on oxycodone  for chronic pain and needs refills.   ROS See HPI.    Objective:      BP 107/82   Pulse 70   Ht 5\' 4"  (1.626 m)   Wt 180 lb (81.6 kg)   SpO2 97%   BMI 30.90 kg/m  BP Readings from Last 3 Encounters:  09/24/23 107/82  06/20/23 111/66  06/11/23 (!) 91/50   Wt Readings from Last 3 Encounters:  09/24/23 180 lb (81.6 kg)  06/20/23 177 lb 8 oz (80.5 kg)  06/11/23 170 lb (77.1 kg)      Physical Exam Constitutional:      Appearance: Normal appearance.  HENT:     Head: Normocephalic.  Cardiovascular:     Rate and Rhythm: Normal rate and regular rhythm.     Heart sounds: Murmur heard.  Pulmonary:     Effort: Pulmonary effort is normal.     Breath sounds: Normal breath sounds.  Musculoskeletal:     Right lower leg: No edema.     Left lower leg: No edema.  Neurological:     General: No focal deficit present.     Mental Status: She is alert and oriented to person, place, and time.  Psychiatric:        Mood and Affect: Mood normal.      Results for orders placed or performed in visit on 09/24/23  POCT HgB A1C  Result Value Ref Range   Hemoglobin A1C 4.7 4.0 - 5.6 %   HbA1c POC (<> result, manual entry)     HbA1c, POC (prediabetic range)     HbA1c, POC (controlled diabetic range)    POCT UA - Microalbumin  Result Value Ref Range   Microalbumin Ur, POC 80 mg/L   Creatinine, POC 200 mg/dL   Albumin/Creatinine Ratio, Urine, POC 30-300       Assessment & Plan:  Aaron AasAaron AasLuanne was seen today for medical management of chronic issues.  Diagnoses and all orders for this visit:  Type 2 diabetes mellitus with diabetic polyneuropathy, without long-term current use of insulin  (HCC) -     POCT HgB A1C -     CMP14+EGFR -     POCT UA - Microalbumin  Pain management -     oxyCODONE  (ROXICODONE ) 15 MG immediate release tablet; Take 1 tablet (15 mg total) by mouth every 8 (eight) hours as needed for pain. -     oxyCODONE  (ROXICODONE ) 15 MG immediate release tablet; Take 1 tablet (15 mg total) by mouth every 8 (eight) hours as needed for pain. -     oxyCODONE  (ROXICODONE ) 15 MG immediate release tablet; Take 1 tablet (15 mg total) by mouth every 8 (eight) hours as needed for pain.  Chronic pain syndrome -     oxyCODONE  (ROXICODONE ) 15 MG immediate release tablet; Take 1 tablet (15 mg total) by mouth every 8 (eight) hours as needed  for pain. -     oxyCODONE  (ROXICODONE ) 15 MG immediate release tablet; Take 1 tablet (15 mg total) by mouth every 8 (eight) hours as needed for pain. -     oxyCODONE  (ROXICODONE ) 15 MG immediate release tablet; Take 1 tablet (15 mg total) by mouth every 8 (eight) hours as needed for pain.  RLS (restless legs syndrome)  Orthostatic hypotension  Primary osteoarthritis of both knees  Chronic respiratory failure with hypoxia (HCC)  Panic attack -     hydrOXYzine  (VISTARIL ) 25 MG capsule; Take 1-2 tablets as needed up to three times a day for anxiety/panic.  Immunization due Environmental education officer Covid -19 Vaccine 86yrs and older  Nausea -     ondansetron  (ZOFRAN ) 8 MG tablet; TAKE 1 TABLET BY MOUTH EVERY 8 HOURS AS NEEDED FOR NAUSEA AND VOMITING   A1c to goal Stay on same dose of mounjaro  not worth side effects for weight loss BP to goal Microalbumin abnormal On statin Needs eye exam Foot exam UTD Vaccines UTD Covid booster given today Follow up in 3 months  Chronic pain contract UTD UDS UTD .Aaron AasPDMP  reviewed during this encounter. No concerns Oxycodone  refilled Follow up in 3 months  Use O2 as needed  Hydroxyzine  for as needed anxiety  Return in about 3 months (around 12/25/2023).    Talayeh Bruinsma, PA-C

## 2023-09-27 ENCOUNTER — Telehealth: Payer: Self-pay

## 2023-09-27 ENCOUNTER — Other Ambulatory Visit (HOSPITAL_COMMUNITY): Payer: Self-pay

## 2023-09-27 NOTE — Telephone Encounter (Signed)
 Pharmacy Patient Advocate Encounter  Received notification from [facility] that Prior Authorization for hydrOXYzine  Pamoate 25MG  capsules  has been APPROVED from 04/25/23 to 09/26/24. Unable to obtain price due to refill too soon rejection, last fill date 09/27/23 next available fill date6/13/25   PA #/Case ID/Reference #:  [id number]

## 2023-09-27 NOTE — Telephone Encounter (Signed)
 Pharmacy Patient Advocate Encounter   Received notification from Patient Advice Request messages that prior authorization for hydrOXYzine  Pamoate 25MG  capsules is required/requested.   Insurance verification completed.   The patient is insured through [facility] .   Per test claim: PA required; PA submitted to above mentioned insurance via CoverMyMeds Key/confirmation #/EOC [id number] Status is pending

## 2023-09-28 NOTE — Telephone Encounter (Signed)
 Received notification from [facility] that Prior Authorization for hydrOXYzine  Pamoate 25MG  capsules  has been APPROVED from 04/25/23 to 09/26/24. Unable to obtain price due to refill too soon rejection, last fill date 09/27/23 next available fill date6/13/25.PA #/Case ID/Reference #:  [id number].   Patient has been informed in separate message.

## 2023-09-29 ENCOUNTER — Other Ambulatory Visit: Payer: Self-pay | Admitting: Physician Assistant

## 2023-09-29 DIAGNOSIS — E7849 Other hyperlipidemia: Secondary | ICD-10-CM

## 2023-10-01 ENCOUNTER — Encounter: Payer: Self-pay | Admitting: Physician Assistant

## 2023-10-01 MED ORDER — OXYCODONE HCL 15 MG PO TABS: 15.0000 mg | ORAL_TABLET | Freq: Three times a day (TID) | ORAL | 0 refills | Status: AC | PRN

## 2023-10-01 MED ORDER — OXYCODONE HCL 15 MG PO TABS: 15.0000 mg | ORAL_TABLET | Freq: Three times a day (TID) | ORAL | 0 refills | Status: DC | PRN

## 2023-10-01 NOTE — Telephone Encounter (Signed)
 Pt requesting medication that has not been filled since March. No corresponding labs for lipid panel. Please advise.

## 2023-10-03 ENCOUNTER — Encounter: Payer: Self-pay | Admitting: Sports Medicine

## 2023-10-14 ENCOUNTER — Other Ambulatory Visit: Payer: Self-pay | Admitting: Family Medicine

## 2023-10-19 ENCOUNTER — Other Ambulatory Visit: Payer: Self-pay | Admitting: Physician Assistant

## 2023-10-19 DIAGNOSIS — F5101 Primary insomnia: Secondary | ICD-10-CM

## 2023-10-24 ENCOUNTER — Encounter: Payer: Self-pay | Admitting: Physician Assistant

## 2023-10-29 ENCOUNTER — Other Ambulatory Visit: Payer: Self-pay | Admitting: Physician Assistant

## 2023-10-29 DIAGNOSIS — G2581 Restless legs syndrome: Secondary | ICD-10-CM

## 2023-10-29 DIAGNOSIS — F332 Major depressive disorder, recurrent severe without psychotic features: Secondary | ICD-10-CM

## 2023-10-29 DIAGNOSIS — F419 Anxiety disorder, unspecified: Secondary | ICD-10-CM

## 2023-10-31 ENCOUNTER — Telehealth: Payer: Self-pay | Admitting: Physician Assistant

## 2023-10-31 DIAGNOSIS — G2581 Restless legs syndrome: Secondary | ICD-10-CM

## 2023-10-31 NOTE — Telephone Encounter (Unsigned)
 Copied from CRM [id number]. Topic: Clinical - Medication Refill >> Oct 31, 2023  2:39 PM Brittney F wrote:   Medication: primidone  (MYSOLINE ) 50 MG tablet  Has the patient contacted their pharmacy? Yes   This is the patient's preferred pharmacy:  [id number] [place], [place] - [address] [place] [address] Phone: [phone] Fax: [phone]  Is this the correct pharmacy for this prescription? Yes    Has the prescription been filled recently? No  Is the patient out of the medication? No  Has the patient been seen for an appointment in the last year OR does the patient have an upcoming appointment? Yes  Can we respond through MyChart? Yes  Agent: Please be advised that Rx refills may take up to 3 business days. We ask that you follow-up with your pharmacy.

## 2023-11-21 DIAGNOSIS — H353134 Nonexudative age-related macular degeneration, bilateral, advanced atrophic with subfoveal involvement: Secondary | ICD-10-CM | POA: Diagnosis not present

## 2023-11-23 DIAGNOSIS — B351 Tinea unguium: Secondary | ICD-10-CM | POA: Diagnosis not present

## 2023-11-23 DIAGNOSIS — L97512 Non-pressure chronic ulcer of other part of right foot with fat layer exposed: Secondary | ICD-10-CM | POA: Diagnosis not present

## 2023-11-23 DIAGNOSIS — L851 Acquired keratosis [keratoderma] palmaris et plantaris: Secondary | ICD-10-CM | POA: Diagnosis not present

## 2023-11-23 DIAGNOSIS — I739 Peripheral vascular disease, unspecified: Secondary | ICD-10-CM | POA: Diagnosis not present

## 2023-11-23 DIAGNOSIS — R234 Changes in skin texture: Secondary | ICD-10-CM | POA: Diagnosis not present

## 2023-11-23 DIAGNOSIS — M2042 Other hammer toe(s) (acquired), left foot: Secondary | ICD-10-CM | POA: Diagnosis not present

## 2023-11-23 DIAGNOSIS — M216X1 Other acquired deformities of right foot: Secondary | ICD-10-CM | POA: Diagnosis not present

## 2023-11-23 DIAGNOSIS — M2041 Other hammer toe(s) (acquired), right foot: Secondary | ICD-10-CM | POA: Diagnosis not present

## 2023-11-23 DIAGNOSIS — I872 Venous insufficiency (chronic) (peripheral): Secondary | ICD-10-CM | POA: Diagnosis not present

## 2023-11-23 DIAGNOSIS — L84 Corns and callosities: Secondary | ICD-10-CM | POA: Diagnosis not present

## 2023-12-11 ENCOUNTER — Other Ambulatory Visit: Payer: Self-pay | Admitting: Physician Assistant

## 2023-12-11 DIAGNOSIS — R110 Nausea: Secondary | ICD-10-CM

## 2023-12-14 ENCOUNTER — Other Ambulatory Visit: Payer: Self-pay | Admitting: Physician Assistant

## 2023-12-14 DIAGNOSIS — R110 Nausea: Secondary | ICD-10-CM

## 2023-12-14 NOTE — Telephone Encounter (Signed)
 Copied from CRM #[id number]. Topic: Clinical - Medication Refill >> Dec 14, 2023  1:42 PM [name] wrote: Medication: ondansetron  (ZOFRAN ) 8 MG tablet  Has the patient contacted their pharmacy? Yes (Agent: If no, request that the patient contact the pharmacy for the refill. If patient does not wish to contact the pharmacy document the reason why and proceed with request.) (Agent: If yes, when and what did the pharmacy advise?)  This is the patient's preferred pharmacy:  [id number] [place], [place] - [address] [place] [address] Phone: [phone] Fax: [phone]  Is this the correct pharmacy for this prescription? Yes If no, delete pharmacy and type the correct one.   Has the prescription been filled recently? No  Is the patient out of the medication? Yes  Has the patient been seen for an appointment in the last year OR does the patient have an upcoming appointment? Yes  Can we respond through MyChart? No  Agent: Please be advised that Rx refills may take up to 3 business days. We ask that you follow-up with your pharmacy.

## 2023-12-17 ENCOUNTER — Encounter: Payer: Self-pay | Admitting: Physician Assistant

## 2023-12-17 MED ORDER — ONDANSETRON HCL 8 MG PO TABS: ORAL_TABLET | ORAL | 1 refills | Status: DC

## 2023-12-25 ENCOUNTER — Encounter: Payer: Self-pay | Admitting: Sports Medicine

## 2023-12-25 ENCOUNTER — Ambulatory Visit: Admitting: Physician Assistant

## 2023-12-25 ENCOUNTER — Encounter: Payer: Self-pay | Admitting: Physician Assistant

## 2023-12-25 VITALS — BP 110/47 | HR 67 | Ht 64.0 in | Wt 183.0 lb

## 2023-12-25 DIAGNOSIS — R42 Dizziness and giddiness: Secondary | ICD-10-CM

## 2023-12-25 DIAGNOSIS — R29898 Other symptoms and signs involving the musculoskeletal system: Secondary | ICD-10-CM

## 2023-12-25 DIAGNOSIS — Z7985 Long-term (current) use of injectable non-insulin antidiabetic drugs: Secondary | ICD-10-CM

## 2023-12-25 DIAGNOSIS — R296 Repeated falls: Secondary | ICD-10-CM

## 2023-12-25 DIAGNOSIS — I951 Orthostatic hypotension: Secondary | ICD-10-CM

## 2023-12-25 DIAGNOSIS — E1142 Type 2 diabetes mellitus with diabetic polyneuropathy: Secondary | ICD-10-CM

## 2023-12-25 DIAGNOSIS — G894 Chronic pain syndrome: Secondary | ICD-10-CM

## 2023-12-25 DIAGNOSIS — R2689 Other abnormalities of gait and mobility: Secondary | ICD-10-CM

## 2023-12-25 DIAGNOSIS — R52 Pain, unspecified: Secondary | ICD-10-CM

## 2023-12-25 DIAGNOSIS — H539 Unspecified visual disturbance: Secondary | ICD-10-CM

## 2023-12-25 LAB — POCT GLYCOSYLATED HEMOGLOBIN (HGB A1C): Hemoglobin A1C: 5 % (ref 4.0–5.6)

## 2023-12-25 MED ORDER — MIDODRINE HCL 10 MG PO TABS
10.0000 mg | ORAL_TABLET | Freq: Three times a day (TID) | ORAL | 1 refills | Status: AC
Start: 2023-12-25 — End: ?

## 2023-12-25 MED ORDER — OXYCODONE HCL 15 MG PO TABS: 15.0000 mg | ORAL_TABLET | Freq: Three times a day (TID) | ORAL | 0 refills | Status: AC | PRN

## 2023-12-25 MED ORDER — OXYCODONE HCL 15 MG PO TABS: 15.0000 mg | ORAL_TABLET | Freq: Three times a day (TID) | ORAL | 0 refills | Status: DC | PRN

## 2023-12-25 MED ORDER — MOUNJARO 10 MG/0.5ML ~~LOC~~ SOAJ: 10.0000 mg | SUBCUTANEOUS | 1 refills | Status: AC

## 2023-12-25 MED ORDER — OXYCODONE HCL 15 MG PO TABS
15.0000 mg | ORAL_TABLET | Freq: Three times a day (TID) | ORAL | 0 refills | Status: AC | PRN
Start: 2024-01-24 — End: 2024-02-23

## 2023-12-25 NOTE — Patient Instructions (Addendum)
 Will order MRI of brain Will get PT order to come out to house for PT for balance/vertigo Start wearing compression stockings Make sure drinking water and eating salt daily

## 2023-12-26 DIAGNOSIS — R29898 Other symptoms and signs involving the musculoskeletal system: Secondary | ICD-10-CM | POA: Insufficient documentation

## 2023-12-26 NOTE — Progress Notes (Signed)
 Established Patient Office Visit  Subjective   Patient ID: Sarah Wright, female    DOB: 1946/04/22  Age: 78 y.o. MRN: [id number]  No chief complaint on file.   HPI Pt is a 78 yo obese female with T2DM, Orthostatic hypotension, MDD, anxiety, chronic pain who presents to the clinic for medication refills. Her daughters accompanies her. She is having more falls and dizzy episodes. She has lost over 100lbs on mounjaro . She is not wearing compression stockings. She is not on BP medications. She does take midodrine . She is losing strength in her legs. Pain is overall controlled on same dose of oxycodone  for years. She is on O2 with any exertion.    ROS See HPI.    Objective:     BP (!) 110/47   Pulse 67   Ht 5' 4 (1.626 m)   Wt 183 lb (83 kg)   SpO2 99%   BMI 31.41 kg/m  BP Readings from Last 3 Encounters:  12/25/23 (!) 110/47  09/24/23 107/82  06/20/23 111/66   Wt Readings from Last 3 Encounters:  12/25/23 183 lb (83 kg)  09/24/23 180 lb (81.6 kg)  06/20/23 177 lb 8 oz (80.5 kg)      Physical Exam Constitutional:      Appearance: She is obese.     Comments: In a wheelchair pale appearing  Cardiovascular:     Rate and Rhythm: Normal rate and regular rhythm.  Pulmonary:     Effort: Pulmonary effort is normal.     Breath sounds: Normal breath sounds.     Comments: On O2 2L.  Musculoskeletal:     Right lower leg: No edema.     Left lower leg: No edema.  Neurological:     General: No focal deficit present.     Mental Status: She is alert and oriented to person, place, and time.  Psychiatric:        Mood and Affect: Mood normal.      Results for orders placed or performed in visit on 12/25/23  POCT HgB A1C  Result Value Ref Range   Hemoglobin A1C 5.0 4.0 - 5.6 %   HbA1c POC (<> result, manual entry)     HbA1c, POC (prediabetic range)     HbA1c, POC (controlled diabetic range)        Assessment & Plan:  SABRASABRADiagnoses and all orders for this visit:  Type  2 diabetes mellitus with diabetic polyneuropathy, without long-term current use of insulin  (HCC) -     POCT HgB A1C -     tirzepatide  (MOUNJARO ) 10 MG/0.5ML Pen; Inject 10 mg into the skin once a week.  Vertigo  Orthostatic hypotension -     midodrine  (PROAMATINE ) 10 MG tablet; Take 1 tablet (10 mg total) by mouth 3 (three) times daily.  Pain management -     oxyCODONE  (ROXICODONE ) 15 MG immediate release tablet; Take 1 tablet (15 mg total) by mouth every 8 (eight) hours as needed for pain. -     oxyCODONE  (ROXICODONE ) 15 MG immediate release tablet; Take 1 tablet (15 mg total) by mouth every 8 (eight) hours as needed for pain. -     oxyCODONE  (ROXICODONE ) 15 MG immediate release tablet; Take 1 tablet (15 mg total) by mouth every 8 (eight) hours as needed for pain.  Chronic pain syndrome -     oxyCODONE  (ROXICODONE ) 15 MG immediate release tablet; Take 1 tablet (15 mg total) by mouth every 8 (eight) hours as needed for  pain. -     oxyCODONE  (ROXICODONE ) 15 MG immediate release tablet; Take 1 tablet (15 mg total) by mouth every 8 (eight) hours as needed for pain. -     oxyCODONE  (ROXICODONE ) 15 MG immediate release tablet; Take 1 tablet (15 mg total) by mouth every 8 (eight) hours as needed for pain.   A1C to goal Continue on same dose of medications BP to goal On statin Needs to schedule eye exam Foot exam UTD Declined flu shot  Pain clinic contract UTD .SABRAPDMP reviewed during this encounter. No concerns Oxycodone  refilled Follow up in 3 months   Frequent falls/dizziness Last seen cardiology 02/2023 Continue on midodrine  Will get MRI of brain Encouraged compression stocking Eat more salt and stay hydrated PT at home ordered for leg strength and balance   Return in about 3 months (around 03/25/2024).    Hurbert Duran, PA-C

## 2023-12-31 ENCOUNTER — Encounter: Payer: Self-pay | Admitting: Physician Assistant

## 2024-01-01 ENCOUNTER — Telehealth: Payer: Self-pay

## 2024-01-01 DIAGNOSIS — Z9981 Dependence on supplemental oxygen: Secondary | ICD-10-CM | POA: Diagnosis not present

## 2024-01-01 DIAGNOSIS — E1142 Type 2 diabetes mellitus with diabetic polyneuropathy: Secondary | ICD-10-CM | POA: Diagnosis not present

## 2024-01-01 DIAGNOSIS — Z79891 Long term (current) use of opiate analgesic: Secondary | ICD-10-CM | POA: Diagnosis not present

## 2024-01-01 DIAGNOSIS — G894 Chronic pain syndrome: Secondary | ICD-10-CM | POA: Diagnosis not present

## 2024-01-01 DIAGNOSIS — Z7982 Long term (current) use of aspirin: Secondary | ICD-10-CM | POA: Diagnosis not present

## 2024-01-01 DIAGNOSIS — Z604 Social exclusion and rejection: Secondary | ICD-10-CM | POA: Diagnosis not present

## 2024-01-01 DIAGNOSIS — Z9181 History of falling: Secondary | ICD-10-CM | POA: Diagnosis not present

## 2024-01-01 DIAGNOSIS — I951 Orthostatic hypotension: Secondary | ICD-10-CM | POA: Diagnosis not present

## 2024-01-01 NOTE — Telephone Encounter (Signed)
 Copied from CRM [id number]. Topic: Clinical - Home Health Verbal Orders >> Jan 01, 2024  4:19 PM Susanna ORN wrote: Caller/Agency: Alejandra, Physical therapist with [facility] Callback Number: [phone] Service Requested: Physical Therapy Frequency: 2 weeks-four & one week- four Any new concerns about the patient? No

## 2024-01-02 NOTE — Telephone Encounter (Signed)
 Called and left detailed verbal order approval information on Sarah Wright with [facility] voice mail

## 2024-01-02 NOTE — Telephone Encounter (Signed)
 Ok

## 2024-01-07 ENCOUNTER — Ambulatory Visit

## 2024-01-07 DIAGNOSIS — R2689 Other abnormalities of gait and mobility: Secondary | ICD-10-CM

## 2024-01-07 DIAGNOSIS — I951 Orthostatic hypotension: Secondary | ICD-10-CM

## 2024-01-07 DIAGNOSIS — R296 Repeated falls: Secondary | ICD-10-CM | POA: Diagnosis not present

## 2024-01-07 DIAGNOSIS — R42 Dizziness and giddiness: Secondary | ICD-10-CM

## 2024-01-07 DIAGNOSIS — H539 Unspecified visual disturbance: Secondary | ICD-10-CM

## 2024-01-07 MED ORDER — GADOBUTROL 1 MMOL/ML IV SOLN
8.0000 mL | Freq: Once | INTRAVENOUS | Status: AC | PRN
  Administered 2024-01-07: 8 mL via INTRAVENOUS

## 2024-01-08 ENCOUNTER — Other Ambulatory Visit: Payer: Self-pay | Admitting: Physician Assistant

## 2024-01-08 ENCOUNTER — Ambulatory Visit

## 2024-01-08 ENCOUNTER — Encounter

## 2024-01-08 VITALS — Ht 64.0 in | Wt 183.0 lb

## 2024-01-08 DIAGNOSIS — I951 Orthostatic hypotension: Secondary | ICD-10-CM | POA: Diagnosis not present

## 2024-01-08 DIAGNOSIS — Z7982 Long term (current) use of aspirin: Secondary | ICD-10-CM | POA: Diagnosis not present

## 2024-01-08 DIAGNOSIS — Z0000 Encounter for general adult medical examination without abnormal findings: Secondary | ICD-10-CM | POA: Diagnosis not present

## 2024-01-08 DIAGNOSIS — F332 Major depressive disorder, recurrent severe without psychotic features: Secondary | ICD-10-CM

## 2024-01-08 DIAGNOSIS — Z79891 Long term (current) use of opiate analgesic: Secondary | ICD-10-CM | POA: Diagnosis not present

## 2024-01-08 DIAGNOSIS — Z1231 Encounter for screening mammogram for malignant neoplasm of breast: Secondary | ICD-10-CM

## 2024-01-08 DIAGNOSIS — Z9981 Dependence on supplemental oxygen: Secondary | ICD-10-CM | POA: Diagnosis not present

## 2024-01-08 DIAGNOSIS — G894 Chronic pain syndrome: Secondary | ICD-10-CM | POA: Diagnosis not present

## 2024-01-08 DIAGNOSIS — E1142 Type 2 diabetes mellitus with diabetic polyneuropathy: Secondary | ICD-10-CM | POA: Diagnosis not present

## 2024-01-08 DIAGNOSIS — E7849 Other hyperlipidemia: Secondary | ICD-10-CM

## 2024-01-08 NOTE — Progress Notes (Signed)
 Subjective:   Sarah Wright is a 78 y.o. female who presents for Medicare Annual (Subsequent) preventive examination.  Visit Complete: Virtual I connected with  Sarah Wright on 01/08/24 by a audio enabled telemedicine application and verified that I am speaking with the correct person using two identifiers.  Patient Location: Home  Provider Location: Office/Clinic  I discussed the limitations of evaluation and management by telemedicine. The patient expressed understanding and agreed to proceed.  Vital Signs: Because this visit was a virtual/telehealth visit, some criteria may be missing or patient reported. Any vitals not documented were not able to be obtained and vitals that have been documented are patient reported.  Patient Medicare AWV questionnaire was completed by the patient on n/a; I have confirmed that all information answered by patient is correct and no changes since this date.  Cardiac Risk Factors include: advanced age (>74men, >29 women);obesity (BMI >30kg/m2);sedentary lifestyle;smoking/ tobacco exposure;diabetes mellitus;dyslipidemia;family history of premature cardiovascular disease     Objective:    Today's Vitals   01/08/24 1356 01/08/24 1357  Weight: 183 lb (83 kg)   Height: 5' 4 (1.626 m)   PainSc:  6    Body mass index is 31.41 kg/m.     01/08/2024    2:07 PM 01/02/2023    1:25 PM 12/30/2021    1:58 PM 03/16/2014    1:56 PM  Advanced Directives  Does Patient Have a Medical Advance Directive? No Yes No No   Type of Advance Directive  Living will;Healthcare Power of Attorney    Does patient want to make changes to medical advance directive?  No - Patient declined    Copy of Healthcare Power of Attorney in Chart?  No - copy requested    Would patient like information on creating a medical advance directive? No - Patient declined  No - Patient declined No - patient declined information      Data saved with a previous flowsheet row definition    Current  Medications (verified) Outpatient Encounter Medications as of 01/08/2024  Medication Sig   aspirin 81 MG tablet Take 81 mg by mouth daily.   atorvastatin  (LIPITOR) 20 MG tablet TAKE 1 TABLET BY MOUTH EVERY DAY   BREZTRI  AEROSPHERE 160-9-4.8 MCG/ACT AERO INHALE 2 PUFFS INTO THE LUNGS IN THE MORNING AND AT BEDTIME.   buPROPion  (WELLBUTRIN  XL) 150 MG 24 hr tablet TAKE 2 TABLETS BY MOUTH EVERY DAY   DULoxetine  (CYMBALTA ) 60 MG capsule TAKE 1 CAPSULE BY MOUTH TWICE A DAY   fluticasone  (FLONASE ) 50 MCG/ACT nasal spray SPRAY 2 SPRAYS INTO EACH NOSTRIL EVERY DAY   hydrOXYzine  (VISTARIL ) 25 MG capsule Take 1-2 tablets as needed up to three times a day for anxiety/panic.   ipratropium-albuterol  (DUONEB) 0.5-2.5 (3) MG/3ML SOLN Take 3 mLs by nebulization every 4 (four) hours as needed.   levalbuterol (XOPENEX HFA) 45 MCG/ACT inhaler INHALE 1 TO 2 PUFFS BY MOUTH EVERY 6 HOURS AS NEEDED FOR WHEEZE   meloxicam  (MOBIC ) 15 MG tablet TAKE 1 TABLET (15 MG TOTAL) BY MOUTH DAILY.   midodrine  (PROAMATINE ) 10 MG tablet Take 1 tablet (10 mg total) by mouth 3 (three) times daily.   montelukast  (SINGULAIR ) 10 MG tablet TAKE 1 TABLET BY MOUTH EVERYDAY AT BEDTIME   OLANZapine  (ZYPREXA ) 10 MG tablet TAKE 1 TABLET BY MOUTH EVERYDAY AT BEDTIME   ondansetron  (ZOFRAN ) 8 MG tablet TAKE 1 TABLET BY MOUTH EVERY 8 HOURS AS NEEDED FOR NAUSEA AND VOMITING   [START ON 01/24/2024] oxyCODONE  (ROXICODONE ) 15  MG immediate release tablet Take 1 tablet (15 mg total) by mouth every 8 (eight) hours as needed for pain.   oxyCODONE  (ROXICODONE ) 15 MG immediate release tablet Take 1 tablet (15 mg total) by mouth every 8 (eight) hours as needed for pain.   [START ON 02/23/2024] oxyCODONE  (ROXICODONE ) 15 MG immediate release tablet Take 1 tablet (15 mg total) by mouth every 8 (eight) hours as needed for pain.   pramipexole  (MIRAPEX ) 0.5 MG tablet TAKE ONE TABLET 2-3 HOURS BEFORE BEDTIME.   primidone  (MYSOLINE ) 50 MG tablet TAKE 1 TABLET BY MOUTH  EVERYDAY AT BEDTIME   tirzepatide  (MOUNJARO ) 10 MG/0.5ML Pen Inject 10 mg into the skin once a week.   traZODone  (DESYREL ) 100 MG tablet TAKE 1 TABLET BY MOUTH EVERYDAY AT BEDTIME   No facility-administered encounter medications on file as of 01/08/2024.    Allergies (verified) Molnupiravir , Gabapentin , and Sulfa antibiotics   History: Past Medical History:  Diagnosis Date   Diabetes mellitus without complication (HCC)    Hyperlipidemia    Orthostatic hypotension    Thyroid  disease    Past Surgical History:  Procedure Laterality Date   ABDOMINAL HYSTERECTOMY     Adrenal mass resection     CARPAL TUNNEL RELEASE     both wrists   CATARACT EXTRACTION, BILATERAL     CERVICAL FUSION     GALLBLADDER SURGERY     right foot surgery     THYROIDECTOMY, PARTIAL     right side removed   Family History  Problem Relation Age of Onset   Depression Mother    Heart attack Father    Diabetes Father    Uterine cancer Other    Pancreatic cancer Other    Hypertension Other        parents   Social History   Socioeconomic History   Marital status: Divorced    Spouse name: Not on file   Number of children: 3   Years of education: 13.5   Highest education level: 12th grade  Occupational History   Occupation: Retired  Tobacco Use   Smoking status: Former    Current packs/day: 0.00    Average packs/day: 1 pack/day for 54.4 years (54.4 ttl pk-yrs)    Types: Cigarettes    Start date: 09/24/1959    Quit date: 02/12/2014    Years since quitting: 9.9   Smokeless tobacco: Never  Vaping Use   Vaping status: Never Used  Substance and Sexual Activity   Alcohol use: No    Alcohol/week: 0.0 standard drinks of alcohol   Drug use: No   Sexual activity: Not Currently  Other Topics Concern   Not on file  Social History Narrative   Lives with her daughter and grand daughter. She enjoys spending time with family.   Social Drivers of Corporate investment banker Strain: Low Risk  (01/08/2024)    Overall Financial Resource Strain (CARDIA)    Difficulty of Paying Living Expenses: Not hard at all  Food Insecurity: No Food Insecurity (01/08/2024)   Hunger Vital Sign    Worried About Running Out of Food in the Last Year: Never true    Ran Out of Food in the Last Year: Never true  Transportation Needs: No Transportation Needs (01/08/2024)   PRAPARE - Administrator, Civil Service (Medical): No    Lack of Transportation (Non-Medical): No  Physical Activity: Insufficiently Active (01/08/2024)   Exercise Vital Sign    Days of Exercise per Week: 2 days  Minutes of Exercise per Session: 20 min  Stress: No Stress Concern Present (01/08/2024)   [employer] of Occupational Health - Occupational Stress Questionnaire    Feeling of Stress: Not at all  Social Connections: Socially Isolated (01/08/2024)   Social Connection and Isolation Panel    Frequency of Communication with Friends and Family: Three times a week    Frequency of Social Gatherings with Friends and Family: More than three times a week    Attends Religious Services: Never    Database administrator or Organizations: No    Attends Engineer, structural: Never    Marital Status: Divorced    Tobacco Counseling Counseling given: Not Answered   Clinical Intake:  Pre-visit preparation completed: Yes  Pain : 0-10 Pain Score: 6  Pain Type: Chronic pain Pain Location: Back Pain Orientation: Lower Pain Onset: More than a month ago Pain Frequency: Intermittent     BMI - recorded: 31.41 Nutritional Status: BMI > 30  Obese Nutritional Risks: None Diabetes: No  How often do you need to have someone help you when you read instructions, pamphlets, or other written materials from your doctor or pharmacy?: 1 - Never What is the last grade level you completed in school?: 12  Interpreter Needed?: No      Activities of Daily Living    01/08/2024    1:58 PM  In your present state of health, do you  have any difficulty performing the following activities:  Hearing? 0  Vision? 1  Difficulty concentrating or making decisions? 0  Walking or climbing stairs? 1  Dressing or bathing? 0  Doing errands, shopping? 0  Preparing Food and eating ? N  Using the Toilet? N  In the past six months, have you accidently leaked urine? Y  Do you have problems with loss of bowel control? N  Managing your Medications? N  Managing your Finances? N  Housekeeping or managing your Housekeeping? N    Patient Care Team: Breeback, Jade L, PA-C as PCP - General (Family Medicine) Lindaann Dunnings, MD as Referring Physician (Internal Medicine) Waddell Victory PARAS, DPM as Referring Physician (Podiatry) Pietro Redell RAMAN, MD as Consulting Physician (Cardiology) Edmond Katz, MD (Internal Medicine) Obadiah Vernell BIRCH, FNP (Urology)  Indicate any recent Medical Services you may have received from other than [facility] providers in the past year (date may be approximate).     Assessment:   This is a routine wellness examination for Sarah Wright.  Hearing/Vision screen No results found.   Goals Addressed             This Visit's Progress    Patient Stated       Patient states she would like to be more active.        Depression Screen    01/08/2024    2:06 PM 09/24/2023    3:57 PM 01/02/2023    1:26 PM 10/16/2022   11:15 AM 10/02/2022    3:59 PM 08/02/2022   11:18 AM 12/30/2021    1:54 PM  PHQ 2/9 Scores  PHQ - 2 Score 1 0 0 0 6 0 0  PHQ- 9 Score       5    Fall Risk    01/08/2024    2:08 PM 01/02/2023    1:26 PM 10/16/2022   11:15 AM 10/02/2022    3:59 PM 08/02/2022   11:18 AM  Fall Risk   Falls in the past year? 1 1 1 1  0  Number  falls in past yr: 1 1 1 1  0  Injury with Fall? 0 1 1 1  0  Risk for fall due to : History of fall(s);Impaired mobility History of fall(s);Impaired mobility History of fall(s);Impaired balance/gait History of fall(s)   Follow up Falls evaluation completed Falls evaluation  completed;Education provided;Falls prevention discussed Falls evaluation completed Falls evaluation completed     MEDICARE RISK AT HOME: Medicare Risk at Home Any stairs in or around the home?: No Home free of loose throw rugs in walkways, pet beds, electrical cords, etc?: No Adequate lighting in your home to reduce risk of falls?: Yes Life alert?: No Use of a cane, walker or w/c?: Yes Grab bars in the bathroom?: No Shower chair or bench in shower?: Yes Elevated toilet seat or a handicapped toilet?: No  TIMED UP AND GO:  Was the test performed?  No    Cognitive Function:        01/08/2024    2:09 PM 01/02/2023    1:33 PM 12/30/2021    2:03 PM  6CIT Screen  What Year? 0 points 0 points 0 points  What month? 0 points 0 points 0 points  What time? 0 points 0 points 0 points  Count back from 20 0 points 0 points 0 points  Months in reverse 0 points 0 points 0 points  Repeat phrase 0 points 0 points 0 points  Total Score 0 points 0 points 0 points    Immunizations Immunization History  Administered Date(s) Administered   Fluad Quad(high Dose 65+) 03/02/2021, 02/20/2022   Fluad Trivalent(High Dose 65+) 01/31/2022   INFLUENZA, HIGH DOSE SEASONAL PF 12/13/2016, 02/08/2018, 12/17/2018, 12/23/2022   Influenza-Unspecified 12/21/2014, 01/09/2016, 12/13/2016, 02/08/2018, 01/21/2020   Moderna Sars-Covid-2 Vaccination 07/09/2020   PFIZER(Purple Top)SARS-COV-2 Vaccination 05/20/2019, 06/17/2019   PPD Test 09/23/2014   Pfizer(Comirnaty)Fall Seasonal Vaccine 12 years and older 09/24/2023   Pneumococcal Conjugate-13 01/12/2014   Pneumococcal Polysaccharide-23 04/29/2003, 03/28/2016   Tdap 10/04/2015   Zoster Recombinant(Shingrix) 11/21/2016, 04/26/2022   Zoster, Live 05/22/2012    TDAP status: Up to date  Flu Vaccine status: Up to date  Pneumococcal vaccine status: Up to date  Covid-19 vaccine status: Completed vaccines  Qualifies for Shingles Vaccine? Yes   Zostavax completed  Yes   Shingrix Completed?: Yes  Screening Tests Health Maintenance  Topic Date Due   OPHTHALMOLOGY EXAM  07/26/2022   Influenza Vaccine  07/22/2024 (Originally 11/23/2023)   Lung Cancer Screening  09/23/2024 (Originally 11/08/2022)   Diabetic kidney evaluation - eGFR measurement  03/20/2024   COVID-19 Vaccine (5 - Mixed Product risk 2024-25 season) 03/25/2024   HEMOGLOBIN A1C  06/23/2024   Diabetic kidney evaluation - Urine ACR  09/23/2024   FOOT EXAM  09/23/2024   Medicare Annual Wellness (AWV)  01/07/2025   DTaP/Tdap/Td (2 - Td or Tdap) 10/03/2025   Pneumococcal Vaccine: 50+ Years  Completed   DEXA SCAN  Completed   Hepatitis C Screening  Completed   Zoster Vaccines- Shingrix  Completed   HPV VACCINES  Aged Out   Meningococcal B Vaccine  Aged Out   Mammogram  Discontinued   Colonoscopy  Discontinued    Health Maintenance  Health Maintenance Due  Topic Date Due   OPHTHALMOLOGY EXAM  07/26/2022    Colorectal cancer screening: Type of screening: Colonoscopy. Completed 04/13/2023. Repeat every 10 years  Mammogram status: Ordered 01/08/2024. Pt provided with contact info and advised to call to schedule appt.   Bone Density status: Completed 10/17/2022. Results reflect: Bone density  results: NORMAL. Repeat every 10 years.  Lung Cancer Screening: (Low Dose CT Chest recommended if Age 45-80 years, 20 pack-year currently smoking OR have quit w/in 15years.) does qualify.   Lung Cancer Screening Referral: patient declined.   Additional Screening:  Hepatitis C Screening: does qualify; Completed 10/04/2015  Vision Screening: Recommended annual ophthalmology exams for early detection of glaucoma and other disorders of the eye. Is the patient up to date with their annual eye exam?  Yes  Who is the provider or what is the name of the office in which the patient attends annual eye exams? [facility].  If pt is not established with a provider, would they like to be referred  to a provider to establish care? No .   Dental Screening: Recommended annual dental exams for proper oral hygiene  Diabetic Foot Exam: Diabetic Foot Exam: Completed 09/24/2023  Community Resource Referral / Chronic Care Management: CRR required this visit?  No   CCM required this visit?  No     Plan:     I have personally reviewed and noted the following in the patient's chart:   Medical and social history Use of alcohol, tobacco or illicit drugs  Current medications and supplements including opioid prescriptions. Patient is currently taking opioid prescriptions. Information provided to patient regarding non-opioid alternatives. Patient advised to discuss non-opioid treatment plan with their provider. Functional ability and status Nutritional status Physical activity Advanced directives List of other physicians Hospitalizations # 1, surgeries # 1, and ER # 0 visits in previous 12 months.  Vitals Screenings to include cognitive, depression, and falls Referrals and appointments  In addition, I have reviewed and discussed with patient certain preventive protocols, quality metrics, and best practice recommendations. A written personalized care plan for preventive services as well as general preventive health recommendations were provided to patient.     Bonny Jon Mayor, CMA   01/08/2024   After Visit Summary: (MyChart) Due to this being a telephonic visit, the after visit summary with patients personalized plan was offered to patient via MyChart   Nurse Notes:    Sarah Wright is a 78 y.o. female patient of Antoniette Vermell CROME, PA-C who had a [employer] Visit today via telephone. Sarah Wright is Retired and lives with their daughter. She has 3 children. She reports that she is socially active and does interact with friends/family regularly. She is minimally physically active and enjoys spending time with family.

## 2024-01-08 NOTE — Patient Instructions (Signed)
  Sarah Wright , Thank you for taking time to come for your Medicare Wellness Visit. I appreciate your ongoing commitment to your health goals. Please review the following plan we discussed and let me know if I can assist you in the future.   These are the goals we discussed:  Goals       Patient Stated (pt-stated)      12/30/2021 AWV Goal: Exercise for General Health  Patient will verbalize understanding of the benefits of increased physical activity: Exercising regularly is important. It will improve your overall fitness, flexibility, and endurance. Regular exercise also will improve your overall health. It can help you control your weight, reduce stress, and improve your bone density. Over the next year, patient will increase physical activity as tolerated with a goal of at least 150 minutes of moderate physical activity per week.  You can tell that you are exercising at a moderate intensity if your heart starts beating faster and you start breathing faster but can still hold a conversation. Moderate-intensity exercise ideas include: Walking 1 mile (1.6 km) in about 15 minutes Biking Hiking Golfing Dancing Water aerobics Patient will verbalize understanding of everyday activities that increase physical activity by providing examples like the following: Yard work, such as: Insurance underwriter Gardening Washing windows or floors Patient will be able to explain general safety guidelines for exercising:  Before you start a new exercise program, talk with your health care provider. Do not exercise so much that you hurt yourself, feel dizzy, or get very short of breath. Wear comfortable clothes and wear shoes with good support. Drink plenty of water while you exercise to prevent dehydration or heat stroke. Work out until your breathing and your heartbeat get faster.       Patient Stated (pt-stated)       Patient stated that she would like to maintain her current lifestyle.      Patient Stated      Patient states she would like to be more active.         This is a list of the screening recommended for you and due dates:  Health Maintenance  Topic Date Due   Eye exam for diabetics  07/26/2022   Flu Shot  07/22/2024*   Screening for Lung Cancer  09/23/2024*   Yearly kidney function blood test for diabetes  03/20/2024   COVID-19 Vaccine (5 - Mixed Product risk 2024-25 season) 03/25/2024   Hemoglobin A1C  06/23/2024   Yearly kidney health urinalysis for diabetes  09/23/2024   Complete foot exam   09/23/2024   Medicare Annual Wellness Visit  01/07/2025   DTaP/Tdap/Td vaccine (2 - Td or Tdap) 10/03/2025   Pneumococcal Vaccine for age over 57  Completed   DEXA scan (bone density measurement)  Completed   Hepatitis C Screening  Completed   Zoster (Shingles) Vaccine  Completed   HPV Vaccine  Aged Out   Meningitis B Vaccine  Aged Out   Breast Cancer Screening  Discontinued   Colon Cancer Screening  Discontinued  *Topic was postponed. The date shown is not the original due date.

## 2024-01-09 ENCOUNTER — Other Ambulatory Visit: Payer: Self-pay | Admitting: Physician Assistant

## 2024-01-09 DIAGNOSIS — R413 Other amnesia: Secondary | ICD-10-CM

## 2024-01-11 ENCOUNTER — Ambulatory Visit: Payer: Self-pay | Admitting: Physician Assistant

## 2024-01-11 NOTE — Progress Notes (Signed)
 Normal Brain MRI. GREAT news.

## 2024-01-14 DIAGNOSIS — Z79891 Long term (current) use of opiate analgesic: Secondary | ICD-10-CM | POA: Diagnosis not present

## 2024-01-14 DIAGNOSIS — I951 Orthostatic hypotension: Secondary | ICD-10-CM | POA: Diagnosis not present

## 2024-01-14 DIAGNOSIS — E1142 Type 2 diabetes mellitus with diabetic polyneuropathy: Secondary | ICD-10-CM | POA: Diagnosis not present

## 2024-01-14 DIAGNOSIS — Z9981 Dependence on supplemental oxygen: Secondary | ICD-10-CM | POA: Diagnosis not present

## 2024-01-14 DIAGNOSIS — Z7982 Long term (current) use of aspirin: Secondary | ICD-10-CM | POA: Diagnosis not present

## 2024-01-14 DIAGNOSIS — G894 Chronic pain syndrome: Secondary | ICD-10-CM | POA: Diagnosis not present

## 2024-01-15 DIAGNOSIS — B351 Tinea unguium: Secondary | ICD-10-CM | POA: Diagnosis not present

## 2024-01-15 DIAGNOSIS — L851 Acquired keratosis [keratoderma] palmaris et plantaris: Secondary | ICD-10-CM | POA: Diagnosis not present

## 2024-01-15 DIAGNOSIS — L84 Corns and callosities: Secondary | ICD-10-CM | POA: Diagnosis not present

## 2024-01-15 DIAGNOSIS — M205X1 Other deformities of toe(s) (acquired), right foot: Secondary | ICD-10-CM | POA: Diagnosis not present

## 2024-01-15 DIAGNOSIS — M2042 Other hammer toe(s) (acquired), left foot: Secondary | ICD-10-CM | POA: Diagnosis not present

## 2024-01-15 DIAGNOSIS — I872 Venous insufficiency (chronic) (peripheral): Secondary | ICD-10-CM | POA: Diagnosis not present

## 2024-01-15 DIAGNOSIS — M216X1 Other acquired deformities of right foot: Secondary | ICD-10-CM | POA: Diagnosis not present

## 2024-01-15 DIAGNOSIS — I739 Peripheral vascular disease, unspecified: Secondary | ICD-10-CM | POA: Diagnosis not present

## 2024-01-15 DIAGNOSIS — M2041 Other hammer toe(s) (acquired), right foot: Secondary | ICD-10-CM | POA: Diagnosis not present

## 2024-01-17 DIAGNOSIS — Z7982 Long term (current) use of aspirin: Secondary | ICD-10-CM | POA: Diagnosis not present

## 2024-01-17 DIAGNOSIS — I951 Orthostatic hypotension: Secondary | ICD-10-CM | POA: Diagnosis not present

## 2024-01-17 DIAGNOSIS — Z9981 Dependence on supplemental oxygen: Secondary | ICD-10-CM | POA: Diagnosis not present

## 2024-01-17 DIAGNOSIS — G894 Chronic pain syndrome: Secondary | ICD-10-CM | POA: Diagnosis not present

## 2024-01-17 DIAGNOSIS — E1142 Type 2 diabetes mellitus with diabetic polyneuropathy: Secondary | ICD-10-CM | POA: Diagnosis not present

## 2024-01-17 DIAGNOSIS — Z79891 Long term (current) use of opiate analgesic: Secondary | ICD-10-CM | POA: Diagnosis not present

## 2024-01-18 ENCOUNTER — Ambulatory Visit: Payer: Self-pay

## 2024-01-18 NOTE — Telephone Encounter (Signed)
 FYI Only or Action Required?: Action required by provider: request for appointment and update on patient condition.  Patient was last seen in primary care on 12/25/2023 by Antoniette Vermell CROME, PA-C.  Called Nurse Triage reporting Shortness of Breath.  Symptoms began several days ago.  Interventions attempted: Prescription medications: Home oxygen .  Symptoms are: gradually worsening.  Triage Disposition: See Physician Within 24 Hours  Patient/caregiver understands and will follow disposition?: No, wishes to speak with PCP Pt with hx of COPD on 3LNC reports productive cough with mild to moderate SOB, requesting virtual visit, she was already schedule for 9/29. This RN is advising in office visit, pt declines, says that her daughter is her transportation and will be out of town starting today until Monday.  Patient says she doesn't feel comfortable using an [employer] or Ly[employer] either and prefers to wait until her daughter can bring her next week. Care advice given, will forward HP to clinic for follow-up and scheduling   Copied from CRM #[id number]. Topic: Clinical - Red Word Triage >> Jan 18, 2024 10:08 AM [name] wrote: Red Word that prompted transfer to Nurse Triage: Difficulty breathing and congestion. Reason for Disposition  [1] MILD difficulty breathing (e.g., minimal/no SOB at rest, SOB with walking) AND [2] worse than normal  Answer Assessment - Initial Assessment Questions 1. MAIN CONCERN OR SYMPTOM: What's your main concern? (e.g., low oxygen  level, breathing difficulty) What question do you have?     Has had productive cough, Feels like I am not getting enough air  2.  OXYGEN  EQUIPMENT:  Are you having trouble with your oxygen  equipment?  (e.g., cannula, mask, tubing, tank, concentrator)     Home oxygen , 3lNC  3. ONSET: When did the  cough  start?      4 days ago  4. OXYGEN  THERAPY:      Yes  5. PULSE OXIMETER:      No  6. O2 MONITORING: What is the oxygen  level (pulse ox  reading)? (e.g., 70-100%)     No  7. VITAL SIGNS MONITORING: Do you monitor/measure your oxygen  level or vital signs (e.g., yes, no, measurements are automatically sent to provider/call center). Document CURRENT and NORMAL BASELINE values if available.       *No Answer* 8. BREATHING DIFFICULTY: Are you having any difficulty breathing? If Yes, ask: How bad is it?  (e.g., none, mild, moderate, severe)      Mild to moderate  9. OTHER SYMPTOMS: Do you have any other symptoms? (e.g., fever, change in sputum)     Yellow mucus  10. SMOKING: Do you smoke currently? Is there anyone that smokes around you?  (Note: smoking around oxygen  is dangerous!)       No  Protocols used: COPD Oxygen  Monitoring and Hypoxia-A-AH

## 2024-01-18 NOTE — Telephone Encounter (Signed)
 Patient has been scheduled for 01/21/24 with the provider.

## 2024-01-21 ENCOUNTER — Telehealth (INDEPENDENT_AMBULATORY_CARE_PROVIDER_SITE_OTHER): Admitting: Physician Assistant

## 2024-01-21 DIAGNOSIS — J9611 Chronic respiratory failure with hypoxia: Secondary | ICD-10-CM

## 2024-01-21 DIAGNOSIS — J441 Chronic obstructive pulmonary disease with (acute) exacerbation: Secondary | ICD-10-CM

## 2024-01-21 MED ORDER — AZITHROMYCIN 250 MG PO TABS: ORAL_TABLET | ORAL | 0 refills | Status: DC

## 2024-01-21 MED ORDER — PREDNISONE 50 MG PO TABS: ORAL_TABLET | ORAL | 0 refills | Status: DC

## 2024-01-21 NOTE — Patient Instructions (Signed)
 Chronic Obstructive Pulmonary Disease Exacerbation  Chronic obstructive pulmonary disease (COPD) is a long-term (chronic) lung problem. When you have COPD, it can feel harder to breathe in or out. COPD exacerbation is a flare-up of symptoms when breathing gets worse and more treatment may be needed. Without treatment, flare-ups can be life-threatening. If they happen often, your lungs can become more damaged. What are the causes? Not taking your usual COPD medicines as told by your health care provider. A cold or the flu, which can cause infection in your lungs. Being exposed to things that make your breathing worse, such as: Smoke. Air pollution. Fumes. Dust. Allergies. Weather changes. What are the signs or symptoms? Symptoms do not get better or get worse even if you take your medicines as told by your provider. Symptoms may include: More shortness of breath. You may only be able to speak one or two words at a time. More coughing or mucus from your lungs. More wheezing or chest tightness. Being more tired and having less energy. Confusion. How is this diagnosed? This condition is diagnosed based on: Symptoms that get worse. Your medical history. A physical exam. You may also have tests, including: A chest X-ray. Blood or mucus tests. How is this treated? You may be able to stay home or you may need to go to the hospital. Treatment may include: Taking medicines. These may include: Inhalers. These have medicines in them that you breathe in. These may be more of what you already take or they may be new. Steroids. These reduce inflammation in the airways. These may be inhaled, taken by mouth, or given in an IV. Antibiotics. These treat infection. Using oxygen. Using a device to help you clear mucus. Follow these instructions at home: Medicines Take your medicines only as told by your provider. If you were given antibiotics or steroids, take them as told by your provider. Do  not stop taking them even if you start to feel better. Lifestyle Several times a day, wash your hands with soap and water for at least 20 seconds. If you cannot use soap and water, use hand sanitizer. This may help keep you from getting an infection. Avoid being around crowds or people who are sick. Do not smoke or use any products that contain nicotine or tobacco. If you need help quitting, ask your provider. Return to your normal activities when your provider says that it's safe. Use breathing methods to control your stress and catch your breath. How is this prevented? Follow your COPD action plan. The action plan tells you what to do if you're feeling good and what to do when you start feeling worse. Discuss the plan often with your provider. Make sure you get all the shots, also called vaccines, that your provider recommends. Ask your provider about a flu shot and a pneumonia shot. Use oxygen therapy if told by your provider. If you need home oxygen therapy, ask your provider how often to check your oxygen level with a device called an oximeter. Keep all follow-up visits to review your COPD action plan. Your provider will want to check on your condition often to keep you healthy and out of the hospital. Contact a health care provider if: Your COPD symptoms get worse. You have a fever or chills. You have trouble doing daily activities. You have trouble breathing even when you are resting. Get help right away if: You are short of breath and cannot: Talk in full sentences. Do normal activities. You have chest  pain. You feel confused. These symptoms may be an emergency. Call 911 right away. Do not wait to see if the symptoms will go away. Do not drive yourself to the hospital. This information is not intended to replace advice given to you by your health care provider. Make sure you discuss any questions you have with your health care provider. Document Revised: 01/11/2023 Document  Reviewed: 06/26/2022 Elsevier Patient Education  2024 [employer].

## 2024-01-21 NOTE — Progress Notes (Unsigned)
..  Virtual Visit via Video Note  I connected with Sarah Wright on 01/22/24 at 10:10 AM EDT by a video enabled telemedicine application and verified that I am speaking with the correct person using two identifiers.  Location: Patient: home Provider: clinic  .SABRAParticipating in visit:  Patient: Sarah Wright Provider: [name] PA-C   I discussed the limitations of evaluation and management by telemedicine and the availability of in person appointments. The patient expressed understanding and agreed to proceed.  History of Present Illness: Patient is a 78 year old female with COPD and on continuous oxygen  who calls into the clinic with worsening of shortness of breath, chest tightness, sinus pressure, congestion for the last week.  She has been using her inhaler more and started using over-the-counter Tylenol  Cold sinus Devere.  She has had minimal benefit and feels like she is worsening.  She denies any fever, chills.  She has had no family contact with us .      Observations/Objective: No acute distress  On O2 at 3L Productive cough   Assessment and Plan: .Sarah Wright was seen today for cough.  Diagnoses and all orders for this visit:  COPD exacerbation (HCC) -     predniSONE  (DELTASONE ) 50 MG tablet; Take one tablet daily for 5 days. -     azithromycin  (ZITHROMAX  Z-PAK) 250 MG tablet; Take 2 tablets (500 mg) on  Day 1,  followed by 1 tablet (250 mg) once daily on Days 2 through 5.  Chronic respiratory failure with hypoxia (HCC)   Start prednisone  and zpak Continue using albuterol  every 4-6 hours as needed Continue symptomatic care Follow up as needed if symptoms persist or worsen   Follow Up Instructions:    I discussed the assessment and treatment plan with the patient. The patient was provided an opportunity to ask questions and all were answered. The patient agreed with the plan and demonstrated an understanding of the instructions.   The patient was advised to call back or  seek an in-person evaluation if the symptoms worsen or if the condition fails to improve as anticipated.  Sarah Langelier, PA-C

## 2024-01-22 ENCOUNTER — Encounter: Payer: Self-pay | Admitting: Physician Assistant

## 2024-01-22 DIAGNOSIS — E1142 Type 2 diabetes mellitus with diabetic polyneuropathy: Secondary | ICD-10-CM | POA: Diagnosis not present

## 2024-01-22 DIAGNOSIS — G894 Chronic pain syndrome: Secondary | ICD-10-CM | POA: Diagnosis not present

## 2024-01-22 DIAGNOSIS — Z9981 Dependence on supplemental oxygen: Secondary | ICD-10-CM | POA: Diagnosis not present

## 2024-01-22 DIAGNOSIS — Z79891 Long term (current) use of opiate analgesic: Secondary | ICD-10-CM | POA: Diagnosis not present

## 2024-01-22 DIAGNOSIS — I951 Orthostatic hypotension: Secondary | ICD-10-CM | POA: Diagnosis not present

## 2024-01-22 DIAGNOSIS — Z7982 Long term (current) use of aspirin: Secondary | ICD-10-CM | POA: Diagnosis not present

## 2024-01-23 DIAGNOSIS — Z1330 Encounter for screening examination for mental health and behavioral disorders, unspecified: Secondary | ICD-10-CM | POA: Diagnosis not present

## 2024-01-23 DIAGNOSIS — G2581 Restless legs syndrome: Secondary | ICD-10-CM | POA: Diagnosis not present

## 2024-01-30 DIAGNOSIS — H35033 Hypertensive retinopathy, bilateral: Secondary | ICD-10-CM | POA: Diagnosis not present

## 2024-01-30 DIAGNOSIS — Z961 Presence of intraocular lens: Secondary | ICD-10-CM | POA: Diagnosis not present

## 2024-01-30 DIAGNOSIS — H353133 Nonexudative age-related macular degeneration, bilateral, advanced atrophic without subfoveal involvement: Secondary | ICD-10-CM | POA: Diagnosis not present

## 2024-01-30 DIAGNOSIS — H35372 Puckering of macula, left eye: Secondary | ICD-10-CM | POA: Diagnosis not present

## 2024-01-30 DIAGNOSIS — H43811 Vitreous degeneration, right eye: Secondary | ICD-10-CM | POA: Diagnosis not present

## 2024-01-31 DIAGNOSIS — Z9181 History of falling: Secondary | ICD-10-CM | POA: Diagnosis not present

## 2024-01-31 DIAGNOSIS — G894 Chronic pain syndrome: Secondary | ICD-10-CM | POA: Diagnosis not present

## 2024-01-31 DIAGNOSIS — Z7982 Long term (current) use of aspirin: Secondary | ICD-10-CM | POA: Diagnosis not present

## 2024-01-31 DIAGNOSIS — I951 Orthostatic hypotension: Secondary | ICD-10-CM | POA: Diagnosis not present

## 2024-01-31 DIAGNOSIS — Z9981 Dependence on supplemental oxygen: Secondary | ICD-10-CM | POA: Diagnosis not present

## 2024-01-31 DIAGNOSIS — Z79891 Long term (current) use of opiate analgesic: Secondary | ICD-10-CM | POA: Diagnosis not present

## 2024-01-31 DIAGNOSIS — Z604 Social exclusion and rejection: Secondary | ICD-10-CM | POA: Diagnosis not present

## 2024-01-31 DIAGNOSIS — E1142 Type 2 diabetes mellitus with diabetic polyneuropathy: Secondary | ICD-10-CM | POA: Diagnosis not present

## 2024-02-06 DIAGNOSIS — G894 Chronic pain syndrome: Secondary | ICD-10-CM | POA: Diagnosis not present

## 2024-02-06 DIAGNOSIS — Z9981 Dependence on supplemental oxygen: Secondary | ICD-10-CM | POA: Diagnosis not present

## 2024-02-06 DIAGNOSIS — Z7982 Long term (current) use of aspirin: Secondary | ICD-10-CM | POA: Diagnosis not present

## 2024-02-06 DIAGNOSIS — I951 Orthostatic hypotension: Secondary | ICD-10-CM | POA: Diagnosis not present

## 2024-02-06 DIAGNOSIS — E1142 Type 2 diabetes mellitus with diabetic polyneuropathy: Secondary | ICD-10-CM | POA: Diagnosis not present

## 2024-02-06 DIAGNOSIS — Z79891 Long term (current) use of opiate analgesic: Secondary | ICD-10-CM | POA: Diagnosis not present

## 2024-02-11 DIAGNOSIS — E1142 Type 2 diabetes mellitus with diabetic polyneuropathy: Secondary | ICD-10-CM | POA: Diagnosis not present

## 2024-02-11 DIAGNOSIS — Z7982 Long term (current) use of aspirin: Secondary | ICD-10-CM | POA: Diagnosis not present

## 2024-02-11 DIAGNOSIS — G894 Chronic pain syndrome: Secondary | ICD-10-CM | POA: Diagnosis not present

## 2024-02-11 DIAGNOSIS — I951 Orthostatic hypotension: Secondary | ICD-10-CM | POA: Diagnosis not present

## 2024-02-11 DIAGNOSIS — Z79891 Long term (current) use of opiate analgesic: Secondary | ICD-10-CM | POA: Diagnosis not present

## 2024-02-11 DIAGNOSIS — Z9981 Dependence on supplemental oxygen: Secondary | ICD-10-CM | POA: Diagnosis not present

## 2024-02-13 ENCOUNTER — Other Ambulatory Visit: Payer: Self-pay | Admitting: Physician Assistant

## 2024-02-13 DIAGNOSIS — F5101 Primary insomnia: Secondary | ICD-10-CM

## 2024-02-19 DIAGNOSIS — Z9981 Dependence on supplemental oxygen: Secondary | ICD-10-CM | POA: Diagnosis not present

## 2024-02-19 DIAGNOSIS — I951 Orthostatic hypotension: Secondary | ICD-10-CM | POA: Diagnosis not present

## 2024-02-19 DIAGNOSIS — G894 Chronic pain syndrome: Secondary | ICD-10-CM | POA: Diagnosis not present

## 2024-02-19 DIAGNOSIS — Z79891 Long term (current) use of opiate analgesic: Secondary | ICD-10-CM | POA: Diagnosis not present

## 2024-02-19 DIAGNOSIS — Z7982 Long term (current) use of aspirin: Secondary | ICD-10-CM | POA: Diagnosis not present

## 2024-02-19 DIAGNOSIS — E1142 Type 2 diabetes mellitus with diabetic polyneuropathy: Secondary | ICD-10-CM | POA: Diagnosis not present

## 2024-02-21 DIAGNOSIS — E538 Deficiency of other specified B group vitamins: Secondary | ICD-10-CM | POA: Diagnosis not present

## 2024-02-21 DIAGNOSIS — G2581 Restless legs syndrome: Secondary | ICD-10-CM | POA: Diagnosis not present

## 2024-02-27 ENCOUNTER — Other Ambulatory Visit: Payer: Self-pay | Admitting: Physician Assistant

## 2024-02-27 DIAGNOSIS — M25512 Pain in left shoulder: Secondary | ICD-10-CM

## 2024-02-28 DIAGNOSIS — H353123 Nonexudative age-related macular degeneration, left eye, advanced atrophic without subfoveal involvement: Secondary | ICD-10-CM | POA: Diagnosis not present

## 2024-02-28 DIAGNOSIS — H35033 Hypertensive retinopathy, bilateral: Secondary | ICD-10-CM | POA: Diagnosis not present

## 2024-02-28 DIAGNOSIS — H35372 Puckering of macula, left eye: Secondary | ICD-10-CM | POA: Diagnosis not present

## 2024-02-28 DIAGNOSIS — H43811 Vitreous degeneration, right eye: Secondary | ICD-10-CM | POA: Diagnosis not present

## 2024-02-28 DIAGNOSIS — Z961 Presence of intraocular lens: Secondary | ICD-10-CM | POA: Diagnosis not present

## 2024-03-01 ENCOUNTER — Other Ambulatory Visit: Payer: Self-pay | Admitting: Physician Assistant

## 2024-03-01 DIAGNOSIS — R110 Nausea: Secondary | ICD-10-CM

## 2024-03-09 ENCOUNTER — Ambulatory Visit
Admission: EM | Admit: 2024-03-09 | Discharge: 2024-03-09 | Disposition: A | Discharge status: Home / Self Care | Attending: Family Medicine | Admitting: Family Medicine

## 2024-03-09 ENCOUNTER — Other Ambulatory Visit: Payer: Self-pay

## 2024-03-09 DIAGNOSIS — K047 Periapical abscess without sinus: Secondary | ICD-10-CM

## 2024-03-09 DIAGNOSIS — R220 Localized swelling, mass and lump, head: Secondary | ICD-10-CM | POA: Diagnosis not present

## 2024-03-09 MED ORDER — AMOXICILLIN-POT CLAVULANATE 875-125 MG PO TABS: 1.0000 | ORAL_TABLET | Freq: Two times a day (BID) | ORAL | 0 refills | Status: DC

## 2024-03-09 MED ORDER — PREDNISONE 50 MG PO TABS: ORAL_TABLET | ORAL | 0 refills | Status: DC

## 2024-03-09 NOTE — ED Triage Notes (Signed)
 Has c/o right upper dental pain x couple of days. Reports infection is going into face, face has been swelling. Reports her bp trends low. No fever.

## 2024-03-09 NOTE — ED Provider Notes (Signed)
 [facility]    CSN: [id number] Arrival date & time: 03/09/24  1541      History   Chief Complaint Chief Complaint  Patient presents with   Dental Pain   Facial Swelling    HPI Sarah Wright is a 78 y.o. female.   HPI 78 year old female presents with right upper dental pain with right facial swelling for 2 days.  Swollen face.  PMH significant for obesity T2DM without complication, orthostatic hypotension, and COPD.  Past Medical History:  Diagnosis Date   Diabetes mellitus without complication (HCC)    Hyperlipidemia    Orthostatic hypotension    Thyroid  disease     Patient Active Problem List   Diagnosis Date Noted   Weakness of both lower extremities 12/26/2023   Iron deficiency anemia secondary to inadequate dietary iron intake 03/26/2023   Hypotension 03/26/2023   Incontinence of feces 12/15/2022   Itching 10/16/2022   Chronic respiratory failure with hypoxia (HCC) 02/20/2022   GAD (generalized anxiety disorder) 02/20/2022   Fall 12/19/2021   Fatty liver disease, nonalcoholic 11/11/2021   Left adrenal mass 11/08/2021   SOB (shortness of breath) on exertion 10/26/2021   Hypoxia 10/26/2021   Lung crackles 10/26/2021   Pain management 07/26/2021   Callus of toe 07/15/2021   Bilateral impacted cerumen 06/14/2021   Orthostatic hypotension 03/22/2021   Severe episode of recurrent major depressive disorder, without psychotic features (HCC) 08/20/2020   Excessive crying 08/20/2020   Right ankle pain 01/20/2020   COPD exacerbation (HCC) 08/14/2019   RLS (restless legs syndrome) 08/12/2018   Skin abrasion 08/01/2017   Leukocytes in urine 08/01/2017   Dysuria 08/01/2017   Panic attack 08/01/2017   Chronic pain syndrome 05/06/2017   Microalbuminuria 01/30/2017   DDD (degenerative disc disease), cervical 12/29/2016   Numbness around mouth 09/08/2016   Balance problems 08/13/2016   Acute pain of left shoulder 08/13/2016   Right shoulder injury  01/27/2016   Psoriasis 09/23/2015   Memory loss 07/29/2015   Thyroid  nodule 06/22/2015   Sensorineural hearing loss of both ears 01/26/2015   Vertigo 01/04/2015   Class 3 severe obesity due to excess calories with serious comorbidity and body mass index (BMI) of 40.0 to 44.9 in adult (HCC) 10/07/2014   Primary osteoarthritis of both knees 07/21/2014   H/O colonoscopy 04/02/2014   Thyroid  disease 04/02/2014   Cataract 04/02/2014   Type 2 diabetes mellitus (HCC) 04/02/2014   Chronic venous insufficiency 03/27/2014   Lumbar spondylosis 03/17/2014   Aortic atherosclerosis (HCC) 03/17/2014   Anxiety and depression 03/16/2014   Dyslipidemia, goal LDL below 70 03/16/2014   COLD (chronic obstructive lung disease) (HCC) 03/16/2014   Insomnia 03/16/2014   Esophageal reflux 03/16/2014   Bilateral hand pain 03/16/2014   DDD (degenerative disc disease), lumbar 03/16/2014    Past Surgical History:  Procedure Laterality Date   ABDOMINAL HYSTERECTOMY     Adrenal mass resection     CARPAL TUNNEL RELEASE     both wrists   CATARACT EXTRACTION, BILATERAL     CERVICAL FUSION     GALLBLADDER SURGERY     right foot surgery     THYROIDECTOMY, PARTIAL     right side removed    OB History   No obstetric history on file.      Home Medications    Prior to Admission medications   Medication Sig Start Date End Date Taking? Authorizing Provider  amoxicillin -clavulanate (AUGMENTIN ) 875-125 MG tablet Take 1 tablet by mouth every 12 (  twelve) hours. 03/09/24  Yes Teddy Sharper, FNP  predniSONE  (DELTASONE ) 50 MG tablet Take 1 tab p.o. daily for 5 days. 03/09/24  Yes Teddy Sharper, FNP  aspirin 81 MG tablet Take 81 mg by mouth daily.    [provider]  atorvastatin  (LIPITOR) 20 MG tablet TAKE 1 TABLET BY MOUTH EVERY DAY 01/09/24   Breeback, Jade L, PA-C  BREZTRI  AEROSPHERE 160-9-4.8 MCG/ACT AERO INHALE 2 PUFFS INTO THE LUNGS IN THE MORNING AND AT BEDTIME. 02/13/22   McQuaid, Douglas B, MD   buPROPion  (WELLBUTRIN  XL) 150 MG 24 hr tablet TAKE 2 TABLETS BY MOUTH EVERY DAY 10/31/23   Breeback, Jade L, PA-C  DULoxetine  (CYMBALTA ) 60 MG capsule TAKE 1 CAPSULE BY MOUTH TWICE A DAY 10/31/23   Breeback, Jade L, PA-C  fluticasone  (FLONASE ) 50 MCG/ACT nasal spray SPRAY 2 SPRAYS INTO EACH NOSTRIL EVERY DAY 05/20/22   Blair, Diane W, FNP  hydrOXYzine  (VISTARIL ) 25 MG capsule Take 1-2 tablets as needed up to three times a day for anxiety/panic. 09/24/23   Breeback, Jade L, PA-C  ipratropium-albuterol  (DUONEB) 0.5-2.5 (3) MG/3ML SOLN Take 3 mLs by nebulization every 4 (four) hours as needed. 08/02/22   Breeback, Jade L, PA-C  levalbuterol (XOPENEX HFA) 45 MCG/ACT inhaler INHALE 1 TO 2 PUFFS BY MOUTH EVERY 6 HOURS AS NEEDED FOR WHEEZE 05/15/22   Breeback, Jade L, PA-C  meloxicam  (MOBIC ) 15 MG tablet TAKE 1 TABLET (15 MG TOTAL) BY MOUTH DAILY. 03/04/24   Breeback, Jade L, PA-C  midodrine  (PROAMATINE ) 10 MG tablet Take 1 tablet (10 mg total) by mouth 3 (three) times daily. 12/25/23   Breeback, Jade L, PA-C  montelukast  (SINGULAIR ) 10 MG tablet TAKE 1 TABLET BY MOUTH EVERYDAY AT BEDTIME 08/23/23   Breeback, Jade L, PA-C  OLANZapine  (ZYPREXA ) 10 MG tablet TAKE 1 TABLET BY MOUTH EVERYDAY AT BEDTIME 01/09/24   Breeback, Jade L, PA-C  ondansetron  (ZOFRAN ) 8 MG tablet TAKE 1 TABLET BY MOUTH EVERY 8 HOURS AS NEEDED FOR NAUSEA AND VOMITING 03/05/24   Breeback, Jade L, PA-C  oxyCODONE  (ROXICODONE ) 15 MG immediate release tablet Take 1 tablet (15 mg total) by mouth every 8 (eight) hours as needed for pain. 02/23/24 03/24/24  Breeback, Jade L, PA-C  pramipexole  (MIRAPEX ) 0.5 MG tablet TAKE ONE TABLET BY MOUTH 2-3 HOURS BEFORE BEDTIME. 02/14/24   Breeback, Jade L, PA-C  primidone  (MYSOLINE ) 50 MG tablet TAKE 1 TABLET BY MOUTH EVERYDAY AT BEDTIME 10/31/23   Breeback, Jade L, PA-C  tirzepatide  (MOUNJARO ) 10 MG/0.5ML Pen Inject 10 mg into the skin once a week. 12/25/23   Breeback, Jade L, PA-C  traZODone  (DESYREL ) 100 MG tablet TAKE 1  TABLET BY MOUTH EVERYDAY AT BEDTIME 02/14/24   Breeback, Jade L, PA-C    Family History Family History  Problem Relation Age of Onset   Depression Mother    Heart attack Father    Diabetes Father    Uterine cancer Other    Pancreatic cancer Other    Hypertension Other        parents    Social History Social History   Tobacco Use   Smoking status: Former    Current packs/day: 0.00    Average packs/day: 1 pack/day for 54.4 years (54.4 ttl pk-yrs)    Types: Cigarettes    Start date: 09/24/1959    Quit date: 02/12/2014    Years since quitting: 10.0   Smokeless tobacco: Never  Vaping Use   Vaping status: Never Used  Substance Use Topics  Alcohol use: No    Alcohol/week: 0.0 standard drinks of alcohol   Drug use: No     Allergies   Molnupiravir , Gabapentin , and Sulfa antibiotics   Review of Systems Review of Systems  HENT:  Positive for dental problem.   All other systems reviewed and are negative.    Physical Exam Triage Vital Signs ED Triage Vitals  Encounter Vitals Group     BP      Girls Systolic BP Percentile      Girls Diastolic BP Percentile      Boys Systolic BP Percentile      Boys Diastolic BP Percentile      Pulse      Resp      Temp      Temp src      SpO2      Weight      Height      Head Circumference      Peak Flow      Pain Score      Pain Loc      Pain Education      Exclude from Growth Chart    No data found.  Updated Vital Signs BP (!) 104/56 (BP Location: Right Arm)   Pulse 94   Temp 98.2 F (36.8 C)   Resp 16   SpO2 94%   Visual Acuity Right Eye Distance:   Left Eye Distance:   Bilateral Distance:    Right Eye Near:   Left Eye Near:    Bilateral Near:     Physical Exam Vitals and nursing note reviewed.  Constitutional:      Appearance: Normal appearance. She is obese. She is not ill-appearing.  HENT:     Head: Normocephalic and atraumatic.     Right Ear: Tympanic membrane, ear canal and external ear normal.      Left Ear: Tympanic membrane, ear canal and external ear normal.     Mouth/Throat:     Mouth: Mucous membranes are moist.     Pharynx: Oropharynx is clear.     Comments: Upper mandible (right sided): Second bicuspid (#4) erythematous gingival lining/mucosa/mucosa noted  Eyes:     Extraocular Movements: Extraocular movements intact.     Conjunctiva/sclera: Conjunctivae normal.     Pupils: Pupils are equal, round, and reactive to light.  Cardiovascular:     Pulses: Normal pulses.     Heart sounds: Normal heart sounds.  Pulmonary:     Effort: Pulmonary effort is normal.     Breath sounds: Normal breath sounds. No wheezing, rhonchi or rales.  Musculoskeletal:        General: Normal range of motion.     Cervical back: Normal range of motion and neck supple.  Skin:    General: Skin is warm and dry.  Neurological:     General: No focal deficit present.     Mental Status: She is alert and oriented to person, place, and time. Mental status is at baseline.  Psychiatric:        Mood and Affect: Mood normal.        Behavior: Behavior normal.      UC Treatments / Results  Labs (all labs ordered are listed, but only abnormal results are displayed) Labs Reviewed - No data to display  EKG   Radiology No results found.  Procedures Procedures (including critical care time)  Medications Ordered in UC Medications - No data to display  Initial Impression / Assessment and Plan /  UC Course  I have reviewed the triage vital signs and the nursing notes.  Pertinent labs & imaging results that were available during my care of the patient were reviewed by me and considered in my medical decision making (see chart for details).     MDM: 1.  Dental abscess-Rx'd Augmentin  875/125 mg tablet: Take 1 tablet twice daily x 7 days; 2.  Facial swelling-Rx'd prednisone  50 mg tablet: Take 1 tablet p.o. daily x 5 days. Advised patient take medication as directed with food to completion.  Advised to  take prednisone  with first dose of Augmentin  for the next 5 of 7 days.  Encouraged increase daily water intake to 64 ounces per day.  Advised if symptoms worsen and/or unresolved please follow-up with your dentist, PCP or here for further evaluation.  Patient discharged home, hemodynamically stable. Final Clinical Impressions(s) / UC Diagnoses   Final diagnoses:  Facial swelling  Dental abscess     Discharge Instructions      Advised patient take medication as directed with food to completion.  Advised to take prednisone  with first dose of Augmentin  for the next 5 of 7 days.  Encouraged increase daily water intake to 64 ounces per day.  Advised if symptoms worsen and/or unresolved please follow-up with your dentist, PCP or here for further evaluation.     ED Prescriptions     Medication Sig Dispense Auth. Provider   predniSONE  (DELTASONE ) 50 MG tablet Take 1 tab p.o. daily for 5 days. 5 tablet Dwayna Kentner, FNP   amoxicillin -clavulanate (AUGMENTIN ) 875-125 MG tablet Take 1 tablet by mouth every 12 (twelve) hours. 14 tablet Jeison Delpilar, FNP      I have reviewed the PDMP during this encounter.   Teddy Sharper, FNP 03/09/24 1650

## 2024-03-09 NOTE — Discharge Instructions (Addendum)
 Advised patient take medication as directed with food to completion.  Advised to take prednisone  with first dose of Augmentin  for the next 5 of 7 days.  Encouraged increase daily water intake to 64 ounces per day.  Advised if symptoms worsen and/or unresolved please follow-up with your dentist, PCP or here for further evaluation.

## 2024-03-11 DIAGNOSIS — M205X1 Other deformities of toe(s) (acquired), right foot: Secondary | ICD-10-CM | POA: Diagnosis not present

## 2024-03-11 DIAGNOSIS — M216X1 Other acquired deformities of right foot: Secondary | ICD-10-CM | POA: Diagnosis not present

## 2024-03-11 DIAGNOSIS — I872 Venous insufficiency (chronic) (peripheral): Secondary | ICD-10-CM | POA: Diagnosis not present

## 2024-03-11 DIAGNOSIS — L84 Corns and callosities: Secondary | ICD-10-CM | POA: Diagnosis not present

## 2024-03-11 DIAGNOSIS — B351 Tinea unguium: Secondary | ICD-10-CM | POA: Diagnosis not present

## 2024-03-11 DIAGNOSIS — L851 Acquired keratosis [keratoderma] palmaris et plantaris: Secondary | ICD-10-CM | POA: Diagnosis not present

## 2024-03-11 DIAGNOSIS — I739 Peripheral vascular disease, unspecified: Secondary | ICD-10-CM | POA: Diagnosis not present

## 2024-03-11 DIAGNOSIS — M2042 Other hammer toe(s) (acquired), left foot: Secondary | ICD-10-CM | POA: Diagnosis not present

## 2024-03-11 DIAGNOSIS — L97512 Non-pressure chronic ulcer of other part of right foot with fat layer exposed: Secondary | ICD-10-CM | POA: Diagnosis not present

## 2024-03-11 DIAGNOSIS — M2041 Other hammer toe(s) (acquired), right foot: Secondary | ICD-10-CM | POA: Diagnosis not present

## 2024-03-23 ENCOUNTER — Encounter: Payer: Self-pay | Admitting: Physician Assistant

## 2024-03-24 ENCOUNTER — Other Ambulatory Visit: Payer: Self-pay | Admitting: Physician Assistant

## 2024-03-24 ENCOUNTER — Ambulatory Visit: Payer: Self-pay

## 2024-03-24 NOTE — Telephone Encounter (Signed)
 FYI Only or Action Required?: Action required by provider: update on patient condition. Appt with Vermell 12/3-   Patient was last seen in primary care on 01/21/2024 by Antoniette Vermell CROME, PA-C.  Called Nurse Triage reporting Fall and Leg Swelling.  Symptoms began several weeks ago.  Interventions attempted: Rest, hydration, or home remedies.  Symptoms are: gradually worsening.  Triage Disposition: See Physician Within 24 Hours  Patient/caregiver understands and will follow disposition?: Yes  Copied from CRM #[id number]. Topic: Clinical - Red Word Triage >> Mar 24, 2024 12:09 PM [name] wrote: Red Word that prompted transfer to Nurse Triage: Had a fall two weeks ago and having swelling in legs. Reason for Disposition  [1] MODERATE leg swelling (e.g., swelling extends up to knees) AND [2] new-onset or getting worse  Answer Assessment - Initial Assessment Questions Sarah Wright onto right side 2 weeks ago- Ankle hip, knee, ankle- fall from tripping over something and twisting with her walker. Denies hitting head- not on blood thinner Since has had bilateral swelling- L>R- Feels like skin is stretching when she walks. Denies color change, temp change, worsened SOB, CP, or dizziness. Denies hot hard knot.  Appt with PCP per pt request Weds 12/3- ED/UC advised and understood 1. ONSET: When did the swelling start? (e.g., minutes, hours, days)     2 weeks ago  2. LOCATION: What part of the leg is swollen?  Are both legs swollen or just one leg?     Bilateral legs swollen- left >right- even though she fell on right side  3. SEVERITY: How bad is the swelling? (e.g., localized; mild, moderate, severe)     Skin feels tight when she walks- visibly swollen 4. REDNESS: Is there redness or signs of infection?     denies 5. PAIN: Is the swelling painful to touch? If Yes, ask: How painful is it?   (Scale 1-10; mild, moderate or severe)     5/10- more when up and about  6. FEVER: Do you have a  fever? If Yes, ask: What is it, how was it measured, and when did it start?      denies 7. CAUSE: What do you think is causing the leg swelling?     Unsure if due to fall  8. MEDICAL HISTORY: Do you have a history of blood clots (e.g., DVT), cancer, heart failure, kidney disease, or liver failure?     Hypotension 9. RECURRENT SYMPTOM: Have you had leg swelling before? If Yes, ask: When was the last time? What happened that time?     Not that she can remember- was put on Lasix  at one time but doesn't remember circumstances 10. OTHER SYMPTOMS: Do you have any other symptoms? (e.g., chest pain, difficulty breathing)       Sob at Baseline- not worsened  Protocols used: Leg Swelling and Edema-A-AH

## 2024-03-24 NOTE — Telephone Encounter (Signed)
 Patient scheduled for 03/26/2024 with [name], PA

## 2024-03-26 ENCOUNTER — Ambulatory Visit

## 2024-03-26 ENCOUNTER — Ambulatory Visit: Payer: Self-pay | Admitting: Physician Assistant

## 2024-03-26 ENCOUNTER — Ambulatory Visit: Admitting: Physician Assistant

## 2024-03-26 ENCOUNTER — Encounter: Payer: Self-pay | Admitting: Physician Assistant

## 2024-03-26 VITALS — BP 118/67 | HR 67 | Ht 64.0 in

## 2024-03-26 DIAGNOSIS — M160 Bilateral primary osteoarthritis of hip: Secondary | ICD-10-CM | POA: Diagnosis not present

## 2024-03-26 DIAGNOSIS — M25551 Pain in right hip: Secondary | ICD-10-CM | POA: Diagnosis not present

## 2024-03-26 DIAGNOSIS — M25562 Pain in left knee: Secondary | ICD-10-CM

## 2024-03-26 DIAGNOSIS — W19XXXA Unspecified fall, initial encounter: Secondary | ICD-10-CM

## 2024-03-26 DIAGNOSIS — M1712 Unilateral primary osteoarthritis, left knee: Secondary | ICD-10-CM | POA: Diagnosis not present

## 2024-03-26 DIAGNOSIS — M79662 Pain in left lower leg: Secondary | ICD-10-CM | POA: Diagnosis not present

## 2024-03-26 DIAGNOSIS — M7989 Other specified soft tissue disorders: Secondary | ICD-10-CM | POA: Insufficient documentation

## 2024-03-26 DIAGNOSIS — M7122 Synovial cyst of popliteal space [Baker], left knee: Secondary | ICD-10-CM | POA: Insufficient documentation

## 2024-03-26 DIAGNOSIS — M79605 Pain in left leg: Secondary | ICD-10-CM | POA: Diagnosis not present

## 2024-03-26 DIAGNOSIS — M47816 Spondylosis without myelopathy or radiculopathy, lumbar region: Secondary | ICD-10-CM | POA: Diagnosis not present

## 2024-03-26 DIAGNOSIS — R600 Localized edema: Secondary | ICD-10-CM | POA: Diagnosis not present

## 2024-03-26 NOTE — Progress Notes (Signed)
 Acute Office Visit  Subjective:     Patient ID: Sarah Wright, female    DOB: 03/14/1946, 78 y.o.   MRN: [id number]  Chief Complaint  Patient presents with   Fall    HPI .SABRADiscussed the use of AI scribe software for clinical note transcription with the patient, who gave verbal consent to proceed.  History of Present Illness Sarah Wright is an 78 year old female who presents with left leg swelling and pain following a fall two weeks ago. Her daughter accompanies her to visit.   Left lower extremity swelling and pain - Swelling and pain in the left leg began a few days after a fall two weeks ago - Swelling described as 'pretty tight' and more pronounced on the left side - Knee is particularly painful - Ambulation is slow due to pain in the foot and hip - Able to move feet up and down, though movement feels like everything is stretching - Heating pad used for symptom relief; no use of ice  History of fall and associated trauma - [place] two weeks ago after foot got caught on a wheel, landing on cement - Landed on right side, but left leg is now swollen and painful - right Hip was bruised and swollen after the fall and remains swollen and painful - Left knee is particularly painful  Respiratory symptoms - Increased difficulty breathing over the past couple of days - Cough present  Abdominal discomfort - Abdomen feels tight     ROS See HPI.      Objective:    BP 118/67   Pulse 67   Ht 5' 4 (1.626 m)   SpO2 99%   BMI 31.41 kg/m  BP Readings from Last 3 Encounters:  03/26/24 118/67  03/09/24 (!) 104/56  12/25/23 (!) 110/47   Wt Readings from Last 3 Encounters:  01/08/24 183 lb (83 kg)  12/25/23 183 lb (83 kg)  09/24/23 180 lb (81.6 kg)      Physical Exam Constitutional:      Appearance: She is obese.     Comments: In wheelchair with continuous O2  HENT:     Head: Normocephalic.  Cardiovascular:     Rate and Rhythm: Normal rate and regular rhythm.      Heart sounds: Murmur heard.  Pulmonary:     Effort: Pulmonary effort is normal.     Breath sounds: Normal breath sounds.  Musculoskeletal:     Right lower leg: Edema present.     Left lower leg: Edema present.     Comments: Left more swollen that right with pitting edema. Her left leg is tender to touch. No erythema or bruising. Positive homans sign on left. Her left leg is tight with swelling up into left thigh. ROM of left knee is difficult due to swelling and pain.   Neurological:     General: No focal deficit present.     Mental Status: She is alert and oriented to person, place, and time.  Psychiatric:        Mood and Affect: Mood normal.          Assessment & Plan:  SABRASABRALuanne was seen today for fall.  Diagnoses and all orders for this visit:  Fall, initial encounter -     US  Venous Img Lower Unilateral Left (DVT) -     DG Knee 4 Views W/Patella Left; Future -     DG Hip Unilat W OR W/O Pelvis 2-3 Views Right; Future -  Ambulatory referral to Sports Medicine  Pain and swelling of left lower leg -     US  Venous Img Lower Unilateral Left (DVT) -     DG Knee 4 Views W/Patella Left; Future -     DG Hip Unilat W OR W/O Pelvis 2-3 Views Right; Future -     Ambulatory referral to Sports Medicine  Acute pain of left knee -     DG Knee 4 Views W/Patella Left; Future -     Ambulatory referral to Sports Medicine  Right hip pain -     DG Hip Unilat W OR W/O Pelvis 2-3 Views Right; Future -     Ambulatory referral to Sports Medicine  Primary osteoarthritis of left knee -     Ambulatory referral to Sports Medicine  Synovial cyst of left popliteal space -     Ambulatory referral to Sports Medicine   Assessment & Plan Left lower extremity pain and swelling after fall, rule out deep vein thrombosis Increased risk of DVT due to fall. Swelling suggests DVT versus soft tissue injury. - Ordered Doppler ultrasound of the left leg to rule out DVT. - Advised leg elevation to  reduce swelling. - Avoid diuretics due to risk of hypotension and increased fall risk. - STAT doppler showed no DVT.  - START with compression and elevation  Right hip pain and swelling after fall Tenderness suggests possible hip fracture or bursitis. - Ordered bilateral hip X-ray to rule out fracture. - Will consider sports medicine referral for possible injections if no fracture is found.  Left knee pain and swelling after fall Fluid accumulation likely contributing to pain. Differential includes soft tissue injury versus other knee pathology. - Ordered X-ray of the left knee to assess for fractures. - Advised use of ice to reduce swelling. - Will consider orthopedic referral for possible injections if no fractures are found. - Bakers cysts and lots of arthritis- referral placed for Dr. Charles.   Chronic obstructive pulmonary disease (COPD) No acute exacerbation noted. - Continue current management with Albuterol  as needed. - Continue with 02 for chronic respiratory failure.   Chronic respiratory failure with hypoxia No acute changes noted. - Continue current management.    Jennings Stirling, PA-C

## 2024-03-26 NOTE — Progress Notes (Signed)
 Mild arthritis in hip. No acute findings. Will get you in with sports medicine to consider injection planning for hip/knee and possible drainage on bakers cyst seen behind left knee.

## 2024-03-26 NOTE — Progress Notes (Signed)
 Lots of arthritis in left knee. No acute findings.

## 2024-03-26 NOTE — Progress Notes (Signed)
 No blood clot. GREAT news. You do have a cyst behind left knee from fluid likely due to trauma and arthritis. Get your left leg in good compression all the way to the thigh and keep leg elevated. Urgent referral made to sports medicine.

## 2024-03-27 NOTE — Progress Notes (Signed)
 Patient daughter Heron informed of results and states patient has upcoming appt scheduled with sports medicine on Monday 12/08/225

## 2024-03-27 NOTE — Progress Notes (Signed)
 Patient daughter Heron informed of results and recommendations of compression stockings up to thigh and keeping leg elevated - states patient has upcoming appt scheduled with sports medicine on Monday 12/08/225

## 2024-03-30 NOTE — Progress Notes (Deleted)
   Subjective:    Patient ID: Sarah Wright, female    DOB: 78 y.o., 1945/10/22   MRN: [id number]  Chief Complaint: Fall, multiple joint pains.  Discussed the use of AI scribe software for clinical note transcription with the patient, who gave verbal consent to proceed.  History of Present Illness    Review of pertinent imaging: 5 view plain film radiographs obtained the left knee on 03/26/2024 per my independent review revealing significant osteophytic change noted tricompartmentally.  Moderate to severe joint space narrowing medially on regular AP view.  Suspect this would be worse with Sarah Wright view.    3 view plain film radiograph obtained of the right hip on 03/26/2024 osteophytic change of the right femoral acetabular joint with what appears to be sclerotic change around the inferior portion of the capsule.  No obvious fracture or acute osseous process.     Objective:   There were no vitals filed for this visit.  ***     Assessment & Plan:   Assessment & Plan

## 2024-03-31 ENCOUNTER — Ambulatory Visit

## 2024-04-01 ENCOUNTER — Ambulatory Visit (HOSPITAL_BASED_OUTPATIENT_CLINIC_OR_DEPARTMENT_OTHER): Admission: RE | Admit: 2024-04-01 | Discharge: 2024-04-01 | Disposition: A | Source: Ambulatory Visit

## 2024-04-01 ENCOUNTER — Ambulatory Visit

## 2024-04-01 ENCOUNTER — Other Ambulatory Visit: Payer: Self-pay

## 2024-04-01 VITALS — BP 120/62 | Ht 64.0 in | Wt 183.0 lb

## 2024-04-01 DIAGNOSIS — M1611 Unilateral primary osteoarthritis, right hip: Secondary | ICD-10-CM | POA: Diagnosis not present

## 2024-04-01 DIAGNOSIS — M1712 Unilateral primary osteoarthritis, left knee: Secondary | ICD-10-CM | POA: Diagnosis not present

## 2024-04-01 DIAGNOSIS — M79672 Pain in left foot: Secondary | ICD-10-CM

## 2024-04-01 DIAGNOSIS — M19072 Primary osteoarthritis, left ankle and foot: Secondary | ICD-10-CM

## 2024-04-01 DIAGNOSIS — T148XXA Other injury of unspecified body region, initial encounter: Secondary | ICD-10-CM

## 2024-04-01 MED ORDER — NAPROXEN 500 MG PO TABS: 500.0000 mg | ORAL_TABLET | Freq: Two times a day (BID) | ORAL | 0 refills | Status: DC

## 2024-04-01 MED ORDER — METHYLPREDNISOLONE ACETATE 40 MG/ML IJ SUSP
40.0000 mg | Freq: Once | INTRAMUSCULAR | Status: AC
  Administered 2024-04-01: 40 mg via INTRA_ARTICULAR

## 2024-04-01 NOTE — Addendum Note (Signed)
 Addended by: CHARLES ROGUE A on: 04/01/2024 12:09 PM   Modules accepted: Orders

## 2024-04-01 NOTE — Progress Notes (Addendum)
 Subjective:    Patient ID: Sarah Wright, female    DOB: 78 y.o., [birth year]   MRN: [id number]  Chief Complaint: Fall, multiple joint pains.  Discussed the use of AI scribe software for clinical note transcription with the patient, who gave verbal consent to proceed.  History of Present Illness Sarah Wright is an 78 year old female with arthritis who presents with persistent pain and swelling in the left knee and hip following a fall. She is accompanied by her daughter, Sarah Wright.  Left knee and Right hip pain and swelling - Persistent pain and swelling in the left knee and right hip since a fall outside an urgent care facility - Pain radiates down the right leg toward right knee as well - Pain is worse with weight-bearing particularly in the left knee - Oxycodone  provides only slight relief - Applies Voltaren gel to the left knee - Known left knee and right hip arthritis, worsened since the fall - Significant bruising present over the right lateral thigh since fall - Fall sounds mechanical in nature  Left foot swelling and bruising - Swelling and marked tenderness in the left foot close to the base of the big toe since the fall - Initial bruising present after the fall  Medication use and relevant history - Takes aspirin 81 mg daily - History of gout, does not recall prior treatment   Review of pertinent imaging: 5 view plain film radiographs obtained the left knee on 03/26/2024 per my independent review revealing significant osteophytic change noted tricompartmentally.  Moderate to severe joint space narrowing medially on regular AP view.  Suspect this would be worse with Sarah Wright view.  No appreciable acute osseous abnormality.  3 view plain film radiograph obtained of the right hip on 03/26/2024 osteophytic change of the right femoral acetabular joint with what appears to be sclerotic change around the inferior portion of the capsule.  No obvious fracture or acute osseous  process.   Left lower extremity DVT ultrasound 03/26/2024: IMPRESSION: 1. No evidence of left lower extremity deep vein thrombosis. 2. Small Baker's cyst in the left popliteal fossa.    Objective:   There were no vitals filed for this visit.  Left knee: Joint appears enlarged.  Effusion present.  Tenderness to palpation along medial and lateral joint lines (medial> lateral), patella, pes bursa. Nontender over Gerdy's tubercle,   Right hip: There is a tender pocket of induration which the patient states is associated with the area of significant bruising observed to appear after the fall.  The patient's pants were not removed in order to directly visualize this area of skin.  She is quite tender over the greater trochanter.  Equivocal axial load of the hip.  Positive logroll.  Positive FADIR (localizes toward hip joint).  Positive Deri (localizes to her lateral hip).  Left foot: Modest pitting edema present in the left foot. No erythema. Tenderness to palpation over the first MTP joint, midfoot, medial malleolus, posterior tibialis tendon.  Intra-articular Knee Injection with Ultrasound Guidance Procedure Note Sarah Wright Aug 14, 1945 Indications: Pain Procedure Details Following the description of risks including infection, bleeding, damage to surrounding structures, patient provided written consent for Left knee joint injection procedure. US  was used to identify the suprapatellar pouch. Patient was sterilely prepped in the usual fashion with alcohol.  Following topical anesthetization with ethyl chloride they were injected with a solution of 40mg  Depo-medrol  and 3cc Mepivacaine  2% via the superolateral approach into the suprapatellar pouch. This was well visualized under ultrasound,  please see associated photographic documentation. Patient tolerated well without complication.  Precautions provided. Cleaned and dressing applied.      3 view plain film radiographs obtained of the left  foot today per my independent evaluation revealing significant joint space narrowing present in the first MTP joint of the left foot, as well as several other areas of joint space narrowing present throughout the midfoot.  Prominent osteophytic change noted at the first MTP joint.  No acute osseous abnormalities.  Assessment & Plan:   Assessment & Plan Left knee osteoarthritis with acute contusion   Chronic left knee osteoarthritis is exacerbated by an acute contusion from a fall. X-ray reveals significant arthritis contributing to joint irritability and pain. The fall likely caused the contusion, resulting in swelling and pain. Current pain management with oxycodone  is minimally effective. Administered a steroid injection in the left knee to address arthritis-related pain. Continue using Voltaren gel for additional relief.  Right hip osteoarthritis with contusion and hematoma   Chronic left hip osteoarthritis is complicated by an acute contusion and hematoma from a fall. X-ray indicates arthritis and joint capsule hardening, contributing to pain. The fall likely caused the hematoma, leading to swelling and bruising. Pain worsens with movement and twisting. The bony contusion is expected to resolve in 6-8 weeks. Apply Voltaren gel to alleviate discomfort and monitor for improvement as the hematoma resolves.  Left foot MTP joint and midfoot arthritis She is experiencing a significant flare of pain in her left foot which based on plain film radiographs obtained today seems to be most consistent with an arthritis flare in her MTP joint and midfoot joints.  Offered to follow-up in 1 week for MTP joint injection.  Provided 2-week course of naproxen  and instructed her to not take this concomitantly with meloxicam .  Lab Results  Component Value Date   HGBA1C 5.0 12/25/2023   Greater than 60 minutes was spent reviewing prior imaging, discussing prior imaging findings with radiologist on the phone,  face-to-face time spent with patient eliciting history evaluating her, and discussing treatment options.

## 2024-04-04 ENCOUNTER — Encounter: Payer: Self-pay | Admitting: Physician Assistant

## 2024-04-04 DIAGNOSIS — R52 Pain, unspecified: Secondary | ICD-10-CM

## 2024-04-04 DIAGNOSIS — G894 Chronic pain syndrome: Secondary | ICD-10-CM

## 2024-04-06 MED ORDER — OXYCODONE HCL 15 MG PO TABS: 15.0000 mg | ORAL_TABLET | Freq: Three times a day (TID) | ORAL | 0 refills | Status: AC | PRN

## 2024-04-08 ENCOUNTER — Ambulatory Visit

## 2024-04-08 VITALS — BP 120/70 | Ht 64.0 in | Wt 175.0 lb

## 2024-04-08 DIAGNOSIS — M1712 Unilateral primary osteoarthritis, left knee: Secondary | ICD-10-CM

## 2024-04-08 NOTE — Patient Instructions (Signed)
 Knee Osteoarthritis Treatment Options  Physical therapy/home exercises (strong body of evidence supporting this. It's the cornerstone of any arthritis treatment regimen I recommend, motion is lotion!)   Turmeric (1000 mg) + black pepper (10mg ) once daily (reasonable anti-inflammatory effect if you want to avoid medicines)   Topical anti-inflammatories (Voltaren Gel... Very safe. Almost no systemic absorption. Best applied 4x daily but can start with 2x daily)   Oral anti-inflammatories (ibuprofen, naproxen, etc.... good for flares but not a safe long-term option)   Joint injections --- Steroids (also good for flares, can be done as frequently as every 3 months, but can accelerate the thinning of cartilage if done repeatedly, covered by insurance)  --- Anti-inflammatories (good for flares, can be done as frequently as every 3 months, less harmful to cartilage than steroids, covered by insurance) --- Gel injections (probably a better maintenance therapy but still can be helpful for flares, can be done as frequently as every 6 months, less harmful cartilage and than steroids, covered by nearly all insurance) --- Platelet rich plasma (has the strongest body of evidence among injection options, typically done as single injection but can be repeated, not harmful to cartilage, $500 out of pocket, not covered by insurance) --- Dextrose Prolotherapy (actually a sugar water solution, similar treatment mechanism to platelet rich plasma but not as robust, not harmful to cartilage, covered by insurance)   Genicular nerve block (the 3 small nerves that sense pain from the knee joint can be numbed under ultrasound guidance, can be repeated every 3 months, not harmful to cartilage, covered by insurance)   Total knee replacement surgery   None of these options (except surgery, or course) preclude the others so your treatment plan can be totally customized in any order you'd like.   Feel free to research these  and let me know which you'd like to pursue next.  -Dr. Charles

## 2024-04-08 NOTE — Progress Notes (Signed)
° °  Subjective:    Patient ID: Sarah Wright, female    DOB: 78 y.o., 12/02/1945   MRN: [id number]  Chief Complaint: Left knee arthritis follow up  Discussed the use of AI scribe software for clinical note transcription with the patient, who gave verbal consent to proceed.  History of Present Illness Patient is a pleasant 78 year old female who was evaluated 1 week ago for pain in her left knee, right hip, and left foot after a mechanical ground-level fall.  She is provided with a short course of naproxen  and an intra-articular injection in her left knee.  She presents today specifically to discuss limited benefit from her left knee intra-articular corticosteroid injection 1 week ago. States her pain is largely unchanged Ostensibly she has discussed with her surgeon in the past the possibility of knee replacement but her uncontrolled diabetes and weight precluded this at that time (approximately 10 years ago) Seemed to get fairly significant benefit from Orthovisc injection series in the past     Objective:   Vitals:   04/08/24 0919  BP: 120/70   Left knee: TTP along medial joint line     Assessment & Plan:   Assessment & Plan Sarah Wright is a very pleasant 78 year old female with COPD chronically on 3 L of oxygen  at baseline presenting for follow-up on left knee pain.  She had limited benefit from a corticosteroid injection 1 week ago.  We specifically discussed the myriad treatment options available for treating the osteoarthritis and ultimately she elected to pursue a genicular nerve block.  She did report having some benefit from Orthovisc injection series with Dr. Curtis a year ago which I also think is a potential option to explore.  Follow-up for genicular nerve block.

## 2024-04-09 ENCOUNTER — Telehealth: Payer: Self-pay | Admitting: *Deleted

## 2024-04-09 NOTE — Telephone Encounter (Signed)
 Verified monovisc benefits for left knee.

## 2024-04-10 NOTE — Telephone Encounter (Signed)
°  Patient's daughter, Heron informed pt's responsibility is 20%, approx $540 for Left knee Monovisc. She will speak with patient and they will call us  back with patient's decision.

## 2024-04-11 ENCOUNTER — Ambulatory Visit

## 2024-04-16 ENCOUNTER — Ambulatory Visit: Payer: Self-pay

## 2024-04-16 NOTE — Telephone Encounter (Signed)
 FYI Only or Action Required?: FYI only for provider: Patient told to be seen in next 4 hrs, Patient refuses.  Patient was last seen in primary care on 03/26/2024 by Antoniette Vermell CROME, PA-C.  Called Nurse Triage reporting Leg Swelling.  Symptoms began several weeks ago.  Interventions attempted: Nothing.  Symptoms are: gradually worsening.  Triage Disposition: See HCP Within 4 Hours (Or PCP Triage)  Patient/caregiver understands and will follow disposition?: Yes  Copied from CRM #[id number]. Topic: Clinical - Red Word Triage >> Apr 16, 2024 10:29 AM Rosaria BRAVO wrote: Red Word that prompted transfer to Nurse Triage: Leg swelling, very red, fell 4 weeks ago. Swollen ever since Reason for Disposition  [1] Thigh, calf, or ankle swelling AND [2] bilateral AND [3] 1 side is more swollen  Answer Assessment - Initial Assessment Questions Advised patient to be seen in next four hours. Educated on importance of being sseen, as it could be a DVT. Patient does not want me to look for appointments. I told her she should then go to the ER because [facility] won't have US  capability. Patient will think about it but most likely refusing.  1. ONSET: When did the swelling start? (e.g., minutes, hours, days)     4 weeks ago, think she was seen for this but can't remember.  2. LOCATION: What part of the leg is swollen?  Are both legs swollen or just one leg?     Both legs are swollen, left leg is starting to turn red. Left leg swelling is a little worse. 3. SEVERITY: How bad is the swelling? (e.g., localized; mild, moderate, severe)     From calf down 4. REDNESS: Is there redness or signs of infection?     Says it doesn't look infected. Patient says redness started. 5. PAIN: Is the swelling painful to touch? If Yes, ask: How painful is it?   (Scale 1-10; mild, moderate or severe)     5 6. FEVER: Do you have a fever? If Yes, ask: What is it, how was it measured, and when did it start?       Denies 7. CAUSE: What do you think is causing the leg swelling?     Fall, Says left leg made her fall, got tangled, and landed on right side 8.  Denies any prior  10. OTHER SYMPTOMS: Do you have any other symptoms? (e.g., chest pain, difficulty breathing)       Denies  Protocols used: Leg Swelling and Edema-A-AH

## 2024-04-18 NOTE — Telephone Encounter (Signed)
 Routing to covering provider -   No appointments available in the clinic or around the region. Advised patient to be seen in next four hours. Educated on importance of being sseen, as it could be a DVT. Patient does not want me to look for appointments. I told her she should then go to the ER because [facility] won't have US  capability. Patient will think about it but most likely refusing.

## 2024-04-21 ENCOUNTER — Encounter

## 2024-04-21 NOTE — Telephone Encounter (Signed)
 The patient was evaluated earlier today at [facility] (Dr. Charles) for the following symptoms.

## 2024-04-21 NOTE — Progress Notes (Unsigned)
 Erroneous encounter due to cancellation or no-show. Patient was not seen.

## 2024-04-23 ENCOUNTER — Other Ambulatory Visit: Payer: Self-pay | Admitting: Physician Assistant

## 2024-04-23 DIAGNOSIS — R413 Other amnesia: Secondary | ICD-10-CM

## 2024-04-28 ENCOUNTER — Telehealth: Payer: Self-pay

## 2024-04-28 ENCOUNTER — Other Ambulatory Visit: Payer: Self-pay | Admitting: Physician Assistant

## 2024-04-28 DIAGNOSIS — F410 Panic disorder [episodic paroxysmal anxiety] without agoraphobia: Secondary | ICD-10-CM

## 2024-04-28 MED ORDER — ONDANSETRON 8 MG PO TBDP: 8.0000 mg | ORAL_TABLET | Freq: Three times a day (TID) | ORAL | 1 refills | Status: DC | PRN

## 2024-04-28 MED ORDER — FUROSEMIDE 20 MG PO TABS: 20.0000 mg | ORAL_TABLET | Freq: Every day | ORAL | 0 refills | Status: DC | PRN

## 2024-04-28 MED ORDER — HYDROXYZINE PAMOATE 25 MG PO CAPS: ORAL_CAPSULE | ORAL | 0 refills | Status: AC

## 2024-04-28 NOTE — Progress Notes (Signed)
 TOC Nurse reached out and stated patient needed lasix , hydroxyzine  and zofran . Sent to pharmacy. Hospital discharge follow up scheduled for 05/06/2024.

## 2024-04-28 NOTE — Transitions of Care (Post Inpatient/ED Visit) (Signed)
 "  04/28/2024  Name: Sarah Wright MRN: [id number] DOB: [birth year]  Today's TOC FU Call Status: Today's TOC FU Call Status:: Successful TOC FU Call Completed TOC FU Call Complete Date: 04/28/24  Patient's Name and Date of Birth confirmed. DOB, Name  Transition Care Management Follow-up Telephone Call Date of Discharge: 04/28/24 Discharge Facility: Other ([facility]) Name of [facility]: [facility] Type of Discharge: Inpatient Admission Primary Inpatient Discharge Diagnosis:: Symptomatic anemia How have you been since you were released from the hospital?: Same Any questions or concerns?: Yes Patient Questions/Concerns:: cause of anemia- reviewed asa and Nsaid  Items Reviewed: Did you receive and understand the discharge instructions provided?: Yes Medications obtained,verified, and reconciled?: Yes (Medications Reviewed) Any new allergies since your discharge?: No Dietary orders reviewed?: NA Do you have support at home?: Yes People in Home [RPT]: child(ren), adult, grandchild(ren) Name of Support/Comfort Primary Source: lives with daughter and granddaughter who is 69 years old  Medications Reviewed Today: Medications Reviewed Today     Reviewed by Lauro Shona LABOR, RN (Registered Nurse) on 04/28/24 at 1254  Med List Status: <None>   Medication Order Taking? Sig Documenting Provider Last Dose Status Informant  acetaminophen  (TYLENOL ) 325 MG tablet [id number] Yes Take 650 mg by mouth 3 (three) times daily as needed (pain). [provider]  Active   aspirin 81 MG tablet [id number]  Take 81 mg by mouth daily.  Patient not taking: Reported on 04/28/2024   [provider]  Consider Medication Status and Discontinue (Discontinued by provider)   atorvastatin  (LIPITOR) 20 MG tablet [id number] Yes TAKE 1 TABLET BY MOUTH EVERY DAY Breeback, Jade L, PA-C  Active   BREZTRI  AEROSPHERE 160-9-4.8 MCG/ACT AERO [id number] Yes INHALE 2 PUFFS INTO THE  LUNGS IN THE MORNING AND AT BEDTIME. McQuaid, Douglas B, MD  Active   buPROPion  (WELLBUTRIN  XL) 150 MG 24 hr tablet [id number] Yes TAKE 2 TABLETS BY MOUTH EVERY DAY Breeback, Jade L, PA-C  Active   diclofenac Sodium (VOLTAREN) 1 % GEL [id number] Yes Apply 2 g topically 4 (four) times daily as needed (joint pain). [provider]  Active   donepezil  (ARICEPT ) 10 MG tablet [id number] Yes Take 10 mg by mouth at bedtime. [provider]  Active   DULoxetine  (CYMBALTA ) 60 MG capsule [id number] Yes TAKE 1 CAPSULE BY MOUTH TWICE A DAY Breeback, Jade L, PA-C  Active   ferrous sulfate 325 (65 FE) MG EC tablet [id number] Yes Take 325 mg by mouth 2 (two) times daily. [provider]  Active   fluticasone  (FLONASE ) 50 MCG/ACT nasal spray [id number] Yes SPRAY 2 SPRAYS INTO EACH NOSTRIL EVERY DAY Blair, Diane W, FNP  Active   furosemide  (LASIX ) 20 MG tablet [id number] Yes Take 20 mg by mouth daily as needed for edema. [provider]  Active   hydrOXYzine  (VISTARIL ) 25 MG capsule [id number] Yes Take 1-2 tablets as needed up to three times a day for anxiety/panic. Breeback, Jade L, PA-C  Active   ipratropium-albuterol  (DUONEB) 0.5-2.5 (3) MG/3ML SOLN [id number] Yes Take 3 mLs by nebulization every 4 (four) hours as needed. Breeback, Jade L, PA-C  Active   levalbuterol [facility] HFA) 45 MCG/ACT inhaler [id number] Yes INHALE 1 TO 2 PUFFS BY MOUTH EVERY 6 HOURS AS NEEDED FOR WHEEZE Breeback, Jade L, PA-C  Active   meloxicam  (MOBIC ) 15 MG tablet [id number]  TAKE 1 TABLET (15 MG TOTAL) BY MOUTH DAILY.  Patient not taking: Reported on 04/28/2024   Breeback, Jade L, PA-C  Consider Medication Status and Discontinue (Discontinued by provider)   midodrine  (PROAMATINE ) 10 MG tablet [id number] Yes Take 1 tablet (10 mg total) by mouth 3 (three) times daily. Breeback, Jade L, PA-C  Active   montelukast  (SINGULAIR ) 10 MG tablet [id number] Yes TAKE 1 TABLET BY MOUTH EVERYDAY AT BEDTIME Breeback, Jade L, PA-C   Active   NALOXEGOL OXALATE PO [id number] Yes Take 25 mg by mouth daily. [provider]  Active   naloxone [facility]) nasal spray 4 mg/0.1 mL [id number] Yes Place 1 spray into the nose as directed. [provider]  Active   naproxen  (NAPROSYN ) 500 MG tablet [id number]  Take 1 tablet (500 mg total) by mouth 2 (two) times daily with a meal.  Patient not taking: Reported on 04/28/2024   Charles Rogue A, DO  Consider Medication Status and Discontinue (Discontinued by provider)   OLANZapine  (ZYPREXA ) 10 MG tablet [id number] Yes TAKE 1 TABLET BY MOUTH EVERYDAY AT BEDTIME Breeback, Jade L, PA-C  Active   ondansetron  (ZOFRAN ) 8 MG tablet [id number] Yes TAKE 1 TABLET BY MOUTH EVERY 8 HOURS AS NEEDED FOR NAUSEA AND VOMITING Breeback, Jade L, PA-C  Active   oxyCODONE  (ROXICODONE ) 15 MG immediate release tablet [id number] Yes Take 1 tablet (15 mg total) by mouth every 8 (eight) hours as needed for pain. Crain, Whitney L, [place]  Active   OXYGEN  [id number] Yes Inhale 3 L into the lungs continuous. [provider]  Active   pantoprazole (PROTONIX) 40 MG tablet [id number] Yes Take 40 mg by mouth daily. [provider]  Active   pramipexole  (MIRAPEX ) 0.5 MG tablet [id number] Yes TAKE ONE TABLET BY MOUTH 2-3 HOURS BEFORE BEDTIME. Breeback, Jade L, PA-C  Active   primidone  (MYSOLINE ) 50 MG tablet [id number]  TAKE 1 TABLET BY MOUTH EVERYDAY AT BEDTIME  Patient not taking: Reported on 04/28/2024   Breeback, Jade L, PA-C  Active   tirzepatide  (MOUNJARO ) 10 MG/0.5ML Pen [id number] Yes Inject 10 mg into the skin once a week. Antoniette Vermell CROME, PA-C  Active   traZODone  (DESYREL ) 100 MG tablet [id number] Yes TAKE 1 TABLET BY MOUTH EVERYDAY AT BEDTIME Breeback, Jade L, PA-C  Active             Home Care and Equipment/Supplies: Were Home Health Services Ordered?: No Any new equipment or medical supplies ordered?: No  Functional Questionnaire: Do you need assistance with bathing/showering or  dressing?: No Do you need assistance with meal preparation?: Yes (daugther prepares all meals) Do you need assistance with eating?: No Do you have difficulty maintaining continence: Yes (has some occasional bladder incontinence) Do you need assistance with getting out of bed/getting out of a chair/moving?: No Do you have difficulty managing or taking your medications?: No  Follow up appointments reviewed: PCP Follow-up appointment confirmed?: Yes Date of PCP follow-up appointment?: 05/06/24 Follow-up Provider: PCP, [facility] Follow-up appointment confirmed?: No Do you need transportation to your follow-up appointment?: No (Daughter drives her) Do you understand care options if your condition(s) worsen?: Yes-patient verbalized understanding  SDOH Interventions Today    Flowsheet Row Most Recent Value  SDOH Interventions   Food Insecurity Interventions Intervention Not Indicated  Housing Interventions Intervention Not Indicated  Transportation Interventions Intervention Not Indicated  Utilities Interventions Intervention Not Indicated    Goals Addressed             This Visit's Progress    VBCI Transitions of Care (TOC) Care Plan       Problems:  Recent  Hospitalization for treatment of Symptomatic anemia Functional/Safety concern: Fall risk discussed/education provided  Goal:  Over the next 30 days, the patient will not experience hospital readmission  Interventions:  Transitions of Care: Doctor Visits  - discussed the importance of doctor visits Communication with PCP, [name] re: medication reconciliation Arranged PCP follow-up within 7 days (TOC RN Scheduled) Message sent to PCP as follows: I scheduled for patient to see you 1/13 - she discharged 04/25/24 - Donepezil  10mg  on d/c not in [facility] - okay to add? Furosemide  20mg  prn ankle swelling on d/c not in [facility] - she takes midodrine  - is it okay to add Furosemide ? If saw needs refill at [facility] [employer]  [place], also needs refill hydroxyzine  25mg  - d/c shows naloxegol oxalate 25mg  daily and Narcan - is it okay to add? duoneb not on dc patient asking for refill - she also needs Ondansetron  8mg  filled - response received with okay to add new and will sent Rx for refills  Anemia/Bleeding Interventions:  Assessment of understanding of anemia/bleeding disorder diagnosis  Basic overview and discussion of anemia/bleeding disorder or acute disease state  Medications reviewed  Counseled on avoidance of NSAIDs due to increased bleeding risk Provided education about signs and symptoms of active bleeding such as stomach discomfort, coughing up blood or blood tinged secretions, bleeding from the gums/teeth, nosebleeds, increased bruising, blood in the urine/stool and/or if a traumatic injury occurs, regardless of severity of injury  Advised to call provider or 911 if active bleeding or signs and symptoms of active bleeding occur encouraged strategies to prevent falls related to fatigue, weakness and dizziness; encouraged sitting before standing and using an assistive device Lab Results  Component Value Date   WBC 6.2 03/21/2023   HGB 11.1 03/21/2023   HCT 35.2 03/21/2023   MCV 88 03/21/2023   PLT 203 03/21/2023     Falls Interventions: Reviewed medications and discussed potential side effects of medications such as dizziness and frequent urination Advised patient of importance of notifying provider of falls   Pain Interventions: Pain assessment performed Medications reviewed Counseled on the importance of reporting any/all new or changed pain symptoms or management strategies to pain management provider Educated on importance of having family member know where Narcan is located and how to [employer]  Patient Self Care Activities:  Attend all scheduled provider appointments Call pharmacy for medication refills 3-7 days in advance of running out of medications Call provider office for new concerns  or questions  Notify RN Care Manager of TOC call rescheduling needs Participate in Transition of Care Program/Attend TOC scheduled calls Take medications as prescribed   identify and remove indoor air pollutants limit outdoor activity during cold weather listen for public air quality announcements every day Notify provider with any falls or return of symptoms related to anemia  Plan:  Telephone follow up appointment with care management team member scheduled for:  05/07/24 in the am The patient has been provided with contact information for the care management team and has been advised to call with any health related questions or concerns.         Shona Prow RN, CCM [place]  VBCI-Population Health RN Care Manager [phone]  "

## 2024-04-28 NOTE — Transitions of Care (Post Inpatient/ED Visit) (Deleted)
 "  04/28/2024  Name: Sarah Wright MRN: [id number] DOB: [birth year]  Today's TOC FU Call Status: Today's TOC FU Call Status:: Successful TOC FU Call Completed TOC FU Call Complete Date: 04/28/24  Patient's Name and Date of Birth confirmed. DOB, Name  Transition Care Management Follow-up Telephone Call Date of Discharge: 04/28/24 Discharge Facility: [facility] ([facility]) [facility] Type of Discharge: Inpatient Admission Primary Inpatient Discharge Diagnosis:: Symptomatic anemia How have you been since you were released from the hospital?: Same Any questions or concerns?: Yes Patient Questions/Concerns:: cause of anemia- reviewed asa and Nsaid  Items Reviewed: Did you receive and understand the discharge instructions provided?: Yes Medications obtained,verified, and reconciled?: Yes (Medications Reviewed) Any new allergies since your discharge?: No Dietary orders reviewed?: NA Do you have support at home?: Yes People in Home [RPT]: child(ren), adult, grandchild(ren) Name of Support/Comfort Primary Source: lives with daughter and granddaughter who is 31 years old  Medications Reviewed Today: Medications Reviewed Today     Reviewed by Lauro Shona LABOR, RN (Registered Nurse) on 04/28/24 at 1249  Med List Status: <None>   Medication Order Taking? Sig Documenting Provider Last Dose Status Informant  acetaminophen  (TYLENOL ) 325 MG tablet [id number] Yes Take 650 mg by mouth 3 (three) times daily as needed (pain). [provider]  Active   aspirin 81 MG tablet [id number]  Take 81 mg by mouth daily.  Patient not taking: Reported on 04/28/2024   [provider]  Consider Medication Status and Discontinue (Discontinued by provider)   atorvastatin  (LIPITOR) 20 MG tablet [id number] Yes TAKE 1 TABLET BY MOUTH EVERY DAY Breeback, Jade L, PA-C  Active   BREZTRI  AEROSPHERE 160-9-4.8 MCG/ACT AERO [id number] Yes INHALE 2 PUFFS INTO THE  LUNGS IN THE MORNING AND AT BEDTIME. McQuaid, Douglas B, MD  Active   buPROPion  (WELLBUTRIN  XL) 150 MG 24 hr tablet [id number] Yes TAKE 2 TABLETS BY MOUTH EVERY DAY Breeback, Jade L, PA-C  Active   diclofenac Sodium (VOLTAREN) 1 % GEL [id number] Yes Apply 2 g topically 4 (four) times daily as needed (joint pain). [provider]  Active   donepezil  (ARICEPT ) 10 MG tablet [id number] Yes Take 10 mg by mouth at bedtime. [provider]  Active   DULoxetine  (CYMBALTA ) 60 MG capsule [id number] Yes TAKE 1 CAPSULE BY MOUTH TWICE A DAY Breeback, Jade L, PA-C  Active   ferrous sulfate 325 (65 FE) MG EC tablet [id number] Yes Take 325 mg by mouth 2 (two) times daily. [provider]  Active   fluticasone  (FLONASE ) 50 MCG/ACT nasal spray [id number] Yes SPRAY 2 SPRAYS INTO EACH NOSTRIL EVERY DAY Blair, Diane W, FNP  Active   furosemide  (LASIX ) 20 MG tablet [id number] Yes Take 20 mg by mouth daily as needed for edema. [provider]  Active   hydrOXYzine  (VISTARIL ) 25 MG capsule [id number] Yes Take 1-2 tablets as needed up to three times a day for anxiety/panic. Breeback, Jade L, PA-C  Active   ipratropium-albuterol  (DUONEB) 0.5-2.5 (3) MG/3ML SOLN [id number] Yes Take 3 mLs by nebulization every 4 (four) hours as needed. Breeback, Jade L, PA-C  Active   levalbuterol [facility] HFA) 45 MCG/ACT inhaler [id number] Yes INHALE 1 TO 2 PUFFS BY MOUTH EVERY 6 HOURS AS NEEDED FOR WHEEZE Breeback, Jade L, PA-C  Active   meloxicam  (MOBIC ) 15 MG tablet [id number]  TAKE 1 TABLET (15 MG TOTAL) BY MOUTH DAILY.  Patient not taking: Reported on 04/28/2024   Breeback, Jade L, PA-C  Consider Medication Status and Discontinue (Discontinued by provider)   midodrine  (PROAMATINE ) 10 MG tablet [id number] Yes Take 1 tablet (10 mg total) by mouth 3 (three) times daily. Breeback, Jade L, PA-C  Active   montelukast  (SINGULAIR ) 10 MG tablet [id number] Yes TAKE 1 TABLET BY MOUTH EVERYDAY AT BEDTIME Breeback, Jade L, PA-C   Active   NALOXEGOL OXALATE PO [id number] Yes Take 25 mg by mouth daily. [provider]  Active   naloxone [facility]) nasal spray 4 mg/0.1 mL [id number] Yes Place 1 spray into the nose as directed. [provider]  Active   naproxen  (NAPROSYN ) 500 MG tablet [id number]  Take 1 tablet (500 mg total) by mouth 2 (two) times daily with a meal.  Patient not taking: Reported on 04/28/2024   Charles Rogue A, DO  Consider Medication Status and Discontinue (Discontinued by provider)   OLANZapine  (ZYPREXA ) 10 MG tablet [id number] Yes TAKE 1 TABLET BY MOUTH EVERYDAY AT BEDTIME Breeback, Jade L, PA-C  Active   ondansetron  (ZOFRAN ) 8 MG tablet [id number] Yes TAKE 1 TABLET BY MOUTH EVERY 8 HOURS AS NEEDED FOR NAUSEA AND VOMITING Breeback, Jade L, PA-C  Active   oxyCODONE  (ROXICODONE ) 15 MG immediate release tablet [id number] Yes Take 1 tablet (15 mg total) by mouth every 8 (eight) hours as needed for pain. Crain, Whitney L, PA  Active   pantoprazole (PROTONIX) 40 MG tablet [id number] Yes Take 40 mg by mouth daily. [provider]  Active   pramipexole  (MIRAPEX ) 0.5 MG tablet [id number] Yes TAKE ONE TABLET BY MOUTH 2-3 HOURS BEFORE BEDTIME. Antoniette Vermell CROME, PA-C  Active   primidone  (MYSOLINE ) 50 MG tablet [id number]  TAKE 1 TABLET BY MOUTH EVERYDAY AT BEDTIME  Patient not taking: Reported on 04/28/2024   Breeback, Jade L, PA-C  Active   tirzepatide  (MOUNJARO ) 10 MG/0.5ML Pen [id number] Yes Inject 10 mg into the skin once a week. Breeback, Jade L, PA-C  Active   traZODone  (DESYREL ) 100 MG tablet [id number] Yes TAKE 1 TABLET BY MOUTH EVERYDAY AT BEDTIME Breeback, Jade L, PA-C  Active             Home Care and Equipment/Supplies: Were Home Health Services Ordered?: No Any new equipment or medical supplies ordered?: No  Functional Questionnaire: Do you need assistance with bathing/showering or dressing?: No Do you need assistance with meal preparation?: Yes (daugther prepares all meals) Do you  need assistance with eating?: No Do you have difficulty maintaining continence: Yes (has some occasional bladder incontinence) Do you need assistance with getting out of bed/getting out of a chair/moving?: No Do you have difficulty managing or taking your medications?: No  Follow up appointments reviewed: PCP Follow-up appointment confirmed?: Yes Date of PCP follow-up appointment?: 05/06/24 Follow-up Provider: PCP, [facility] Follow-up appointment confirmed?: No Do you need transportation to your follow-up appointment?: No (Daughter drives her) Do you understand care options if your condition(s) worsen?: Yes-patient verbalized understanding  SDOH Interventions Today    Flowsheet Row Most Recent Value  SDOH Interventions   Food Insecurity Interventions Intervention Not Indicated  Housing Interventions Intervention Not Indicated  Transportation Interventions Intervention Not Indicated  Utilities Interventions Intervention Not Indicated    Goals Addressed             This Visit's Progress    VBCI Transitions of Care (TOC) Care Plan       Problems:  Recent Hospitalization for treatment of Symptomatic anemia Functional/Safety concern: Fall risk discussed/education provided  Goal:  Over the  next 30 days, the patient will not experience hospital readmission  Interventions:  Transitions of Care: Doctor Visits  - discussed the importance of doctor visits Communication with PCP, [name] re: medication reconciliation Arranged PCP follow-up within 7 days (TOC RN Scheduled) Message sent to PCP as follows: I scheduled for patient to see you 1/13 - she discharged 04/25/24 - Donepezil  10mg  on d/c not in [facility] - okay to add? Furosemide  20mg  prn ankle swelling on d/c not in [facility] - she takes midodrine  - is it okay to add Furosemide ? [facility] [employer] [place], also needs refill hydroxyzine  25mg  - d/c shows naloxegol oxalate 25mg  daily and Narcan - is it okay  to add? duoneb not on dc patient asking for refill - she also needs Ondansetron  8mg  filled - response received with okay to add new and will sent Rx for refills  Anemia/Bleeding Interventions:  Assessment of understanding of anemia/bleeding disorder diagnosis  Basic overview and discussion of anemia/bleeding disorder or acute disease state  Medications reviewed  Counseled on avoidance of NSAIDs due to increased bleeding risk Provided education about signs and symptoms of active bleeding such as stomach discomfort, coughing up blood or blood tinged secretions, bleeding from the gums/teeth, nosebleeds, increased bruising, blood in the urine/stool and/or if a traumatic injury occurs, regardless of severity of injury  Advised to call provider or 911 if active bleeding or signs and symptoms of active bleeding occur encouraged strategies to prevent falls related to fatigue, weakness and dizziness; encouraged sitting before standing and using an assistive device Lab Results  Component Value Date   WBC 6.2 03/21/2023   HGB 11.1 03/21/2023   HCT 35.2 03/21/2023   MCV 88 03/21/2023   PLT 203 03/21/2023     Falls Interventions: Reviewed medications and discussed potential side effects of medications such as dizziness and frequent urination Advised patient of importance of notifying provider of falls   Pain Interventions: Pain assessment performed Medications reviewed Counseled on the importance of reporting any/all new or changed pain symptoms or management strategies to pain management provider Educated on importance of having family member know where Narcan is located and how to [employer]  Patient Self Care Activities:  Attend all scheduled provider appointments Call pharmacy for medication refills 3-7 days in advance of running out of medications Call provider office for new concerns or questions  Notify RN Care Manager of TOC call rescheduling needs Participate in Transition of Care  Program/Attend TOC scheduled calls Take medications as prescribed   identify and remove indoor air pollutants limit outdoor activity during cold weather listen for public air quality announcements every day Notify provider with any falls or return of symptoms related to anemia  Plan:  Telephone follow up appointment with care management team member scheduled for:  05/07/24 in the am The patient has been provided with contact information for the care management team and has been advised to call with any health related questions or concerns.         Shona Prow RN, CCM [place]  VBCI-Population Health RN Care Manager [phone]  "

## 2024-04-28 NOTE — Patient Instructions (Signed)
 Visit Information  Thank you for taking time to visit with me today. Please don't hesitate to contact me if I can be of assistance to you before our next scheduled telephone appointment.  Our next appointment is by telephone on 05/07/24 in the am  Following is a copy of your care plan:   Goals Addressed             This Visit's Progress    VBCI Transitions of Care (TOC) Care Plan       Problems:  Recent Hospitalization for treatment of Symptomatic anemia Functional/Safety concern: Fall risk discussed/education provided  Goal:  Over the next 30 days, the patient will not experience hospital readmission  Interventions:  Transitions of Care: Doctor Visits  - discussed the importance of doctor visits Communication with PCP, [name] re: medication reconciliation Arranged PCP follow-up within 7 days (TOC RN Scheduled) Message sent to PCP as follows: I scheduled for patient to see you 1/13 - she discharged 04/25/24 - Donepezil  10mg  on d/c not in cone - okay to add? Furosemide  20mg  prn ankle swelling on d/c not in cone - she takes midodrine  - is it okay to add Furosemide ? If saw needs refill at [facility] [employer] [place], also needs refill hydroxyzine  25mg  - d/c shows naloxegol oxalate 25mg  daily and Narcan - is it okay to add? duoneb not on dc patient asking for refill - she also needs Ondansetron  8mg  filled - response received with okay to add new and will sent Rx for refills  Anemia/Bleeding Interventions:  Assessment of understanding of anemia/bleeding disorder diagnosis  Basic overview and discussion of anemia/bleeding disorder or acute disease state  Medications reviewed  Counseled on avoidance of NSAIDs due to increased bleeding risk Provided education about signs and symptoms of active bleeding such as stomach discomfort, coughing up blood or blood tinged secretions, bleeding from the gums/teeth, nosebleeds, increased bruising, blood in the urine/stool and/or if a traumatic injury  occurs, regardless of severity of injury  Advised to call provider or 911 if active bleeding or signs and symptoms of active bleeding occur encouraged strategies to prevent falls related to fatigue, weakness and dizziness; encouraged sitting before standing and using an assistive device Lab Results  Component Value Date   WBC 6.2 03/21/2023   HGB 11.1 03/21/2023   HCT 35.2 03/21/2023   MCV 88 03/21/2023   PLT 203 03/21/2023     Falls Interventions: Reviewed medications and discussed potential side effects of medications such as dizziness and frequent urination Advised patient of importance of notifying provider of falls   Pain Interventions: Pain assessment performed Medications reviewed Counseled on the importance of reporting any/all new or changed pain symptoms or management strategies to pain management provider Educated on importance of having family member know where Narcan is located and how to [employer]  Patient Self Care Activities:  Attend all scheduled provider appointments Call pharmacy for medication refills 3-7 days in advance of running out of medications Call provider office for new concerns or questions  Notify RN Care Manager of TOC call rescheduling needs Participate in Transition of Care Program/Attend TOC scheduled calls Take medications as prescribed   identify and remove indoor air pollutants limit outdoor activity during cold weather listen for public air quality announcements every day Notify provider with any falls or return of symptoms related to anemia  Plan:  Telephone follow up appointment with care management team member scheduled for:  05/07/24 in the am The patient has been provided with contact  information for the care management team and has been advised to call with any health related questions or concerns.         Patient verbalizes understanding of instructions and care plan provided today and agrees to view in MyChart. Active MyChart  status and patient understanding of how to access instructions and care plan via MyChart confirmed with patient.     Telephone follow up appointment with care management team member scheduled for: 05/07/24 in am The patient has been provided with contact information for the care management team and has been advised to call with any health related questions or concerns.   Please call the care guide team at [phone] if you need to cancel or reschedule your appointment.   Please call the Suicide and Crisis Lifeline: 988 call 1-800-273-TALK (toll free, 24 hour hotline) call 911 if you are experiencing a Mental Health or Behavioral Health Crisis or need someone to talk to.  Shona Prow RN, CCM [place]  VBCI-Population Health RN Care Manager [phone]

## 2024-05-06 ENCOUNTER — Inpatient Hospital Stay: Admitting: Physician Assistant

## 2024-05-08 ENCOUNTER — Telehealth: Payer: Self-pay

## 2024-05-08 NOTE — Transitions of Care (Post Inpatient/ED Visit) (Signed)
 " Transition of Care week 2  Visit Note  05/08/2024  Name: Sarah Wright MRN: [id number]          DOB: 1945-10-20  Situation: Patient enrolled in [facility] 30-day program. Visit completed with patient by telephone.   Background: Admit/Discharge Date:  1/1 - 1/2     [facility] Primary Diagnosis: Symptomatic anemia  Initial Transition Care Management Follow-up Telephone Call Discharge Date and Diagnosis: 04/28/24, Symptomatic anemia   Past Medical History:  Diagnosis Date   Diabetes mellitus without complication (HCC)    Hyperlipidemia    Orthostatic hypotension    Thyroid  disease     Assessment: Patient Reported Symptoms: Cognitive Cognitive Status: No symptoms reported, Normal speech and language skills, Alert and oriented to person, place, and time      Neurological Neurological Review of Symptoms: Headaches Neurological Self-Management Outcome: 3 (uncertain)  HEENT HEENT Symptoms Reported: No symptoms reported      Cardiovascular      Respiratory Respiratory Symptoms Reported: Other: Other Respiratory Symptoms: patient reports continues with O2 Respiratory Management Strategies: Oxygen  therapy, Medication therapy  Endocrine Endocrine Symptoms Reported: No symptoms reported Is patient diabetic?: Yes Is patient checking blood sugars at home?: No Endocrine Comment: states PCP told her she does not need to check her sugar  Gastrointestinal Additional Gastrointestinal Details: patient states nausea has improved and constipation is normal - moved bowels 2 days ago Gastrointestinal Self-Management Outcome: 4 (good)    Genitourinary Genitourinary Symptoms Reported: Incontinence Genitourinary Management Strategies: Incontinence garment/pad  Integumentary Integumentary Symptoms Reported: No symptoms reported Other Integumentary Symptoms: patient states she has stopped using the hydrogen peroxide and finger tips are better    Musculoskeletal Musculoskelatal Symptoms Reviewed:  Weakness Additional Musculoskeletal Details: Patient states she is still using walker Musculoskeletal Management Strategies: Adequate rest, Activity      Psychosocial           There were no vitals filed for this visit. Pain Scale: 0-10 Pain Score: 5  Pain Type: Other (Comment) (patient reports generalized body aches) Pain Descriptors / Indicators: Aching Patients Stated Pain Goal: 3 Pain Intervention(s): Hot/Cold interventions, Rest, Pet therapy (patient states the dog, Buttons helps her)  Medications Reviewed Today     Reviewed by Lauro Shona LABOR, RN (Registered Nurse) on 05/08/24 at 1444  Med List Status: <None>   Medication Order Taking? Sig Documenting Provider Last Dose Status Informant  acetaminophen  (TYLENOL ) 325 MG tablet [id number] Yes Take 650 mg by mouth 3 (three) times daily as needed (pain). [provider]  Active   aspirin 81 MG tablet [id number]  Take 81 mg by mouth daily.  Patient not taking: Reported on 05/08/2024   [provider]  Active   atorvastatin  (LIPITOR) 20 MG tablet [id number] Yes TAKE 1 TABLET BY MOUTH EVERY DAY Breeback, Jade L, PA-C  Active   BREZTRI  AEROSPHERE 160-9-4.8 MCG/ACT AERO [id number] Yes INHALE 2 PUFFS INTO THE LUNGS IN THE MORNING AND AT BEDTIME. McQuaid, Douglas B, MD  Active   buPROPion  (WELLBUTRIN  XL) 150 MG 24 hr tablet [id number] Yes TAKE 2 TABLETS BY MOUTH EVERY DAY Breeback, Jade L, PA-C  Active   diclofenac Sodium (VOLTAREN) 1 % GEL [id number] Yes Apply 2 g topically 4 (four) times daily as needed (joint pain). [provider]  Active   donepezil  (ARICEPT ) 10 MG tablet [id number] Yes Take 10 mg by mouth at bedtime. [provider]  Active   DULoxetine  (CYMBALTA ) 60 MG capsule [id number] Yes TAKE 1 CAPSULE BY MOUTH TWICE  A DAY Breeback, Jade L, PA-C  Active   ferrous sulfate 325 (65 FE) MG EC tablet [id number] Yes Take 325 mg by mouth 2 (two) times daily. [provider]  Active   fluticasone   (FLONASE ) 50 MCG/ACT nasal spray [id number] Yes SPRAY 2 SPRAYS INTO EACH NOSTRIL EVERY DAY Blair, Diane W, FNP  Active   furosemide  (LASIX ) 20 MG tablet [id number] Yes Take 1 tablet (20 mg total) by mouth daily as needed for edema. Breeback, Jade L, PA-C  Active   hydrOXYzine  (VISTARIL ) 25 MG capsule [id number] Yes Take 1-2 tablets as needed up to three times a day for anxiety/panic. Breeback, Jade L, PA-C  Active   ipratropium-albuterol  (DUONEB) 0.5-2.5 (3) MG/3ML SOLN [id number]  Take 3 mLs by nebulization every 4 (four) hours as needed.  Patient not taking: Reported on 05/08/2024   Breeback, Jade L, PA-C  Active   levalbuterol (XOPENEX HFA) 45 MCG/ACT inhaler [id number] Yes INHALE 1 TO 2 PUFFS BY MOUTH EVERY 6 HOURS AS NEEDED FOR WHEEZE Breeback, Jade L, PA-C  Active   meloxicam  (MOBIC ) 15 MG tablet [id number]  TAKE 1 TABLET (15 MG TOTAL) BY MOUTH DAILY.  Patient not taking: Reported on 05/08/2024   Antoniette Vermell CROME, PA-C  Active   midodrine  (PROAMATINE ) 10 MG tablet [id number] Yes Take 1 tablet (10 mg total) by mouth 3 (three) times daily. Breeback, Jade L, PA-C  Active   montelukast  (SINGULAIR ) 10 MG tablet [id number] Yes TAKE 1 TABLET BY MOUTH EVERYDAY AT BEDTIME Breeback, Jade L, PA-C  Active   NALOXEGOL OXALATE PO [id number] Yes Take 25 mg by mouth daily. [provider]  Active   naloxone [facility]) nasal spray 4 mg/0.1 mL [id number]  Place 1 spray into the nose as directed.  Patient not taking: Reported on 05/08/2024   [provider]  Active   naproxen  (NAPROSYN ) 500 MG tablet [id number]  Take 1 tablet (500 mg total) by mouth 2 (two) times daily with a meal.  Patient not taking: Reported on 05/08/2024   Charles Rogue A, DO  Active   OLANZapine  (ZYPREXA ) 10 MG tablet [id number]  TAKE 1 TABLET BY MOUTH EVERYDAY AT BEDTIME Breeback, Jade L, PA-C  Active   ondansetron  (ZOFRAN ) 8 MG tablet [id number]  TAKE 1 TABLET BY MOUTH EVERY 8 HOURS AS NEEDED FOR NAUSEA AND VOMITING  Patient not  taking: Reported on 05/08/2024   Breeback, Jade L, PA-C  Active   ondansetron  (ZOFRAN -ODT) 8 MG disintegrating tablet [id number] Yes Take 1 tablet (8 mg total) by mouth every 8 (eight) hours as needed. Breeback, Jade L, PA-C  Active   OXYGEN  [id number] Yes Inhale 3 L into the lungs continuous. [provider]  Active   pantoprazole (PROTONIX) 40 MG tablet [id number] Yes Take 40 mg by mouth daily. [provider]  Active   pramipexole  (MIRAPEX ) 0.5 MG tablet [id number] Yes TAKE ONE TABLET BY MOUTH 2-3 HOURS BEFORE BEDTIME. Breeback, Jade L, PA-C  Active   primidone  (MYSOLINE ) 50 MG tablet [id number]  TAKE 1 TABLET BY MOUTH EVERYDAY AT BEDTIME  Patient not taking: Reported on 05/08/2024   Breeback, Jade L, PA-C  Active   tirzepatide  (MOUNJARO ) 10 MG/0.5ML Pen [id number] Yes Inject 10 mg into the skin once a week. Breeback, Jade L, PA-C  Active   traZODone  (DESYREL ) 100 MG tablet [id number] Yes TAKE 1 TABLET BY MOUTH EVERYDAY AT BEDTIME Breeback, Jade L, PA-C  Active             Recommendation:  Continue Current Plan of Care  Follow Up Plan:   Telephone follow up appointment date/time:  05/15/24 in the morning  Shona Prow RN, CCM [place]  VBCI-Population Health RN Care Manager [phone]     "

## 2024-05-08 NOTE — Patient Instructions (Signed)
 Visit Information  Thank you for taking time to visit with me today. Please don't hesitate to contact me if I can be of assistance to you before our next scheduled telephone appointment.  Our next appointment is by telephone on 05/15/24 in the morning  Following is a copy of your care plan:   Goals Addressed             This Visit's Progress    VBCI Transitions of Care (TOC) Care Plan       Problems:  Recent Hospitalization for treatment of Symptomatic anemia Functional/Safety concern: Fall risk discussed/education provided  Goal:  Over the next 30 days, the patient will not experience hospital readmission  Interventions:  Transitions of Care: Doctor Visits  - discussed the importance of doctor visits Communication with PCP, [name] re: medication reconciliation Arranged PCP follow-up within 7 days (TOC RN Scheduled) Message sent to PCP as follows: I scheduled for patient to see you 1/13 - she discharged 04/25/24 - Donepezil  10mg  on d/c not in cone - okay to add? Furosemide  20mg  prn ankle swelling on d/c not in cone - she takes midodrine  - is it okay to add Furosemide ? [facility] [employer] [place], also needs refill hydroxyzine  25mg  - d/c shows naloxegol oxalate 25mg  daily and Narcan - is it okay to add? duoneb not on dc patient asking for refill - she also needs Ondansetron  8mg  filled - response received with okay to add new and will sent Rx for refills Update 05/08/24: Patient states she is still recovering and states her PCP appt was rescheduled to 05/13/24 due to not having heat and having repairmen in the home day of previously scheduled appt 05/06/24 - Patient states heat was fixed. Patient denies any falls since the fall off toilet prior to admission. Reviewed safety precautions. Patient denies any signs of bleeding. Patient confirms she received the medications that PCP refilled. Patient reports constipation is not new and did move bowels 2 days ago and reports no  longer having nausea. Patient agreeable for f/u week fof 05/12/24 after PCP appt.   Anemia/Bleeding Interventions:  Assessment of understanding of anemia/bleeding disorder diagnosis  Basic overview and discussion of anemia/bleeding disorder or acute disease state  Medications reviewed  Counseled on avoidance of NSAIDs due to increased bleeding risk Provided education about signs and symptoms of active bleeding such as stomach discomfort, coughing up blood or blood tinged secretions, bleeding from the gums/teeth, nosebleeds, increased bruising, blood in the urine/stool and/or if a traumatic injury occurs, regardless of severity of injury  Advised to call provider or 911 if active bleeding or signs and symptoms of active bleeding occur encouraged strategies to prevent falls related to fatigue, weakness and dizziness; encouraged sitting before standing and using an assistive device Lab Results  Component Value Date   WBC 6.2 03/21/2023   HGB 11.1 03/21/2023   HCT 35.2 03/21/2023   MCV 88 03/21/2023   PLT 203 03/21/2023     Falls Interventions: Reviewed medications and discussed potential side effects of medications such as dizziness and frequent urination Advised patient of importance of notifying provider of falls   Pain Interventions: Pain assessment performed Medications reviewed Counseled on the importance of reporting any/all new or changed pain symptoms or management strategies to pain management provider Educated on importance of having family member know where Narcan is located and how to [employer]  Patient Self Care Activities:  Attend all scheduled provider appointments Call pharmacy for medication refills  3-7 days in advance of running out of medications Call provider office for new concerns or questions  Notify RN Care Manager of TOC call rescheduling needs Participate in Transition of Care Program/Attend TOC scheduled calls Take medications as prescribed   identify and  remove indoor air pollutants limit outdoor activity during cold weather listen for public air quality announcements every day Notify provider with any falls or return of symptoms related to anemia  Plan:  Telephone follow up appointment with care management team member scheduled for:  05/15/24 in the am The patient has been provided with contact information for the care management team and has been advised to call with any health related questions or concerns.         Patient verbalizes understanding of instructions and care plan provided today and agrees to view in MyChart. Active MyChart status and patient understanding of how to access instructions and care plan via MyChart confirmed with patient.     Telephone follow up appointment with care management team member scheduled for: 05/15/24 The patient has been provided with contact information for the care management team and has been advised to call with any health related questions or concerns.   Please call the care guide team at [phone] if you need to cancel or reschedule your appointment.   Please call the Suicide and Crisis Lifeline: 988 call 1-800-273-TALK (toll free, 24 hour hotline) call 911 if you are experiencing a Mental Health or Behavioral Health Crisis or need someone to talk to.  Shona Prow RN, CCM [place]  VBCI-Population Health RN Care Manager [phone]

## 2024-05-13 ENCOUNTER — Encounter: Payer: Self-pay | Admitting: Physician Assistant

## 2024-05-13 ENCOUNTER — Ambulatory Visit: Admitting: Physician Assistant

## 2024-05-13 VITALS — BP 109/48 | HR 75 | Ht 64.0 in | Wt 182.2 lb

## 2024-05-13 DIAGNOSIS — G3184 Mild cognitive impairment, so stated: Secondary | ICD-10-CM

## 2024-05-13 DIAGNOSIS — K922 Gastrointestinal hemorrhage, unspecified: Secondary | ICD-10-CM

## 2024-05-13 DIAGNOSIS — J411 Mucopurulent chronic bronchitis: Secondary | ICD-10-CM

## 2024-05-13 DIAGNOSIS — F332 Major depressive disorder, recurrent severe without psychotic features: Secondary | ICD-10-CM | POA: Diagnosis not present

## 2024-05-13 DIAGNOSIS — M25511 Pain in right shoulder: Secondary | ICD-10-CM

## 2024-05-13 DIAGNOSIS — R296 Repeated falls: Secondary | ICD-10-CM | POA: Diagnosis not present

## 2024-05-13 DIAGNOSIS — R29898 Other symptoms and signs involving the musculoskeletal system: Secondary | ICD-10-CM | POA: Diagnosis not present

## 2024-05-13 DIAGNOSIS — G894 Chronic pain syndrome: Secondary | ICD-10-CM | POA: Diagnosis not present

## 2024-05-13 DIAGNOSIS — F419 Anxiety disorder, unspecified: Secondary | ICD-10-CM | POA: Diagnosis not present

## 2024-05-13 DIAGNOSIS — R110 Nausea: Secondary | ICD-10-CM | POA: Diagnosis not present

## 2024-05-13 MED ORDER — DONEPEZIL HCL 10 MG PO TABS: 10.0000 mg | ORAL_TABLET | Freq: Every day | ORAL | 1 refills | Status: AC

## 2024-05-13 MED ORDER — DULOXETINE HCL 60 MG PO CPEP: 60.0000 mg | ORAL_CAPSULE | Freq: Two times a day (BID) | ORAL | 1 refills | Status: AC

## 2024-05-13 MED ORDER — BUPROPION HCL ER (XL) 150 MG PO TB24: 300.0000 mg | ORAL_TABLET | Freq: Every day | ORAL | 1 refills | Status: AC

## 2024-05-13 MED ORDER — IPRATROPIUM-ALBUTEROL 0.5-2.5 (3) MG/3ML IN SOLN: 3.0000 mL | RESPIRATORY_TRACT | 1 refills | Status: AC | PRN

## 2024-05-13 NOTE — Progress Notes (Signed)
 "  Established Sarah Wright Office Visit  Subjective   Sarah Wright ID: Sarah Wright, female    DOB: Aug 26, 1945  Age: 79 y.o. MRN: [id number]  Chief Complaint  Sarah Wright presents with   Hospitalization Follow-up    [facility]. - had two falls recently  first was 3 weeks ago and again one week ago - hit head and right shoulder -(shoulder still painful_ ) was found to have gastric bleed - given 2 blood transfusion.  Would like flu and covid vaccine if well enough today. - also states Oxycodone  15mg  is missing from current med list- also requests to remove all NSAIDs today as she can no longer take these.     79 year old female presents for a hospital follow-up after a fall on 01/01. She is accompanied by her daughter who helped with the history taking for HPI.   Sarah Wright was seen on 01/01 after falling in the bathroom while on the toilet. She denies head trauma, but reports that she fell forward. Daughter called an ambulance and the Sarah Wright was taken to the ER. She complained of R shoulder pain, R hip pain, bilateral knee pain, and increased SOB. X-rays performed in ER include chest, R shoulder, R hip, and bilateral knees. Imaging findings from ER visit:   IMPRESSION: 1. Chest: Cardiomegaly. No acute finding. 2. Right shoulder: DJD. No fracture. 3. Pelvis/right hip: Osteopenia, otherwise negative 4. Right knee: Severe medial compartment DJD. 5. Left knee: Severe medial and patellofemoral compartment DJD.  CBC was obtained on arrival and showed hemoglobin of 6.0 (baseline typically around 10-11). Iron studies confirmed iron deficiency anemia. Sarah Wright endorses intermittent rectal bleeding due to internal hemorrhoids, but has noticed an increase in amount and frequency over the past couple of months. She has been taking aspirin 81 mg and Aleve  multiple times a day for pain 3 for a couple of months prior to hospitalization. Upper endoscopy completed while hospitalized revealed two gastric ulcers, which  were cauterized. Sarah Wright received two blood transfusions and was discharged with Hgb trending upward at 7.4.   Since discharge on 01/02, the Sarah Wright reports improvement in her fatigue, but reports persistent nausea and a headache for which she has been taking tylenol  PRN. She has had one fall since discharge in which she fell out of her bed by sliding down the edge. She does not recall hitting her head, but has difficulty remembering what happened. Her R shoulder pain increased after the most recent fall.   She has discontinued Aspirin and Aleve  at this time and is only using acetaminophen  for otc pain relief at this time.       ROS See HPI   Objective:     BP (!) 109/48 (BP Location: Left Arm, Sarah Wright Position: Sitting, Cuff Size: Normal)   Pulse 75   Ht 5' 4 (1.626 m)   Wt 182 lb 4 oz (82.7 kg)   SpO2 100%   BMI 31.28 kg/m    Physical Exam Constitutional:      Appearance: Normal appearance.  HENT:     Head: Normocephalic and atraumatic.  Eyes:     Extraocular Movements: Extraocular movements intact.     Conjunctiva/sclera: Conjunctivae normal.  Cardiovascular:     Rate and Rhythm: Normal rate and regular rhythm.     Heart sounds: Normal heart sounds.  Pulmonary:     Effort: Pulmonary effort is normal.     Breath sounds: Normal breath sounds.  Musculoskeletal:     Right shoulder: Tenderness present. Decreased range of motion.  Left shoulder: Normal.     Comments: Negative drop arm sign. Strength 4/5, right arm.   Skin:    General: Skin is warm and dry.  Neurological:     Mental Status: She is alert and oriented to person, place, and time.  Psychiatric:        Mood and Affect: Mood normal.        Behavior: Behavior normal.            05/13/2024   10:25 AM 04/28/2024   10:22 AM 01/08/2024    2:06 PM  PHQ9 SCORE ONLY  PHQ-9 Total Score 17 2 1       05/13/2024   10:26 AM 04/28/2024   10:19 AM 12/19/2021   11:09 AM 08/20/2020   11:00 AM  GAD 7 :  Generalized Anxiety Score  Nervous, Anxious, on Edge 3 3  3   0   Control/stop worrying 3 3  3  1    Worry too much - different things 3 2  3  1    Trouble relaxing 3 2  3   0   Restless 3 2  3   0   Easily annoyed or irritable 2 1  2  1    Afraid - awful might happen 0 1  2  0   Total GAD 7 Score 17 14 19 3   Anxiety Difficulty Very difficult  Extremely difficult Somewhat difficult     Data saved with a previous flowsheet row definition       Assessment & Plan:  SABRASABRALuanne was seen today for hospitalization follow-up.  Diagnoses and all orders for this visit:  Gastrointestinal hemorrhage, unspecified gastrointestinal hemorrhage type -     CBC w/Diff/Platelet -     Fe+TIBC+Fer -     BMP8+eGFR -     Ambulatory referral to Gastroenterology  Mucopurulent chronic bronchitis (HCC) -     ipratropium-albuterol  (DUONEB) 0.5-2.5 (3) MG/3ML SOLN; Take 3 mLs by nebulization every 4 (four) hours as needed.  Nausea  Chronic pain syndrome -     DULoxetine  (CYMBALTA ) 60 MG capsule; Take 1 capsule (60 mg total) by mouth 2 (two) times daily.  Frequent falls -     Ambulatory referral to Home Health  Anxiety and depression -     buPROPion  (WELLBUTRIN  XL) 150 MG 24 hr tablet; Take 2 tablets (300 mg total) by mouth daily.  Severe episode of recurrent major depressive disorder, without psychotic features (HCC) -     DULoxetine  (CYMBALTA ) 60 MG capsule; Take 1 capsule (60 mg total) by mouth 2 (two) times daily.  Mild cognitive impairment -     donepezil  (ARICEPT ) 10 MG tablet; Take 1 tablet (10 mg total) by mouth at bedtime.  Acute pain of right shoulder -     Ambulatory referral to Home Health  Bilateral leg weakness -     Ambulatory referral to Home Health   -repeat CBC, iron panel, and BMP to assess for any downward trends; most recent Hgb 7.4 following 2 blood transfusions.  -continue to use tylenol  for otc pain relief and diclofenac gel  -discussed importance of refraining from NSAIDs  such as ibuprofen and naproxen  due to adverse effects including increased risk of developing more gastric ulcers  -educated Sarah Wright on the importance of physical activity to maintain independence and prevent adverse events such as DVT; discussed use of light resistance bands and foot pedaling for increased activity/strength  -encouraged movement of R shoulder to prevent adhesive capsulitis; suspect rotator cuff injury given no  bony abnormalities present on imaging on 01/01.  -referral sent for PT for possible rotator cuff injury of R shoulder and for general strength/fall prevention -continue pantoprazole to reduce risk of gastric ulcer and recurrence of GI bleed.  -discussed lowering oxycodone  to increase Sarah Wright's alertness throughout the day; Sarah Wright agreed to reduce mid-day dose to half of a tablet; consider continuing to taper if Sarah Wright can tolerate -PHQ9 and GAD not to goal; continue Wellbutrin  and Cymbalta  and consider alternative antidepressants and CBT therapy if score stays elevated or continues to increase.  -referral to GI placed for further management of peptic ulcers and internal hemorrhoids.   Keep regular 3 month pain contract visit.    Return if symptoms worsen or fail to improve.    Isaias Dowson, PA-C  "

## 2024-05-13 NOTE — Patient Instructions (Signed)
 Get labs Will order home PT Decrease oxycodone  1 tablet in am, 1/2 tablet at lunch, 1 tablet in pm.

## 2024-05-14 ENCOUNTER — Ambulatory Visit: Payer: Self-pay | Admitting: Physician Assistant

## 2024-05-14 LAB — CBC WITH DIFFERENTIAL/PLATELET
Basophils Absolute: 0.1 x10E3/uL (ref 0.0–0.2)
Basos: 1 %
EOS (ABSOLUTE): 0.3 x10E3/uL (ref 0.0–0.4)
Eos: 6 %
Hematocrit: 31.5 % — ABNORMAL LOW (ref 34.0–46.6)
Hemoglobin: 9 g/dL — ABNORMAL LOW (ref 11.1–15.9)
Immature Grans (Abs): 0 x10E3/uL (ref 0.0–0.1)
Immature Granulocytes: 0 %
Lymphocytes Absolute: 0.6 x10E3/uL — ABNORMAL LOW (ref 0.7–3.1)
Lymphs: 13 %
MCH: 20.5 pg — ABNORMAL LOW (ref 26.6–33.0)
MCHC: 28.6 g/dL — ABNORMAL LOW (ref 31.5–35.7)
MCV: 72 fL — ABNORMAL LOW (ref 79–97)
Monocytes Absolute: 0.4 x10E3/uL (ref 0.1–0.9)
Monocytes: 9 %
Neutrophils Absolute: 3.4 x10E3/uL (ref 1.4–7.0)
Neutrophils: 71 %
Platelets: 293 x10E3/uL (ref 150–450)
RBC: 4.38 x10E6/uL (ref 3.77–5.28)
RDW: 23.4 % — ABNORMAL HIGH (ref 11.7–15.4)
WBC: 4.8 x10E3/uL (ref 3.4–10.8)

## 2024-05-14 LAB — IRON,TIBC AND FERRITIN PANEL
Ferritin: 59 ng/mL (ref 15–150)
Iron Saturation: 12 % — ABNORMAL LOW (ref 15–55)
Iron: 36 ug/dL (ref 27–139)
Total Iron Binding Capacity: 292 ug/dL (ref 250–450)
UIBC: 256 ug/dL (ref 118–369)

## 2024-05-14 LAB — BMP8+EGFR
BUN/Creatinine Ratio: 18 (ref 12–28)
BUN: 14 mg/dL (ref 8–27)
CO2: 26 mmol/L (ref 20–29)
Calcium: 9.3 mg/dL (ref 8.7–10.3)
Chloride: 101 mmol/L (ref 96–106)
Creatinine, Ser: 0.77 mg/dL (ref 0.57–1.00)
Glucose: 90 mg/dL (ref 70–99)
Potassium: 4.4 mmol/L (ref 3.5–5.2)
Sodium: 141 mmol/L (ref 134–144)
eGFR: 78 mL/min/1.73

## 2024-05-14 NOTE — Progress Notes (Signed)
 Sarah Wright,   Your hemoglobin trending up from hospital at 9.0 today.  Labs look good.

## 2024-05-22 ENCOUNTER — Other Ambulatory Visit: Payer: Self-pay | Admitting: Physician Assistant

## 2024-05-22 NOTE — Telephone Encounter (Signed)
 Pharmacy requesting furosemide  20mg  be written as 90 day supply  Last written 04/28/2024 as 30 day supply Last OV 05/13/2024 Upcoming appt 01/08/2025 AWV

## 2024-05-23 ENCOUNTER — Other Ambulatory Visit (HOSPITAL_COMMUNITY): Payer: Self-pay

## 2024-05-23 ENCOUNTER — Other Ambulatory Visit: Payer: Self-pay | Admitting: Physician Assistant

## 2024-05-23 DIAGNOSIS — G2581 Restless legs syndrome: Secondary | ICD-10-CM

## 2024-05-26 ENCOUNTER — Telehealth: Payer: Self-pay

## 2024-05-26 NOTE — Telephone Encounter (Signed)
 Caller/Agency: Arvella with [facility]    Callback Number: [phone] secure line    Service Requested: Physical Therapy    Frequency: 2x wk for 3 wks, 1x wk for 3 wks  Any new concerns about the patient? No-   standing, balance and gait training   Patient Information  Patient Name Gender DOB SSN  Sarah Wright, Sarah Wright Female [birth year] [id number]

## 2024-05-27 NOTE — Telephone Encounter (Signed)
 Sarah Wright with [facility] is calling back to check on the verbal orders for Physical Therapy. I did advised that the message has been sent to the provider. Per Arvella it is ok to leave a message on his voice mail, it is a secure line [phone].   Frequency: 2x3 1x3

## 2024-05-27 NOTE — Telephone Encounter (Signed)
 Task completed. Contacted Brad @ [facility] regarding the home physical therapy. No answer. Left a detailed vm msg authorizing the verbal order for PT 2x/3 wks, 1x/3 wks. Direct call back information provided.

## 2024-05-30 ENCOUNTER — Other Ambulatory Visit (HOSPITAL_COMMUNITY): Payer: Self-pay

## 2025-01-08 ENCOUNTER — Ambulatory Visit
